# Patient Record
Sex: Male | Born: 1960 | Race: White | Hispanic: No | Marital: Married | Smoking: Former smoker
Health system: Southern US, Community
[De-identification: ages and names within clinical notes are randomized; demographics above are authoritative.]

## PROBLEM LIST (undated history)

## (undated) DIAGNOSIS — F1010 Alcohol abuse, uncomplicated: Secondary | ICD-10-CM

## (undated) DIAGNOSIS — R569 Unspecified convulsions: Secondary | ICD-10-CM

## (undated) DIAGNOSIS — I8500 Esophageal varices without bleeding: Secondary | ICD-10-CM

## (undated) DIAGNOSIS — Z8719 Personal history of other diseases of the digestive system: Secondary | ICD-10-CM

## (undated) DIAGNOSIS — K219 Gastro-esophageal reflux disease without esophagitis: Secondary | ICD-10-CM

## (undated) DIAGNOSIS — I700 Atherosclerosis of aorta: Secondary | ICD-10-CM

## (undated) DIAGNOSIS — K7030 Alcoholic cirrhosis of liver without ascites: Secondary | ICD-10-CM

## (undated) DIAGNOSIS — H9191 Unspecified hearing loss, right ear: Secondary | ICD-10-CM

## (undated) DIAGNOSIS — Z87898 Personal history of other specified conditions: Secondary | ICD-10-CM

## (undated) DIAGNOSIS — A048 Other specified bacterial intestinal infections: Secondary | ICD-10-CM

## (undated) DIAGNOSIS — U071 COVID-19: Secondary | ICD-10-CM

## (undated) DIAGNOSIS — M1990 Unspecified osteoarthritis, unspecified site: Secondary | ICD-10-CM

## (undated) DIAGNOSIS — F1020 Alcohol dependence, uncomplicated: Secondary | ICD-10-CM

## (undated) DIAGNOSIS — R188 Other ascites: Secondary | ICD-10-CM

## (undated) DIAGNOSIS — R748 Abnormal levels of other serum enzymes: Secondary | ICD-10-CM

## (undated) DIAGNOSIS — F419 Anxiety disorder, unspecified: Secondary | ICD-10-CM

## (undated) DIAGNOSIS — E669 Obesity, unspecified: Secondary | ICD-10-CM

## (undated) DIAGNOSIS — F32A Depression, unspecified: Secondary | ICD-10-CM

## (undated) DIAGNOSIS — I10 Essential (primary) hypertension: Secondary | ICD-10-CM

## (undated) DIAGNOSIS — E881 Lipodystrophy, not elsewhere classified: Secondary | ICD-10-CM

## (undated) DIAGNOSIS — M109 Gout, unspecified: Secondary | ICD-10-CM

## (undated) DIAGNOSIS — I639 Cerebral infarction, unspecified: Secondary | ICD-10-CM

## (undated) DIAGNOSIS — K859 Acute pancreatitis without necrosis or infection, unspecified: Secondary | ICD-10-CM

## (undated) DIAGNOSIS — IMO0001 Reserved for inherently not codable concepts without codable children: Secondary | ICD-10-CM

## (undated) DIAGNOSIS — E119 Type 2 diabetes mellitus without complications: Secondary | ICD-10-CM

## (undated) DIAGNOSIS — K4090 Unilateral inguinal hernia, without obstruction or gangrene, not specified as recurrent: Secondary | ICD-10-CM

## (undated) DIAGNOSIS — R0600 Dyspnea, unspecified: Secondary | ICD-10-CM

## (undated) DIAGNOSIS — I864 Gastric varices: Secondary | ICD-10-CM

## (undated) DIAGNOSIS — E871 Hypo-osmolality and hyponatremia: Secondary | ICD-10-CM

## (undated) DIAGNOSIS — Z87442 Personal history of urinary calculi: Secondary | ICD-10-CM

## (undated) DIAGNOSIS — D509 Iron deficiency anemia, unspecified: Secondary | ICD-10-CM

## (undated) DIAGNOSIS — K861 Other chronic pancreatitis: Secondary | ICD-10-CM

## (undated) DIAGNOSIS — K2970 Gastritis, unspecified, without bleeding: Secondary | ICD-10-CM

## (undated) DIAGNOSIS — C801 Malignant (primary) neoplasm, unspecified: Secondary | ICD-10-CM

## (undated) DIAGNOSIS — K635 Polyp of colon: Secondary | ICD-10-CM

## (undated) DIAGNOSIS — Z531 Procedure and treatment not carried out because of patient's decision for reasons of belief and group pressure: Secondary | ICD-10-CM

## (undated) DIAGNOSIS — M5030 Other cervical disc degeneration, unspecified cervical region: Secondary | ICD-10-CM

## (undated) DIAGNOSIS — R1310 Dysphagia, unspecified: Secondary | ICD-10-CM

## (undated) DIAGNOSIS — K209 Esophagitis, unspecified without bleeding: Secondary | ICD-10-CM

## (undated) DIAGNOSIS — E559 Vitamin D deficiency, unspecified: Secondary | ICD-10-CM

## (undated) DIAGNOSIS — R161 Splenomegaly, not elsewhere classified: Secondary | ICD-10-CM

## (undated) DIAGNOSIS — E875 Hyperkalemia: Secondary | ICD-10-CM

## (undated) DIAGNOSIS — E785 Hyperlipidemia, unspecified: Secondary | ICD-10-CM

## (undated) DIAGNOSIS — K439 Ventral hernia without obstruction or gangrene: Secondary | ICD-10-CM

## (undated) DIAGNOSIS — I82890 Acute embolism and thrombosis of other specified veins: Secondary | ICD-10-CM

## (undated) DIAGNOSIS — N529 Male erectile dysfunction, unspecified: Secondary | ICD-10-CM

## (undated) HISTORY — PX: COLONOSCOPY WITH ESOPHAGOGASTRODUODENOSCOPY (EGD): SHX5779

## (undated) HISTORY — PX: APPENDECTOMY: SHX54

## (undated) HISTORY — DX: Cerebral infarction, unspecified: I63.9

## (undated) HISTORY — PX: TONSILLECTOMY: SUR1361

## (undated) HISTORY — DX: Hyperlipidemia, unspecified: E78.5

## (undated) HISTORY — PX: HERNIA REPAIR: SHX51

## (undated) HISTORY — DX: Procedure and treatment not carried out because of patient's decision for reasons of belief and group pressure: Z53.1

---

## 1998-07-09 DIAGNOSIS — I10 Essential (primary) hypertension: Secondary | ICD-10-CM | POA: Insufficient documentation

## 2001-05-09 ENCOUNTER — Other Ambulatory Visit: Admission: RE | Admit: 2001-05-09 | Discharge: 2001-05-09 | Payer: Self-pay | Admitting: Gastroenterology

## 2004-07-09 HISTORY — PX: CHOLECYSTECTOMY: SHX55

## 2004-11-22 ENCOUNTER — Ambulatory Visit: Payer: Self-pay | Admitting: Family Medicine

## 2005-01-25 ENCOUNTER — Ambulatory Visit: Payer: Self-pay | Admitting: Internal Medicine

## 2005-07-09 DIAGNOSIS — E669 Obesity, unspecified: Secondary | ICD-10-CM | POA: Insufficient documentation

## 2006-04-10 ENCOUNTER — Ambulatory Visit: Payer: Self-pay | Admitting: Family Medicine

## 2006-07-14 ENCOUNTER — Emergency Department: Payer: Self-pay | Admitting: Emergency Medicine

## 2006-07-22 ENCOUNTER — Ambulatory Visit: Payer: Self-pay | Admitting: Family Medicine

## 2007-05-13 DIAGNOSIS — N529 Male erectile dysfunction, unspecified: Secondary | ICD-10-CM | POA: Insufficient documentation

## 2007-05-13 DIAGNOSIS — F329 Major depressive disorder, single episode, unspecified: Secondary | ICD-10-CM

## 2007-05-13 DIAGNOSIS — F32A Depression, unspecified: Secondary | ICD-10-CM | POA: Insufficient documentation

## 2007-07-14 ENCOUNTER — Other Ambulatory Visit: Payer: Self-pay

## 2007-07-14 ENCOUNTER — Ambulatory Visit: Payer: Self-pay | Admitting: Family Medicine

## 2007-07-14 ENCOUNTER — Inpatient Hospital Stay: Payer: Self-pay | Admitting: *Deleted

## 2007-07-22 ENCOUNTER — Emergency Department: Payer: Self-pay | Admitting: Internal Medicine

## 2007-07-22 ENCOUNTER — Other Ambulatory Visit: Payer: Self-pay

## 2007-09-10 DIAGNOSIS — E881 Lipodystrophy, not elsewhere classified: Secondary | ICD-10-CM | POA: Insufficient documentation

## 2007-09-10 DIAGNOSIS — M109 Gout, unspecified: Secondary | ICD-10-CM | POA: Insufficient documentation

## 2007-10-01 DIAGNOSIS — E1165 Type 2 diabetes mellitus with hyperglycemia: Secondary | ICD-10-CM | POA: Insufficient documentation

## 2007-10-01 DIAGNOSIS — E119 Type 2 diabetes mellitus without complications: Secondary | ICD-10-CM

## 2009-08-19 DIAGNOSIS — E559 Vitamin D deficiency, unspecified: Secondary | ICD-10-CM | POA: Insufficient documentation

## 2011-08-06 ENCOUNTER — Ambulatory Visit: Payer: Self-pay | Admitting: Family Medicine

## 2011-08-17 DIAGNOSIS — M5030 Other cervical disc degeneration, unspecified cervical region: Secondary | ICD-10-CM | POA: Insufficient documentation

## 2012-08-01 ENCOUNTER — Inpatient Hospital Stay: Payer: Self-pay | Admitting: Internal Medicine

## 2012-08-01 ENCOUNTER — Other Ambulatory Visit: Payer: Self-pay | Admitting: Family Medicine

## 2012-08-01 LAB — CBC WITH DIFFERENTIAL/PLATELET
Basophil #: 0.1 10*3/uL (ref 0.0–0.1)
Basophil %: 0.7 %
Eosinophil #: 0.1 10*3/uL (ref 0.0–0.7)
Eosinophil %: 0.5 %
Lymphocyte #: 0.9 10*3/uL — ABNORMAL LOW (ref 1.0–3.6)
Lymphocyte %: 5.5 %
MCH: 33.2 pg (ref 26.0–34.0)
MCHC: 34.2 g/dL (ref 32.0–36.0)
MCV: 97 fL (ref 80–100)
Monocyte %: 8.3 %
Neutrophil #: 14.4 10*3/uL — ABNORMAL HIGH (ref 1.4–6.5)
RBC: 4.66 10*6/uL (ref 4.40–5.90)
RDW: 12.9 % (ref 11.5–14.5)
WBC: 17 10*3/uL — ABNORMAL HIGH (ref 3.8–10.6)

## 2012-08-01 LAB — HEPATIC FUNCTION PANEL A (ARMC)
Albumin: 3.6 g/dL (ref 3.4–5.0)
Bilirubin,Total: 1.1 mg/dL — ABNORMAL HIGH (ref 0.2–1.0)
SGPT (ALT): 30 U/L (ref 12–78)
Total Protein: 8.1 g/dL (ref 6.4–8.2)

## 2012-08-01 LAB — URINALYSIS, COMPLETE
Bacteria: NONE SEEN
Blood: NEGATIVE
Glucose,UR: >=500 mg/dL (ref 0–75)
Leukocyte Esterase: NEGATIVE
Ph: 6 (ref 4.5–8.0)
Protein: NEGATIVE
Specific Gravity: 1.039 (ref 1.003–1.030)

## 2012-08-01 LAB — BASIC METABOLIC PANEL
Calcium, Total: 9 mg/dL (ref 8.5–10.1)
Chloride: 87 mmol/L — ABNORMAL LOW (ref 98–107)
Co2: 31 mmol/L (ref 21–32)
Glucose: 413 mg/dL — ABNORMAL HIGH (ref 65–99)
Potassium: 3.6 mmol/L (ref 3.5–5.1)
Sodium: 126 mmol/L — ABNORMAL LOW (ref 136–145)

## 2012-08-01 LAB — CK TOTAL AND CKMB (NOT AT ARMC): CK, Total: 16 U/L — ABNORMAL LOW (ref 35–232)

## 2012-08-01 LAB — LIPASE, BLOOD: Lipase: 942 U/L — ABNORMAL HIGH (ref 73–393)

## 2012-08-01 LAB — TROPONIN I: Troponin-I: <0.02 ng/mL

## 2012-08-02 LAB — CBC WITH DIFFERENTIAL/PLATELET
Basophil #: 0 10*3/uL (ref 0.0–0.1)
Eosinophil #: 0.2 10*3/uL (ref 0.0–0.7)
Eosinophil %: 1.5 %
HCT: 39.5 % — ABNORMAL LOW (ref 40.0–52.0)
HGB: 14.1 g/dL (ref 13.0–18.0)
MCHC: 35.6 g/dL (ref 32.0–36.0)
Monocyte #: 1.5 x10 3/mm — ABNORMAL HIGH (ref 0.2–1.0)
Neutrophil %: 74.7 %
WBC: 12.4 10*3/uL — ABNORMAL HIGH (ref 3.8–10.6)

## 2012-08-02 LAB — COMPREHENSIVE METABOLIC PANEL
Anion Gap: 9 (ref 7–16)
Bilirubin,Total: 0.8 mg/dL (ref 0.2–1.0)
Calcium, Total: 8.8 mg/dL (ref 8.5–10.1)
Chloride: 94 mmol/L — ABNORMAL LOW (ref 98–107)
Creatinine: 0.59 mg/dL — ABNORMAL LOW (ref 0.60–1.30)
EGFR (African American): >60
EGFR (Non-African Amer.): >60
Glucose: 157 mg/dL — ABNORMAL HIGH (ref 65–99)
Osmolality: 269 (ref 275–301)
Potassium: 4.4 mmol/L (ref 3.5–5.1)
SGPT (ALT): 26 U/L (ref 12–78)
Sodium: 134 mmol/L — ABNORMAL LOW (ref 136–145)
Total Protein: 6.9 g/dL (ref 6.4–8.2)

## 2012-08-02 LAB — LIPASE, BLOOD: Lipase: 322 U/L (ref 73–393)

## 2012-08-03 LAB — SODIUM: Sodium: 137 mmol/L (ref 136–145)

## 2012-09-06 ENCOUNTER — Inpatient Hospital Stay: Payer: Self-pay | Admitting: Internal Medicine

## 2012-09-06 LAB — URINALYSIS, COMPLETE
Bacteria: NONE SEEN
Bilirubin,UR: NEGATIVE
Glucose,UR: >=500 mg/dL (ref 0–75)
Ph: 5 (ref 4.5–8.0)
Protein: NEGATIVE
RBC,UR: NONE SEEN /HPF (ref 0–5)
WBC UR: <1 /HPF (ref 0–5)

## 2012-09-06 LAB — COMPREHENSIVE METABOLIC PANEL
Albumin: 3.6 g/dL (ref 3.4–5.0)
Alkaline Phosphatase: 164 U/L — ABNORMAL HIGH (ref 50–136)
BUN: 29 mg/dL — ABNORMAL HIGH (ref 7–18)
Bilirubin,Total: 1.6 mg/dL — ABNORMAL HIGH (ref 0.2–1.0)
EGFR (African American): >60
EGFR (Non-African Amer.): >60
Glucose: 611 mg/dL (ref 65–99)
Potassium: 4.3 mmol/L (ref 3.5–5.1)
Sodium: 121 mmol/L — ABNORMAL LOW (ref 136–145)
Total Protein: 7.6 g/dL (ref 6.4–8.2)

## 2012-09-06 LAB — CBC
HCT: 40.2 % (ref 40.0–52.0)
MCH: 32.6 pg (ref 26.0–34.0)
MCHC: 34.5 g/dL (ref 32.0–36.0)
MCV: 94 fL (ref 80–100)
Platelet: 205 10*3/uL (ref 150–440)
RDW: 13.1 % (ref 11.5–14.5)

## 2012-09-06 LAB — HEMOGLOBIN A1C: Hemoglobin A1C: 12.2 % — ABNORMAL HIGH (ref 4.2–6.3)

## 2012-09-06 LAB — AMYLASE: Amylase: 58 U/L (ref 25–115)

## 2012-09-07 LAB — CBC WITH DIFFERENTIAL/PLATELET
Basophil #: 0.1 10*3/uL (ref 0.0–0.1)
Eosinophil %: 9.4 %
HGB: 12.7 g/dL — ABNORMAL LOW (ref 13.0–18.0)
Lymphocyte #: 1.4 10*3/uL (ref 1.0–3.6)
MCV: 92 fL (ref 80–100)
Monocyte %: 11.5 %
Neutrophil #: 3.9 10*3/uL (ref 1.4–6.5)
Platelet: 183 10*3/uL (ref 150–440)
RBC: 3.92 10*6/uL — ABNORMAL LOW (ref 4.40–5.90)
RDW: 13.1 % (ref 11.5–14.5)
WBC: 6.8 10*3/uL (ref 3.8–10.6)

## 2012-09-07 LAB — LIPID PANEL: Triglycerides: 116 mg/dL (ref 0–200)

## 2012-09-07 LAB — BASIC METABOLIC PANEL
Anion Gap: 6 — ABNORMAL LOW (ref 7–16)
Co2: 32 mmol/L (ref 21–32)
Creatinine: 0.65 mg/dL (ref 0.60–1.30)
Glucose: 175 mg/dL — ABNORMAL HIGH (ref 65–99)

## 2012-09-07 LAB — HEMOGLOBIN A1C: Hemoglobin A1C: 11.3 % — ABNORMAL HIGH (ref 4.2–6.3)

## 2012-09-08 LAB — BASIC METABOLIC PANEL
Anion Gap: 4 — ABNORMAL LOW (ref 7–16)
Chloride: 100 mmol/L (ref 98–107)
Creatinine: 0.64 mg/dL (ref 0.60–1.30)
EGFR (African American): >60
EGFR (Non-African Amer.): >60
Glucose: 179 mg/dL — ABNORMAL HIGH (ref 65–99)
Osmolality: 273 (ref 275–301)
Potassium: 3.9 mmol/L (ref 3.5–5.1)
Sodium: 135 mmol/L — ABNORMAL LOW (ref 136–145)

## 2012-09-08 LAB — MAGNESIUM: Magnesium: 1.6 mg/dL — ABNORMAL LOW

## 2012-09-09 DIAGNOSIS — I8500 Esophageal varices without bleeding: Secondary | ICD-10-CM | POA: Insufficient documentation

## 2012-09-09 LAB — BASIC METABOLIC PANEL
Anion Gap: 3 — ABNORMAL LOW (ref 7–16)
BUN: 8 mg/dL (ref 7–18)
Calcium, Total: 9.1 mg/dL (ref 8.5–10.1)
Chloride: 101 mmol/L (ref 98–107)
Co2: 31 mmol/L (ref 21–32)
Creatinine: 0.7 mg/dL (ref 0.60–1.30)
Potassium: 3.6 mmol/L (ref 3.5–5.1)

## 2012-09-09 LAB — CEA: CEA: 2 ng/mL (ref 0.0–4.7)

## 2012-09-10 LAB — PATHOLOGY REPORT

## 2012-09-23 ENCOUNTER — Ambulatory Visit: Payer: Self-pay | Admitting: Unknown Physician Specialty

## 2012-09-23 DIAGNOSIS — Z86010 Personal history of colonic polyps: Secondary | ICD-10-CM | POA: Insufficient documentation

## 2012-09-24 LAB — PATHOLOGY REPORT

## 2013-02-26 ENCOUNTER — Ambulatory Visit: Payer: Self-pay | Admitting: Gastroenterology

## 2013-07-20 ENCOUNTER — Ambulatory Visit: Payer: Self-pay | Admitting: Surgery

## 2013-07-20 LAB — CBC WITH DIFFERENTIAL/PLATELET
BASOS ABS: 0.1 10*3/uL (ref 0.0–0.1)
Basophil %: 0.9 %
Eosinophil #: 0.2 10*3/uL (ref 0.0–0.7)
Eosinophil %: 3.7 %
HCT: 43.3 % (ref 40.0–52.0)
HGB: 14.9 g/dL (ref 13.0–18.0)
Lymphocyte #: 1 10*3/uL (ref 1.0–3.6)
Lymphocyte %: 14.6 %
MCH: 31.9 pg (ref 26.0–34.0)
MCHC: 34.3 g/dL (ref 32.0–36.0)
MCV: 93 fL (ref 80–100)
MONOS PCT: 9.4 %
Monocyte #: 0.6 x10 3/mm (ref 0.2–1.0)
Neutrophil #: 4.7 10*3/uL (ref 1.4–6.5)
Neutrophil %: 71.4 %
PLATELETS: 215 10*3/uL (ref 150–440)
RBC: 4.66 10*6/uL (ref 4.40–5.90)
RDW: 15.5 % — AB (ref 11.5–14.5)
WBC: 6.6 10*3/uL (ref 3.8–10.6)

## 2013-07-20 LAB — BASIC METABOLIC PANEL
Anion Gap: 6 — ABNORMAL LOW (ref 7–16)
BUN: 14 mg/dL (ref 7–18)
CALCIUM: 9.3 mg/dL (ref 8.5–10.1)
CHLORIDE: 99 mmol/L (ref 98–107)
CREATININE: 0.65 mg/dL (ref 0.60–1.30)
Co2: 32 mmol/L (ref 21–32)
EGFR (African American): >60
EGFR (Non-African Amer.): >60
Glucose: 179 mg/dL — ABNORMAL HIGH (ref 65–99)
OSMOLALITY: 279 (ref 275–301)
POTASSIUM: 4.3 mmol/L (ref 3.5–5.1)
Sodium: 137 mmol/L (ref 136–145)

## 2013-07-20 LAB — PROTIME-INR
INR: 1.1
PROTHROMBIN TIME: 14 s (ref 11.5–14.7)

## 2013-07-28 ENCOUNTER — Ambulatory Visit: Payer: Self-pay | Admitting: Surgery

## 2013-07-28 HISTORY — PX: HERNIA REPAIR: SHX51

## 2013-09-24 ENCOUNTER — Ambulatory Visit: Payer: Self-pay | Admitting: Gastroenterology

## 2013-11-06 ENCOUNTER — Ambulatory Visit: Payer: Self-pay | Admitting: Gastroenterology

## 2013-11-06 LAB — CBC WITH DIFFERENTIAL/PLATELET
BASOS PCT: 1.8 %
Basophil #: 0.1 10*3/uL (ref 0.0–0.1)
Eosinophil #: 0.2 10*3/uL (ref 0.0–0.7)
Eosinophil %: 5.1 %
HCT: 25.8 % — AB (ref 40.0–52.0)
HGB: 8.2 g/dL — ABNORMAL LOW (ref 13.0–18.0)
LYMPHS PCT: 19 %
Lymphocyte #: 0.8 10*3/uL — ABNORMAL LOW (ref 1.0–3.6)
MCH: 24.1 pg — AB (ref 26.0–34.0)
MCHC: 31.8 g/dL — ABNORMAL LOW (ref 32.0–36.0)
MCV: 76 fL — ABNORMAL LOW (ref 80–100)
Monocyte #: 0.6 x10 3/mm (ref 0.2–1.0)
Monocyte %: 13.1 %
NEUTROS PCT: 61 %
Neutrophil #: 2.6 10*3/uL (ref 1.4–6.5)
Platelet: 254 10*3/uL (ref 150–440)
RBC: 3.39 10*6/uL — AB (ref 4.40–5.90)
RDW: 22.8 % — AB (ref 11.5–14.5)
WBC: 4.3 10*3/uL (ref 3.8–10.6)

## 2013-11-06 LAB — HM COLONOSCOPY

## 2013-11-06 LAB — PROTIME-INR
INR: 1.1
PROTHROMBIN TIME: 14.4 s (ref 11.5–14.7)

## 2013-11-06 LAB — KOH PREP

## 2013-11-09 LAB — PATHOLOGY REPORT

## 2013-12-08 DIAGNOSIS — K209 Esophagitis, unspecified without bleeding: Secondary | ICD-10-CM | POA: Insufficient documentation

## 2013-12-08 DIAGNOSIS — K2970 Gastritis, unspecified, without bleeding: Secondary | ICD-10-CM | POA: Insufficient documentation

## 2013-12-08 DIAGNOSIS — R748 Abnormal levels of other serum enzymes: Secondary | ICD-10-CM | POA: Insufficient documentation

## 2013-12-08 DIAGNOSIS — K861 Other chronic pancreatitis: Secondary | ICD-10-CM | POA: Insufficient documentation

## 2013-12-09 ENCOUNTER — Ambulatory Visit: Payer: Self-pay | Admitting: Internal Medicine

## 2013-12-09 ENCOUNTER — Ambulatory Visit: Payer: Self-pay | Admitting: Gastroenterology

## 2013-12-09 LAB — CBC CANCER CENTER
Basophil #: 0.1 x10 3/mm (ref 0.0–0.1)
Basophil %: 1.6 %
Eosinophil #: 0.2 x10 3/mm (ref 0.0–0.7)
Eosinophil %: 3.9 %
HCT: 24.8 % — AB (ref 40.0–52.0)
HGB: 7.3 g/dL — AB (ref 13.0–18.0)
Lymphocyte #: 0.7 x10 3/mm — ABNORMAL LOW (ref 1.0–3.6)
Lymphocyte %: 11.5 %
MCH: 19.9 pg — ABNORMAL LOW (ref 26.0–34.0)
MCHC: 29.3 g/dL — ABNORMAL LOW (ref 32.0–36.0)
MCV: 68 fL — AB (ref 80–100)
MONO ABS: 0.9 x10 3/mm (ref 0.2–1.0)
Monocyte %: 13.6 %
Neutrophil #: 4.4 x10 3/mm (ref 1.4–6.5)
Neutrophil %: 69.4 %
Platelet: 284 x10 3/mm (ref 150–440)
RBC: 3.65 10*6/uL — ABNORMAL LOW (ref 4.40–5.90)
RDW: 20.2 % — ABNORMAL HIGH (ref 11.5–14.5)
WBC: 6.3 x10 3/mm (ref 3.8–10.6)

## 2013-12-09 LAB — FERRITIN: Ferritin (ARMC): 16 ng/mL (ref 8–388)

## 2013-12-15 ENCOUNTER — Ambulatory Visit: Payer: Self-pay | Admitting: Gastroenterology

## 2013-12-16 LAB — CBC CANCER CENTER
BASOS ABS: 0.1 x10 3/mm (ref 0.0–0.1)
Basophil %: 0.9 %
EOS PCT: 4.2 %
Eosinophil #: 0.5 x10 3/mm (ref 0.0–0.7)
HCT: 31 % — ABNORMAL LOW (ref 40.0–52.0)
HGB: 9.3 g/dL — ABNORMAL LOW (ref 13.0–18.0)
LYMPHS ABS: 1.3 x10 3/mm (ref 1.0–3.6)
Lymphocyte %: 12.3 %
MCH: 21.9 pg — ABNORMAL LOW (ref 26.0–34.0)
MCHC: 30.1 g/dL — AB (ref 32.0–36.0)
MCV: 73 fL — AB (ref 80–100)
MONO ABS: 1.2 x10 3/mm — AB (ref 0.2–1.0)
MONOS PCT: 11.1 %
NEUTROS PCT: 71.5 %
Neutrophil #: 7.8 x10 3/mm — ABNORMAL HIGH (ref 1.4–6.5)
Platelet: 275 x10 3/mm (ref 150–440)
RBC: 4.25 10*6/uL — ABNORMAL LOW (ref 4.40–5.90)
RDW: 24.2 % — ABNORMAL HIGH (ref 11.5–14.5)
WBC: 10.9 x10 3/mm — ABNORMAL HIGH (ref 3.8–10.6)

## 2013-12-23 LAB — FERRITIN: FERRITIN (ARMC): 47 ng/mL (ref 8–388)

## 2013-12-23 LAB — CBC CANCER CENTER
Basophil #: 0.2 x10 3/mm — ABNORMAL HIGH (ref 0.0–0.1)
Basophil %: 2.2 %
Eosinophil #: 0.4 x10 3/mm (ref 0.0–0.7)
Eosinophil %: 5.3 %
HCT: 30.1 % — ABNORMAL LOW (ref 40.0–52.0)
HGB: 9.2 g/dL — ABNORMAL LOW (ref 13.0–18.0)
Lymphocyte #: 1.2 x10 3/mm (ref 1.0–3.6)
Lymphocyte %: 17.4 %
MCH: 22 pg — ABNORMAL LOW (ref 26.0–34.0)
MCHC: 30.4 g/dL — ABNORMAL LOW (ref 32.0–36.0)
MCV: 72 fL — ABNORMAL LOW (ref 80–100)
MONO ABS: 0.8 x10 3/mm (ref 0.2–1.0)
MONOS PCT: 11.6 %
Neutrophil #: 4.4 x10 3/mm (ref 1.4–6.5)
Neutrophil %: 63.5 %
Platelet: 382 x10 3/mm (ref 150–440)
RBC: 4.17 10*6/uL — ABNORMAL LOW (ref 4.40–5.90)
RDW: 24.9 % — ABNORMAL HIGH (ref 11.5–14.5)
WBC: 7 x10 3/mm (ref 3.8–10.6)

## 2013-12-25 ENCOUNTER — Ambulatory Visit: Payer: Self-pay | Admitting: Gastroenterology

## 2013-12-30 LAB — CANCER CENTER HEMOGLOBIN: HGB: 9.8 g/dL — ABNORMAL LOW (ref 13.0–18.0)

## 2013-12-30 LAB — FERRITIN: Ferritin (ARMC): 48 ng/mL (ref 8–388)

## 2014-01-06 ENCOUNTER — Ambulatory Visit: Payer: Self-pay | Admitting: Internal Medicine

## 2014-01-06 LAB — CANCER CENTER HEMOGLOBIN: HGB: 10.3 g/dL — ABNORMAL LOW (ref 13.0–18.0)

## 2014-01-06 LAB — FERRITIN: Ferritin (ARMC): 40 ng/mL (ref 8–388)

## 2014-01-20 LAB — CANCER CENTER HEMOGLOBIN: HGB: 11.4 g/dL — AB (ref 13.0–18.0)

## 2014-01-20 LAB — FERRITIN: Ferritin (ARMC): 111 ng/mL (ref 8–388)

## 2014-02-06 ENCOUNTER — Ambulatory Visit: Payer: Self-pay | Admitting: Internal Medicine

## 2014-02-10 DIAGNOSIS — R1310 Dysphagia, unspecified: Secondary | ICD-10-CM | POA: Insufficient documentation

## 2014-02-11 DIAGNOSIS — D689 Coagulation defect, unspecified: Secondary | ICD-10-CM | POA: Insufficient documentation

## 2014-02-23 ENCOUNTER — Other Ambulatory Visit: Payer: Self-pay | Admitting: Family Medicine

## 2014-02-23 LAB — COMPREHENSIVE METABOLIC PANEL
ALBUMIN: 3.6 g/dL (ref 3.4–5.0)
ANION GAP: 8 (ref 7–16)
Alkaline Phosphatase: 177 U/L — ABNORMAL HIGH
BUN: 10 mg/dL (ref 7–18)
Bilirubin,Total: 1.5 mg/dL — ABNORMAL HIGH (ref 0.2–1.0)
CALCIUM: 9.3 mg/dL (ref 8.5–10.1)
CREATININE: 0.99 mg/dL (ref 0.60–1.30)
Chloride: 96 mmol/L — ABNORMAL LOW (ref 98–107)
Co2: 33 mmol/L — ABNORMAL HIGH (ref 21–32)
GLUCOSE: 191 mg/dL — AB (ref 65–99)
Osmolality: 278 (ref 275–301)
Potassium: 5.2 mmol/L — ABNORMAL HIGH (ref 3.5–5.1)
SGOT(AST): 35 U/L (ref 15–37)
SGPT (ALT): 18 U/L
SODIUM: 137 mmol/L (ref 136–145)
Total Protein: 7.8 g/dL (ref 6.4–8.2)

## 2014-02-23 LAB — CBC WITH DIFFERENTIAL/PLATELET
BASOS PCT: 0.4 %
Basophil #: 0.1 10*3/uL (ref 0.0–0.1)
EOS ABS: 0 10*3/uL (ref 0.0–0.7)
Eosinophil %: 0.1 %
HCT: 44.7 % (ref 40.0–52.0)
HGB: 14.5 g/dL (ref 13.0–18.0)
LYMPHS ABS: 0.7 10*3/uL — AB (ref 1.0–3.6)
Lymphocyte %: 4.3 %
MCH: 27.8 pg (ref 26.0–34.0)
MCHC: 32.4 g/dL (ref 32.0–36.0)
MCV: 86 fL (ref 80–100)
Monocyte #: 1.2 x10 3/mm — ABNORMAL HIGH (ref 0.2–1.0)
Monocyte %: 7.3 %
NEUTROS PCT: 87.9 %
Neutrophil #: 14.5 10*3/uL — ABNORMAL HIGH (ref 1.4–6.5)
PLATELETS: 347 10*3/uL (ref 150–440)
RBC: 5.21 10*6/uL (ref 4.40–5.90)
RDW: 22.9 % — AB (ref 11.5–14.5)
WBC: 16.5 10*3/uL — AB (ref 3.8–10.6)

## 2014-02-23 LAB — AMYLASE: Amylase: 446 U/L — ABNORMAL HIGH (ref 25–115)

## 2014-02-24 ENCOUNTER — Other Ambulatory Visit: Payer: Self-pay | Admitting: Family Medicine

## 2014-02-24 LAB — CBC WITH DIFFERENTIAL/PLATELET
BASOS ABS: 0.1 10*3/uL (ref 0.0–0.1)
Basophil %: 0.3 %
EOS PCT: 0.2 %
Eosinophil #: 0 10*3/uL (ref 0.0–0.7)
HCT: 43.4 % (ref 40.0–52.0)
HGB: 13.8 g/dL (ref 13.0–18.0)
LYMPHS ABS: 1.3 10*3/uL (ref 1.0–3.6)
Lymphocyte %: 6.8 %
MCH: 27.4 pg (ref 26.0–34.0)
MCHC: 31.8 g/dL — ABNORMAL LOW (ref 32.0–36.0)
MCV: 86 fL (ref 80–100)
MONOS PCT: 9.1 %
Monocyte #: 1.7 x10 3/mm — ABNORMAL HIGH (ref 0.2–1.0)
NEUTROS ABS: 16 10*3/uL — AB (ref 1.4–6.5)
NEUTROS PCT: 83.6 %
Platelet: 377 10*3/uL (ref 150–440)
RBC: 5.03 10*6/uL (ref 4.40–5.90)
RDW: 23.2 % — AB (ref 11.5–14.5)
WBC: 19.2 10*3/uL — ABNORMAL HIGH (ref 3.8–10.6)

## 2014-02-24 LAB — CK TOTAL AND CKMB (NOT AT ARMC)
CK, Total: 402 U/L — ABNORMAL HIGH
CK-MB: 5.6 ng/mL — AB (ref 0.5–3.6)

## 2014-02-24 LAB — URINALYSIS, COMPLETE
BACTERIA: NONE SEEN
Bilirubin,UR: NEGATIVE
Glucose,UR: NEGATIVE mg/dL (ref 0–75)
Leukocyte Esterase: NEGATIVE
NITRITE: NEGATIVE
PH: 6 (ref 4.5–8.0)
Protein: NEGATIVE
SPECIFIC GRAVITY: 1.009 (ref 1.003–1.030)
Squamous Epithelial: NONE SEEN
WBC UR: 1 /HPF (ref 0–5)

## 2014-02-24 LAB — LACTATE DEHYDROGENASE: LDH: 274 U/L — AB (ref 85–241)

## 2014-02-24 LAB — COMPREHENSIVE METABOLIC PANEL
ALBUMIN: 3.3 g/dL — AB (ref 3.4–5.0)
ANION GAP: 9 (ref 7–16)
Alkaline Phosphatase: 142 U/L — ABNORMAL HIGH
BILIRUBIN TOTAL: 1.3 mg/dL — AB (ref 0.2–1.0)
BUN: 13 mg/dL (ref 7–18)
Calcium, Total: 8.7 mg/dL (ref 8.5–10.1)
Chloride: 93 mmol/L — ABNORMAL LOW (ref 98–107)
Co2: 30 mmol/L (ref 21–32)
Creatinine: 1.06 mg/dL (ref 0.60–1.30)
EGFR (African American): >60
EGFR (Non-African Amer.): >60
Glucose: 167 mg/dL — ABNORMAL HIGH (ref 65–99)
Osmolality: 268 (ref 275–301)
Potassium: 4.2 mmol/L (ref 3.5–5.1)
SGOT(AST): 32 U/L (ref 15–37)
SGPT (ALT): 17 U/L
Sodium: 132 mmol/L — ABNORMAL LOW (ref 136–145)
Total Protein: 7.2 g/dL (ref 6.4–8.2)

## 2014-02-24 LAB — CBC CANCER CENTER
BASOS ABS: 0.1 x10 3/mm (ref 0.0–0.1)
BASOS PCT: 0.4 %
EOS PCT: 0.2 %
Eosinophil #: 0 x10 3/mm (ref 0.0–0.7)
HCT: 44.1 % (ref 40.0–52.0)
HGB: 14.2 g/dL (ref 13.0–18.0)
Lymphocyte #: 1 x10 3/mm (ref 1.0–3.6)
Lymphocyte %: 5.4 %
MCH: 27.7 pg (ref 26.0–34.0)
MCHC: 32.1 g/dL (ref 32.0–36.0)
MCV: 86 fL (ref 80–100)
MONOS PCT: 7.7 %
Monocyte #: 1.5 x10 3/mm — ABNORMAL HIGH (ref 0.2–1.0)
Neutrophil #: 16.6 x10 3/mm — ABNORMAL HIGH (ref 1.4–6.5)
Neutrophil %: 86.3 %
Platelet: 388 x10 3/mm (ref 150–440)
RBC: 5.12 10*6/uL (ref 4.40–5.90)
RDW: 23.1 % — ABNORMAL HIGH (ref 11.5–14.5)
WBC: 19.3 x10 3/mm — ABNORMAL HIGH (ref 3.8–10.6)

## 2014-02-24 LAB — FERRITIN: Ferritin (ARMC): 237 ng/mL (ref 8–388)

## 2014-02-24 LAB — AMYLASE: AMYLASE: 328 U/L — AB (ref 25–115)

## 2014-02-24 LAB — IRON AND TIBC
IRON BIND. CAP.(TOTAL): 440 ug/dL (ref 250–450)
IRON SATURATION: 5 %
Iron: 22 ug/dL — ABNORMAL LOW (ref 65–175)
Unbound Iron-Bind.Cap.: 418 ug/dL

## 2014-02-24 LAB — TROPONIN I

## 2014-02-24 LAB — LIPASE, BLOOD: LIPASE: 1798 U/L — AB (ref 73–393)

## 2014-02-24 LAB — RETICULOCYTES
Absolute Retic Count: 0.0712 10*6/uL (ref 0.019–0.186)
Reticulocyte: 1.39 % (ref 0.4–3.1)

## 2014-02-25 ENCOUNTER — Inpatient Hospital Stay: Payer: Self-pay | Admitting: Internal Medicine

## 2014-02-25 LAB — CBC WITH DIFFERENTIAL/PLATELET
BASOS PCT: 0.5 %
Basophil #: 0 10*3/uL (ref 0.0–0.1)
EOS ABS: 0.2 10*3/uL (ref 0.0–0.7)
EOS PCT: 2.6 %
HCT: 33.5 % — ABNORMAL LOW (ref 40.0–52.0)
HGB: 10.9 g/dL — ABNORMAL LOW (ref 13.0–18.0)
Lymphocyte #: 1.4 10*3/uL (ref 1.0–3.6)
Lymphocyte %: 16.9 %
MCH: 28.3 pg (ref 26.0–34.0)
MCHC: 32.4 g/dL (ref 32.0–36.0)
MCV: 87 fL (ref 80–100)
Monocyte #: 0.9 x10 3/mm (ref 0.2–1.0)
Monocyte %: 11.1 %
Neutrophil #: 5.8 10*3/uL (ref 1.4–6.5)
Neutrophil %: 68.9 %
PLATELETS: 226 10*3/uL (ref 150–440)
RBC: 3.85 10*6/uL — AB (ref 4.40–5.90)
RDW: 22.4 % — ABNORMAL HIGH (ref 11.5–14.5)
WBC: 8.5 10*3/uL (ref 3.8–10.6)

## 2014-02-25 LAB — BASIC METABOLIC PANEL
ANION GAP: 4 — AB (ref 7–16)
BUN: 10 mg/dL (ref 7–18)
CALCIUM: 8.2 mg/dL — AB (ref 8.5–10.1)
CO2: 35 mmol/L — AB (ref 21–32)
CREATININE: 0.93 mg/dL (ref 0.60–1.30)
Chloride: 96 mmol/L — ABNORMAL LOW (ref 98–107)
EGFR (African American): >60
EGFR (Non-African Amer.): >60
Glucose: 115 mg/dL — ABNORMAL HIGH (ref 65–99)
Osmolality: 270 (ref 275–301)
POTASSIUM: 3.9 mmol/L (ref 3.5–5.1)
Sodium: 135 mmol/L — ABNORMAL LOW (ref 136–145)

## 2014-02-25 LAB — LIPASE, BLOOD: LIPASE: 761 U/L — AB (ref 73–393)

## 2014-02-25 LAB — MAGNESIUM: Magnesium: 1.9 mg/dL

## 2014-03-02 LAB — CULTURE, BLOOD (SINGLE)

## 2014-03-09 ENCOUNTER — Ambulatory Visit: Payer: Self-pay | Admitting: Internal Medicine

## 2014-04-27 ENCOUNTER — Ambulatory Visit: Payer: Self-pay | Admitting: Surgery

## 2014-04-27 LAB — CBC WITH DIFFERENTIAL/PLATELET
BASOS ABS: 0 10*3/uL (ref 0.0–0.1)
Basophil %: 0.7 %
EOS ABS: 0.2 10*3/uL (ref 0.0–0.7)
Eosinophil %: 3.4 %
HCT: 36 % — AB (ref 40.0–52.0)
HGB: 11.4 g/dL — ABNORMAL LOW (ref 13.0–18.0)
LYMPHS PCT: 17.6 %
Lymphocyte #: 1.1 10*3/uL (ref 1.0–3.6)
MCH: 27.6 pg (ref 26.0–34.0)
MCHC: 31.6 g/dL — AB (ref 32.0–36.0)
MCV: 87 fL (ref 80–100)
MONO ABS: 0.7 x10 3/mm (ref 0.2–1.0)
Monocyte %: 11.3 %
NEUTROS ABS: 4.2 10*3/uL (ref 1.4–6.5)
Neutrophil %: 67 %
PLATELETS: 279 10*3/uL (ref 150–440)
RBC: 4.12 10*6/uL — ABNORMAL LOW (ref 4.40–5.90)
RDW: 16.4 % — ABNORMAL HIGH (ref 11.5–14.5)
WBC: 6.3 10*3/uL (ref 3.8–10.6)

## 2014-04-27 LAB — BASIC METABOLIC PANEL
Anion Gap: 9 (ref 7–16)
BUN: 9 mg/dL (ref 7–18)
CALCIUM: 8.7 mg/dL (ref 8.5–10.1)
CREATININE: 0.78 mg/dL (ref 0.60–1.30)
Chloride: 97 mmol/L — ABNORMAL LOW (ref 98–107)
Co2: 30 mmol/L (ref 21–32)
EGFR (African American): >60
EGFR (Non-African Amer.): >60
GLUCOSE: 264 mg/dL — AB (ref 65–99)
Osmolality: 280 (ref 275–301)
Potassium: 4.5 mmol/L (ref 3.5–5.1)
Sodium: 136 mmol/L (ref 136–145)

## 2014-04-27 LAB — PROTIME-INR
INR: 1
PROTHROMBIN TIME: 13.2 s (ref 11.5–14.7)

## 2014-05-04 ENCOUNTER — Ambulatory Visit: Payer: Self-pay | Admitting: Surgery

## 2014-05-31 LAB — PSA: PSA: 0.7

## 2014-09-27 LAB — LIPID PANEL
Cholesterol: 138 mg/dL (ref 0–200)
HDL: 67 mg/dL (ref 35–70)
LDL CALC: 54 mg/dL
TRIGLYCERIDES: 86 mg/dL (ref 40–160)

## 2014-09-27 LAB — HEMOGLOBIN A1C: HEMOGLOBIN A1C: 6.4 % — AB (ref 4.0–6.0)

## 2014-10-29 NOTE — H&P (Signed)
PATIENT NAME:  [name], [name] MR#:  [id number] DATE OF BIRTH:  1950-12-14  DATE OF ADMISSION:  09/06/2012  REFERRING PHYSICIAN: Pollie Friar, MD  PRIMARY CARE PHYSICIAN: [name]. Fisher, MD  CHIEF COMPLAINT: Hyperglycemia and overall feeling "very bad."   HISTORY OF PRESENT ILLNESS: The patient is a very nice 64 year old gentleman who was recently admitted over here on 08/01/2012 due to abdominal pain and nausea secondary to pancreatitis. The patient was treated for that. He was found to have a cyst that at the beginning looked like an abscess, for what he was given antibiotics. He also was found to have a Helicobacter pylori infection, for what he was treated with antibiotics for 14 days. At that moment, the patient was able to recover. After his pancreatitis improved, he was able to be discharged home. Apparently he has not been able to follow a good diet, as the patient has been eating a lot of fat. He told me that he had hamburgers, chicken wings and other greasy foods within the last several days. He states that he "does not know why this keeps happening if he has been looking after his diet and not eating any spice." I was able to concentrate on the diet with the patient and explain to him that it is not the spice, what counts is the fat. The patient is having a very severe amount of fat in his diet. The patient comes today feeling really bad. He had some abdominal pain, nausea and vomiting several days ago, but they have resolved at this moment. He is not having any abdominal pain, although he has clear evidence of pancreatitis and his blood sugars are in the 600s. The patient is admitted for treatment of this condition.   REVIEW OF SYSTEMS: The patient denies any fever, fatigue. The patient feels weak. Denies any significant pain. He had a 3 to 4-pound weight loss within the last week.  EYES: No blurry vision, double vision or inflammation.  ENT: No tinnitus. No hearing loss. No significant  difficulty swallowing.  RESPIRATORY: No cough. No wheezing. No hemoptysis. No COPD.  CARDIOVASCULAR: No chest pain, orthopnea, syncope, palpitations or edema. GASTROINTESTINAL: The patient had some nausea, vomiting and diarrhea on and off. He states that he has a lot of undigested food in his stool, especially capsules and pills, and  sometimes fatty stools. The patient denies any hematochezia, rectal bleeding, hematemesis or melena. The patient has mild GERD.  GENITOURINARY: No dysuria, hematuria or changes in color of the urine. He says that he has been urinating a little bit more due to his hyperglycemia.  ENDOCRINE: Positive polyuria. No polydipsia or polyphagia. No cold or heat intolerance.  HEMATOLOGIC: No significant easy bruising, bleeding or swollen glands.  SKIN: Without any rashes or petechiae.  MUSCULOSKELETAL: No significant back pain, neck pain or extremity pain. No swelling or gout.  NEUROLOGIC: No CVA, TIA or numbness.  PSYCHIATRIC: No depression or anxiety. The patient used to drink heavily. He has not drank anything since mid January.   PAST MEDICAL HISTORY:  1.  Type 2 diabetes.  2.  GERD.  3.  Hypertension.  4.  Chronic alcoholism.  5.  Alcohol-induced pancreatitis.  6.  Now chronic pancreatitis.  7.  Recent infection with Helicobacter pylori.  8.  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   ALLERGIES: No known drug allergies.   CURRENT MEDICATIONS: Phenergan every 4 to 6 hours p.r.n., Percocet 5/325 every 4 hours p.r.n., Paxil 40 mg once daily,  omeprazole 20 mg once daily, metformin 500 mg once daily in the morning and 1000 mg at bedtime, lisinopril 10 mg once a day.   SOCIAL HISTORY: The patient is married and lives with his wife. He was a heavy drinker and he was using alcohol ongoing up until mid January. He does not smoke.   PAST SURGICAL HISTORY: Tonsillectomy and cholecystectomy.   FAMILY HISTORY: Positive for diabetes in his brother and positive for lung cancer  in his father. No coronary artery disease.   PHYSICAL EXAMINATION:  VITAL SIGNS: Blood pressure 107/59, pulse 97, respiratory rate 18, temperature 97.8, pulse oximetry 94% on room air. GENERAL: Alert and oriented x 3. No acute distress. No respiratory distress. Hemodynamically stable.  HEENT: Pupils are equal and reactive. Extraocular movements are intact. Mucosae are moist. Anicteric sclerae. Pink conjunctivae. No oral lesions. No oropharyngeal exudates.  NECK: Supple. No JVD. No thyromegaly. No adenopathy. No carotid bruits. No rigidity.  CARDIOVASCULAR: Regular rate and rhythm. No murmurs, rubs or gallops. No displacement of PMI. No tenderness to palpation of anterior chest wall.  LUNGS: Clear without any wheezing or crepitus. No use of accessory muscles.  ABDOMEN: Soft, nontender, nondistended. No hepatosplenomegaly. No masses. Bowel sounds are positive. The only tenderness that I can reproduce is whenever I do a deep palpation on the epigastric area, although it is not severe. No rebound tenderness. No guarding.  GENITAL: Deferred.  EXTREMITIES: No edema, no cyanosis, no clubbing. Pulses +2. Capillary refill less than 3.  MUSCULOSKELETAL: No significant joint abnormalities or joint effusions.  NEUROLOGIC: Cranial nerves II through XII intact. Strength is 5/5 in all 4 extremities.  SKIN: Without any rashes or petechiae.  LYMPHATIC: Negative for lymphadenopathy in neck or supraclavicular areas.  PSYCHIATRIC: Negative for anxiety, depression or tremors or signs of DTs.   LABORATORY AND RADIOLOGIC DATA: Blood sugar is 611, creatinine 1.15, sodium 121 (corrected by glucose is 129 to 130). His chloride is 84. His lipase is 789. His anion gap is 11 on the computer, but corrected using a correction of the sodium already is around 20, which is elevated. His hemoglobin A1c is 12.2. His bilirubin is 1.6. His alkaline phosphatase is 164. His white count is 9.6. His hemoglobin is 13.9. His urinalysis is  normal without any signs of infection. His venous pH is 7.39; pCO2 is 50.   CT of the abdomen shows liver diffusely low attenuation secondary to hepatic steatosis. No intrahepatic or extrahepatic ductal dilation. No gallbladder; it is surgically absent. Peripancreatic inflammatory changes concerning for acute pancreatitis. Phlegmonous changes adjacent to the superior aspect of the pancreatic tail, which is a pseudocyst and it looks decreased in comparison with the scan done on January 24. Gastric wall thickening, reactive secondary to inflammatory changes of the pancreas, versus gastritis.   ASSESSMENT AND PLAN: This is a 64 year old gentleman with chronic pancreatitis, diabetes, gastroesophageal reflux disease, history of Helicobacter pylori, alcoholism and history of acute pancreatitis in January. He has come in with severe hyperglycemia and acute pancreatitis again.  1.  Acute pancreatitis: The patient needs to be educated about the diet. Apparently, the last time he states nobody talked to him about what pancreatitis is, what it means and what he needs to eat differently. He was only aware that he needed to quit alcohol. We are going to keep him "nothing by mouth," give him IV fluids, try to rehydrate him and follow up lipase in the morning. I am going to obtain a gastroenterology consultation for valuation.  He does have a pseudocyst, and the pseudocyst looks smaller than before. There are no signs of abscess or infection around the pancreas. No necrosis of the pancreas is described on the CT scan, and I did not see one myself. The patient does not need to be on antibiotics at this moment. He is not septic and not showing any signs of infection.  2.  Hyperglycemia: The patient comes with glucoses at level of hyperosmolar levels in the 600s. He has an anion gap of 20, which is elevated, but his pH on the venous blood gas is 7.39, which when corrected should be still normal but on the low side, for what we  are going to monitor closely. I am going to put him on an insulin drip, as the patient is going to be "nothing by mouth." His hemoglobin A1c is 12, which was not checked before, but we are going to likely discharge him on insulin, as the patient might not be producing any endogenous insulin due to his chronic pancreatitis. I am going to get a C-peptide on this patient. The patient is going to be transitioned to a longer-lasting insulin. We are going to stop metformin. Likely he will benefit from having Lantus. 3.  Alcohol abuse: The patient actually has not been drinking for a little bit over 1-1/2 months, for what I do not think he needs any further delirium tremens prophylaxis. We are going to continue to observe, and if there are any signs of delirium tremens start [place] Withdrawal Assessment protocol.  4.  History of Helicobacter pylori infection: The patient has been treated for Helicobacter pylori with 2 different antibiotics and a proton pump inhibitor for 14 days at the least, and he was previously treated with oral antibiotics here for the pseudocyst, likely to be an abscess, although it does not show to be an abscess. I am going to repeat the Helicobacter pylori antigen, this time on a stool, to prove eradication. Will start antibiotics again if the patient continues to have an active infection, as the patient has some inflammation of the lower curvature of the abdomen that could be a reflection of gastritis, although most likely a reflection of adjacent swelling from the pancreatitis.  5.  Hyponatremia: This is pseudohyponatremia related to hyperosmolar state. Continue IV fluids.  6.  Elevated bilirubin: Likely due to steatosis. The patient does not have any dilation of the intrahepatic or extrahepatic biliary conduits.  7.  History of depression: The patient takes Paxil. It has been very well controlled.  8.  History of gastritis and gastroesophageal reflux disease: Continue proton  pump inhibitor. I am going to increase it to twice daily, as the patient has inflammation on the lower curvature of the abdomen.  9.  Hypertension: The patient is taking lisinopril and hydrochlorothiazide. Okay to continue these medications. I am not concerned about the low sodium with the hydrochlorothiazide, as the patient is actually having a better sodium when corrected. At this moment, I am going to hold on his blood pressure medication until he is able to tolerate diet. His blood pressure seems to be on the low side, for what we are just going to continue to monitor.  10.  The patient is a full code.  11.  Deep vein thrombosis prophylaxis with mechanical measures.   TIME SPENT: I spent about 45 minutes with this patient.   ____________________________ Fannett Sink, MD rsg:jm D: 09/06/2012 17:18:43 ET T: 09/06/2012 17:44:40 ET JOB#: [id number]  cc:  Reform Sink, MD, <Dictator> Arpan Eskelson America Brown MD ELECTRONICALLY SIGNED 09/23/2012 12:53

## 2014-10-29 NOTE — Consult Note (Signed)
PATIENT NAME:  [name], [name] MR#:  [id number] DATE OF BIRTH:  February 15, 1951  DATE OF CONSULTATION:  08/02/2012  REFERRING PHYSICIAN:    CONSULTING PHYSICIAN:  Manya Silvas, MD  The patient is a 64 year old white male who was admitted for acute pancreatitis flare. On CAT scan, he was found to have a pseudocyst formation, a lipase of 942, and elevated white count of 17,000 and he was admitted to CCU, started on IV antibiotics. I was consulted.   PAST MEDICAL HISTORY:  1. Chronic alcoholism, alcohol-induced pancreatitis. The last admission here was in 2009.  2. Type 2 diabetes for a year and a half. 3. GERD.  4. Hypertension.  5. Previous cholecystectomy several years ago. He did have gallstones at that time.  6. He has a history of gout. Four years ago he started allopurinol and does have a history of kidney stones. No kidney stones in several years.   PAST SURGICAL HISTORY: A cholecystectomy and tonsillectomy.  MEDICATIONS ON ADMISSION:  Lisinopril, hydrochlorothiazide 20/25, 1 tablet a day, sertraline 50 mg a day, aspirin 81 mg a day, allopurinol 100 mg a day, metformin 500 mg in the morning 1000 mg evening, a cinnamon tablet daily, a fish oil tablet once a day, multivitamins, calcium with vitamin D, vitamin B6, a magnesium tablet one a day, and a zinc tablet one a day.   FAMILY HISTORY: Father with lung cancer. The patient is a nonsmoker.  REVIEW OF SYSTEMS.  He does admit to drinking off and on. He may skip days and then some days consume several glasses of wine. He makes his own wine.   He has had fever and abdominal pain for a week. The fever got up to 101. He is feeling much better now. There is some nausea but no vomiting. He has caused pain across the abdomen going into his back. When he came in the hospital, his pain was 9 or 10 and now it is a one overnight with great improvement. No diarrhea. No constipation. No GU complaints since he no longer has kidney stones. He does have a  heartburn. He takes Protonix for this. No dysphagia. No respiratory complaints.   PHYSICAL EXAMINATION:  GENERAL: A white male in no acute distress. Temperature 98.8, pulse 80, blood pressure 135/82.  HEENT: Sclerae anicteric. Conjunctivae negative. Tongue negative.  HEAD: Atraumatic.  NECK: Negative.  CHEST: Clear.  HEART: No murmurs, gallops, clicks, or rubs.  ABDOMEN: Bowel sounds present. No hepatosplenomegaly. No masses. No bruits. Minimal tenderness in the mid abdomen.  EXTREMITIES: No edema.  SKIN: Warm and dry.  PSYCHIATRIC: Mood and affect appropriate.   LABORATORY, DIAGNOSTIC AND RADIOLOGICAL DATA: Yesterday a lipase 942, today 322, glucose yesterday was 413, today it is 157, creatinine 0.6, sodium 134, potassium 4.4, chloride 99, CO2 31, calcium 8.8, total protein 6.9, albumin 3, total bilirubin is 0.8, alk phos 109, SGOT 25, SGPT 26. Yesterday the bilirubin was 1.1 with a direct of 0.4. White count 12.4, yesterday it was 17, hemoglobin 14, platelet count is 214. He had 1+ ketones in his urine.   CAT scan of the abdomen yesterday shows there is a fluid collection between the spleen and the stomach, it may be arising from the pancreas. It measures 7.8 x 6 cm. Also a fluid collection ill-defined in the greater curvature stomach. Prostate calcifications present. Normal appendix. Some calcification in the liver. The findings suggestive of pancreatitis with pancreatic tail cyst or pseudocyst abscess. Abscess is felt to be less likely  although not ruled out.   ASSESSMENT: It is unusual to see rapid improvement in the patient with pancreatitis who has a pseudocyst, especially as rapid as this gentleman's pain went from a 9 to a 1. He did have a slightly elevated direct bilirubin and his total bilirubin came down today as well as the lipase. Since he had gallstones at the time of his gallbladder removal, it is possible that he formed a small common duct stone and had a bout of pancreatitis and  passed the stone. Certainly he could have pain from infected pseudocyst. I would be surprised that it improves this rapidly.      RECOMMENDATIONS:  1. I would advanced him to a clear liquid diet.  2. I would go ahead and start him on Actigall 300 mg b.i.d. with the probable hypothesis that he indeed pass a small stone which is why the significant improvement overnight in his abdominal pain and his lipase. I would continue that medication indefinitely. I would definitely continue IV antibiotics for a few days and then switch to oral medication such as either Augmentin or Levaquin.   I would be glad to follow him up as an outpatient.  ____________________________ Manya Silvas, MD rte:jm D: 08/02/2012 10:10:07 ET T: 08/02/2012 11:19:24 ET JOB#: [id number]  cc: Manya Silvas, MD, <Dictator> [name]. Caryn Section, MD Manya Silvas MD ELECTRONICALLY SIGNED 08/28/2012 7:52

## 2014-10-29 NOTE — Consult Note (Signed)
Chief Complaint:  Subjective/Chief Complaint seen for pancreatitis-pain resolved, tolerating po.  blood glucose better managed at present on insulin.   VITAL SIGNS/ANCILLARY NOTES: **Vital Signs.:   03-Mar-14 13:46  Temperature Temperature (F) 98.3  Celsius 36.8  Temperature Source oral  Pulse Pulse 83  Respirations Respirations 20  Systolic BP Systolic BP 404  Diastolic BP (mmHg) Diastolic BP (mmHg) 79  Mean BP 92  Pulse Ox % Pulse Ox % 95  Pulse Ox Activity Level  At rest  Oxygen Delivery Room Air/ 21 %   Brief Assessment:  Cardiac Regular   Respiratory clear BS   Gastrointestinal details normal Soft  Nondistended  No masses palpable  Bowel sounds normal  mild epigastric discomfort to palpation   Lab Results: Routine Chem:  03-Mar-14 04:58   Magnesium, Serum  1.6 (1.8-2.4 THERAPEUTIC RANGE: 4-7 mg/dL TOXIC: > 10 mg/dL  -----------------------)  Glucose, Serum  179  BUN 8  Creatinine (comp) 0.64  Sodium, Serum  135  Potassium, Serum 3.9  Chloride, Serum 100  CO2, Serum 31  Calcium (Total), Serum 9.3  Anion Gap  4  Osmolality (calc) 273  eGFR (African American) >60  eGFR (Non-African American) >60 (eGFR values <37m/min/1.73 m2 may be an indication of chronic kidney disease (CKD). Calculated eGFR is useful in patients with stable renal function. The eGFR calculation will not be reliable in acutely ill patients when serum creatinine is changing rapidly. It is not useful in  patients on dialysis. The eGFR calculation may not be applicable to patients at the low and high extremes of body sizes, pregnant women, and vegetarians.)   Assessment/Plan:  Assessment/Plan:  Assessment 1) recurrent pancreatitis, pancreatic cyst/pseudocyst on ct, adjacent inflammatory to gastric fundus/ abnormal ct.   2) history of etoh abuse, abstinence of r 6 weeks.   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  I have discussed  the risks benefits and complications of egd to include not limited to bleeding infection perforation and sedation and he wishes to proceed. 2) pancreatic enzymes, creon 24K lipase units with each meal, continue ppi.  3) awaiting labs-fecal elastase, cea, ca19-9.   Electronic Signatures: SLoistine Simas(MD)  (Signed 03-Mar-14 22:05)  Authored: Chief Complaint, VITAL SIGNS/ANCILLARY NOTES, Brief Assessment, Lab Results, Assessment/Plan   Last Updated: 03-Mar-14 22:05 by SLoistine Simas(MD)

## 2014-10-29 NOTE — Consult Note (Signed)
PATIENT NAME:  [name], [name] MR#:  [id number] DATE OF BIRTH:  1950/11/30  DATE OF CONSULTATION:    REFERRING PHYSICIAN:  [facility]. CONSULTING PHYSICIAN:  Zelie Asbill A. [name], MD  PRIMARY CARE PHYSICIAN:  Dr. Caryn Section.  REASON FOR CONSULTATION:  I was asked to see this patient for abdominal pain and pancreatitis.    HISTORY OF PRESENT ILLNESS:  The patient is a 64 year old white male with history of diabetes, hypertension and long-standing history of chronic alcohol use who admits to drinking rather heavily in the last 2 to 3 weeks mainly of home wine and some spirits as well.  He has been having intermittent nausea and vomiting and was seen in his primary care physician's office earlier where a serology done for H. pylori turnes positive and he was started on appropriate antibiotic therapy and PPI therapy.  He continued to have pain and headache, came to the Emergency Room at which point a CT scan was obtained demonstrating acute pancreatitis with a pancreatic pseudocyst formation seen in the tail.  He had a white count of 17,000, low-grade fever 100.4, lipase of 900.  He is admitted to the medical service.  Surgical services were asked to consult.  In the past, patient was admitted in 4008 for alcoholic pancreatitis which resolved.  He has a remote history of laparoscopic cholecystectomy by his report for stones.   PAST MEDICAL HISTORY:  Type 2 diabetes, gastroesophageal reflux disease, history of alcohol use, hypertension.   PAST SURGICAL HISTORY:  Laparoscopic cholecystectomy.   SOCIAL HISTORY:  He is married, lives at home.  Drinks as described above.  Heavy drinker in the past.  He does make his own wine.    MEDICATIONS:  Lisinopril/hydrochlorothiazide, sertraline, aspirin, allopurinol, metformin, cinnamon, fish oil, multivitamin, calcium, vitamin B6, magnesium and zinc.   FAMILY HISTORY:  Significant for lung cancer and diabetes.   REVIEW OF SYSTEMS:  Is as described above.   PHYSICAL  EXAMINATION: GENERAL:  The patient is in the intensive care unit in no obvious distress.  Affect is normal.  VITAL SIGNS:  Temperature is 98.3, pulse of 74, respiratory rate of 17, blood pressure is 111/68, room air saturation is 95%.  At present he has very little pain.  HEENT:  Sclerae are clear.  LUNGS:  Clear bilaterally.  HEART:  Regular rate and rhythm.  ABDOMEN:  Soft and minimally tender in the left upper quadrant.  Mildly tender in the epigastrium and periumbilical region.  No obvious hernias.  Multiple laparoscopy scars.  EXTREMITIES:  Are warm and well-perfused.   NEUROLOGIC AND PSYCHIATRIC:  Grossly normal.  MUSCULOSKELETAL:  Normal.   LABORATORY VALUES:  Glucose is 413, sodium 126, chloride 87, lipase is 942, total bilirubin 1.1.  Liver function tests are normal.  White count 17,000, hemoglobin 15.5, platelet count 277,000.  Review of CT scan, I personally reviewed the images.  There is a 7.75 cm x 6.25 cm fluid collection seen within the tail of the pancreas between the spleen and the stomach arising from the pancreatic tail.  The pancreatic tail appears to be edematous.  There was cholecystectomy clips present.  There is some scant amount of retroperitoneal free fluid seen.   IMPRESSION:  This is a 64 year old white male with a history of alcohol abuse and recent rather heavy drinking with history of alcohol-induced pancreatitis, now with alcoholic-induced pancreatitis and pseudocyst.  His pain has resolved rather quickly.  Possibility of a common bile duct stone should be entertained, but is  rather unlikely.   PLAN:  At this point in time I see no indication for surgical intervention for drainage of the cyst.  Its age in my opinion is indeterminate.  If he is feeling better his diet can be advanced as his pain and lipase improve and I will see him back in my office for repeat imaging in the near future.  I will follow the patient with you in the hospital.   TOTAL TIME SPENT:   Forty-five minutes.       ____________________________ Jeannette How [name], MD mab:ea D: 08/02/2012 19:49:29 ET T: 08/03/2012 06:01:51 ET JOB#: [id number]  cc: Elta Guadeloupe A. [name], MD, <Dictator> [name]. Caryn Section, MD Hortencia Conradi MD ELECTRONICALLY SIGNED 08/03/2012 6:50

## 2014-10-29 NOTE — Consult Note (Signed)
CC: pancreatitis with pseudocyst.  Pt with Tmax 100.8, on Invanz. other vital signs normal.  Pain feels better, only a little pain after eating.  No chills and slept well.  No new plans.  Advised him to absolutely stop drinking due to this serious complication of his pancreatitis.  Agree with plan to repeat CT in a few days.  Electronic Signatures: Manya Silvas (MD)  (Signed on 26-Jan-14 11:50)  Authored  Last Updated: 26-Jan-14 11:50 by Manya Silvas (MD)

## 2014-10-29 NOTE — Consult Note (Signed)
PATIENT NAME:  [name], [name] MR#:  [id number] DATE OF BIRTH:  02/23/1951  DATE OF CONSULTATION:  09/07/2012  REFERRING PHYSICIAN:  Aurora Sink, MD  CONSULTING PHYSICIAN:  Lollie Sails, MD  REASON FOR CONSULTATION: Recurrent pancreatitis.   HISTORY OF PRESENT ILLNESS: The patient is a 64 year old Caucasian male who actually came to the Emergency Room after having difficulty controlling his blood sugars at home. He states that his blood sugar at home usually runs around 240 or so, went over 300, and prior to coming to the hospital had actually hit closer to 600. He does have a history of recurrent pancreatitis. He was hospitalized 01/24 through 08/03/2012 with pancreatitis at that time. He states that he had no pain on discharge; however, in the interim he has had multiple short episodes of abdominal pain; and over the period of the past week earlier in the week, he had several days of nausea with epigastric pain consistent with his previous pancreatitis. There was no emesis. He states that he has been drinking alcohol in the past; however, he stopped after his hospitalization in January 2014. In January, he had a CT scan of the abdomen and pelvis showing findings suggestive of pancreatitis with pancreatic tail cyst or pseudocyst and possibly an adjacent pseudocyst near the greater curvature of the stomach. Cystic malignancy was felt to be less likely. An abscess was also felt to be less likely. There was not any ascites at the time. The pseudocyst measured 7.76 x 6.24 cm at that time. When he came back to the hospital, he had a second CT scan done yesterday, this showing some further peripancreatic inflammatory changes concerning for acute pancreatitis, some phlegmonous changes adjacent to the superior aspect of the pancreatic tail and adjacent to the fundus of the stomach. The cyst adjacent to the fundus of the stomach had decreased in size to 3.5 x 3.1 cm. There was some gastric wall  thickening, might have been reactive in nature versus secondary to gastritis itself. He also was seen to have some hepatic steatosis. He states that he had a history of pancreatitis previously as well with an episode in January 2009. This was felt to be alcohol-related. He states that over the period of the past year his appetite has decreased. He has had a weight loss over the past year of about 50 pounds, about 25 of that being in the past several months. Currently, he denies any abdominal pain. He was given some solid foods today which do not seem to have increased discomfort. He does have a history of heartburn for which he takes Protonix on a daily basis. There is no dysphagia. His bowel movements are variable but in the past year or so have been more mushy and loose-to-diarrhea. He recently had a Helicobacter pylori serology done at Dr. Percell [place] outpatient office and was treated with a 14-day course of antibiotics for this. He is scheduled to have an EGD and colonoscopy on 09/23/2012 with that physician. He states he has had no alcohol for about 6 weeks. He states he has not had a problem with high triglycerides in the past. Prior to 6 weeks ago, he was drinking at least 3 drinks a day, these being mixed drinks.   PAST MEDICAL AND SURGICAL HISTORY:  1. History of type 2 diabetes.  2. Gastroesophageal reflux.  3. Hypertension.  4. Chronic alcohol abuse.  5. Alcohol-induced pancreatitis.  6. Helicobacter pylori infection, recently treated.  7. History of pancreatic pseudocyst.  8.  He has had a laparoscopic cholecystectomy many years ago. 9. Remote T and A.   SOCIAL HISTORY: He has never been a smoker. Alcohol as above.   GASTROINTESTINAL FAMILY HISTORY: Pertinent for father with colon polyps, however, no family history of colon cancer, gastric cancer or pancreatic cancer. The patient himself has had 2 colonoscopies in the past, these being negative for colon polyps. He has had an EGD in the past;  however, that result is not in the computer.   REVIEW OF SYSTEMS: Ten systems reviewed per admission History and Physical, agree with same.   PHYSICAL EXAMINATION: VITAL SIGNS: Temperature 98.6, pulse 80, respirations 23, blood pressure 116/74, pulse oximetry 98%.  GENERAL: He is a well-appearing, somewhat ruddy-complexioned 64 year old male in no acute distress.  HEENT: Normocephalic, atraumatic. Eyes: Anicteric. Nose: Septum midline. No lesions. Oropharynx: No lesions.  NECK: Supple. No JVD. No lymphadenopathy. No thyromegaly.  HEART: Regular rate and rhythm without rub or gallop.  LUNGS: Bilaterally clear.  ABDOMEN: Soft. He is mildly tender to palpation in the epigastric region, particularly lower epigastric region. Bowel sounds are positive, normoactive. There is no apparent organomegaly or masses felt.  RECTAL: Anorectal exam is deferred.  EXTREMITIES: No clubbing, cyanosis, or edema.  NEUROLOGICAL: Cranial nerves II through XII are grossly intact. Muscle strength bilaterally equal and symmetric 5 out of 5. DTRs bilaterally equal and symmetric.   LABORATORY AND RADIOLOGICAL DATA:  Glucose 175, BUN 14, creatinine 0.65, sodium 136, potassium 3.1, chloride 98, calcium 8.7, magnesium 1.6, triglycerides 116, lipase 242 today. His lipase on admission was 789. On admission, his hepatic panel showed an alkaline phosphatase of 164, total bilirubin 1.6. Hepatocellular enzymes were normal. Albumin was 3.6. Hemogram showed a white count of 9.6, H and H of 13.9/40.2, platelet count of 205.   He had a CT scan of the abdomen and pelvis with results as stated above.   ASSESSMENT: Chronic recurrent pancreatitis in the setting of a previous history of alcohol abuse. The patient states he has been abstinent now for about 6 weeks. Further, he does relate problems with a change of bowel habits with looser stools. 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 Of note,  the patient relates a marked weight loss over the period of the past year in addition to his change of bowel habits.   RECOMMENDATIONS:  Continue current in regards to management of his diabetes in anticipation of an inpatient EGD and/or colonoscopy. We will obtain a stool fecal elastase for further evaluation of possible pancreatic malabsorption. We will also obtain a CA-19-9. Although the size of the pancreatic pseudocyst has decreased since January CT as noted above, the weight loss and overlying irritation at the level of the gastric fundus is of concern. We will, at this point, decrease his diet a bit to a full liquid.   ____________________________ Lollie Sails, MD mus:cb D: 09/07/2012 18:32:18 ET T: 09/07/2012 18:53:59 ET JOB#: [id number]  cc: Lollie Sails, MD, <Dictator> Lollie Sails MD ELECTRONICALLY SIGNED 09/24/2012 18:21

## 2014-10-29 NOTE — Discharge Summary (Signed)
DATE OF BIRTH:  Mar 29, 1951  ADMITTING DIAGNOSIS:  Acute pancreatitis.   DISCHARGE DIAGNOSES: 1.  Acute-on-chronic pancreatitis. 2.  Pancreatic cyst. 3.  Systemic inflammatory response reaction and sinus tachycardia due to systemic inflammatory response reaction, resolved.  4.  Alcohol abuse ongoing.  5.  Dehydration. 6.  Hyponatremia, resolved on IV fluids.  7.  History of diagnosis of Helicobacter pylori infection, undergoing triple therapy. The patient is to continue 10-day therapy after finishing his therapy for pancreatitis. 8.  Diabetes mellitus. 9.  Gastroesophageal reflux disease.  10.  Hypertension.  11.  Status post tonsilectomy, status post cholecystectomy in the past.  The patient was advised to continue antibiotics with Augmentin for 5-day therapy then resume Helicobacter pylori treatment for 10 days. The patient was given an additional 2-day supply for amoxicillin, as well as clarithromycin. However, the patient was advised to start omeprazole at once upon returning home at 20 mg p.o. twice daily dose. The patient was advised to continue this medication twice a day for one month.   DISCHARGE MEDICATIONS:  1.  Paxil 40 mg p.o. daily.  2.  Lisinopril/hydrochlorothiazide 10 mg p.o. daily.  3.  Phenergan one tablet every 4-6 hours as needed.  4.  Metformin 1,000 mg p.o. at bedtime.  5.  Amoxicillin clavulanate 875/125 mg one tablet twice daily for six days.  6.  Amoxicillin 500 mg p.o. two capsules in the morning, as well as at bedtime for two days, and this is H. pylori therapy. The patient is to resume this therapy as soon as he is done with Augmentin therapy.  7.  Clarithromycin 500 mg p.o. twice daily. Again, this is H. pylori therapy. The patient is to resume it whenever he is done with Augmentin.  8.  Omeprazole 20 mg p.o. twice a day to be started as soon as he returns home.  9.  Percocet 5/325 mg one tablet every four hours as needed.  10.  The patient is not take  Protonix 40 mg p.o. daily dose at this time until recommended by primary care physician.   DIET:  Is 2 grams salt, low fat, low cholesterol, and carbohydrate controlled diet. For liquids, he is to be advanced to regular consistency over the next 2-3 days, thin liquids.   ACTIVITY LIMITATIONS:  As tolerated.   FOLLOWUP:  Appointment with Dr. Caryn Section in two days after discharge, as well as Dr. Vira Agar in one week after discharge.   CONSULTANTS:  Dr. Vira Agar, Dr. [name], and Dr. Burt Knack.   RADIOLOGIC STUDIES:  CT scan of the abdomen and pelvis with contrast 08/01/2012 revealing findings suggestive of pancreatitis with pancreatic tail cyst or pseudocyst and possibly also adjacent to the greater curvature of the stomach, correlate with clinical as well as laboratory data. Cystic malignance was felt to be less likely. An abscess was felt to be less likely according to the radiologist.   HISTORY OF PRESENT ILLNESS:  The patient is a 64 year old Caucasian male with past medical history significant for history of alcohol abuse, who presented to the hospital with the complaint of abdominal pains. Please refer to Dr. Serita Grit admission note on 08/01/2012.   PHYSICAL EXAMINATION:   VITAL SIGNS:  His pulse on arrival was 100 to 110s, tachycardic, temperature was 100.4, respiratory rate was 20, blood pressure 119/75, saturation was 95% on room air.  ABDOMEN:  Physical exam revealed tenderness in the epigastric area, but no masses were felt.   LABORATORY DATA:  On arrival to the hospital, revealed a  low sodium at 126, low glucose level of 413. Otherwise, BMP was unremarkable. The patient's lipase level was elevated at 942. Liver enzymes revealed a total bilirubin of 1.1, direct bilirubin 0.4. Otherwise, liver enzymes were normal. The patient's cardiac enzymes were normal. White blood cell count was elevated to 17, hemoglobin was 15.5, platelet count 277,000, absolute neutrophil count was high at 14.4. Urinalysis was  unremarkable. The patient's CT scan showed pancreatitis with possible cyst versus pseudocyst.  HOSPITAL COURSE:  The patient was admitted to the hospital. He was started on IV fluids, as well as pain medication and antibiotic therapy. With this therapy, he improved significantly. He was consulted by a gastroenterologist, Dr. Vira Agar, who followed the patient along with surgeon, Dr. [name], as well as Dr. Burt Knack. Dr. Vira Agar felt that the patient is to continue current therapy and have his diet advanced gradually. Even over 24 hours while he was in the hospital, the patient significantly improved. His lipase level normalized. Already by 08/02/2012, it was 322. The patient was then started on clear liquid diet, and the patient was having some mild discomfort in his abdomen. However, his lipase remained relatively stable to mildly elevated, 491. However, he felt quite well after he ate a full liquid diet, did not have any worsening of his symptomatology, and it was felt that he was stable to be discharged home for management of his pain, as well as his diet as outpatient. He was evaluated by gastroenterologist, Dr. Vira Agar, who felt that the patient needs to follow up with him and have CT scan of his abdomen repeated in approximately 1 week after discharge. He was also seen by surgeons, who felt that the patient's disease is nonsurgical, and no drainage of his cyst was recommended at this point. The patient is to continue antibiotic therapy for 5 more days with Augmentin, then follow up with his primary care physician, as well as gastroenterologist, Dr. Vira Agar, to repeat his CT scan of abdomen. Decisions will be made further down the road in regards to management of his chronic pancreatitis. The patient was also advised not to drink any alcohol.   In regards to dehydration, hyponatremia, the patient was continued on IV fluids while in the hospital, and his sodium level normalized by 08/03/2012. It was 137.   For  leukocytosis, the patient's white blood cell count improved from 17 on the day of admission to 11 on the day of discharge, 08/03/2012. The patient was advised to continue antibiotic therapy for a few more days and later on resume his H. pylori triple therapy. The patient was given a 2-day supply for antibiotics for H. pylori infection treatment including amoxicillin, as well as clarithromycin. The patient was also advised to continue omeprazole at 20 mg p.o. twice daily dose from the day of discharge. He is to remain on omeprazole for approximately 1 month. He is, however, to resume his H. pylori therapy. He is to continue 8-day supply, which he has at home, as well as two days, which he is going to be given by [place] upon discharge. For his chronic medical problems, such as diabetes, gastroesophageal reflux disease, hypertension, the patient is to continue with outpatient management. He is being discharged in stable condition with the above-mentioned medications and followup. His vital signs on the day of discharge: Temperature is 98.9, pulse 92, respirations 20, blood pressure 120/80, and saturation was 94% on room air at rest.  TIME SPENT:  40 minutes.    ____________________________ Theodoro Grist, MD  rv:ms D: 08/03/2012 19:38:57 ET T: 08/03/2012 20:18:00 ET JOB#: [id number]  cc: Theodoro Grist, MD, <Dictator> [name]. Caryn Section, MD Theodoro Grist MD ELECTRONICALLY SIGNED 08/28/2012 17:08

## 2014-10-29 NOTE — Discharge Summary (Signed)
PATIENT NAME:  [name], [name] MR#:  [id number] DATE OF BIRTH:  1951/05/14  DATE OF ADMISSION:  09/06/2012 DATE OF DISCHARGE:  09/09/2012  PRIMARY CARE PHYSICIAN:  Dr. Caryn Section    FINAL DIAGNOSES: 1. Acute on chronic pancreatitis with pancreatic pseudocyst.  2.  Hyperglycemia and uncontrolled diabetes.  3.  Erosive gastritis.  4.  Esophageal varices, grade I.   5.  Diabetes.  6.  Alcohol abuse.    PROCEDURES:  EGD showing erosive gastritis and esophageal varices grade 1.   CONDITION: Stable.   CODE STATUS: Full code.   MEDICATIONS: Paxil 40 mg p.o. daily, lisinopril/HCTZ 10 mg p.o. daily, Phenergan 1 tablet every 4 to 6 hours p.r.n.   NEW MEDICATION: Lantus 25 units subcutaneous at bedtime, insulin aspart 10 units subcutaneous t.i.d. before meals, Carafate 1 gram p.o. 4 times a day before meals and at bedtime, Protonix 40 mg p.o. daily.   DIET: Low sodium, low fat, low cholesterol, ADA diet.   ACTIVITY: As tolerated.   FOLLOW-UP CARE: PCP within 1 to 2 weeks. Follow up with Dr. Gustavo Lah within 1 to 2 weeks   CONSULTATIONS: Dr. Gustavo Lah.   REASON FOR ADMISSION: Hyperglycemia and overall feeling very better.   HOSPITAL COURSE: The patient is a 64 year old Caucasian male with a history of pancreatitis and  pancreatic cyst, presented to the ED with hyperglycemia. The patient has been eating a lot of fatty food, hamburgers, chicken wings for several days. In addition, patient from developed some abdominal pain, nausea, vomiting several days ago, but may have resolved during admission. The patient was noted to have an elevated blood sugar at 600. For detailed history and physical examination, please refer to the admission note dictated by Dr. Laurin Coder. On admission date, the patient's blood sugar 611, creatinine 1.15, sodium 121, chloride 84, lipase 789,  anion gap 11. CT of the abdomen showed diffuse low attenuation secondary to hepatitic steatosis, peripancreatic inflammatory changes  concerning for acute pancreatitis. In addition, the patient has pseudocyst.  After admission, the patient was kept n.p.o. and IV fluid support, educated about diet control and alcohol cessation. The patient has no complaints of abdominal pain, nausea, vomiting or diarrhea.  1.  Hyperglycemia.  The patient was initially admitted to Critical Care Unit and was started on insulin drip, which was transitioned to Lantus 25 units subcutaneous with NovoLog 9 units three times a day before meals. The patient's blood sugar has been stable around 200.  2.  Erosive gastritis and esophageal varices. Since the patient has abdominal pain, nausea, vomiting and history and no weight loss, Dr. Gustavo Lah evaluated the patient and did endoscopy, which showed erosive gastritis and esophageal varices. Dr. Gustavo Lah recommended Protonix 40 mg p.o. daily and Carafate 1 gram 4 times a day and follow-up with him as outpatient. The patient has alcohol abuse, which has been monitored for withdrawal and start CIWA protocol, but no signs of withdraw.   3.  Hyponatremia. Which was treated with IV normal saline and has improved.  4.  Hypertension. The patient has been treated with lisinopril/HCTZ. Blood pressure has been stable. The patient is clinically stable and will be discharged to home today. I discussed the patient's discharge plan with the patient and case manager.   TIME SPENT: About 38 minutes     ____________________________ Demetrios Loll, MD qc:cc D: 09/09/2012 17:03:56 ET T: 09/09/2012 19:23:23 ET JOB#: [id number]  cc: Demetrios Loll, MD, <Dictator> Demetrios Loll MD ELECTRONICALLY SIGNED 09/10/2012 14:50

## 2014-10-29 NOTE — Consult Note (Signed)
Brief Consult Note: Diagnosis: recurrent pancreatitis.   Patient was seen by consultant.   Consult note dictated.   Recommend to proceed with surgery or procedure.   Recommend further assessment or treatment.   Orders entered.   Comments: Please see full GI consult [phone].  Patient admitted with elevated blood glucose and Pancreatitis in the setting of known h/o both.  CT showing pancreatic pseudocyst, smaller than previous study, but with evidence of thickening of adjacent gastric wall.  Paitent with significant weight loss over the past year with change of bowel habits, loose/mush.  Possible pancreatic malabsorbtion from chronic pancreatitis related to etoh abuse.  However also concern of possible malignancy as well.  Will obtain CEA, CA19-9,  fecal elastase and plan for egd (likely tuesday) to evaluate abnorality on ct.  Electronic Signatures: Loistine Simas (MD)  (Signed 02-Mar-14 18:37)  Authored: Brief Consult Note   Last Updated: 02-Mar-14 18:37 by Loistine Simas (MD)

## 2014-10-29 NOTE — Consult Note (Signed)
Brief Consult Note: Diagnosis: pancreatitis and acute peripancreatic fluid collection.   Patient was seen by consultant.   Comments: No indication at present for large pseudocyst, age undetermined as yet, may need drainage if persists of remains symptomatic.  Electronic Signatures: Sherri Rad (MD)  (Signed 25-Jan-14 03:01)  Authored: Brief Consult Note   Last Updated: 25-Jan-14 03:01 by Sherri Rad (MD)

## 2014-10-29 NOTE — Consult Note (Signed)
Chief Complaint:  Subjective/Chief Complaint seen for pancreatitis.  doing well, no n/v or abdominal pain.  presenting to ENDO for egd.   VITAL SIGNS/ANCILLARY NOTES: **Vital Signs.:   04-Mar-14 05:25  Vital Signs Type Routine  Temperature Temperature (F) 98  Celsius 36.6  Temperature Source oral  Pulse Pulse 70  Respirations Respirations 20  Systolic BP Systolic BP 286  Diastolic BP (mmHg) Diastolic BP (mmHg) 77  Mean BP 90  Pulse Ox % Pulse Ox % 95  Pulse Ox Activity Level  At rest  Oxygen Delivery Room Air/ 21 %  Pulse Ox Heart Rate 74   Brief Assessment:  Cardiac Regular   Respiratory clear BS   Gastrointestinal details normal Soft  Nontender  Nondistended  No masses palpable  Bowel sounds normal   Lab Results: Oncology:  03-Mar-14 04:58   Carcinoembryonic Antigen (CEA) 2.0 (Roche ECLIA methodology       Nonsmokers  <3.9                                                     Smokers     <5.6)  Carbohydrate Antigen 19-9 16 (Roche ECLIA methodology)  Routine Chem:  04-Mar-14 04:52   Glucose, Serum  241  BUN 8  Creatinine (comp) 0.70  Sodium, Serum  135  Potassium, Serum 3.6  Chloride, Serum 101  CO2, Serum 31  Calcium (Total), Serum 9.1  Anion Gap  3  Osmolality (calc) 276  eGFR (African American) >60  eGFR (Non-African American) >60 (eGFR values <82m/min/1.73 m2 may be an indication of chronic kidney disease (CKD). Calculated eGFR is useful in patients with stable renal function. The eGFR calculation will not be reliable in acutely ill patients when serum creatinine is changing rapidly. It is not useful in  patients on dialysis. The eGFR calculation may not be applicable to patients at the low and high extremes of body sizes, pregnant women, and vegetarians.)  Magnesium, Serum  1.5 (1.8-2.4 THERAPEUTIC RANGE: 4-7 mg/dL TOXIC: > 10 mg/dL  -----------------------)   Radiology Results: CT:    01-Mar-14 15:57, CT Abdomen and Pelvis With Contrast  CT  Abdomen and Pelvis With Contrast   REASON FOR EXAM:    (1) epigastric pain; (2) epigastric pain  COMMENTS:       PROCEDURE: CT  - CT ABDOMEN / PELVIS  W  - Sep 06 2012  3:57PM     RESULT: History: Epigastric pain    Comparison:  08/01/2010    Technique: Multiple axial images of the abdomen and pelvis were performed   from the lung bases to the pubic symphysis, without p.o. contrast and   with 85 ml of Isovue 370 intravenous contrast.    Findings:  The lung bases are clear. There is no pneumothorax. The heart size is   normal.     The liver is diffusely low in attenuation likely secondary to hepatic   steatosis. There is no intrahepatic or extrahepatic biliary ductal   dilatation. The gallbladder is surgically absent. The spleen demonstrates   no focal abnormality. The kidneys and adrenal glands are normal. The   bladder is unremarkable.     There is peripancreatic inflammatory changes most concerning for acute   pancreatitis. There are phlegmonous changes adjacent to the superior   aspect of the pancreatic tail adjacent to the  fundus of the stomach.   There is a 3.5 x 3.1 cm cystic mass adjacent to the fundus of the stomach   which likely represents a pseudocyst which has decreased in size compared   with 08/01/2010  There is gastric wall thickening which may be reactivesecondary to the   adjacent inflammatory changes in the pancreas versus secondary to   gastritis. The duodenum, small intestine, and large intestine demonstrate   no contrast extravasation or dilatation.  There is no pneumoperitoneum,   pneumatosis, or portal venous gas. There is no abdominal or pelvic free   fluid. There is no lymphadenopathy.     The abdominal aorta is normal in caliber .    The osseous structures are unremarkable.    IMPRESSION:     1. There is peripancreatic inflammatory changes most concerning for acute   pancreatitis. There are phlegmonous changes adjacent to the superior     aspect  of the pancreatic tail adjacent to the fundus of the stomach.   There is a 3.5 x 3.1 cm cystic mass adjacent to the fundus of the stomach   which likely represents a pseudocyst which has decreased in size compared   with 08/01/2010    2. There is gastric wall thickening which may be reactive secondary to   the adjacent inflammatory changes in the pancreas versus secondary to   gastritis.     3. Hepatic steatosis.    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  CT showing pancreatic pseudocyst and abnormal gastric wall, possibly gastritis.  abnormal CT. of note CEA and CA 19-9 normal.   Plan 1) for EGD today.  I have discussed the risks benefits and complicatiosn of egd to include not limited to bleeding infection perforation and sedations and he wishes to proceed.   Electronic Signatures: Loistine Simas (MD)  (Signed 04-Mar-14 08:45)  Authored: Chief Complaint, VITAL SIGNS/ANCILLARY NOTES, Brief Assessment, Lab Results, Radiology Results, Assessment/Plan   Last Updated: 04-Mar-14 08:45 by Loistine Simas (MD)

## 2014-10-29 NOTE — H&P (Signed)
PATIENT NAME:  [name], [name] MR#:  [id number] DATE OF BIRTH:  10-21-1950  DATE OF ADMISSION:  08/01/2012  PRIMARY CARE PHYSICIAN: Dr. Caryn Section   CHIEF COMPLAINT: Abdominal pain and nausea.   HISTORY OF PRESENT ILLNESS: [name] is a pleasant 64 year old Caucasian gentleman who has history of diabetes, hypertension, and history of chronic alcoholism in the past, who now drinks very sparingly, and comes to the emergency room with ongoing abdominal pain for the last couple of days. He was seen at Dr. Maralyn Sago office, who did H. pylori serology, which turned out to be positive. Hence, he was started on p.o. amoxicillin, clarithromycin, and PPI for treatment of H. pylori. The patient continued to have pain with some nausea and headache and was sent to the emergency room where workup in the ER concluded the patient to have acute pancreatitis with pseudocyst formation. The patient had white count of 17,000 and an elevated lipase of 942 and low-grade fever of 100.4. He is being admitted to the Intensive Care Unit for close monitoring of his acute pancreatitis. The patient is also being started on IV Invanz empirically.   PAST MEDICAL HISTORY:  1.  Type 2 diabetes.  2.  Gastroesophageal reflux disease.  3.  Hypertension.  4.  History of chronic alcoholism with history of alcohol-induced pancreatitis. Last admission was here at [facility] in 2009.  5.  History of cholecystectomy.   SOCIAL HISTORY: The patient is married, lives at home with his wife, drinks alcohol sparingly. He used to be a heavy drinker in the past. Last drink was a week and a half ago. The patient states he had wine.   PAST SURGICAL HISTORY: Cholecystectomy and tonsillectomy.   MEDICATIONS:  1.  Lisinopril/hydrochlorothiazide 20/25 one tablet daily.  2.  Sertraline 50 mg daily.  3.  Aspirin 81 mg daily.  4.  Allopurinol 100 mg daily.  5.  Metformin 500 mg in the morning and 1000 mg in the evening.  6.  Cinnamon tablet daily.   7.  Fish oil 1 tablet daily.  8.  Multivitamin p.o. daily.  9.  Calcium with vitamin D 200 international units daily.  10.  Vitamin B6 daily.  11.  Magnesium 1 tablet daily.  12.  Zinc 1 tablet daily.   FAMILY HISTORY: Brother has diabetes. Father had lung cancer.   Review of systems:  CONSTITUTIONAL: Positive for fever, fatigue, and weakness and abdominal pain.  EYES: No blurred or double vision. No glaucoma.  EARS, NOSE, AND THROAT: No tinnitus, ear pain, hearing loss.  RESPIRATORY: No cough, wheeze, hemoptysis.  CARDIOVASCULAR: No chest pain, orthopnea, edema. Positive for hypertension.  GASTROINTESTINAL: Positive for nausea and abdominal pain and GERD.  GENITOURINARY: No dysuria or hematuria.  ENDOCRINE: No polyuria or nocturia.  HEMATOLOGY: No anemia or easy bruising.  SKIN: No acne or rash.  MUSCULOSKELETAL: Positive for arthritis.  NEUROLOGIC: No cerebrovascular accident, transient ischemic attack.  PSYCHIATRIC: No anxiety or depression.   All other systems reviewed and negative.   PHYSICAL EXAMINATION:  GENERAL: The patient is awake, alert, oriented x3, mild to moderate distress due to abdominal pain.  VITAL SIGNS: Temperature is 100.4.  Pulse is 101 to 110, tachycardic. Respirations 20 per minute. Blood pressure is 119/75.  Sats are 95% on room air.  HEAD, EYES, EARS, NOSE, AND THROAT: Atraumatic, normocephalic. Pupils are equal, round, and reactive to light and accommodation. Extraocular movements intact. Oral mucosa is dry.  NECK: Supple. No JVD. No carotid bruit.  LUNGS:  Clear to auscultation bilaterally. No rales, rhonchi, respiratory distress, or labored breathing.  HEART:  Both the heart sounds are normal. Rate rhythm is tachycardic. No murmur heard. PMI not lateralized. Chest is nontender.  EXTREMITIES: Good pedal pulses, good femoral pulses. No pedal edema.  ABDOMEN: Soft.  There is some tenderness present in the epigastric area. No mass felt. No guarding or  rigidity.  NEUROLOGIC: Grossly intact. Cranial nerves II through XII. No motor or sensory deficits.  PSYCHIATRIC: The patient is awake, alert, oriented x3.   CT abdomen: Shows findings suggestive of pancreatitis with pancreatic tail cyst or pseudocyst and possibly pseudocyst, just into the greater curvature of the stomach.   Urinalysis negative for urinary tract infection positive for glucosuria.   White count was 17,000.  Glucose was 413. BUN is 11. Creatinine is 8.82, sodium is 126, potassium is 3.6. Lipase is 942, bilirubin is 1.1. LFTs within normal limits. Cardiac enzymes, first set negative.   EKG shows normal sinus rhythm with heart rate of 96, possible left atrial enlargement, incomplete right bundle branch block.   ASSESSMENT: A 63 year old [name] with history of alcohol-induced pancreatitis in the past, hypertension, and diabetes, comes in with:  1.  Acute pancreatitis with pseudocyst: It appears could be alcohol-induced versus patient also is on hydrochlorothiazide. The patient reports drinking wine about a week and a half ago. We will admit the patient to Intensive Care Unit for close monitoring given his elevated white count and pseudocyst formation along with acute pancreatitis. We will keep n.p.o. except medications and ice chips. We will continue IV fluids and empiric treatment and pain control with IV p.r.n. morphine. Dr. [name] consulted regarding pseudocyst and will see patient from surgical standpoint.  2.  Systemic inflammatory response syndrome due to acute pancreatitis: The patient will be started empirically on Invanz, follow blood cultures. Lipase in the morning. Dr. Vira Agar consulted. 3.  Helicobacter pylori by serology diagnosed Wednesday as an outpatient, was started on treatment with proton pump inhibitor, p.o. amoxicillin and clarithromycin.  Discussed with Dr. Vira Agar.  Okay to hold off on p.o. treatment for Helicobacter pylori at this time.  4.  Type 2 diabetes: Since  the patient is n.p.o., we will do sliding scale insulin.  5.  Hypertension: Hold hydrochlorothiazide, blood pressure is on the lower side. We will hold off on blood pressure medications. Once blood pressure stabilizes, resume lisinopril 20 mg daily.  6.  Anxiety: P.o. sertraline, which we will hold right now, give p.r.n. Ativan if needed.  7.  Deep vein thrombosis prophylaxis with subcutaneous heparin.  8.  Further workup will depend on the patient's clinical course. Hospital admission plan was discussed with the patient and the patient's wife. The case was also discussed with Dr. Vira Agar and Dr. [name], who will see the patient in consultation.   Critical Time Spent:55 minutes   ____________________________ Gus Height A. Posey Pronto, MD sap:th D: 08/01/2012 21:18:57 ET T: 08/01/2012 22:42:42 ET JOB#: [id number]  cc: Jahzara Slattery A. Posey Pronto, MD, <Dictator> [name]. Caryn Section, MD Ilda Basset MD ELECTRONICALLY SIGNED 08/15/2012 7:03

## 2014-10-30 NOTE — Op Note (Signed)
PATIENT NAME:  James, Moss MR#:  [id number] DATE OF BIRTH:  05-18-1951  DATE OF PROCEDURE:  05/04/2014  PREOPERATIVE DIAGNOSIS: Recurrent left indirect  inguinal hernia.   POSTOPERATIVE DIAGNOSIS: Recurrent left indirect inguinal hernia.   PROCEDURE PERFORMED: Left inguinal herniorrhaphy with Lichtenstein mesh application.   SURGEON: Thandiwe Siragusa A. [name], M.D.   ASSISTANT: Scrub tech.  ANESTHESIA: General with local.   FINDINGS: Recurrent left indirect inguinal hernia. The previously placed preperitoneal mesh appeared to have migrated cephalad on the anterior abdominal wall. This resulting in a recurrent indirect inguinal hernia orifice.   SPECIMENS: Lipoma of cord and sac.   ESTIMATED BLOOD LOSS: 25 mL.   COUNTS: Lap and needle count correct x 2.   DESCRIPTION OF PROCEDURE:  With informed consent in supine position, general endotracheal anesthesia was induced. The patient's left abdomen and groin, having been previously clipped of hair, was sterilely prepped and draped with ChloraPrep solution followed by [place]. Timeout was observed.  A curvilinear incision in the left groin above the inguinal ligament was fashioned with a scalpel and carried down with sharp dissection and electrocautery through Scarpa's fascia. The external oblique aponeurosis was identified with the external ring being identified. The fibers were incised laterally and carried through the external ring with sharp dissection. The spermatic cord was readily identifiable at the pubic tubercle and elevated  anteriorly. Cremasteric fibers were divided. The sac was dissected off the vas and vessels, doubly ligated at its base demonstrating no obvious contents. Lipoma of the cord was likewise excised and submitted as specimen. An Atrium ProLite mesh was brought onto the field, cut to the appropriate size, and secured in a standard [place] approach utilizing 2-0 Vicryl suture. The 2 tails were crisscrossed, inserted under the  undersurface of the external oblique, and secured to tighten the ring with a silk suture. The external oblique aponeurosis was then reapproximated with a combination of simple and running #0 Vicryl suture. Scarpa's fascia was reapproximated utilizing running 3-0 Vicryl suture. An ilioinguinal field block was created, as well as general field block on the operative field prior to closure. Subcutaneous tissues were reapproximated utilizing a running 4-0 Vicryl subcuticular stitch. Benzoin, Steri-Strips, Telfa, and Tegaderm were then applied, and the patient was then subsequently extubated and taken to the recovery room in stable and satisfactory condition by anesthesia services.    ____________________________ Jeannette How [name], MD mab:am D: 05/04/2014 17:44:08 ET T: 05/05/2014 04:25:40 ET JOB#: [id number]  cc: Elta Guadeloupe A. [name], MD, <Dictator> Hortencia Conradi MD ELECTRONICALLY SIGNED 05/09/2014 17:23

## 2014-10-30 NOTE — Discharge Summary (Signed)
PATIENT NAME:  [name], [name] MR#:  [id number] DATE OF BIRTH:  Nov 25, 1950  DATE OF ADMISSION:  02/25/2014 DATE OF DISCHARGE:  02/25/2014  DISCHARGE DIAGNOSES: 1.  Acute on chronic pancreatitis.  2.  Cirrhosis.  3.  Chronic pancreatic pseudocyst.  4.  Diabetes mellitus.  5.  Hyperlipidemia.  6.  Hypertension.  7.  Iron deficiency anemia.  8.  Portal hypertension.  9.  Mild hyponatremia.   CONSULTATIONS: Dr. [name] with surgery and GI.   IMAGING STUDIES DONE: Include a CT scan of the abdomen and pelvis with contrast, which showed a progressive phlegmonous inflammatory process in the upper abdomen and lower chest surrounding the esophagus, GE junction, stomach, and pancreas. Sequela of acute on chronic pancreatitis with numerous small pseudocysts. Cirrhosis with portal hypertension.   ADMITTING HISTORY AND PHYSICAL: Please see detailed H and P dictated previously by Dr. Waldron Labs. In brief, a 64 year old male patient with history of past alcohol abuse, recurrent alcoholic pancreatitis with history of pseudocyst, presented to the hospital complaining of severe epigastric abdominal pain. The patient was found to have elevated lipase along with significant abdominal pain, admitted to hospitalist service.   HOSPITAL COURSE: 1.  Acute on chronic pancreatitis with pseudocyst. The patient was kept n.p.o., put on IV fluids, IV and oral pain medications. The patient improved well with his lipase trending down to 700. Symptoms completely resolved by the time of discharge, not needing any pain medications, tolerating diet. He was initially started on meropenem for concern for possible abscess with his elevated white count, but with hydration his white count came down which was elevated likely from the stress and hemoconcentration. He was afebrile. Cultures have been negative. Meropenem stopped. The patient had a pseudocysts and not abscesses which were found on the CT scan. He was seen by GI and he does have a  scheduled appointment at [facility] which he will follow up with. He will likely need surgery with pancreatectomy or removal of his pseudocysts.  2.  Diabetes.  3.  Hypertension.  4.  Anemia, stable during the hospital stay.   Prior to discharge, the patient did not have any abdominal tenderness. Lungs sound clear. Heart sounds show S1, S2 regular.   DISCHARGE MEDICATIONS: Include:  1.  Allopurinol 100 mg oral once a day.  2.  Flexeril 10 mg oral 2 times a day as needed for spasms.  3.  Lisinopril 10 mg daily.  4.  Sertraline 100 mg daily.  5.  Centrum Silver 1 tablet daily.  6.  Niacin 500 mg daily.  7.  Calcium citrate 200 mg once a day.  8.  Magnesium oxide 500 mg oral once a day.  9.  Zinc 50 mg daily.  10.   Sucralfate 1 gram oral 4 times a day before meals and at bedtime.  11.   Ferrous sulfate 1 capsule oral 2 times a day.  12.   Lantus 20 units subcutaneous once a day.  13.   Omeprazole 40 mg daily.  14.   Vitamin B6 at 50 mg daily.  15.   NovoLog 5 to 8 units subcutaneous 3 times a day as needed per sliding scale.  16.   Potassium gluconate 1 tablet oral once a day.  17.   Cinnamon 1000 mg daily.  18.   Nadolol 20 mg 2 times a day.  19.   Acetaminophen oxycodone 325/10 one tablet oral 3 times a day as needed for pain.   DISCHARGE INSTRUCTIONS: Low fat carbohydrate-controlled  diet. Activity as tolerated. Follow up with [facility] in a week and primary care physician in 1 to 2 weeks.   The patient has been advised to call his doctor or return to the Emergency Room if his symptoms worsen in any way.   TIME SPENT ON DAY OF DISCHARGE AND DISCHARGE ACTIVITY: 35 minutes.     ____________________________ Leia Alf Missouri Lapaglia, MD srs:at D: 02/27/2014 13:03:59 ET T: 02/27/2014 14:00:29 ET JOB#: [id number]  cc: Alveta Heimlich R. Kayler Buckholtz, MD, <Dictator> [name]. Caryn Section, MD Neita Carp MD ELECTRONICALLY SIGNED 03/23/2014 16:29

## 2014-10-30 NOTE — H&P (Signed)
PATIENT NAME:  [name], [name] MR#:  [id number] DATE OF BIRTH:  1951/07/03  DATE OF ADMISSION:  02/25/2014  REFERRING PHYSICIAN: Dr. Lisa Roca.   PRIMARY CARE PHYSICIAN: Dr. Lelon Huh.   CHIEF COMPLAINT: Abdominal pain.   HISTORY OF PRESENT ILLNESS: This is a 64 year old male with known history of chronic alcoholism. Quit drinking more than a year ago, and history of alcohol or alcohol-induced pancreatitis with known history of pseudocyst of the pancreas, has diabetes, GERD, the patient presents with complaints of abdominal pain. The patient had basic blood work-up done which did show elevated lipase level at 1,798 and amylase of 328. He had leukocytosis at 19,200 and patient had CT abdomen and pelvis with IV contrast which did show evidence of progressive phlegmonous inflammatory process in the upper abdomen and lower chest  , esophageal junctions, stomach and pancreas. The patient reports he was recently left last week at [facility] for throat bruise and swelling, reports where he had visible bruising and ecchymosis at his throat all the way to around the neck and the throat. He reports he had endoscopy done. He is unclear if was EGD or ERCP. He cannot recall most much of the history over there. The patient's medical records were requested from [facility] already. The patient reports pain is epigastric, nonradiating as well, complain of some mild fever, nausea. Denies any vomiting. No diarrhea. No constipation. No chest pain. The patient had low grade temperature 99.3, but was mildly tachycardic upon presentation at 115. Hospitalist service was requested to admit the patient for further evaluation and treatment.   REVIEW OF SYSTEMS:  CONSTITUTIONAL: The patient reports feeling febrile, denies any chills, fatigue, weakness, weight gain, weight loss.  EYES: Denies blurry vision, double vision or inflammation.  EARS, NOSE AND THROAT:  Denies tinnitus, ear pain, hearing loss, epistaxis.   RESPIRATORY: Denies cough, wheezing, hemoptysis, or chronic obstructive pulmonary disease.  CARDIOVASCULAR: Denies chest pain, orthopnea, syncope, palpitations.  GASTROINTESTINAL: Patient reports nausea, abdominal pain. Denies diarrhea, vomiting or coffee ground emesis, constipation or bright red blood per rectum.  GENITOURINARY: Denies dysuria, hematuria or renal colic.  ENDOCRINE: Denies polyuria, polydipsia. No heat or cold intolerance.   HEMATOLOGIC: Reports history of anemia.  SKIN: Denies any rash, or petechiae or itching.  MUSCULOSKELETAL: Denies any back pain, swelling, gout, arthritis.  NEUROLOGIC: Denies history of CVA, TIA, tingling, numbness, focal deficits.  PSYCHIATRIC: Denies any depression or anxiety. The patient used to drink heavily has not been drinking for more than a year.   PAST MEDICAL HISTORY:  1. Type 2 diabetes.  2. GERD. 3. Hypertension.  4. Chronic alcoholism, currently quit.  5. Alcohol-induced pancreatitis.  6. No chronic pancreatitis.  7. Recent history of Helicobacter pylori infection. 8. History of pseudocyst of the pancreas.   9. Esophageal varices grade 1.   ALLERGIES: No known drug allergies.   SOCIAL HISTORY: The patient lives at home with his wife. He quit drinking as of July 2014. No smoking.   PAST SURGICAL HISTORY: Tonsillectomy and cholecystectomy.   FAMILY HISTORY: Significant for lung cancer.   HOME MEDICATIONS:  1. Lisinopril 10 mg oral daily.  2. Sertraline 100 mg oral daily.  3. Lantus 20 units subcutaneous at bedtime.  4. NovoLog 3 times a day sliding scale.  5. Allopurinol 100 mg oral daily.  6. Niacin 500 mg oral daily.  7. Nadolol 20 mg oral 2 times a day.  8. Cinnamon 1000 mg oral daily.  9. Ferrous fumarate with  iron polysaccharide 1 capsule 2 times a day.  10. Calcium citrate 200 mg oral daily. 11. Magnesium oxide 500 mg oral daily.  12. Potassium gluconate 1 tablet oral daily. 13. Cyclobenzaprine 10 mg oral 2 times a  day as needed. 14. Sucralfate 1 grams oral 4 times a day before meals and at bedtime.  15. Omeprazole 40 mg oral daily.  16. Centrum Silver 1 capsule oral daily. 17. Vitamin B6 50 mcg oral daily.   PHYSICAL EXAMINATION:  VITAL SIGNS: Temperature 99.3, pulse 89, respiratory rate 18, blood pressure 115/90, saturating 96% on room air. Upon presentation pulse was 115.  GENERAL: Well-nourished male who looks comfortable, in no apparent distress.  HEENT: Atraumatic, normocephalic. Pupils equal, reactive to light. Pink conjunctivae. Anicteric sclerae. Moist oral mucosa.  NECK: Supple. No thyromegaly. No JVD. No carotid bruits.  CHEST: Good air entry bilaterally. No wheezing, rales, rhonchi.  CARDIOVASCULAR: S1, S2 heard. No rubs, murmurs, or gallops. PMI nondisplaced.  ABDOMEN: Soft, nontender, nondistended. Bowel sounds present. No rebound, no guarding.  EXTREMITIES: No edema. No clubbing. No cyanosis. Pedal pulses +2 bilaterally. PSYCHIATRIC: Appropriate affect. Awake, alert x 3. Intact judgment and insight.  NEUROLOGIC: Grossly intact. Motor 5/5. No focal deficits.  MUSCULOSKELETAL: No joint effusion or erythema.  SKIN: Normal skin turgor. Warm and dry.  LYMPHATICS: No cervical lymphadenopathy could be appreciated.   PERTINENT LABORATORIES: Glucose 167, BUN 13, creatinine 1.06, sodium 132, potassium 4.2, chloride 93, CO2 30, lipase 1798. Troponin less than 0.02. White blood cells 19.2, hemoglobin 13.8, hematocrit 43.4, platelets 377,000. Urinalysis negative for leukocyte esterase and nitrite.   IMAGING STUDIES: Progressive phlegmonous inflammatory process in the upper abdomen and lower chest surrounding the esophagus, GE junction, stomach and pancreas. This is most likely, the sequelae of acute on chronic pancreatitis with numerous small pseudocyst small abscess. Cannot be excluded, cirrhosis with portal hypertension, portal vein with collaterals and esophageal varices.   ASSESSMENT AND PLAN:   1. Acute pancreatitis, recurrent. This is initially alcohol pancreatitis, but the patient currently does not drink anymore alcohol. As well the patient has evidence of phlegmonous changes on the CT, he be started on IV meropenem. Will keep him n.p.o. Will continue on hydration and will consult surgical service and gastroenterology service regarding his phlegmonous changes. as well, the patient reports recent hospitalization at [facility]. Unclear for which he reports there has been bruising around the throat area unclear what pathology he had. Requested the medical records from [facility]. Still awaiting medical records, as well he reports he had endoscopy and it is unclear if he had ERCP or EGD. Unclear if his pancreatitis was provoked due to this as well.  2.sepsis . The patient presents with leukocytosis, tachycardia and low-grade temperature, this is most likely related to his phlegmonous changes of the pancreas. We will continue with meropenem.  3. Type 2 diabetes. We will keep him on insulin sliding scale.  4. Gastroesophageal reflux disease. Continue with PPI.  5. Hypertension. Blood pressure is acceptable. We will continue with Nadolol due to his history of esophageal varices. Blood pressure acceptable.  6. Evidence of portal hypertension and varices on the x-ray. Will continue with nadolol.  7. Deep vein thrombosis prophylaxis. Subcutaneous heparin.   CODE STATUS: Full code.   TOTAL TIME SPENT ON ADMISSION AND PATIENT CARE: 55 minutes.    ____________________________ Albertine Patricia, MD dse:JT D: 02/25/2014 01:55:36 ET T: 02/25/2014 04:55:31 ET JOB#: [id number]  cc: Albertine Patricia, MD, <Dictator> Hendricks Schwandt Graciela Husbands MD ELECTRONICALLY SIGNED  02/26/2014 5:01 

## 2014-10-30 NOTE — Consult Note (Signed)
Brief Consult Note: Diagnosis: chronic calcific pancreatitis with acute exacerbation and multiple pseudosysts, h/o alcoholism.   Patient was seen by consultant.   Consult note dictated.   Recommend further assessment or treatment.   Orders entered.   Comments: He admits to oils in stool c/w malabsorption. no surgical indications at present. Will discussed with GI benefit of MRCP as he adamantly declines recent ETOH ingestion.  Electronic Signatures: Sherri Rad (MD)  (Signed 20-Aug-15 12:08)  Authored: Brief Consult Note   Last Updated: 20-Aug-15 12:08 by Sherri Rad (MD)

## 2014-10-30 NOTE — Consult Note (Signed)
Brief Consult Note: Diagnosis: Admitted for acute pancreatitis in the setting of known history fo chronic pancreatits.  History of alcohol abuse.  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 Esophageal varices.   Consult note dictated.   Discussed with Attending MD.   Comments: Patient's presentation will be discussed with Dr. Verdie Shire.  Discussed in great length with patient how to proceed with management of chronic pancreatitis as well as being newly diagnosed with cirrhosis and esophageal varices.  He needs to remain on low fat, low choleterol diet and avoid alcohol.  Noted history of anemia and feel probable secondary to cirrhosis.  Hemoglobin of 10 is ideal goal in the setting of esophageal varices.  He needs to remain on Nadolol as prescribed.    He should remain scheduled with gastroenterologist at [facility] to proceed with EUS as already discussed with him.  Do feel he would benefit from trial of pancreatic enzyme therapy.  Creon 36,000 units of lipase with directions two capsule with meals and one with snacks.  Will continue to monitor.  Electronic Signatures: Payton Emerald (NP)  (Signed 20-Aug-15 13:32)  Authored: Brief Consult Note   Last Updated: 20-Aug-15 13:32 by Payton Emerald (NP)

## 2014-10-30 NOTE — Consult Note (Signed)
PATIENT NAME:  [name], [name] MR#:  [id number] DATE OF BIRTH:  Dec 23, 1950  DATE OF CONSULTATION:  02/25/2014  CONSULTING PHYSICIAN:  Elta Guadeloupe A. [name], MD  HISTORY OF PRESENT ILLNESS:  This is a 64 year old male well known to me from previous left inguinal herniorrhaphy as well as taking care of his wife in the past. He has a history of alcohol abuse and dependence. He has a history of recurrent alcoholic-related pancreatitis and pseudocyst. I saw him approximately a year and a half ago, and he had a large pseudocyst in the tail of the pancreas, which, after some observation, resolved.  He most recently gets admitted to the medical service early this morning with recurrent abdominal pain in his epigastric region with radiation into his back. He does admit to starting insulin in the last year or so for diabetes. He also admits to having oily stools and loose stools consistent with a pancreatic insufficiency. He adamantly states that he has not drunk anything since January of this year. I confirmed this with his wife. Recently, the patient had some workup for a sore throat at [facility], records of which are pending and details of which are unclear to me at this point. He states he was diagnosed with a vitamin K deficiency. Again, details are awaiting transmission of medical records.  While in the emergency room,  a CT scan was performed, which demonstrates chronic pancreatitis as well as dilated pancreatic duct, pancreatic calcifications and multiple small pseudocysts, all of which are too small to drain and very early in appearance.  There is a atrophic appearance to the pancreas. There are no pancreatic air-fluid levels. No evidence of pancreatic necrosis or abscess. He did have a white count of 19.2, elevated lipase of nearly 1800. Surgical services were asked to comment regarding this pancreatic pseudocyst.   ALLERGIES: None.   MEDICATIONS: Lisinopril, sertraline, Lantus, NovoLog, allopurinol, niacin, nadolol,  cinnamon, iron sulfate, calcium citrate, magnesium, potassium, cyclobenzaprine, Carafate, omeprazole, Centrum Silver, and vitamin B6.   PAST MEDICAL HISTORY: 1.  Significant for type 1 diabetes, gastroesophageal reflux disease, hypertension, history of chronic alcoholism. 2.  History of alcohol-induced chronic and acute pancreatitis, history of Helicobacter pylori infection. History of multiple pseudocysts of the pancreas, history of esophageal grade 1 varices.  3.  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  SOCIAL HISTORY: He quit drinking in July 2014, although admits to drinking as early as December of 2014.  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   PAST SURGICAL HISTORY: Cholecystectomy for stones, tonsillectomy, left inguinal herniorrhaphy by me in January of this year.   FAMILY HISTORY: Significant for lung cancer.  REVIEW OF SYSTEMS:  As described above.   PHYSICAL EXAMINATION:  GENERAL: This patient is in no obvious distress, reading the newspaper.  He denies any current pain.  He is alert and oriented.  VITAL SIGNS: Temperature is 98.8, pulse of 77, respiratory rate of 18, blood pressure is 111/70, room air saturation is 97%.  LUNGS: Clear.  HEART: Regular rate and rhythm.  ABDOMEN: Soft and nontender. I can appreciate no mass, no fluid waves, no ascites, no caput medusae, no spider telangiectasias, no abdomen tenderness, no mass, no hernia.  EXTREMITIES: Warm and well perfused. HEENT: No signs of temporal wasting. SKIN:  No palmar erythema. RECTAL AND GENITOURINARY: Examination was deferred. NEUROLOGIC AND PSYCHIATRIC EXAMINATION: Normal.   LABORATORY VALUES: Urinalysis is negative. Glucose is 102, sodium 135, magnesium 1.9. Lipase is 761. Total bilirubin 1.3, alkaline phosphatase 142, AST  32, ALT 17. White count is 8.5, hemoglobin is 10.9, platelet count 226,000. Blood cultures are negative. I do not see the patient had coagulation  parameters.  DIAGNOSTIC DATA:  Review of CT scan as described above. There is concern over cirrhosis with portal hypertension with evidence of portal venous collaterals in the esophageal and gastrosplenic ligament.   IMPRESSION: This is a 65 year old male with recurrent acute-on-chronic calcific pancreatitis, likely related to prior history of alcohol abuse and dependence. At this point, I do not see any indication for surgical intervention.   Doubt if biliry stone related or medication related.  RECOMMENDATIONS: A low-fat diet. Initiation of pancreatic enzymes. GI medicine consultation. Long-term followup with them.   ____________________________ Jeannette How. [name], MD FACS mab:DT D: 02/25/2014 14:16:16 ET T: 02/25/2014 15:44:40 ET JOB#: [id number]  cc: Elta Guadeloupe A. [name], MD, <Dictator> Hortencia Conradi MD ELECTRONICALLY SIGNED 02/28/2014 14:49

## 2014-10-30 NOTE — Consult Note (Signed)
PATIENT NAME:  [name], [name] MR#:  [id number] DATE OF BIRTH:  02/22/1951  DATE OF CONSULTATION:  02/25/2014  ATTENDING PHYSICIAN: Dr. Elmer Ramp.  REFERRING PHYSICIAN:   CONSULTING PHYSICIAN:  Payton Emerald, NP  REASON FOR CONSULTATION: Acute pancreatitis, known history of chronic pancreatitis, and abdominal pain.   PRIMARY CARE PHYSICIAN:  [name]. Caryn Section, MD.  HISTORY OF PRESENT ILLNESS: The patient is a very pleasant 64 year old Caucasian gentleman with a known history of alcoholism. He has essentially remained abstinent from alcohol use for the past year and a half. He does state though that in April he drank 3 shots of bourbon for sore throat and maybe 2-3 weeks ago had a couple beers due to it being hot outside. In lieu of that, he has been diagnosed with chronic pancreatitis secondary to alcohol abuse. He has a known history of pseudocyst of the pancreas. Medical history is also significant for diabetes, GERD. The patient is newly diagnosed with cirrhosis, suspect probably secondary to alcohol abuse, though unknown complete etiological cause as he was diagnosed at [facility]. Last week he had presented due to extensive bruising and swelling to his throat and neck. He was seen locally by Dr. Tami Ribas and then transferred to [facility] and seen by ENT physicians there. Also had an EGD done and had a gastroenterology consultation. He was diagnosed with esophageal varices. He is currently taking Nadolol 20 mg twice a day at this time. He has an upcoming appointment to establish with a gastroenterologist at [facility] on September 10. That is to discuss of proceeding forward with endoscopic ultrasound, which has not been done in the past. The patient denies ever being placed either on pancreatic enzymes therapy.    Local gastroenterologist is Dr. Loistine Simas and followed by Stephens November, NP. He states that Sunday of  past week, he was experiencing a lack of energy, malaise. On Monday, it became  worse. He went to see Dr. Caryn Section on Tuesday as he was  also having some chest discomfort radiating through to his back. Had laboratory studies done, which were abnormal. He  followed up yesterday, states that the results were "worse," had declined, and thus he presented to [facility] to be admitted. He has had some nausea, which is currently resolved with antiemetic therapy as well as the abdominal pain which he was having in his upper abdomen. He does experience regurgitation of acid at time and belching phlegm-like substance. He has had an unintentional 12-15 pounds weight loss over the past month. He has had a poor appetite and "queasiness to his stomach."   No diarrhea. No constipation, though he has experienced steatorrhea. This has been new.   REVIEW OF SYSTEMS:  CONSTITUTIONAL: Significant for weakness, unintentional weight loss. No chills.  EYES: No blurred vision, double vision.  EARS, NOSE AND THROAT: No tinnitus, hearing loss, epistaxis.  RESPIRATORY: No shortness of breath, wheezing or cough.  CARDIOVASCULAR: See HPI. No syncope, near syncopal episodes. No falls, heart palpitations.  GASTROINTESTINAL: See HPI.  GENITOURINARY: No dysuria or hematuria.  ENDOCRINE: No polyuria, polydipsia, heat or cold intolerance.  HEMATOLOGIC: History of anemia. He is under the care of Dr. Inez Pilgrim. He does state that a  few months ago that his hemoglobin had dropped to 6 and was referred to Dr. Inez Pilgrim from our office. He has received an iron infusion and has been on ferrous sulfate taking 1 capsule twice a day since that time.  SKIN: No lesions. No rashes.  MUSCULOSKELETAL: No neuralgias,  myalgia.  NEUROLOGIC: No history of CVA or TIA.  PSYCHIATRIC: No depression. No anxiety.   PAST MEDICAL HISTORY: Diabetes mellitus type 2, gastroesophageal reflux disease, hypertension, chronic alcoholism, history of alcohol-induced pancreatitis. History of Helicobacter pylori. History of a pancreatic pseudocyst. Esophageal  varices. Documentation since grade 1, unsure if that is accurate as do not report to review at this time. Newly diagnosed with cirrhosis, unknown etiological cause, but do suspect possibly related to his history of alcoholism.   ALLERGIES: No known drug allergies.   SOCIAL HISTORY: Resides with his wife. No tobacco. No chronic alcohol use at this time and  very rare as stated under history. No recreational drug use.   PAST SURGICAL HISTORY: Tonsillectomy, cholecystectomy, inguinal hernia repair.   FAMILY HISTORY: History of lung cancer. No colon cancer. No pancreatic, hepatic concerns. Father, history of colonic polyps.   HOME MEDICATIONS: Lisinopril 10 mg once a day, Zoloft 100 mg once a day, Lantus 20 units subcutaneous at bedtime, NovoLog 3 times a day sliding scale. Allopurinol 100 mg once a day, niacin 500 mg a day, nadolol 20 mg twice a day. Cinnamon 1000 mg daily, ferrous fumarate  with iron. Polysaccharide 1 capsule twice a day. Calcium citrate 200 mg a day. Magnesium oxide 500 mg a day. Potassium gluconate 1 tablet daily. Cyclobenzaprine 10 mg 2 times daily as needed. Sucralfate 1 g 4 times a day before meals and bedtime. Omeprazole 40 mg daily. Centrum Silver 1 capsule daily. Vitamin B12 50 mcg once a day.   PHYSICAL EXAMINATION:  VITAL SIGNS: Temperature is 98.8, pulse 77, respirations 18, blood pressure is 111/70, pulse oximetry is 97% on room air.  GENERAL: Well-developed, well-nourished. No acute distress noted. Resting comfortably in bed. Evidence of the patient having eaten a regular diet today for lunch and he seems to have tolerated it well.  HEENT: Normocephalic, atraumatic. Pupils equal, round, reactive to light. Conjunctivae clear. Sclerae anicteric.  NECK: Supple. Trachea midline. No lymphadenopathy, thyromegaly.  PULMONARY: Symmetric rise and fall of chest. Clear to auscultation throughout.  CARDIOVASCULAR: Regular rhythm, S1, S2. No murmurs, no gallops.  ABDOMEN: Soft,  nondistended. Bowel sounds in all 4 quadrants. No bruits. No masses. No evidence of hepatosplenomegaly.  RECTAL: Deferred.  MUSCULOSKELETAL: Moving all 4 extremities. No contractures. No clubbing.  EXTREMITIES: No edema.  PSYCHIATRIC: Alert and oriented x4. Memory grossly intact. Appropriate affect and mood.  NEUROLOGICAL: No gross neurological deficits.   LABORATORY/DIAGNOSTIC DATA: Chemistry results are actually present from August 18 to refer back to lower sugar was 191. Potassium was 5.2, chloride is 96 and PO2 was 33 today. Glucose improved to 115. Sodium remains low at 135, but improved. Chloride is 96 and CO2 is 35, anion gap is low at 4. Calcium has dropped from 9.3 to 8.2, magnesium 1.9. On August 18,  amylase 446; August 19, 328. Lipase yesterday was 1798 and today is 761. Chemistry panel on 08/18 total protein is 7.8, albumin 3.6, total bilirubin is 1.5. Alkaline phosphatase 177, AST and ALT within normal limits. Albumin is 3.3 yesterday. Total bilirubin 1.3, alkaline phosphatase 142, AST is 32 and ALT is 17. CK total yesterday 402; CK-MB is 5.6 with troponin less than 0.02. On 08/19, WBC count was 19.3; today, it is 8.5. Hemoglobin on the 19th was 13.8 with hematocrit of 43.4; today 10.9 with hematocrit of 33.5. Blood cultures x2: No growth 8-12 hours.   URINALYSIS: +1 blood.   EKG shows normal sinus rhythm.   CT abdomen and pelvis  with contrast reveals a small right effusion is noted overlying atelectasis. Heart size is normal. Cirrhotic changes involving the liver, but no focal hepatic lesion, portal and hepatic veins are patent. There is progressive diffuse inflammatory phlegmonous process involving the esophagus EGD junction right diaphragmatic crus and posterior wall of the stomach. Multiple fluid collections are likely pseudocysts. Esophageal varices are noted. Extensive portal venous collaterals due to hypertension. Also enlarged periportal celiac access and peripancreatic lymph  nodes. Pancreas demonstrates changes with chronic pancreatic calcifications. Numerous calcifications marked atrophy noted. Stomach, duodenum, small bowel and colon are normal. Moderate atherosclerotic calcifications involving the aorta and iliac vessels.   IMPRESSIO: Admitted for abdominal pain, appearance of acute pancreatitis in the setting of known history of chronic pancreatitis. Newly diagnosed with cirrhosis as well. Newly diagnosed with esophageal varices. History of anemia and possible correlation with cirrhosis. Hemoglobin level of 10 is appropriate in the setting.   PLAN: The patient's presentation will be discussed with Dr. Verdie Shire. Do recommend that consideration for patient being discharged on pancreatic enzymes therapy, especially in the setting of evidence of steatorrhea. The patient would benefit from Creon 36,000 unit capsules, taking two with meals and one with snacks. The patient also should continue to proceed forward with establishing at [facility] to have EUS performed. He should remain on nadolol as prescribed. Do recommend though further management of cirrhosis on an outpatient basis. The patient needs to remain on a low-fat, low-cholesterol diet as well as avoidance of excessive salt. Discussed continued avoidance of alcohol and the importance in this setting. We will continue to monitor.   These services provided by Payton Emerald, MS, APRN, [facility], FNP, under collaborative agreement with Dr. Verdie Shire.    ____________________________ Payton Emerald, NP dsh:ls D: 02/25/2014 13:28:27 ET T: 02/25/2014 15:42:04 ET JOB#: [id number]  cc: Payton Emerald, NP, <Dictator> Payton Emerald MD ELECTRONICALLY SIGNED 02/25/2014 17:23

## 2014-10-30 NOTE — Op Note (Signed)
PATIENT NAME:  [name], [name] MR#:  [id number] DATE OF BIRTH:  07-23-1950  DATE OF PROCEDURE:  07/28/2013  PREOPERATIVE DIAGNOSIS: Symptomatic left inguinal hernia, indirect.   POSTOPERATIVE DIAGNOSIS:  Symptomatic left inguinal hernia, indirect.   PROCEDURE PERFORMED: Robotically-assisted laparoscopic transabdominal left inguinal herniorrhaphy with mesh.   SURGEON: Sherri Rad, M.D. FACS  ASSISTANT: [employer].  ANESTHESIA: General endotracheal and local.   FINDINGS: Large indirect inguinal hernia.   SPECIMENS: None.   ESTIMATED BLOOD LOSS: 25 mL.   DESCRIPTION OF PROCEDURE: With informed consent, supine position and general oral endotracheal anesthesia, the patient's abdomen was clipped of hair. Foley catheter was placed sterilely. Both arms were padded and tucked at his side and a timeout procedure was observed.   A 12 mm blunt Hassan trocar was placed through a supraumbilical, transversely-oriented skin incision with stay sutures being passed through the fascia under direct visualization. Pneumoperitoneum was established. The patient was then positioned in Trendelenburg. An 8.5 mm da Vinci trocar was placed in the right lateral abdomen at the line of the umbilicus, 10 cm lateral to the midline. An identical port was placed on the left side at the same location. A 10 mm trocar was then placed in the right upper quadrant as a first assistant port. Robot patient cart was then docked. Remote sensors were checked. Instruments were then placed into the operative field under direct visualization. The camera was secured. I then moved to the console.   Dissection was then undertaken by creating a peritoneal flap on the left side beginning at the medial umbilical ligament and working laterally above the hernia defect which was indirect in nature. Cooper's ligament was identified easily. Lateral to the cord structures, the muscles were then identified with identification of the nerve.   Dissection  was then undertaken of an indirect inguinal hernia sac which was able to be reduced fully back into the peritoneal cavity. Vas and vessels were identified and preserved. A Bard 3-D Max mesh, medium, measuring 8.5 cm x 13.7 cm was oriented as a left-sided hernia mesh, secured to Cooper's ligament at 2 points with #0 Vicryl suture. It was then secured to the anterior abdominal wall near the midline with several sutures. Two more sutures were then placed laterally. No sutures were placed in the area of the nerve. Quill stitch was then used to reapproximate the peritoneum starting from medial to lateral. The hernia sac was then tacked to the peritoneum with an additional #0 Vicryl suture.   I then returned to the patient's bedside. The robotic arms were undocked. Laparoscopic evaluation demonstrated no evidence of bowel injury and no bleeding. Lap and needle count was correct x 2. Ports were then removed under direct visualization. The supraumbilical fascial defect was reapproximated with several interrupted #0 Vicryl sutures in vertical orientation. A total of 30 mL of 0.25% plain Marcaine was infiltrated along all skin and fascial incisions prior to closure. Then 4-0 Vicryl was used to reapproximate skin edges in subcuticular fashion. Benzoin, Steri-Strips, Telfa and Tegaderm were then applied. The patient was subsequently extubated and Foley catheter was removed. He was transferred to the recovery room in stable and satisfactory condition by anesthesia services.     ____________________________ James How [name], MD FACS mab:cs D: 07/28/2013 18:00:07 ET T: 07/28/2013 19:40:24 ET JOB#: [id number]  cc: James [place] A. [name], MD, <Dictator> [name]. James Section, MD James Conradi MD ELECTRONICALLY SIGNED 08/02/2013 11:55

## 2014-10-30 NOTE — Consult Note (Signed)
Pt seen and examined. Please see Dawn Harrison's notes. Pt with newly diagnosed cirrhosis, portal HTN, and varices. Has chronic pancreatitis. Currently no abd pain or tenderness. Tolerating solids. [place] for discharge from GI point of view. Need to stay on low fat diet. May benefit from pancreatic enzyme supplements. No more alcohol emphasized again. Recent EGD report from [facility] still not available. Needs to keep appt at [facility]. Will sign off. Thanks.  Electronic Signatures: Verdie Shire (MD)  (Signed on 20-Aug-15 15:44)  Authored  Last Updated: 20-Aug-15 15:44 by Verdie Shire (MD)

## 2014-11-01 LAB — SURGICAL PATHOLOGY

## 2014-11-02 ENCOUNTER — Ambulatory Visit
Admit: 2014-11-02 | Disposition: A | Discharge status: Home / Self Care | Payer: Self-pay | Attending: Family Medicine | Admitting: Family Medicine

## 2014-11-02 LAB — BASIC METABOLIC PANEL
BUN: 7 mg/dL (ref 4–21)
Creatinine: 0.7 mg/dL (ref 0.6–1.3)
GLUCOSE: 161 mg/dL
SODIUM: 136 mmol/L — AB (ref 137–147)

## 2014-11-02 LAB — TSH: TSH: 1.99 u[IU]/mL (ref 0.41–5.90)

## 2014-11-02 LAB — CBC AND DIFFERENTIAL: WBC: 9.6 10*3/mL

## 2014-11-19 ENCOUNTER — Encounter: Payer: Self-pay | Admitting: Urgent Care

## 2014-11-22 ENCOUNTER — Other Ambulatory Visit: Payer: Self-pay | Admitting: Urgent Care

## 2014-11-22 DIAGNOSIS — K863 Pseudocyst of pancreas: Secondary | ICD-10-CM

## 2014-11-22 DIAGNOSIS — K7460 Unspecified cirrhosis of liver: Secondary | ICD-10-CM

## 2014-11-24 ENCOUNTER — Inpatient Hospital Stay: Admit: 2014-11-24 | Payer: Self-pay

## 2014-11-24 ENCOUNTER — Ambulatory Visit
Admission: RE | Admit: 2014-11-24 | Discharge: 2014-11-24 | Disposition: A | Discharge status: Home / Self Care | Payer: 59 | Source: Ambulatory Visit | Attending: Urgent Care | Admitting: Urgent Care

## 2014-11-24 DIAGNOSIS — K7460 Unspecified cirrhosis of liver: Secondary | ICD-10-CM

## 2014-11-24 DIAGNOSIS — K863 Pseudocyst of pancreas: Secondary | ICD-10-CM

## 2014-11-24 LAB — CBC WITH DIFFERENTIAL/PLATELET
BASOS ABS: 0 10*3/uL (ref 0–0.1)
BASOS PCT: 1 %
Eosinophils Absolute: 0.2 10*3/uL (ref 0–0.7)
Eosinophils Relative: 4 %
HEMATOCRIT: 43.2 % (ref 40.0–52.0)
Hemoglobin: 14.7 g/dL (ref 13.0–18.0)
LYMPHS PCT: 18 %
Lymphs Abs: 1 10*3/uL (ref 1.0–3.6)
MCH: 33.2 pg (ref 26.0–34.0)
MCHC: 33.9 g/dL (ref 32.0–36.0)
MCV: 97.9 fL (ref 80.0–100.0)
MONO ABS: 0.4 10*3/uL (ref 0.2–1.0)
Monocytes Relative: 8 %
NEUTROS ABS: 3.8 10*3/uL (ref 1.4–6.5)
Neutrophils Relative %: 69 %
PLATELETS: 154 10*3/uL (ref 150–440)
RBC: 4.42 MIL/uL (ref 4.40–5.90)
RDW: 16.4 % — ABNORMAL HIGH (ref 11.5–14.5)
WBC: 5.6 10*3/uL (ref 3.8–10.6)

## 2014-11-24 LAB — BASIC METABOLIC PANEL
Anion gap: 11 (ref 5–15)
BUN: 8 mg/dL (ref 6–20)
CHLORIDE: 96 mmol/L — AB (ref 101–111)
CO2: 31 mmol/L (ref 22–32)
Calcium: 9 mg/dL (ref 8.9–10.3)
Creatinine, Ser: 0.52 mg/dL — ABNORMAL LOW (ref 0.61–1.24)
GFR calc Af Amer: >60 mL/min (ref >60)
GFR calc non Af Amer: >60 mL/min (ref >60)
Glucose, Bld: 189 mg/dL — ABNORMAL HIGH (ref 65–99)
POTASSIUM: 3.6 mmol/L (ref 3.5–5.1)
Sodium: 138 mmol/L (ref 135–145)

## 2014-11-24 LAB — HEPATIC FUNCTION PANEL
ALK PHOS: 212 U/L — AB (ref 38–126)
ALT: 95 U/L — ABNORMAL HIGH (ref 17–63)
AST: 96 U/L — ABNORMAL HIGH (ref 15–41)
Albumin: 4.3 g/dL (ref 3.5–5.0)
BILIRUBIN DIRECT: 0.4 mg/dL (ref 0.1–0.5)
Indirect Bilirubin: 1.8 mg/dL — ABNORMAL HIGH (ref 0.3–0.9)
Total Bilirubin: 2.2 mg/dL — ABNORMAL HIGH (ref 0.3–1.2)
Total Protein: 7.3 g/dL (ref 6.5–8.1)

## 2014-11-24 LAB — PROTIME-INR
INR: 0.97
PROTHROMBIN TIME: 13.1 s (ref 11.4–15.0)

## 2014-11-24 LAB — FERRITIN: Ferritin: 95 ng/mL (ref 24–336)

## 2014-11-24 LAB — IRON: IRON: 197 ug/dL — AB (ref 45–182)

## 2014-11-24 LAB — ETHANOL: Alcohol, Ethyl (B): <5 mg/dL (ref <5)

## 2014-11-24 LAB — ACETAMINOPHEN LEVEL

## 2014-11-24 LAB — LIPASE, BLOOD: Lipase: 33 U/L (ref 22–51)

## 2014-11-25 LAB — HEPATITIS A ANTIBODY, TOTAL: HEP A TOTAL AB: NEGATIVE

## 2014-11-25 LAB — AFP TUMOR MARKER: AFP TUMOR MARKER: 1.8 ng/mL (ref 0.0–8.3)

## 2014-11-25 LAB — HEPATITIS B SURFACE ANTIBODY, QUANTITATIVE

## 2014-11-26 LAB — MISC LABCORP TEST (SEND OUT)
LABCORP TEST CODE: 140659
Labcorp test code: 6510

## 2014-11-29 ENCOUNTER — Other Ambulatory Visit: Payer: Self-pay | Admitting: Urgent Care

## 2014-11-29 ENCOUNTER — Other Ambulatory Visit: Payer: Self-pay | Admitting: Gastroenterology

## 2014-11-29 DIAGNOSIS — I82890 Acute embolism and thrombosis of other specified veins: Secondary | ICD-10-CM

## 2014-11-30 ENCOUNTER — Other Ambulatory Visit: Payer: Self-pay | Admitting: Urgent Care

## 2014-11-30 DIAGNOSIS — I82890 Acute embolism and thrombosis of other specified veins: Secondary | ICD-10-CM

## 2014-12-03 ENCOUNTER — Emergency Department: Payer: 59

## 2014-12-03 ENCOUNTER — Emergency Department
Admission: EM | Admit: 2014-12-03 | Discharge: 2014-12-03 | Disposition: A | Discharge status: Home / Self Care | Payer: 59 | Attending: Student | Admitting: Student

## 2014-12-03 DIAGNOSIS — S8002XA Contusion of left knee, initial encounter: Secondary | ICD-10-CM | POA: Diagnosis not present

## 2014-12-03 DIAGNOSIS — S8992XA Unspecified injury of left lower leg, initial encounter: Secondary | ICD-10-CM | POA: Diagnosis present

## 2014-12-03 DIAGNOSIS — E119 Type 2 diabetes mellitus without complications: Secondary | ICD-10-CM | POA: Insufficient documentation

## 2014-12-03 DIAGNOSIS — I10 Essential (primary) hypertension: Secondary | ICD-10-CM | POA: Insufficient documentation

## 2014-12-03 DIAGNOSIS — W1839XA Other fall on same level, initial encounter: Secondary | ICD-10-CM | POA: Diagnosis not present

## 2014-12-03 DIAGNOSIS — Y9289 Other specified places as the place of occurrence of the external cause: Secondary | ICD-10-CM | POA: Diagnosis not present

## 2014-12-03 DIAGNOSIS — Y998 Other external cause status: Secondary | ICD-10-CM | POA: Diagnosis not present

## 2014-12-03 DIAGNOSIS — S7012XA Contusion of left thigh, initial encounter: Secondary | ICD-10-CM | POA: Diagnosis not present

## 2014-12-03 DIAGNOSIS — S8012XA Contusion of left lower leg, initial encounter: Secondary | ICD-10-CM

## 2014-12-03 DIAGNOSIS — Y9389 Activity, other specified: Secondary | ICD-10-CM | POA: Diagnosis not present

## 2014-12-03 HISTORY — DX: Essential (primary) hypertension: I10

## 2014-12-03 HISTORY — DX: Abnormal levels of other serum enzymes: R74.8

## 2014-12-03 HISTORY — DX: Acute pancreatitis without necrosis or infection, unspecified: K85.90

## 2014-12-03 HISTORY — DX: Type 2 diabetes mellitus without complications: E11.9

## 2014-12-03 LAB — BASIC METABOLIC PANEL
Anion gap: 10 (ref 5–15)
BUN: 13 mg/dL (ref 6–20)
CO2: 25 mmol/L (ref 22–32)
Calcium: 8.7 mg/dL — ABNORMAL LOW (ref 8.9–10.3)
Chloride: 102 mmol/L (ref 101–111)
Creatinine, Ser: 0.64 mg/dL (ref 0.61–1.24)
GFR calc Af Amer: >60 mL/min (ref >60)
GLUCOSE: 285 mg/dL — AB (ref 65–99)
POTASSIUM: 4.3 mmol/L (ref 3.5–5.1)
Sodium: 137 mmol/L (ref 135–145)

## 2014-12-03 LAB — PROTIME-INR
INR: 1.1
Prothrombin Time: 14.4 seconds (ref 11.4–15.0)

## 2014-12-03 LAB — MISC LABCORP TEST (SEND OUT)

## 2014-12-03 LAB — CBC
HEMATOCRIT: 40.4 % (ref 40.0–52.0)
Hemoglobin: 13.5 g/dL (ref 13.0–18.0)
MCH: 33.3 pg (ref 26.0–34.0)
MCHC: 33.4 g/dL (ref 32.0–36.0)
MCV: 99.6 fL (ref 80.0–100.0)
Platelets: 205 10*3/uL (ref 150–440)
RBC: 4.06 MIL/uL — ABNORMAL LOW (ref 4.40–5.90)
RDW: 16.3 % — ABNORMAL HIGH (ref 11.5–14.5)
WBC: 7.7 10*3/uL (ref 3.8–10.6)

## 2014-12-03 LAB — APTT: aPTT: 26 seconds (ref 24–36)

## 2014-12-03 NOTE — ED Provider Notes (Signed)
[facility] Emergency Department Provider Note  ____________________________________________  Time seen: Approximately 3:03 PM  I have reviewed the triage vital signs and the nursing notes.   HISTORY  Chief Complaint Knee Pain and Leg Swelling    HPI [name] is a 64 y.o. male patient complaining of left knee pain secondary to a fall a week ago. Patient stated this been continuing edema and increased bruising of the upper part of left leg. Patient rates his painas a 5/10. Pain increases with ambulation. Patient denies any use of aspirin all blood thinners. Patient's chief concern is that descending ecchymosis from his thighs to his calf. X-rays were taken at urgent care clinic no acute findings.  Past Medical History  Diagnosis Date  . Hypertension   . Pancreatitis   . Diabetes mellitus without complication   . Elevated liver enzymes     There are no active problems to display for this patient.   Past Surgical History  Procedure Laterality Date  . Hernia repair    . Cholecystectomy    . Tonsillectomy      No current outpatient prescriptions on file.  Allergies Review of patient's allergies indicates no known allergies.  No family history on file.  Social History History  Substance Use Topics  . Smoking status: Never Smoker   . Smokeless tobacco: Never Used  . Alcohol Use: No    Review of Systems Constitutional: No fever/chills Eyes: No visual changes. ENT: No sore throat. Cardiovascular: Denies chest pain. Respiratory: Denies shortness of breath. Gastrointestinal: No abdominal pain.  No nausea, no vomiting.  No diarrhea.  No constipation. Genitourinary: Negative for dysuria. Musculoskeletal: Positive for left knee pain and edema.. Skin: Descended ecchymosis.  Neurological: Negative for headaches, focal weakness or numbness. 10-point ROS otherwise negative.  ____________________________________________   PHYSICAL  EXAM:  VITAL SIGNS: ED Triage Vitals  Enc Vitals Group     BP 12/03/14 1036 105/75 mmHg     Pulse Rate 12/03/14 1036 63     Resp 12/03/14 1036 18     Temp 12/03/14 1036 97.9 F (36.6 C)     Temp Source 12/03/14 1036 Oral     SpO2 12/03/14 1036 98 %     Weight 12/03/14 1036 164 lb (74.39 kg)     Height 12/03/14 1036 5\' 5"  (1.651 m)     Head Cir --      Peak Flow --      Pain Score 12/03/14 1037 5     Pain Loc --      Pain Edu? --      Excl. in [place]? --     Constitutional: Alert and oriented. Well appearing and in no acute distress. Eyes: Conjunctivae are normal. PERRL. EOMI. Head: Atraumatic. Nose: No congestion/rhinnorhea. Mouth/Throat: Mucous membranes are moist.  Oropharynx non-erythematous. Neck: No stridor. No cervical deformity. Equal range of motion nontender to palpation. Hematological/Lymphatic/Immunilogical: No cervical lymphadenopathy. Cardiovascular: Normal rate, regular rhythm. Grossly normal heart sounds.  Good peripheral circulation. Respiratory: Normal respiratory effort.  No retractions. Lungs CTAB. Gastrointestinal: Soft and nontender. No distention. No abdominal bruits. No CVA tenderness. Musculoskeletal: No deformity of the left lower extremity positive edema to the anterior patella with ecchymosis descending from the thigh to the medial aspect of the ankle. No joint effusions. Neurologic:  Normal speech and language. No gross focal neurologic deficits are appreciated. Speech is normal. No gait instability. Skin:  Skin is warm, dry and intact. Descended ecchymosis. Psychiatric: Mood and affect are  normal. Speech and behavior are normal.  ____________________________________________   LABS (all labs ordered are listed, but only abnormal results are displayed)  Labs Reviewed  CBC - Abnormal; Notable for the following:    RBC 4.06 (*)    RDW 16.3 (*)    All other components within normal limits  BASIC METABOLIC PANEL - Abnormal; Notable for the following:     Glucose, Bld 285 (*)    Calcium 8.7 (*)    All other components within normal limits  APTT  PROTIME-INR   ____________________________________________  EKG   ____________________________________________  RADIOLOGY   ____________________________________________   PROCEDURES  Procedure(s) performed: None  Critical Care performed: No  ____________________________________________   INITIAL IMPRESSION / ASSESSMENT AND PLAN / ED COURSE  Pertinent labs & imaging results that were available during my care of the patient were reviewed by me and considered in my medical decision making (see chart for details).   ____________________________________________   FINAL CLINICAL IMPRESSION(S) / ED DIAGNOSES  Final diagnoses:  Contusion of left anterior thigh, initial encounter  Traumatic ecchymosis of lower leg, left, initial encounter  Contusion of knee and lower leg, left, initial encounter       Sable Feil, PA-C 12/03/14 1530  Joanne Gavel, MD 12/03/14 1630

## 2014-12-03 NOTE — Discharge Instructions (Signed)
Contusion °A contusion is a deep bruise. Contusions happen when an injury causes bleeding under the skin. Signs of bruising include pain, puffiness (swelling), and discolored skin. The contusion may turn blue, purple, or yellow. °HOME CARE  °· Put ice on the injured area. °¨ Put ice in a plastic bag. °¨ Place a towel between your skin and the bag. °¨ Leave the ice on for 15-20 minutes, 03-04 times a day. °· Only take medicine as told by your doctor. °· Rest the injured area. °· If possible, raise (elevate) the injured area to lessen puffiness. °GET HELP RIGHT AWAY IF:  °· You have more bruising or puffiness. °· You have pain that is getting worse. °· Your puffiness or pain is not helped by medicine. °MAKE SURE YOU:  °· Understand these instructions. °· Will watch your condition. °· Will get help right away if you are not doing well or get worse. °Document Released: 12/12/2007 Document Revised: 09/17/2011 Document Reviewed: 04/30/2011 °[employer]® Patient Information ©2015 [employer]. This information is not intended to replace advice given to you by your health care provider. Make sure you discuss any questions you have with your health care provider. ° °

## 2014-12-03 NOTE — ED Notes (Signed)
Pt informed to return if any life threatening symptoms occur.  

## 2014-12-03 NOTE — ED Notes (Signed)
Pt states he fell a week ago and injuried his left knee, states since has had continued swelling with increased bruising of the upper left leg and lower leg with pain.[name] being on any blood thinners

## 2014-12-08 ENCOUNTER — Ambulatory Visit
Admission: RE | Admit: 2014-12-08 | Discharge: 2014-12-08 | Disposition: A | Discharge status: Home / Self Care | Payer: 59 | Source: Ambulatory Visit | Attending: Urgent Care | Admitting: Urgent Care

## 2014-12-08 ENCOUNTER — Other Ambulatory Visit: Payer: Self-pay | Admitting: Radiology

## 2014-12-08 DIAGNOSIS — I82890 Acute embolism and thrombosis of other specified veins: Secondary | ICD-10-CM

## 2014-12-08 DIAGNOSIS — K766 Portal hypertension: Secondary | ICD-10-CM | POA: Insufficient documentation

## 2014-12-08 DIAGNOSIS — Z8679 Personal history of other diseases of the circulatory system: Secondary | ICD-10-CM | POA: Insufficient documentation

## 2014-12-08 HISTORY — DX: Other chronic pancreatitis: K86.1

## 2014-12-08 HISTORY — DX: Alcohol dependence, uncomplicated: F10.20

## 2014-12-08 HISTORY — DX: Esophageal varices without bleeding: I85.00

## 2014-12-08 HISTORY — DX: Alcoholic cirrhosis of liver without ascites: K70.30

## 2014-12-08 MED ORDER — IOPAMIDOL (ISOVUE-300) INJECTION 61%
100.0000 mL | Freq: Once | INTRAVENOUS | Status: AC | PRN
Start: 2014-12-08 — End: 2014-12-08
  Administered 2014-12-08: 100 mL via INTRAVENOUS

## 2014-12-08 NOTE — Consult Note (Signed)
Chief Complaint: Chief Complaint  Patient presents with  . Advice Only    Consult for Splenic Vein Thrombosis/possible BRTO    Referring Physician(s): Jones,Kandice L  History of Present Illness: [name] is a 64 y.o. 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  [name] presents today for a discussion regarding his known splenic vein thrombosis, after I discussed the results of a recent abdominal ultrasound with his gastroenterologist Dr. Vickey Huger. At the time of interpretation, the patient had a CT examination from August 2015 demonstrating phlegmon of the upper abdomen related to an episode of pancreatitis. It was obvious that he had multiple venous collaterals of the upper abdomen, likely related to a splenic vein thrombosis. There was a question on this study of the existence of esophageal varices, with potentially developing gastric varices. There is also a question of conventional portal hypertension on imaging studies. Given the natural history of conventional portal hypertension and left-sided portal hypertension, and the fact that either frequently present with life-threatening variceal bleeding,  Dr. Ronnald Ramp referred [name] for establishing interventional radiology care and potential transjugular liver biopsy with pressure measurement.  Mr. [name] was admitted in August 2015 for upper abdominal pain related to an episode of pancreatitis. At that time he had undergone a recent upper endoscopy at [facility]. He is unsure of the results of the upper endoscopy and whether there was visualization of either esophageal varices or gastric varices.  Since August, he has significantly improved, now with no acute complaints, and with resolution of his abdominal pain and painful swallowing. He denies any prior episode of vomiting blood or gastrointestinal hemorrhage. He has  never been admitted in the hospital for an episode of melanoma or hematemesis.  He denies any alcohol use, having quit April 2015. He does report heavy alcohol use in the past, though none at this time. He denies any knowledge of a prior liver biopsy.  I discussed the pathology, pathophysiology, and natural history of both left-sided portal hypertension related to splenic vein thrombosis as well as conventional portal hypertension related to cirrhosis or liver disease. Specifically, I did discuss the risks of both of these conditions, and the potential benefit of diagnosing liver cirrhosis and conventional hypertension with hepatic venous pressures. If cirrhosis were confirmed, he may benefit from surveillance program for hepatocellular carcinoma.  CT study performed on today's date with a protocol specific for evaluation of both esophageal varices and gastric varices demonstrates a few interesting findings. There has been significant improvement in the inflammatory/phlegmon changes of the upper abdomen, with near complete resolution. There are still changes of chronic pancreatitis. There is persistent splenic vein thrombus. There are upper extremity portal to portal collaterals, extending from the hilum of the spleen through the gastroepiploic veins, which enter the superior mesenteric vein. No evidence of liver mass.  Past Medical History  Diagnosis Date  . Hypertension   . Pancreatitis   . Diabetes mellitus without complication   . Elevated liver enzymes   . Cirrhosis with alcoholism   . Chronic pancreatitis   . Alcoholism   . Esophageal varices     Past Surgical History  Procedure Laterality Date  . Hernia repair    . Cholecystectomy    . Tonsillectomy      Allergies: Review of patient's allergies indicates no known allergies.  Medications: Prior to Admission medications   Medication Sig Start Date End Date  Taking? Authorizing Provider  allopurinol (ZYLOPRIM) 100 MG tablet Take  100 mg by mouth daily.   Yes Historical Provider, MD  Calcium Citrate 200 MG TABS Take 200 tablets by mouth daily.   Yes Historical Provider, MD  cyclobenzaprine (FLEXERIL) 10 MG tablet Take 10 mg by mouth 2 (two) times daily.   Yes Historical Provider, MD  ferrous fumarate-iron polysaccharide complex (TANDEM) 162-115.2 MG CAPS Take 1 capsule by mouth daily with breakfast.   Yes Historical Provider, MD  insulin aspart (NOVOLOG) 100 UNIT/ML injection Inject 100 Units into the skin 3 (three) times daily before meals. Novolog (100 units/ml), 5-8 units subcutaneous tid wc, as needed per Sliding Scale.   Yes Historical Provider, MD  insulin glargine (LANTUS) 100 UNIT/ML injection Inject 20 Units into the skin at bedtime.   Yes Historical Provider, MD  lipase/protease/amylase (CREON) 12000 UNITS CPEP capsule Take 24,000 Units by mouth 3 (three) times daily with meals.   Yes Historical Provider, MD  lisinopril (PRINIVIL,ZESTRIL) 10 MG tablet Take 10 mg by mouth daily.   Yes Historical Provider, MD  magnesium gluconate (MAGONATE) 500 MG tablet Take 500 mg by mouth 2 (two) times daily.   Yes Historical Provider, MD  MULTIPLE VITAMIN PO Take 1 tablet by mouth.   Yes Historical Provider, MD  nadolol (CORGARD) 20 MG tablet Take 20 mg by mouth daily.   Yes Historical Provider, MD  niacin (NIASPAN) 500 MG CR tablet Take 500 mg by mouth at bedtime.   Yes Historical Provider, MD  omeprazole (PRILOSEC) 40 MG capsule Take 40 mg by mouth 2 (two) times daily.   Yes Historical Provider, MD  Potassium Gluconate 550 MG TABS Take 1 tablet by mouth daily.   Yes Historical Provider, MD  Pyridoxine HCl (VITAMIN B-6) 500 MG tablet Take 500 mg by mouth daily.   Yes Historical Provider, MD  sertraline (ZOLOFT) 100 MG tablet Take 100 mg by mouth daily.   Yes Historical Provider, MD  sucralfate (CARAFATE) 1 G tablet Take 1 g by mouth 4 (four) times daily -  with meals and at bedtime.   Yes Historical Provider, MD  zinc gluconate  50 MG tablet Take 50 mg by mouth daily.   Yes Historical Provider, MD     No family history on file.  History   Social History  . Marital Status: Married    Spouse Name: N/A  . Number of Children: N/A  . Years of Education: N/A   Social History Main Topics  . Smoking status: Never Smoker   . Smokeless tobacco: Never Used  . Alcohol Use: No  . Drug Use: No  . Sexual Activity: Yes   Other Topics Concern  . Not on file   Social History Narrative     Review of Systems: A 12 point ROS discussed and pertinent positives are indicated in the HPI above.  All other systems are negative.  Review of Systems  Vital Signs: BP 158/97 mmHg  Pulse 80  Temp(Src) 98.7 F (37.1 C) (Oral)  Resp 14  Ht 5\' 5"  (1.651 m)  Wt 165 lb (74.844 kg)  BMI 27.46 kg/m2  SpO2 99%  Physical Exam   Atraumatic, normocephalic, mucous membranes moist pink. Wearing glasses. Conjugate gaze. No icterus or scleral injection. Range of motion of the neck is within normal limits. No adenopathy. Neck is soft and supple. Clear to auscultation bilaterally. No wheezes rhonchi or rails. Regular rate and rhythm. No third heart sounds appreciated. Abdomen soft nontender. No muscle  guarding or rigidity. Genitourinary deferred. 4 extremities with gross motor intact. Gross sensory intact. Alert and oriented to person place and time. Appropriate affect and insight. Appropriate questions.  Mallampati Score:  2  Imaging: US Abdomen Complete  11/24/2014   CLINICAL DATA:  64 year old male with a history of chronic pancreatitis.  EXAM: ULTRASOUND ABDOMEN COMPLETE; ARTERIAL AND VENOUS ULTRASOUND OF THE ABDOMEN PELVIS  COMPARISON:  Prior CT 02/24/2014  FINDINGS: Gallbladder: Cholecystectomy  Common bile duct: Diameter: 2.3 mm  Liver: Diffusely heterogeneous echotexture of the liver parenchyma. No significant nodularity of the liver surface.  IVC: No abnormality visualized.  Pancreas: Heterogeneous appearance of the  visualized pancreas, which is atrophic and partially calcified on comparison CT.  Spleen: Greatest measured diameter of the spleen 13.7 cm which is enlarged.  Right Kidney: Length: 10.7 cm. Echogenicity within normal limits. No mass or hydronephrosis visualized.  Left Kidney: Length: 11.4 cm. Echogenicity within normal limits. No mass or hydronephrosis visualized.  Abdominal aorta: No aneurysm visualized.  Doppler:  Portal Vein Velocities  Main:  24.7 cm/sec, hepatopetal  Hepatic Vein Velocities  Right:  20.9 cm/sec  Middle:  60.6 cm/sec  Left:  18.3 cm/sec  Hepatic Artery Velocity: 30.7 cm/sec  Splenic Vein Velocity: 12.3 cm/sec  Varices: Collateral veins of the upper abdomen present, similar to comparison CT. No varices were evident.  Ascites: None  IMPRESSION: Heterogeneous appearance of the liver compatible with steatosis.  Hepatopetal flow of the portal vein.  Signed,  Dulcy Fanny. Earleen Newport, DO  [facility]  [facility]   Electronically Signed   By: Corrie Mckusick D.O.   On: 11/24/2014 12:00   US Venous Img Lower Unilateral Left  12/03/2014   CLINICAL DATA:  Left lower extremity swelling, pain, recent knee injury 11/25/2014  EXAM: LEFT LOWER EXTREMITY VENOUS DOPPLER ULTRASOUND  TECHNIQUE: Gray-scale sonography with graded compression, as well as color Doppler and duplex ultrasound were performed to evaluate the lower extremity deep venous systems from the level of the common femoral vein and including the common femoral, femoral, profunda femoral, popliteal and calf veins including the posterior tibial, peroneal and gastrocnemius veins when visible. The superficial great saphenous vein was also interrogated. Spectral Doppler was utilized to evaluate flow at rest and with distal augmentation maneuvers in the common femoral, femoral and popliteal veins.  COMPARISON:  None.  FINDINGS: Contralateral Common Femoral Vein: Respiratory phasicity is normal and symmetric with  the symptomatic side. No evidence of thrombus. Normal compressibility.  Common Femoral Vein: No evidence of thrombus. Normal compressibility, respiratory phasicity and response to augmentation.  Saphenofemoral Junction: No evidence of thrombus. Normal compressibility and flow on color Doppler imaging.  Profunda Femoral Vein: No evidence of thrombus. Normal compressibility and flow on color Doppler imaging.  Femoral Vein: No evidence of thrombus. Normal compressibility, respiratory phasicity and response to augmentation.  Popliteal Vein: No evidence of thrombus. Normal compressibility, respiratory phasicity and response to augmentation.  Calf Veins: No evidence of thrombus. Normal compressibility and flow on color Doppler imaging.  Superficial Great Saphenous Vein: No evidence of thrombus. Normal compressibility and flow on color Doppler imaging.  Venous Reflux:  None.  Other Findings:  None.  IMPRESSION: No evidence of deep venous thrombosis.   Electronically Signed   By: Jerilynn Mages.  Shick M.D.   On: 12/03/2014 12:09   [place] Art/ven Flow Abd Pelv Doppler  11/24/2014   CLINICAL DATA:  64 year old male with a history of chronic pancreatitis.  EXAM: ULTRASOUND ABDOMEN COMPLETE; ARTERIAL AND  VENOUS ULTRASOUND OF THE ABDOMEN PELVIS  COMPARISON:  Prior CT 02/24/2014  FINDINGS: Gallbladder: Cholecystectomy  Common bile duct: Diameter: 2.3 mm  Liver: Diffusely heterogeneous echotexture of the liver parenchyma. No significant nodularity of the liver surface.  IVC: No abnormality visualized.  Pancreas: Heterogeneous appearance of the visualized pancreas, which is atrophic and partially calcified on comparison CT.  Spleen: Greatest measured diameter of the spleen 13.7 cm which is enlarged.  Right Kidney: Length: 10.7 cm. Echogenicity within normal limits. No mass or hydronephrosis visualized.  Left Kidney: Length: 11.4 cm. Echogenicity within normal limits. No mass or hydronephrosis visualized.  Abdominal aorta: No aneurysm  visualized.  Doppler:  Portal Vein Velocities  Main:  24.7 cm/sec, hepatopetal  Hepatic Vein Velocities  Right:  20.9 cm/sec  Middle:  60.6 cm/sec  Left:  18.3 cm/sec  Hepatic Artery Velocity: 30.7 cm/sec  Splenic Vein Velocity: 12.3 cm/sec  Varices: Collateral veins of the upper abdomen present, similar to comparison CT. No varices were evident.  Ascites: None  IMPRESSION: Heterogeneous appearance of the liver compatible with steatosis.  Hepatopetal flow of the portal vein.  Signed,  Dulcy Fanny. Earleen Newport, DO  [facility]  [facility]   Electronically Signed   By: Corrie Mckusick D.O.   On: 11/24/2014 12:00   Ct Abd Wo & W Cm  12/08/2014   CLINICAL DATA:  Initial encounter for splenic vein thrombosis.  EXAM: CT ABDOMEN WITHOUT AND WITH CONTRAST  TECHNIQUE: Multidetector CT imaging of the abdomen was performed following the standard protocol before and following the bolus administration of intravenous contrast.  CONTRAST:  128mL ISOVUE-300 IOPAMIDOL (ISOVUE-300) INJECTION 61%  COMPARISON:  Ultrasound exam from 11/24/2014. CT scan from 02/24/2014.  FINDINGS: Lower chest: Nonacute anterior lower right rib fractures noted. Lung bases unremarkable.  Hepatobiliary: Liver measures 21.3 cm in craniocaudal length. No focal abnormality within the liver parenchyma. Dystrophic calcification in the central liver suggest prior granulomatous disease. Gallbladder is surgically absent. No intrahepatic or extrahepatic biliary dilation. Mesenteric  Pancreas: Pancreatic parenchyma is diffusely atrophic with scattered parenchymal calcification. No dilatation of the main duct. No focal mass lesion.  Spleen: Lung spleen is 14.9 cm in craniocaudal length. Chronic occlusion of the splenic vein noted with substantial left upper quadrant venous collateralization. Substantial perigastric collateralization is evident. No evidence for paraesophageal varices by CT.  Adrenals/Urinary Tract: No adrenal  nodule or mass. 1 mm nonobstructing stone is identified in the lower pole the right kidney (image 39 series 2. No enhancing lesion in either kidney. No hydronephrosis.  Stomach/Bowel: Mild circumferential wall thickening noted in the distal esophagus. Stomach otherwise unremarkable. Duodenum is normally positioned as is the ligament of Treitz.  Vascular/Lymphatic: No abdominal aortic aneurysm. Portal vein is patent. Superior mesenteric vein is patent. Please see spleen section above for discussion of splenic vein and left upper quadrant venous collateralization.  Other: No intraperitoneal free fluid.  Musculoskeletal: Bone windows reveal no worrisome lytic or sclerotic osseous lesions.  IMPRESSION: Hepatomegaly with borderline splenomegaly. Chronic splenic vein occlusion noted with subsequent prominent left upper quadrant venous collateralization.  Changes in the pancreas consistent with chronic pancreatitis.  1 mm nonobstructing stone in the right kidney.   Electronically Signed   By: Misty Stanley M.D.   On: 12/08/2014 11:36    Labs:  CBC:  Recent Labs  02/25/14 0410 04/27/14 1452 11/24/14 1103 12/03/14 1043  WBC 8.5 6.3 5.6 7.7  HGB 10.9* 11.4* 14.7 13.5  HCT 33.5* 36.0* 43.2 40.4  PLT 226 279 154 205    COAGS:  Recent Labs  04/27/14 1452 11/24/14 1103 12/03/14 1043  INR 1.0 0.97 1.10  APTT  --   --  26    BMP:  Recent Labs  02/24/14 1443 02/25/14 0410 04/27/14 1452 11/24/14 1103 12/03/14 1043  NA 132* 135* 136 138 137  K 4.2 3.9 4.5 3.6 4.3  CL 93* 96* 97* 96* 102  CO2 30 35* 30 31 25   GLUCOSE 167* 115* 264* 189* 285*  BUN 13 10 9 8 13   CALCIUM 8.7 8.2* 8.7 9.0 8.7*  CREATININE 1.06 0.93 0.78 0.52* 0.64  GFRNONAA >60 >60  --  >60 >60  GFRAA >60 >60  --  >60 >60    LIVER FUNCTION TESTS:  Recent Labs  02/23/14 1508 02/24/14 1443 11/24/14 1103  BILITOT  --   --  2.2*  AST 35 32 96*  ALT 18 17 95*  ALKPHOS 177* 142* 212*  PROT 7.8 7.2 7.3  ALBUMIN 3.6  3.3* 4.3    TUMOR MARKERS:  Recent Labs  11/24/14 1103  AFPTM 1.8    Assessment and Plan:  Mr. Aceituno is a 64 year old gentleman with a history of chronic pancreatitis and recent admission in August 2015 for acute pancreatitis episode, attributed at that time to gallstone pancreatitis. His last alcohol use was April 2015, with a history of chronic abuse.  He has imaging diagnosis of splenic vein thrombus, with resulting left-sided portal hypertension contributing to multiple portal to portal collaterals. CT scan acquired on today's date with B RTO protocol demonstrates significant improvement of the inflammatory changes of the upper abdomen, as well as no evidence of esophageal or gastric varices on the CT. Questionable changes of cirrhosis, with prior imaging diagnosis of cirrhosis.  I believe Mr. Towery would benefit from transjugular liver biopsy for confirmation of cirrhosis or to rule it out, as the presence of cirrhosis could prompt surveillance for [facility]. In addition, knowing whether he has portal hypertension related to cirrhosis would be useful for any future treatment planning in the setting of variceal development / hemorrhage.   I had a thorough conversation with Mr. Simonich regarding both left-sided portal hypertension and conventional hypertension related to liver disease, as well as the benefit of confirming cirrhosis and portal hypertension. I gave him a full informed consent regarding a transjugular liver biopsy with pressure measurement. Specific risks that I quoted include bleeding, infection, liver injury, air embolus, contrast reaction, kidney injury, need for further surgery/procedure, cardiopulmonary collapse, death. All of his questions were answered to the best of my ability. He would like to proceed with the biopsy.  Dr. Ronnald Ramp and I discussed his case at the time of the interview.  Thank you for this interesting consult.  I greatly enjoyed meeting HAMZEH TALL and look forward  to participating in his care.  SignedCorrie Mckusick 12/08/2014, 12:31 PM   I spent a total of  45 Minutes   in face to face in clinical consultation, greater than 50% of which was counseling/coordinating care for splenic vein thrombosis with resulting left-sided portal hypertension, potential liver cirrhosis, potential cirrhosis related hypertension, and diagnostic transjugular liver biopsy.

## 2014-12-12 ENCOUNTER — Telehealth: Payer: Self-pay | Admitting: Urgent Care

## 2014-12-12 NOTE — Telephone Encounter (Signed)
Pt needs appt 6/14 or after to discuss liver biopsy & labs & setup EGD & possibly colonoscopy.   Please arrange & be sure Bx is in EPIC prior to appt Thanks

## 2014-12-14 NOTE — Telephone Encounter (Signed)
Please call IR to see when transjugular biopsy has been scheduled.  Do we need to send order for this or do they have what they need from his consult? Thanks

## 2014-12-17 NOTE — Telephone Encounter (Signed)
Called IR at [facility] at [phone] and had to leave a vm.

## 2014-12-20 ENCOUNTER — Telehealth: Payer: Self-pay

## 2014-12-20 DIAGNOSIS — K7460 Unspecified cirrhosis of liver: Secondary | ICD-10-CM

## 2014-12-20 NOTE — Telephone Encounter (Signed)
Vicky called me back to let me know that [facility] would contact patient to schedule his biopsy.

## 2014-12-20 NOTE — Telephone Encounter (Signed)
Vicky from [place] called me letting me know that [name] has not called patient to schedule an appt for his biopsy. She stated that he would be contacted soon, she hoped.

## 2014-12-22 NOTE — Telephone Encounter (Signed)
Spoke with patient.  He has not made appt for liver biopsy yet.

## 2014-12-22 NOTE — Addendum Note (Signed)
Addended by: Andria Meuse on: 12/22/2014 01:32 PM   Modules accepted: Orders

## 2014-12-22 NOTE — Telephone Encounter (Signed)
Ultrasound returned call & asked me to place [id number] liver bx order & they will call back with a date & time. Thanks

## 2014-12-22 NOTE — Telephone Encounter (Signed)
Spoke with radiology/Ultrasound & pt info given.  They will call back if they need [place] to put in orders for liver bx [id number]

## 2014-12-24 ENCOUNTER — Other Ambulatory Visit: Payer: Self-pay | Admitting: Radiology

## 2014-12-27 ENCOUNTER — Other Ambulatory Visit: Payer: Self-pay | Admitting: Urgent Care

## 2014-12-27 ENCOUNTER — Telehealth: Payer: Self-pay | Admitting: Urgent Care

## 2014-12-27 ENCOUNTER — Telehealth: Payer: Self-pay

## 2014-12-27 ENCOUNTER — Ambulatory Visit
Admission: RE | Admit: 2014-12-27 | Discharge: 2014-12-27 | Disposition: A | Discharge status: Home / Self Care | Payer: 59 | Source: Ambulatory Visit | Attending: Urgent Care | Admitting: Urgent Care

## 2014-12-27 DIAGNOSIS — K766 Portal hypertension: Secondary | ICD-10-CM

## 2014-12-27 MED ORDER — SODIUM CHLORIDE 0.9 % IV SOLN: Freq: Once | INTRAVENOUS | Status: DC

## 2014-12-27 NOTE — Telephone Encounter (Signed)
[name] from [facility] called stating that [name] did not show up to his appt for liver biopsy last week. [name] has tried several times calling patient to reschedule and he does not return calls. [name] also stated that if you want [name] to reschedule his liver biopsy, he should call [phone].

## 2014-12-27 NOTE — Telephone Encounter (Signed)
Called [name] from [facility] back letting her know that Mr. Mesta could have his transjugular liver biopsy done at either [facility] or [place]. [name] stated that she would call Mr. Gruner to decide where he wants his biopsy done.

## 2014-12-27 NOTE — Telephone Encounter (Signed)
Pt can go either place whichever he wants. Thanks

## 2014-12-27 NOTE — Telephone Encounter (Signed)
Brennan Bailey wrote Rx for patient to have his transjugular liver biopsy scheduled to [phone] and received confirmation from the fax that it was done.

## 2014-12-27 NOTE — Telephone Encounter (Signed)
Spoke with [name], no order in epic yet for TJ liver biopsy.  Please fax RX with order to [phone] THanks

## 2014-12-27 NOTE — Telephone Encounter (Signed)
[name] from [facility] called and stated that Pt has been advised of the transjugular liver bx that is scheduled for 01/07/15 @ 9:30. Pt informed to arrive at 8:00 for appt. Vickey Huger will need to add orders.

## 2014-12-27 NOTE — Telephone Encounter (Signed)
Please read previous note too.  [name] from [facility] called again stating that she was finally able to speak with the patient. She stated that according to the patient, he was under the impression that he was getting a transjugular liver biopsy at [facility]. Patient stated that since he saw Dr. Jacqualyn Posey at [place] than he needed to have it done at [facility]. However, [name] stated that he could have this biopsy at [facility] but it would take about two weeks to have it done. Please let me know what you would want to do. Thanks.

## 2014-12-27 NOTE — Telephone Encounter (Signed)
Please see other note from 12/27/2014.

## 2015-01-06 ENCOUNTER — Other Ambulatory Visit: Payer: Self-pay | Admitting: Radiology

## 2015-01-07 ENCOUNTER — Ambulatory Visit (HOSPITAL_COMMUNITY)
Admission: RE | Admit: 2015-01-07 | Discharge: 2015-01-07 | Disposition: A | Discharge status: Home / Self Care | Payer: 59 | Source: Ambulatory Visit | Attending: Urgent Care | Admitting: Urgent Care

## 2015-01-07 DIAGNOSIS — Z794 Long term (current) use of insulin: Secondary | ICD-10-CM | POA: Insufficient documentation

## 2015-01-07 DIAGNOSIS — I10 Essential (primary) hypertension: Secondary | ICD-10-CM | POA: Insufficient documentation

## 2015-01-07 DIAGNOSIS — Z79899 Other long term (current) drug therapy: Secondary | ICD-10-CM | POA: Diagnosis not present

## 2015-01-07 DIAGNOSIS — K766 Portal hypertension: Secondary | ICD-10-CM | POA: Diagnosis present

## 2015-01-07 DIAGNOSIS — E119 Type 2 diabetes mellitus without complications: Secondary | ICD-10-CM | POA: Diagnosis not present

## 2015-01-07 DIAGNOSIS — D735 Infarction of spleen: Secondary | ICD-10-CM | POA: Diagnosis not present

## 2015-01-07 DIAGNOSIS — F1021 Alcohol dependence, in remission: Secondary | ICD-10-CM | POA: Diagnosis not present

## 2015-01-07 DIAGNOSIS — K760 Fatty (change of) liver, not elsewhere classified: Secondary | ICD-10-CM | POA: Diagnosis not present

## 2015-01-07 LAB — CBC
HEMATOCRIT: 41.3 % (ref 40.0–52.0)
Hemoglobin: 13.9 g/dL (ref 13.0–18.0)
MCH: 33.8 pg (ref 26.0–34.0)
MCHC: 33.6 g/dL (ref 32.0–36.0)
MCV: 100.5 fL — ABNORMAL HIGH (ref 80.0–100.0)
Platelets: 219 10*3/uL (ref 150–440)
RBC: 4.11 MIL/uL — ABNORMAL LOW (ref 4.40–5.90)
RDW: 13.2 % (ref 11.5–14.5)
WBC: 5.5 10*3/uL (ref 3.8–10.6)

## 2015-01-07 LAB — GLUCOSE, CAPILLARY: Glucose-Capillary: 128 mg/dL — ABNORMAL HIGH (ref 65–99)

## 2015-01-07 LAB — APTT: APTT: 30 s (ref 24–36)

## 2015-01-07 LAB — PROTIME-INR
INR: 1.16
PROTHROMBIN TIME: 15 s (ref 11.4–15.0)

## 2015-01-07 MED ORDER — FENTANYL CITRATE (PF) 100 MCG/2ML IJ SOLN
INTRAMUSCULAR | Status: AC | PRN
  Administered 2015-01-07: 50 ug via INTRAVENOUS

## 2015-01-07 MED ORDER — MIDAZOLAM HCL 2 MG/2ML IJ SOLN
INTRAMUSCULAR | Status: AC | PRN
  Administered 2015-01-07 (×2): 1 mg via INTRAVENOUS

## 2015-01-07 MED ORDER — SODIUM CHLORIDE 0.9 % IV SOLN
Freq: Once | INTRAVENOUS | Status: AC
  Administered 2015-01-07: 09:00:00 via INTRAVENOUS

## 2015-01-07 MED ORDER — IOHEXOL 300 MG/ML  SOLN: 25.0000 mL | Freq: Once | INTRAMUSCULAR | Status: AC | PRN

## 2015-01-07 NOTE — Discharge Instructions (Signed)
The drugs you were given will stay in your system until tomorrow, so for the next 24 hours you should not.  Drive an automobile. Make any legal decisions.  Drink any alcoholic beverages.  Today you should start with liquids and gradually work up to solid foods as your are able to tolerate them  Resume your regular medications as prescribed by your doctor.  Avoid aspirin for 24 hours.    Change the Band-Aid or dressing as needed.  After a 2 days no dressing as needed.  Avoid strenuous activity for the remainder of the day.  Please notify your primary physician immediately if you have any unusual bleeding, trouble breathing, fever >100 degrees or pain not relieved by the medication your doctor prescribed for your doctor prescribed for you physician  Dr. Leigh Aurora office to call patient with results of test samples

## 2015-01-07 NOTE — Procedures (Signed)
Interventional Radiology Procedure Note  Procedure: Transjugular liver biopsy, hepatic venogram and pressure measurements.   Complications: None immediate  Estimated Blood Loss: 0  Recommendations: - Bedrest x 3 hrs - Pathology sample is pending - Will observe   Signed,  Criselda Peaches, MD

## 2015-01-12 LAB — SURGICAL PATHOLOGY

## 2015-01-14 ENCOUNTER — Other Ambulatory Visit: Payer: Self-pay

## 2015-01-14 NOTE — Progress Notes (Signed)
Appt 7-11 with [facility]

## 2015-01-17 ENCOUNTER — Encounter: Payer: Self-pay | Admitting: Gastroenterology

## 2015-01-17 ENCOUNTER — Other Ambulatory Visit: Payer: Self-pay | Admitting: Gastroenterology

## 2015-01-17 ENCOUNTER — Ambulatory Visit (INDEPENDENT_AMBULATORY_CARE_PROVIDER_SITE_OTHER): Payer: 59 | Admitting: Gastroenterology

## 2015-01-17 VITALS — BP 132/84 | HR 95 | Temp 98.9°F | Ht 66.0 in | Wt 171.0 lb

## 2015-01-17 DIAGNOSIS — I81 Portal vein thrombosis: Secondary | ICD-10-CM

## 2015-01-17 NOTE — Progress Notes (Signed)
Primary Care Physician: Lelon Huh, MD  Primary Gastroenterologist:  Dr. Lucilla Lame  Chief Complaint  Patient presents with  . F/U biopsy results    HPI: [name] is a 64 y.o. male here for follow-up after liver biopsy. The patient had a liver biopsy with pressure measurements. The pressure gradient was at upper limit of normal at 6. The patient has no complaints today. The patient's biopsy should show prostate 30% of the liver being fatty without any signs of fibrosis or cirrhosis. The patient does have a history of portal vein thrombosis.  Current Outpatient Prescriptions  Medication Sig Dispense Refill  . allopurinol (ZYLOPRIM) 100 MG tablet Take 100 mg by mouth daily.    . Calcium Citrate 200 MG TABS Take 200 tablets by mouth daily.    . cyclobenzaprine (FLEXERIL) 10 MG tablet Take 10 mg by mouth 2 (two) times daily.    . insulin aspart (NOVOLOG) 100 UNIT/ML injection Inject 8 Units into the skin 3 (three) times daily before meals. Novolog (100 units/ml), 5-8 units subcutaneous tid wc, as needed per Sliding Scale.    . insulin glargine (LANTUS) 100 UNIT/ML injection Inject 20 Units into the skin at bedtime.    . lipase/protease/amylase (CREON) 12000 UNITS CPEP capsule Take 24,000 Units by mouth 3 (three) times daily with meals.    Marland Kitchen lisinopril (PRINIVIL,ZESTRIL) 10 MG tablet Take 10 mg by mouth daily.    . magnesium gluconate (MAGONATE) 500 MG tablet Take 500 mg by mouth 2 (two) times daily.    . Multiple Vitamin (VITAMIN E/FOLIC ZSWF/U-9/N-23 PO) Take 50 mg by mouth daily.    . niacin (NIASPAN) 500 MG CR tablet Take 500 mg by mouth at bedtime.    Marland Kitchen omeprazole (PRILOSEC) 40 MG capsule Take 40 mg by mouth 2 (two) times daily.    . Potassium Gluconate 550 MG TABS Take 1 tablet by mouth daily.    . Pyridoxine HCl (VITAMIN B-6) 500 MG tablet Take 500 mg by mouth daily.    . sertraline (ZOLOFT) 100 MG tablet Take 100 mg by mouth daily.    . sucralfate (CARAFATE) 1 G tablet Take  1 g by mouth as needed.     . zinc gluconate 50 MG tablet Take 50 mg by mouth daily.    . ferrous fumarate-iron polysaccharide complex (TANDEM) 162-115.2 MG CAPS Take 1 capsule by mouth daily with breakfast.    . MULTIPLE VITAMIN PO Take 1 tablet by mouth.    . nadolol (CORGARD) 20 MG tablet Take 20 mg by mouth daily.     No current facility-administered medications for this visit.    Allergies as of 01/17/2015  . (No Known Allergies)    ROS:  General: Negative for anorexia, weight loss, fever, chills, fatigue, weakness. ENT: Negative for hoarseness, difficulty swallowing , nasal congestion. CV: Negative for chest pain, angina, palpitations, dyspnea on exertion, peripheral edema.  Respiratory: Negative for dyspnea at rest, dyspnea on exertion, cough, sputum, wheezing.  GI: See history of present illness. GU:  Negative for dysuria, hematuria, urinary incontinence, urinary frequency, nocturnal urination.  Endo: Negative for unusual weight change.    Physical Examination:   BP 132/84 mmHg  Pulse 95  Temp(Src) 98.9 F (37.2 C) (Oral)  Ht 5\' 6"  (1.676 m)  Wt 171 lb (77.565 kg)  BMI 27.61 kg/m2  General: Well-nourished, well-developed in no acute distress.  Eyes: No icterus. Conjunctivae pink. Mouth: Oropharyngeal mucosa moist and pink , no lesions erythema or exudate.  Lungs: Clear to auscultation bilaterally. Non-labored. Heart: Regular rate and rhythm, no murmurs rubs or gallops.  Abdomen: Bowel sounds are normal, nontender, nondistended, no hepatosplenomegaly or masses, no abdominal bruits or hernia , no rebound or guarding.   Extremities: No lower extremity edema. No clubbing or deformities. Neuro: Alert and oriented x 3.  Grossly intact. Skin: Warm and dry, no jaundice.   Psych: Alert and cooperative, normal mood and affect.  Labs:    Imaging Studies: Ir Transcatheter Bx  01/07/2015   CLINICAL DATA:  64 year old male with a history of portal hypertension with esophageal  and possibly developing gastric varices. Patient has a history of chronic splenic vein thrombosis likely secondary to multiple prior episodes of pancreatitis. He presents today for transjugular liver biopsy and portosystemic pressure measurements to evaluate for primary underlying liver cirrhosis to help differentiate from pure left-sided portal hypertension related to splenic vein thrombosis.  EXAM: TRANSCATHETER BIOPSY  Date: 01/07/2015  PROCEDURE: 1. Ultrasound-guided puncture right internal jugular vein 2. Catheterization of the middle hepatic vein with middle hepatic venogram 3. Free and balloon occluded hepatic venous pressure measurements 4. Transjugular liver biopsy Interventional Radiologist:  Criselda Peaches, MD  ANESTHESIA/SEDATION: Moderate (conscious) sedation was used. 2mg  Versed, 50 mcg Fentanyl were administered intravenously. The patient's vital signs were monitored continuously by radiology nursing throughout the procedure.  Sedation Time: 30 minutes  MEDICATIONS: None additional  FLUOROSCOPY TIME:  12 minutes 6 seconds  CONTRAST:  25 mL Omnipaque 300 administered intravenously  TECHNIQUE: Informed consent was obtained from the patient following explanation of the procedure, risks, benefits and alternatives. The patient understands, agrees and consents for the procedure. All questions were addressed. A time out was performed.  Maximal barrier sterile technique utilized including caps, mask, sterile gowns, sterile gloves, large sterile drape, hand hygiene, and chlorhexidine skin prep.  The right neck was interrogated with ultrasound. The internal jugular vein was found to be widely patent. An image was obtained and stored for the medical record. Local anesthesia was attained by infiltration with 1% lidocaine. The vein was then punctured with an 18 gauge single wall needle. A Bentson wire was advanced through the heart and into the inferior vena cava.  A 9 French TIPS sheath was then advanced over  the wire and positioned in the right atrium. Using a multipurpose angiography catheter and Bentson wire, a hepatic vein was selected. A hepatic venogram was performed. The anatomy of the vein appears to represent the middle hepatic vein. This was confirmed with a steep oblique image which demonstrates the vein to be projecting predominantly anteriorly.  The Bentson wire was then exchanged for a superstiff Amplatz wire and the TIPS sheath advanced over the catheter and into the middle hepatic vein. The MPA catheter was then exchanged over the wire for a 5 French Fogarty balloon occlusion catheter. A balloon occluded hepatic venogram was performed confirming successful balloon occlusion of the branch hepatic veins.  Serial free and balloon occluded hepatic venous pressure measurements were then obtained as follows:  1. Free middle hepatic venous pressure 9/6 a mean of 7 2. Balloon occluded hepatic venous pressure 5/11 mean of 13 3. Second free middle hepatic venous pressure 9/6 mean if 7 4. Balloon occluded hepatic venous pressure 5/11 mean of 13 The Fogarty balloon was then removed. Over the Amplatz wire. The biopsy introducer was advanced coaxially through the 9 French sheath and positioned in the proximal third of the middle hepatic vein. Multiple 18 gauge core biopsies were then coaxially  obtained. A total of 3 passes were obtained yielding 3 good quality biopsy specimens. These were placed in formalin and delivered to pathology for further analysis.  The biopsy device and sheath were removed and hemostasis attained by manual pressure.  COMPLICATIONS: None  IMPRESSION: 1. Transjugular free and balloon occluded hepatic venous pressure measurements demonstrates a hepatic venous pressure gradient of 6 which is at the upper limits of normal. 2. Successful transjugular liver biopsy.  Signed,  Criselda Peaches, MD  [facility]  [facility]   Electronically Signed   By:  Jacqulynn Cadet M.D.   On: 01/07/2015 11:42    Assessment and Plan:   KYLAN LIBERATI is a 64 y.o. y/o male who comes here after liver biopsy with a history of portal vein thrombosis who now has had a liver biopsy that does not show any cirrhosis with venous gradient pressures of 6. The patient has been told of his findings. The patient has been told to try to lose weight because of his fatty liver found on the biopsy. The patient will also be sent for a referral to hematology for possible coagulopathy causing him a hypercoagulable state. The patient has been explained the plan and agrees with it.

## 2015-01-18 ENCOUNTER — Telehealth: Payer: Self-pay

## 2015-01-18 NOTE — Telephone Encounter (Signed)
Dr. Jacqualyn Posey called from Interventional Radiology today to let Vickey Huger, NP know about the results of pathology sent on Transjugular Liver Biopsy on 01/07/15.  Can you please review and let me know of anything further needed for this patient.

## 2015-01-19 NOTE — Telephone Encounter (Signed)
Saw patient 2 days ago and reviewed the path with him already.

## 2015-01-31 ENCOUNTER — Inpatient Hospital Stay: Payer: 59 | Attending: Oncology | Admitting: Oncology

## 2015-01-31 ENCOUNTER — Ambulatory Visit: Payer: 59

## 2015-01-31 ENCOUNTER — Other Ambulatory Visit: Payer: Self-pay | Admitting: *Deleted

## 2015-01-31 ENCOUNTER — Encounter: Payer: Self-pay | Admitting: Oncology

## 2015-01-31 VITALS — BP 174/111 | HR 93 | Temp 97.7°F | Resp 18 | Ht 66.0 in | Wt 170.6 lb

## 2015-01-31 DIAGNOSIS — I10 Essential (primary) hypertension: Secondary | ICD-10-CM | POA: Diagnosis not present

## 2015-01-31 DIAGNOSIS — F1020 Alcohol dependence, uncomplicated: Secondary | ICD-10-CM | POA: Diagnosis not present

## 2015-01-31 DIAGNOSIS — I81 Portal vein thrombosis: Secondary | ICD-10-CM | POA: Diagnosis not present

## 2015-01-31 DIAGNOSIS — I8500 Esophageal varices without bleeding: Secondary | ICD-10-CM

## 2015-01-31 DIAGNOSIS — Z79899 Other long term (current) drug therapy: Secondary | ICD-10-CM | POA: Diagnosis not present

## 2015-01-31 DIAGNOSIS — E119 Type 2 diabetes mellitus without complications: Secondary | ICD-10-CM | POA: Diagnosis not present

## 2015-01-31 DIAGNOSIS — K859 Acute pancreatitis, unspecified: Secondary | ICD-10-CM | POA: Diagnosis not present

## 2015-01-31 DIAGNOSIS — R748 Abnormal levels of other serum enzymes: Secondary | ICD-10-CM

## 2015-01-31 DIAGNOSIS — K7030 Alcoholic cirrhosis of liver without ascites: Secondary | ICD-10-CM

## 2015-01-31 DIAGNOSIS — Z794 Long term (current) use of insulin: Secondary | ICD-10-CM | POA: Diagnosis not present

## 2015-01-31 LAB — ANTITHROMBIN III: AntiThromb III Func: 82 % (ref 75–120)

## 2015-02-01 ENCOUNTER — Telehealth: Payer: Self-pay | Admitting: *Deleted

## 2015-02-01 ENCOUNTER — Other Ambulatory Visit: Payer: Self-pay | Admitting: Oncology

## 2015-02-01 ENCOUNTER — Other Ambulatory Visit: Payer: 59

## 2015-02-01 DIAGNOSIS — I82890 Acute embolism and thrombosis of other specified veins: Secondary | ICD-10-CM

## 2015-02-01 MED ORDER — APIXABAN 5 MG PO TABS: 5.0000 mg | ORAL_TABLET | Freq: Two times a day (BID) | ORAL | Status: DC

## 2015-02-01 NOTE — Telephone Encounter (Signed)
rx faxed to [facility] in [place], patients family notified

## 2015-02-01 NOTE — Telephone Encounter (Signed)
Pt is very upset. States that his blood thinner was never called in to his pharmacy.Marland KitchenMarland Kitchen

## 2015-02-02 LAB — LUPUS ANTICOAGULANT PANEL
DRVVT: 27.4 s (ref 0.0–55.1)
PTT Lupus Anticoagulant: 36.2 s (ref 0.0–50.0)

## 2015-02-02 LAB — PROTEIN C ACTIVITY: Protein C Activity: 81 % (ref 74–151)

## 2015-02-02 LAB — PROTEIN S, TOTAL: Protein S Ag, Total: 112 % (ref 58–150)

## 2015-02-02 LAB — PROTEIN C, TOTAL: Protein C, Total: 59 % — ABNORMAL LOW (ref 70–140)

## 2015-02-02 LAB — PROTEIN S ACTIVITY: Protein S Activity: 114 % (ref 60–145)

## 2015-02-03 ENCOUNTER — Inpatient Hospital Stay: Payer: 59

## 2015-02-03 DIAGNOSIS — I81 Portal vein thrombosis: Secondary | ICD-10-CM

## 2015-02-03 LAB — BETA-2-GLYCOPROTEIN I ABS, IGG/M/A
BETA-2-GLYCOPROTEIN I IGM: 16 GPI IgM units (ref 0–32)
Beta-2-Glycoprotein I IgA: <9 GPI IgA units (ref 0–25)

## 2015-02-04 ENCOUNTER — Other Ambulatory Visit: Payer: Self-pay | Admitting: *Deleted

## 2015-02-07 ENCOUNTER — Telehealth: Payer: Self-pay | Admitting: *Deleted

## 2015-02-07 ENCOUNTER — Inpatient Hospital Stay: Payer: 59 | Attending: Oncology

## 2015-02-07 ENCOUNTER — Other Ambulatory Visit: Payer: Self-pay | Admitting: *Deleted

## 2015-02-07 DIAGNOSIS — I81 Portal vein thrombosis: Secondary | ICD-10-CM | POA: Diagnosis present

## 2015-02-07 MED ORDER — RIVAROXABAN 20 MG PO TABS: 20.0000 mg | ORAL_TABLET | Freq: Every day | ORAL | Status: DC

## 2015-02-07 MED ORDER — RIVAROXABAN 15 MG PO TABS: 15.0000 mg | ORAL_TABLET | Freq: Two times a day (BID) | ORAL | Status: DC

## 2015-02-07 NOTE — Telephone Encounter (Signed)
I checked with Tillie Rung who stated a new rx for Xarelto was e scribed this morning,  I explained this to the patient who then said he was not sure he wanted to even be on blood thinners, he forgot to tell Dr Grayland Ormond that he had a blood vessel in his neck spontaneously last year and was bruised from neck down into chest and was told at that time that his body was acting like he was on blood thinners.He is asking to speak with Dr Grayland Ormond again before deciding to take any.   I also received a call from Bobbye Charleston in lab that his blood was hemolyzed and the Homocysteine was cancelled. Patienrt agrees to come in for redraw this morning and will speak with nurse at that time

## 2015-02-07 NOTE — Progress Notes (Signed)
[place]  [phone] [phone]  ID: [name] OB: 01/31/1951  MR#: [id number]  WCB#:[id number]  Patient Care Team: Birdie Sons, MD as PCP - General (Family Medicine)  CHIEF COMPLAINT:  Chief Complaint  Patient presents with  . New Evaluation    portal vein thrombosis    INTERVAL HISTORY: Patient is a 64 year old male who was recently found to have a portal vein thrombosis. No distinct etiology was determined. He is referred to clinic for further evaluation and consideration of anticoagulation. He currently feels well and is asymptomatic. He is no neurologic complaints. He denies any recent fevers or illnesses. He denies any pain. He has no chest pain or shortness of breath. He denies any nausea, vomiting, constipation, or diarrhea. He has no melanoma or hematochezia. He has no urinary complaints. Patient feels at his baseline and offers no specific complaints today.  REVIEW OF SYSTEMS:   Review of Systems  Constitutional: Negative.   Respiratory: Negative.   Cardiovascular: Negative.   Gastrointestinal: Negative.   Musculoskeletal: Negative.   Endo/Heme/Allergies: Does not bruise/bleed easily.    As per HPI. Otherwise, a complete review of systems is negatve.  PAST MEDICAL HISTORY: Past Medical History  Diagnosis Date  . Hypertension   . Pancreatitis   . Diabetes mellitus without complication   . Elevated liver enzymes   . Cirrhosis with alcoholism   . Chronic pancreatitis   . Alcoholism   . Esophageal varices     PAST SURGICAL HISTORY: Past Surgical History  Procedure Laterality Date  . Hernia repair      x2  . Cholecystectomy    . Tonsillectomy      FAMILY HISTORY Family History  Problem Relation Age of Onset  . Colon polyps Father        ADVANCED DIRECTIVES:    HEALTH MAINTENANCE: History  Substance Use Topics  . Smoking status: Never Smoker   . Smokeless tobacco: Never Used  . Alcohol Use: No      Colonoscopy:  PAP:  Bone density:  Lipid panel:  No Known Allergies  Current Outpatient Prescriptions  Medication Sig Dispense Refill  . allopurinol (ZYLOPRIM) 100 MG tablet Take 100 mg by mouth daily.    . Calcium Citrate 200 MG TABS Take 200 tablets by mouth daily.    . cyclobenzaprine (FLEXERIL) 10 MG tablet Take 10 mg by mouth 2 (two) times daily.    . ferrous fumarate-iron polysaccharide complex (TANDEM) 162-115.2 MG CAPS Take 1 capsule by mouth daily with breakfast.    . insulin aspart (NOVOLOG) 100 UNIT/ML injection Inject 8 Units into the skin 3 (three) times daily before meals. Novolog (100 units/ml), 5-8 units subcutaneous tid wc, as needed per Sliding Scale.    . insulin glargine (LANTUS) 100 UNIT/ML injection Inject 20 Units into the skin at bedtime.    . lipase/protease/amylase (CREON) 12000 UNITS CPEP capsule Take 24,000 Units by mouth 3 (three) times daily with meals.    Marland Kitchen lisinopril (PRINIVIL,ZESTRIL) 10 MG tablet Take 10 mg by mouth daily.    . magnesium gluconate (MAGONATE) 500 MG tablet Take 500 mg by mouth 2 (two) times daily.    . Multiple Vitamin (VITAMIN E/FOLIC OHYW/V-3/X-10 PO) Take 50 mg by mouth daily.    . MULTIPLE VITAMIN PO Take 1 tablet by mouth.    . nadolol (CORGARD) 20 MG tablet Take 20 mg by mouth daily.    . niacin (NIASPAN) 500 MG CR tablet Take 500 mg by  mouth at bedtime.    Marland Kitchen omeprazole (PRILOSEC) 40 MG capsule Take 40 mg by mouth 2 (two) times daily.    . Potassium Gluconate 550 MG TABS Take 1 tablet by mouth daily.    . Pyridoxine HCl (VITAMIN B-6) 500 MG tablet Take 500 mg by mouth daily.    . sertraline (ZOLOFT) 100 MG tablet Take 100 mg by mouth daily.    . sucralfate (CARAFATE) 1 G tablet Take 1 g by mouth as needed.     . zinc gluconate 50 MG tablet Take 50 mg by mouth daily.    Marland Kitchen apixaban (ELIQUIS) 5 MG TABS tablet Take 1 tablet (5 mg total) by mouth 2 (two) times daily. Take 2 tabs 2x/day for 7 days then decease to 1 tab 2x/day for a  total of 3 months. 70 tablet 3  . Rivaroxaban (XARELTO) 15 MG TABS tablet Take 1 tablet (15 mg total) by mouth 2 (two) times daily with a meal. Take 15 mg two times a day for 21 days, then switch to 20 mg once a day. 21 tablet 0  . rivaroxaban (XARELTO) 20 MG TABS tablet Take 1 tablet (20 mg total) by mouth daily with supper. 30 tablet 2   No current facility-administered medications for this visit.    OBJECTIVE: Filed Vitals:   01/31/15 1336  BP: 174/111  Pulse: 93  Temp: 97.7 F (36.5 C)  Resp: 18     Body mass index is 27.55 kg/(m^2).    ECOG FS:0 - Asymptomatic  General: Well-developed, well-nourished, no acute distress. Eyes: Pink conjunctiva, anicteric sclera. HEENT: Normocephalic, moist mucous membranes, clear oropharnyx. Lungs: Clear to auscultation bilaterally. Heart: Regular rate and rhythm. No rubs, murmurs, or gallops. Abdomen: Soft, nontender, nondistended. No organomegaly noted, normoactive bowel sounds. Musculoskeletal: No edema, cyanosis, or clubbing. Neuro: Alert, answering all questions appropriately. Cranial nerves grossly intact. Skin: No rashes or petechiae noted. Psych: Normal affect. Lymphatics: No cervical, calvicular, axillary or inguinal LAD.   LAB RESULTS:  Lab Results  Component Value Date   NA 137 12/03/2014   K 4.3 12/03/2014   CL 102 12/03/2014   CO2 25 12/03/2014   GLUCOSE 285* 12/03/2014   BUN 13 12/03/2014   CREATININE 0.64 12/03/2014   CALCIUM 8.7* 12/03/2014   PROT 7.3 11/24/2014   ALBUMIN 4.3 11/24/2014   AST 96* 11/24/2014   ALT 95* 11/24/2014   ALKPHOS 212* 11/24/2014   BILITOT 2.2* 11/24/2014   GFRNONAA >60 12/03/2014   GFRAA >60 12/03/2014    Lab Results  Component Value Date   WBC 5.5 01/07/2015   NEUTROABS 3.8 11/24/2014   HGB 13.9 01/07/2015   HCT 41.3 01/07/2015   MCV 100.5* 01/07/2015   PLT 219 01/07/2015     STUDIES: No results found.  ASSESSMENT: Portal vein thrombosis.  PLAN:    1. Portal vein  thrombosis: Patient has no obvious etiology other than a history of cirrhosis secondary to alcoholism for his portal vein thrombosis. Have initiated a full hypercoagulable workup for completeness, which is pending at time of dictation. Patient will require a minimum of 3 months of anticoagulation. Return to clinic in 3 months to discuss his laboratory results and whether or not he needs to continue anticoagulation. 2. History of ecchymosis/cirrhosis: Patient reports a history of easy bleeding and bruising that was evaluated at [facility] in approximately August 2015. They reportedly determined this was secondary to cofactor consumption from his cirrhosis. Will check a PT, PTT, and von Willebrand's panel prior  to initiating anticoagulation.  Patient expressed understanding and was in agreement with this plan. He also understands that He can call clinic at any time with any questions, concerns, or complaints.     Lloyd Huger, MD   02/07/2015 4:04 PM

## 2015-02-08 ENCOUNTER — Inpatient Hospital Stay: Payer: 59

## 2015-02-08 DIAGNOSIS — I81 Portal vein thrombosis: Secondary | ICD-10-CM | POA: Diagnosis not present

## 2015-02-08 LAB — APTT: aPTT: 26 seconds (ref 24–36)

## 2015-02-08 LAB — PROTIME-INR
INR: 1.15
PROTHROMBIN TIME: 14.9 s (ref 11.4–15.0)

## 2015-02-08 LAB — CARDIOLIPIN ANTIBODIES, IGG, IGM, IGA
ANTICARDIOLIPIN IGM: 12 [MPL'U]/mL (ref 0–12)
Anticardiolipin IgA: <9 APL U/mL (ref 0–11)
Anticardiolipin IgG: <9 GPL U/mL (ref 0–14)

## 2015-02-08 LAB — HOMOCYSTEINE: Homocysteine: 23.4 umol/L — ABNORMAL HIGH (ref 0.0–15.0)

## 2015-02-10 LAB — COAG STUDIES INTERP REPORT: PDF IMAGE: 0

## 2015-02-10 LAB — VON WILLEBRAND PANEL
Coagulation Factor VIII: 43 % — ABNORMAL LOW (ref 50–150)
Ristocetin Co-factor, Plasma: 23 % — ABNORMAL LOW (ref 50–150)
Von Willebrand Antigen, Plasma: 57 % (ref 50–150)

## 2015-02-10 LAB — PROTHROMBIN GENE MUTATION

## 2015-02-10 LAB — FACTOR 5 LEIDEN

## 2015-02-19 ENCOUNTER — Encounter: Payer: Self-pay | Admitting: Emergency Medicine

## 2015-02-19 ENCOUNTER — Emergency Department: Payer: 59

## 2015-02-19 ENCOUNTER — Other Ambulatory Visit: Payer: Self-pay

## 2015-02-19 ENCOUNTER — Emergency Department
Admission: EM | Admit: 2015-02-19 | Discharge: 2015-02-20 | Disposition: A | Discharge status: Home / Self Care | Payer: 59 | Attending: Emergency Medicine | Admitting: Emergency Medicine

## 2015-02-19 DIAGNOSIS — S0990XA Unspecified injury of head, initial encounter: Secondary | ICD-10-CM | POA: Diagnosis present

## 2015-02-19 DIAGNOSIS — S0101XA Laceration without foreign body of scalp, initial encounter: Secondary | ICD-10-CM | POA: Diagnosis not present

## 2015-02-19 DIAGNOSIS — Z79899 Other long term (current) drug therapy: Secondary | ICD-10-CM | POA: Diagnosis not present

## 2015-02-19 DIAGNOSIS — Z7901 Long term (current) use of anticoagulants: Secondary | ICD-10-CM | POA: Insufficient documentation

## 2015-02-19 DIAGNOSIS — E119 Type 2 diabetes mellitus without complications: Secondary | ICD-10-CM | POA: Insufficient documentation

## 2015-02-19 DIAGNOSIS — I10 Essential (primary) hypertension: Secondary | ICD-10-CM | POA: Insufficient documentation

## 2015-02-19 DIAGNOSIS — Y9289 Other specified places as the place of occurrence of the external cause: Secondary | ICD-10-CM | POA: Diagnosis not present

## 2015-02-19 DIAGNOSIS — W01198A Fall on same level from slipping, tripping and stumbling with subsequent striking against other object, initial encounter: Secondary | ICD-10-CM | POA: Diagnosis not present

## 2015-02-19 DIAGNOSIS — S060X0A Concussion without loss of consciousness, initial encounter: Secondary | ICD-10-CM | POA: Diagnosis not present

## 2015-02-19 DIAGNOSIS — IMO0002 Reserved for concepts with insufficient information to code with codable children: Secondary | ICD-10-CM

## 2015-02-19 DIAGNOSIS — W19XXXA Unspecified fall, initial encounter: Secondary | ICD-10-CM

## 2015-02-19 DIAGNOSIS — Y998 Other external cause status: Secondary | ICD-10-CM | POA: Diagnosis not present

## 2015-02-19 DIAGNOSIS — Y9389 Activity, other specified: Secondary | ICD-10-CM | POA: Diagnosis not present

## 2015-02-19 DIAGNOSIS — Z794 Long term (current) use of insulin: Secondary | ICD-10-CM | POA: Diagnosis not present

## 2015-02-19 LAB — GLUCOSE, CAPILLARY: Glucose-Capillary: 190 mg/dL — ABNORMAL HIGH (ref 65–99)

## 2015-02-19 MED ORDER — OXYCODONE-ACETAMINOPHEN 5-325 MG PO TABS
1.0000 | ORAL_TABLET | Freq: Once | ORAL | Status: AC
  Administered 2015-02-20: 1 via ORAL
  Filled 2015-02-19: qty 1

## 2015-02-19 MED ORDER — LIDOCAINE-EPINEPHRINE (PF) 1 %-1:200000 IJ SOLN
INTRAMUSCULAR | Status: AC
  Filled 2015-02-19: qty 30

## 2015-02-19 NOTE — ED Provider Notes (Signed)
[facility] Emergency Department Provider Note  ____________________________________________  Time seen: Approximately 10 PM  I have reviewed the triage vital signs and the nursing notes.   HISTORY  Chief Complaint Head Injury; Fall; and Head Laceration    HPI [name] is a 64 y.o. male with diabetes on Xarelto for venous thrombosis who presents today with a mechanical fall on his yard. He said that he was in the yard when he tripped over a pint's falling backward and hitting his head. He sustained a laceration to the occipitoparietal region. He is not sure if he lost consciousness. He has had a worsening headache since the event with some mild dizziness.  Denies any alcohol ingestion. Denies any focal weakness or numbness.   Past Medical History  Diagnosis Date  . Hypertension   . Pancreatitis   . Diabetes mellitus without complication   . Elevated liver enzymes   . Cirrhosis with alcoholism   . Chronic pancreatitis   . Alcoholism   . Esophageal varices     Patient Active Problem List   Diagnosis Date Noted  . Splenic vein thrombosis   . History of portal hypertension   . Blood clotting disorder 02/11/2014  . Odynophagia 02/10/2014  . Chronic pancreatitis 12/08/2013  . Abnormal liver enzymes 12/08/2013  . Esophagitis 12/08/2013  . Gastric catarrh 12/08/2013    Past Surgical History  Procedure Laterality Date  . Hernia repair      x2  . Cholecystectomy    . Tonsillectomy      Current Outpatient Rx  Name  Route  Sig  Dispense  Refill  . Rivaroxaban (XARELTO) 15 MG TABS tablet   Oral   Take 1 tablet (15 mg total) by mouth 2 (two) times daily with a meal. Take 15 mg two times a day for 21 days, then switch to 20 mg once a day.   21 tablet   0   . allopurinol (ZYLOPRIM) 100 MG tablet   Oral   Take 100 mg by mouth daily.         Marland Kitchen apixaban (ELIQUIS) 5 MG TABS tablet   Oral   Take 1 tablet (5 mg total) by mouth 2 (two) times  daily. Take 2 tabs 2x/day for 7 days then decease to 1 tab 2x/day for a total of 3 months.   70 tablet   3   . Calcium Citrate 200 MG TABS   Oral   Take 200 tablets by mouth daily.         . cyclobenzaprine (FLEXERIL) 10 MG tablet   Oral   Take 10 mg by mouth 2 (two) times daily.         . ferrous fumarate-iron polysaccharide complex (TANDEM) 162-115.2 MG CAPS   Oral   Take 1 capsule by mouth daily with breakfast.         . insulin aspart (NOVOLOG) 100 UNIT/ML injection   Subcutaneous   Inject 8 Units into the skin 3 (three) times daily before meals. Novolog (100 units/ml), 5-8 units subcutaneous tid wc, as needed per Sliding Scale.         . insulin glargine (LANTUS) 100 UNIT/ML injection   Subcutaneous   Inject 20 Units into the skin at bedtime.         . lipase/protease/amylase (CREON) 12000 UNITS CPEP capsule   Oral   Take 24,000 Units by mouth 3 (three) times daily with meals.         Marland Kitchen  lisinopril (PRINIVIL,ZESTRIL) 10 MG tablet   Oral   Take 10 mg by mouth daily.         . magnesium gluconate (MAGONATE) 500 MG tablet   Oral   Take 500 mg by mouth 2 (two) times daily.         . Multiple Vitamin (VITAMIN E/FOLIC ZOXW/R-6/E-45 PO)   Oral   Take 50 mg by mouth daily.         . MULTIPLE VITAMIN PO   Oral   Take 1 tablet by mouth.         . nadolol (CORGARD) 20 MG tablet   Oral   Take 20 mg by mouth daily.         . niacin (NIASPAN) 500 MG CR tablet   Oral   Take 500 mg by mouth at bedtime.         Marland Kitchen omeprazole (PRILOSEC) 40 MG capsule   Oral   Take 40 mg by mouth 2 (two) times daily.         . Potassium Gluconate 550 MG TABS   Oral   Take 1 tablet by mouth daily.         . Pyridoxine HCl (VITAMIN B-6) 500 MG tablet   Oral   Take 500 mg by mouth daily.         . rivaroxaban (XARELTO) 20 MG TABS tablet   Oral   Take 1 tablet (20 mg total) by mouth daily with supper.   30 tablet   2   . sertraline (ZOLOFT) 100 MG  tablet   Oral   Take 100 mg by mouth daily.         . sucralfate (CARAFATE) 1 G tablet   Oral   Take 1 g by mouth as needed.          . zinc gluconate 50 MG tablet   Oral   Take 50 mg by mouth daily.           Allergies Review of patient's allergies indicates no known allergies.  Family History  Problem Relation Age of Onset  . Colon polyps Father     Social History Social History  Substance Use Topics  . Smoking status: Never Smoker   . Smokeless tobacco: Never Used  . Alcohol Use: No    Review of Systems Constitutional: No fever/chills Eyes: No visual changes. ENT: No sore throat. Cardiovascular: Denies chest pain. Respiratory: Denies shortness of breath. Gastrointestinal: No abdominal pain.  No nausea, no vomiting.  No diarrhea.  No constipation. Genitourinary: Negative for dysuria. Musculoskeletal: Negative for back pain. Skin: Negative for rash. Neurological: Negative for numbness.  10-point ROS otherwise negative.  ____________________________________________   PHYSICAL EXAM:  VITAL SIGNS: ED Triage Vitals  Enc Vitals Group     BP 02/19/15 2146 110/80 mmHg     Pulse Rate 02/19/15 2146 56     Resp 02/19/15 2146 16     Temp 02/19/15 2146 98 F (36.7 C)     Temp Source 02/19/15 2146 Oral     SpO2 02/19/15 2146 97 %     Weight 02/19/15 2146 162 lb (73.483 kg)     Height 02/19/15 2146 5\' 6"  (1.676 m)     Head Cir --      Peak Flow --      Pain Score 02/19/15 2147 8     Pain Loc --      Pain Edu? --      Excl.  in [place]? --     Constitutional: Alert and oriented. Well appearing and in no acute distress. Eyes: Conjunctivae are normal. PERRL. EOMI. Head: Jagged laceration to the posterior parietal region in a vertical orientation. 1.5 inches. No active bleeding at this time. Nose: No congestion/rhinnorhea. Mouth/Throat: Mucous membranes are moist.  Oropharynx non-erythematous. Neck: No stridor.   Cardiovascular: Normal rate, regular rhythm.  Grossly normal heart sounds.  Good peripheral circulation. Respiratory: Normal respiratory effort.  No retractions. Lungs CTAB. Gastrointestinal: Soft and nontender. No distention. No abdominal bruits. No CVA tenderness. Musculoskeletal: No lower extremity tenderness nor edema.  No joint effusions. Neurologic:  Normal speech and language. No gross focal neurologic deficits are appreciated. No gait instability. Skin:  Skin is warm, dry and intact. No rash noted. Psychiatric: Mood and affect are normal. Speech and behavior are normal.  ____________________________________________   LABS (all labs ordered are listed, but only abnormal results are displayed)  Labs Reviewed  GLUCOSE, CAPILLARY - Abnormal; Notable for the following:    Glucose-Capillary 190 (*)    All other components within normal limits  CBG MONITORING, ED   ____________________________________________  EKG  ED ECG REPORT I, Laikyn Gewirtz,  Youlanda Roys, the attending physician, personally viewed and interpreted this ECG.   Date: 02/19/2015  EKG Time: 2140  Rate: 65  Rhythm: normal sinus rhythm  Axis: Normal axis  Intervals:none  ST&T Change: No ST elevations or depressions. No abnormal T-wave inversions.  ____________________________________________  RADIOLOGY  No acute injury on the CAT scan of the brain. ____________________________________________   PROCEDURES  LACERATION REPAIR Performed by: Doran Stabler Authorized by: Doran Stabler Consent: Verbal consent obtained. Risks and benefits: risks, benefits and alternatives were discussed Consent given by: patient Patient identity confirmed: provided demographic data Irrigated thoroughly with copious amount of saline Wound explored to base and bloodless field and does not violate the g[place].  Laceration Location: Posterior parietal  Laceration Length: 5 cm  No Foreign Bodies seen or palpated  Anesthesia: local infiltration  Local anesthetic:  lidocaine 1 % with epinephrine  Anesthetic total: 4 ml  Irrigation method: syringe Amount of cleaning: standard  Skin closure: Staples 2   Number of sutures: 0   Technique: Staples 2. Covered with Surgicel.  Then wrapped with pressure dressing. No oozing through the dressings.   Patient tolerance: Patient tolerated the procedure well with no immediate complications.   ____________________________________________   INITIAL IMPRESSION / ASSESSMENT AND PLAN / ED COURSE  Pertinent labs & imaging results that were available during my care of the patient were reviewed by me and considered in my medical decision making (see chart for details).  ----------------------------------------- 11:51 PM on 02/19/2015 -----------------------------------------  Patient still with headache. We'll give Percocet 1. An-related without any assistance with normal gait in the emergency department. Possibly with mild concussion. No dizziness at this time. We'll discharge to home. Patient's last tetanus shot within the last 4 years. Counseled patient as well as wife to follow up for staple removal in 7 days. Also counseled about applying pressure if it starts oozing again at home. Counseled to return immediately to the emergency department if excessive bleeding or no resolution of oozing after 5-10 minutes of pressure. ____________________________________________   FINAL CLINICAL IMPRESSION(S) / ED DIAGNOSES  Acute mechanical fall with laceration of the scalp. Initial visit.    Orbie Pyo, MD 02/19/15 [phone]

## 2015-02-19 NOTE — Discharge Instructions (Signed)
Concussion  A concussion, or closed-head injury, is a brain injury caused by a direct blow to the head or by a quick and sudden movement (jolt) of the head or neck. Concussions are usually not life-threatening. Even so, the effects of a concussion can be serious. If you have had a concussion before, you are more likely to experience concussion-like symptoms after a direct blow to the head.   CAUSES  · Direct blow to the head, such as from running into another player during a soccer game, being hit in a fight, or hitting your head on a hard surface.  · A jolt of the head or neck that causes the brain to move back and forth inside the skull, such as in a car crash.  SIGNS AND SYMPTOMS  The signs of a concussion can be hard to notice. Early on, they may be missed by you, family members, and health care providers. You may look fine but act or feel differently.  Symptoms are usually temporary, but they may last for days, weeks, or even longer. Some symptoms may appear right away while others may not show up for hours or days. Every head injury is different. Symptoms include:  · Mild to moderate headaches that will not go away.  · A feeling of pressure inside your head.  · Having more trouble than usual:  ¨ Learning or remembering things you have heard.  ¨ Answering questions.  ¨ Paying attention or concentrating.  ¨ Organizing daily tasks.  ¨ Making decisions and solving problems.  · Slowness in thinking, acting or reacting, speaking, or reading.  · Getting lost or being easily confused.  · Feeling tired all the time or lacking energy (fatigued).  · Feeling drowsy.  · Sleep disturbances.  ¨ Sleeping more than usual.  ¨ Sleeping less than usual.  ¨ Trouble falling asleep.  ¨ Trouble sleeping (insomnia).  · Loss of balance or feeling lightheaded or dizzy.  · Nausea or vomiting.  · Numbness or tingling.  · Increased sensitivity to:  ¨ Sounds.  ¨ Lights.  ¨ Distractions.  · Vision problems or eyes that tire  easily.  · Diminished sense of taste or smell.  · Ringing in the ears.  · Mood changes such as feeling sad or anxious.  · Becoming easily irritated or angry for little or no reason.  · Lack of motivation.  · Seeing or hearing things other people do not see or hear (hallucinations).  DIAGNOSIS  Your health care provider can usually diagnose a concussion based on a description of your injury and symptoms. He or she will ask whether you passed out (lost consciousness) and whether you are having trouble remembering events that happened right before and during your injury.  Your evaluation might include:  · A brain scan to look for signs of injury to the brain. Even if the test shows no injury, you may still have a concussion.  · Blood tests to be sure other problems are not present.  TREATMENT  · Concussions are usually treated in an emergency department, in urgent care, or at a clinic. You may need to stay in the hospital overnight for further treatment.  · Tell your health care provider if you are taking any medicines, including prescription medicines, over-the-counter medicines, and natural remedies. Some medicines, such as blood thinners (anticoagulants) and aspirin, may increase the chance of complications. Also tell your health care provider whether you have had alcohol or are taking illegal drugs. This information   may affect treatment.  · Your health care provider will send you home with important instructions to follow.  · How fast you will recover from a concussion depends on many factors. These factors include how severe your concussion is, what part of your brain was injured, your age, and how healthy you were before the concussion.  · Most people with mild injuries recover fully. Recovery can take time. In general, recovery is slower in older persons. Also, persons who have had a concussion in the past or have other medical problems may find that it takes longer to recover from their current injury.  HOME  CARE INSTRUCTIONS  General Instructions  · Carefully follow the directions your health care provider gave you.  · Only take over-the-counter or prescription medicines for pain, discomfort, or fever as directed by your health care provider.  · Take only those medicines that your health care provider has approved.  · Do not drink alcohol until your health care provider says you are well enough to do so. Alcohol and certain other drugs may slow your recovery and can put you at risk of further injury.  · If it is harder than usual to remember things, write them down.  · If you are easily distracted, try to do one thing at a time. For example, do not try to watch TV while fixing dinner.  · Talk with family members or close friends when making important decisions.  · Keep all follow-up appointments. Repeated evaluation of your symptoms is recommended for your recovery.  · Watch your symptoms and tell others to do the same. Complications sometimes occur after a concussion. Older adults with a brain injury may have a higher risk of serious complications, such as a blood clot on the brain.  · Tell your teachers, school nurse, school counselor, coach, athletic trainer, or work manager about your injury, symptoms, and restrictions. Tell them about what you can or cannot do. They should watch for:  ¨ Increased problems with attention or concentration.  ¨ Increased difficulty remembering or learning new information.  ¨ Increased time needed to complete tasks or assignments.  ¨ Increased irritability or decreased ability to cope with stress.  ¨ Increased symptoms.  · Rest. Rest helps the brain to heal. Make sure you:  ¨ Get plenty of sleep at night. Avoid staying up late at night.  ¨ Keep the same bedtime hours on weekends and weekdays.  ¨ Rest during the day. Take daytime naps or rest breaks when you feel tired.  · Limit activities that require a lot of thought or concentration. These include:  ¨ Doing homework or job-related  work.  ¨ Watching TV.  ¨ Working on the computer.  · Avoid any situation where there is potential for another head injury (football, hockey, soccer, basketball, martial arts, downhill snow sports and horseback riding). Your condition will get worse every time you experience a concussion. You should avoid these activities until you are evaluated by the appropriate follow-up health care providers.  Returning To Your Regular Activities  You will need to return to your normal activities slowly, not all at once. You must give your body and brain enough time for recovery.  · Do not return to sports or other athletic activities until your health care provider tells you it is safe to do so.  · Ask your health care provider when you can drive, ride a bicycle, or operate heavy machinery. Your ability to react may be slower after a   brain injury. Never do these activities if you are dizzy.  · Ask your health care provider about when you can return to work or school.  Preventing Another Concussion  It is very important to avoid another brain injury, especially before you have recovered. In rare cases, another injury can lead to permanent brain damage, brain swelling, or death. The risk of this is greatest during the first 7-10 days after a head injury. Avoid injuries by:  · Wearing a seat belt when riding in a car.  · Drinking alcohol only in moderation.  · Wearing a helmet when biking, skiing, skateboarding, skating, or doing similar activities.  · Avoiding activities that could lead to a second concussion, such as contact or recreational sports, until your health care provider says it is okay.  · Taking safety measures in your home.  ¨ Remove clutter and tripping hazards from floors and stairways.  ¨ Use grab bars in bathrooms and handrails by stairs.  ¨ Place non-slip mats on floors and in bathtubs.  ¨ Improve lighting in dim areas.  SEEK MEDICAL CARE IF:  · You have increased problems paying attention or  concentrating.  · You have increased difficulty remembering or learning new information.  · You need more time to complete tasks or assignments than before.  · You have increased irritability or decreased ability to cope with stress.  · You have more symptoms than before.  Seek medical care if you have any of the following symptoms for more than 2 weeks after your injury:  · Lasting (chronic) headaches.  · Dizziness or balance problems.  · Nausea.  · Vision problems.  · Increased sensitivity to noise or light.  · Depression or mood swings.  · Anxiety or irritability.  · Memory problems.  · Difficulty concentrating or paying attention.  · Sleep problems.  · Feeling tired all the time.  SEEK IMMEDIATE MEDICAL CARE IF:  · You have severe or worsening headaches. These may be a sign of a blood clot in the brain.  · You have weakness (even if only in one hand, leg, or part of the face).  · You have numbness.  · You have decreased coordination.  · You vomit repeatedly.  · You have increased sleepiness.  · One pupil is larger than the other.  · You have convulsions.  · You have slurred speech.  · You have increased confusion. This may be a sign of a blood clot in the brain.  · You have increased restlessness, agitation, or irritability.  · You are unable to recognize people or places.  · You have neck pain.  · It is difficult to wake you up.  · You have unusual behavior changes.  · You lose consciousness.  MAKE SURE YOU:  · Understand these instructions.  · Will watch your condition.  · Will get help right away if you are not doing well or get worse.  Document Released: 09/15/2003 Document Revised: 06/30/2013 Document Reviewed: 01/15/2013  [employer]® Patient Information ©2015 [employer]. This information is not intended to replace advice given to you by your health care provider. Make sure you discuss any questions you have with your health care provider.

## 2015-02-19 NOTE — ED Notes (Signed)
Patient reports tripped over some vines and fell backwards approx 32ft on to concrete.  Patient unsure if he had loss of consciousness. Patient takes xarelto.  Laceration noted to scalp.

## 2015-02-19 NOTE — ED Notes (Addendum)
Pt reports mechanical fall, hit back of head on concrete.  Pt on xarelto for blood clot in vein from spleen to liver.  Laceration to back of head.  Pt reports feeling dizzy.  Unsure LOC.  1.5 inch laceration noted, with surrounded area abraision.  Pt nad at this time.   Wound cleaned with sterile saline and gauze and wrapped with pressure.

## 2015-02-19 NOTE — ED Notes (Signed)
MD at bedside. 

## 2015-02-20 MED ORDER — TRAMADOL HCL 50 MG PO TABS: 50.0000 mg | ORAL_TABLET | Freq: Four times a day (QID) | ORAL | Status: AC | PRN

## 2015-02-21 ENCOUNTER — Telehealth: Payer: Self-pay | Admitting: Family Medicine

## 2015-02-21 NOTE — Telephone Encounter (Signed)
Pt a call back on his cell [phone]

## 2015-02-21 NOTE — Telephone Encounter (Signed)
Pt was discharged from [facility] ER on 02/20/2015 from a fall in his yard.  Pt is scheduled for a hospital follow up on 02/24/2015.  Pt states he is having neck pain and pain on the left side.  Pt is asking if Dr Caryn Section will order x-rays for his neck and side before his appt on Thursday.  RF#[phone]

## 2015-02-21 NOTE — Telephone Encounter (Signed)
Please advise 

## 2015-02-24 ENCOUNTER — Inpatient Hospital Stay: Payer: 59 | Admitting: Family Medicine

## 2015-02-28 ENCOUNTER — Ambulatory Visit (INDEPENDENT_AMBULATORY_CARE_PROVIDER_SITE_OTHER): Payer: 59 | Admitting: Family Medicine

## 2015-02-28 ENCOUNTER — Encounter: Payer: Self-pay | Admitting: Family Medicine

## 2015-02-28 VITALS — BP 120/84 | HR 66 | Temp 98.7°F | Resp 16 | Wt 171.0 lb

## 2015-02-28 DIAGNOSIS — S0101XS Laceration without foreign body of scalp, sequela: Secondary | ICD-10-CM | POA: Diagnosis not present

## 2015-02-28 DIAGNOSIS — S20219A Contusion of unspecified front wall of thorax, initial encounter: Secondary | ICD-10-CM | POA: Insufficient documentation

## 2015-02-28 DIAGNOSIS — S0101XA Laceration without foreign body of scalp, initial encounter: Secondary | ICD-10-CM | POA: Insufficient documentation

## 2015-02-28 DIAGNOSIS — S20212S Contusion of left front wall of thorax, sequela: Secondary | ICD-10-CM

## 2015-02-28 NOTE — Patient Instructions (Signed)
May resume normal daily activities.

## 2015-02-28 NOTE — Progress Notes (Signed)
Patient: [name] Male    DOB: October 01, 1950   64 y.o.   MRN: [id number] Visit Date: 02/28/2015  Today's Provider: Lelon Huh, MD   Chief Complaint  Patient presents with  . Hospitalization Follow-up  . Fall   Subjective:    Fall The accident occurred more than 1 week ago. The fall occurred while walking. He fell from a height of 3 to 5 ft. He landed on concrete. The point of impact was the head (Back). The pain is present in the back and head. The pain is at a severity of 10/10 (Back). The pain is severe (Back). The symptoms are aggravated by rotation and movement. Associated symptoms include headaches and a visual change. Pertinent negatives include no abdominal pain, hearing loss, nausea, numbness or tingling. He has tried nothing for the symptoms. The treatment provided no relief.     Follow up ER  Patient was seen in ER on 02/19/2015 and discharged on same day. He was treated for fall in yard. Patient sustained concussion hitting his head. Treatment for this included 2 staples of his scalp.   He reports fair compliance with treatment. He reports this condition is Improved.  He went to [facility] the 15[facility] due to worsening left rib pain, where Xrays were done which were normal. Is still very painful, but improving a bit.   He has no dizziness, headaches, or any other neurologic symptoms.  -------------------------------------------------------------------------------  Patient was seen in Urgent care 02/21/2015 for back pain. X-ray was done on patient's back. Diagnosis was left rib bruising.    Allergies  Allergen Reactions  . Hydrochlorothiazide     Hyponatremia   Previous Medications   ALLOPURINOL (ZYLOPRIM) 100 MG TABLET    Take 100 mg by mouth daily.   APIXABAN (ELIQUIS) 5 MG TABS TABLET    Take 1 tablet (5 mg total) by mouth 2 (two) times daily. Take 2 tabs 2x/day for 7 days then decease to 1 tab 2x/day for a total of 3 months.   CALCIUM CITRATE 200 MG TABS     Take 200 tablets by mouth daily.   CYCLOBENZAPRINE (FLEXERIL) 10 MG TABLET    Take 10 mg by mouth 2 (two) times daily.   FERROUS FUMARATE-IRON POLYSACCHARIDE COMPLEX (TANDEM) 162-115.2 MG CAPS    Take 1 capsule by mouth daily with breakfast.   INSULIN ASPART (NOVOLOG) 100 UNIT/ML INJECTION    Inject 8 Units into the skin 3 (three) times daily before meals. Novolog (100 units/ml), 5-8 units subcutaneous tid wc, as needed per Sliding Scale.   INSULIN GLARGINE (LANTUS) 100 UNIT/ML INJECTION    Inject 20 Units into the skin at bedtime.   LIPASE/PROTEASE/AMYLASE (CREON) 12000 UNITS CPEP CAPSULE    Take 24,000 Units by mouth 3 (three) times daily with meals.   LISINOPRIL (PRINIVIL,ZESTRIL) 10 MG TABLET    Take 10 mg by mouth daily.   MAGNESIUM GLUCONATE (MAGONATE) 500 MG TABLET    Take 500 mg by mouth 2 (two) times daily.   MULTIPLE VITAMIN (VITAMIN E/FOLIC CWUG/Q-9/V-69 PO)    Take 50 mg by mouth daily.   MULTIPLE VITAMIN PO    Take 1 tablet by mouth.   NADOLOL (CORGARD) 20 MG TABLET    Take 20 mg by mouth daily.   NIACIN (NIASPAN) 500 MG CR TABLET    Take 500 mg by mouth at bedtime.   OMEPRAZOLE (PRILOSEC) 40 MG CAPSULE    Take 40 mg by mouth 2 (  two) times daily.   POTASSIUM GLUCONATE 550 MG TABS    Take 1 tablet by mouth daily.   PYRIDOXINE HCL (VITAMIN B-6) 500 MG TABLET    Take 500 mg by mouth daily.   RIVAROXABAN (XARELTO) 15 MG TABS TABLET    Take 1 tablet (15 mg total) by mouth 2 (two) times daily with a meal. Take 15 mg two times a day for 21 days, then switch to 20 mg once a day.   RIVAROXABAN (XARELTO) 20 MG TABS TABLET    Take 1 tablet (20 mg total) by mouth daily with supper.   SERTRALINE (ZOLOFT) 100 MG TABLET    Take 100 mg by mouth daily.   SUCRALFATE (CARAFATE) 1 G TABLET    Take 1 g by mouth as needed.    TRAMADOL (ULTRAM) 50 MG TABLET    Take 1 tablet (50 mg total) by mouth every 6 (six) hours as needed.   ZINC GLUCONATE 50 MG TABLET    Take 50 mg by mouth daily.    Review of  Systems  Respiratory: Negative for shortness of breath and wheezing.   Cardiovascular: Negative for chest pain and palpitations.  Gastrointestinal: Negative for nausea and abdominal pain.  Neurological: Positive for headaches. Negative for dizziness, tingling, light-headedness and numbness.    Social History  Substance Use Topics  . Smoking status: Never Smoker   . Smokeless tobacco: Never Used  . Alcohol Use: No   Objective:   BP 120/84 mmHg  Pulse 66  Temp(Src) 98.7 F (37.1 C) (Oral)  Resp 16  Wt 171 lb (77.565 kg)  SpO2 94%  Physical Exam  Well healed scabbed laceration top of scalp with 2 staples.  Mild contusion mid back and left side.    Assessment & Plan:     1. Scalp laceration, sequela Healing well, removed 2 sutures.   2. Rib contusion, left, sequela Slowly improving. Had negative Xrays 2 days after fall. Expect continued improvement and resolution of pain in 2-3 weeks.   Call otherwise.       Lelon Huh, MD  [facility]

## 2015-02-28 NOTE — Telephone Encounter (Signed)
Patient has appt 02/28/2015.

## 2015-03-04 ENCOUNTER — Emergency Department: Payer: 59

## 2015-03-04 ENCOUNTER — Emergency Department
Admission: EM | Admit: 2015-03-04 | Discharge: 2015-03-04 | Disposition: A | Discharge status: Home / Self Care | Payer: 59 | Attending: Emergency Medicine | Admitting: Emergency Medicine

## 2015-03-04 ENCOUNTER — Other Ambulatory Visit: Payer: Self-pay

## 2015-03-04 DIAGNOSIS — S80212A Abrasion, left knee, initial encounter: Secondary | ICD-10-CM | POA: Diagnosis not present

## 2015-03-04 DIAGNOSIS — Z794 Long term (current) use of insulin: Secondary | ICD-10-CM | POA: Insufficient documentation

## 2015-03-04 DIAGNOSIS — Y9289 Other specified places as the place of occurrence of the external cause: Secondary | ICD-10-CM | POA: Insufficient documentation

## 2015-03-04 DIAGNOSIS — F10129 Alcohol abuse with intoxication, unspecified: Secondary | ICD-10-CM | POA: Diagnosis not present

## 2015-03-04 DIAGNOSIS — W1839XA Other fall on same level, initial encounter: Secondary | ICD-10-CM | POA: Diagnosis not present

## 2015-03-04 DIAGNOSIS — S01511A Laceration without foreign body of lip, initial encounter: Secondary | ICD-10-CM | POA: Insufficient documentation

## 2015-03-04 DIAGNOSIS — S60511A Abrasion of right hand, initial encounter: Secondary | ICD-10-CM | POA: Diagnosis not present

## 2015-03-04 DIAGNOSIS — Y9389 Activity, other specified: Secondary | ICD-10-CM | POA: Insufficient documentation

## 2015-03-04 DIAGNOSIS — Z79899 Other long term (current) drug therapy: Secondary | ICD-10-CM | POA: Diagnosis not present

## 2015-03-04 DIAGNOSIS — S299XXA Unspecified injury of thorax, initial encounter: Secondary | ICD-10-CM | POA: Insufficient documentation

## 2015-03-04 DIAGNOSIS — E119 Type 2 diabetes mellitus without complications: Secondary | ICD-10-CM | POA: Diagnosis not present

## 2015-03-04 DIAGNOSIS — I10 Essential (primary) hypertension: Secondary | ICD-10-CM | POA: Insufficient documentation

## 2015-03-04 DIAGNOSIS — S025XXA Fracture of tooth (traumatic), initial encounter for closed fracture: Secondary | ICD-10-CM | POA: Diagnosis not present

## 2015-03-04 DIAGNOSIS — Z7901 Long term (current) use of anticoagulants: Secondary | ICD-10-CM | POA: Insufficient documentation

## 2015-03-04 DIAGNOSIS — T07XXXA Unspecified multiple injuries, initial encounter: Secondary | ICD-10-CM

## 2015-03-04 DIAGNOSIS — S60512A Abrasion of left hand, initial encounter: Secondary | ICD-10-CM | POA: Insufficient documentation

## 2015-03-04 DIAGNOSIS — S0993XA Unspecified injury of face, initial encounter: Secondary | ICD-10-CM | POA: Diagnosis present

## 2015-03-04 DIAGNOSIS — Y998 Other external cause status: Secondary | ICD-10-CM | POA: Diagnosis not present

## 2015-03-04 DIAGNOSIS — W19XXXA Unspecified fall, initial encounter: Secondary | ICD-10-CM

## 2015-03-04 DIAGNOSIS — M25569 Pain in unspecified knee: Secondary | ICD-10-CM

## 2015-03-04 LAB — BASIC METABOLIC PANEL
Anion gap: 12 (ref 5–15)
BUN: 12 mg/dL (ref 6–20)
CHLORIDE: 101 mmol/L (ref 101–111)
CO2: 26 mmol/L (ref 22–32)
CREATININE: 0.79 mg/dL (ref 0.61–1.24)
Calcium: 9.2 mg/dL (ref 8.9–10.3)
GFR calc Af Amer: >60 mL/min (ref >60)
GFR calc non Af Amer: >60 mL/min (ref >60)
Glucose, Bld: 223 mg/dL — ABNORMAL HIGH (ref 65–99)
POTASSIUM: 4 mmol/L (ref 3.5–5.1)
SODIUM: 139 mmol/L (ref 135–145)

## 2015-03-04 LAB — CBC
HEMATOCRIT: 43.4 % (ref 40.0–52.0)
Hemoglobin: 14.8 g/dL (ref 13.0–18.0)
MCH: 33.3 pg (ref 26.0–34.0)
MCHC: 34 g/dL (ref 32.0–36.0)
MCV: 97.7 fL (ref 80.0–100.0)
PLATELETS: 217 10*3/uL (ref 150–440)
RBC: 4.44 MIL/uL (ref 4.40–5.90)
RDW: 14.3 % (ref 11.5–14.5)
WBC: 5.8 10*3/uL (ref 3.8–10.6)

## 2015-03-04 LAB — URINALYSIS COMPLETE WITH MICROSCOPIC (ARMC ONLY)
BACTERIA UA: NONE SEEN
Bilirubin Urine: NEGATIVE
Glucose, UA: 50 mg/dL — AB
Hgb urine dipstick: NEGATIVE
Ketones, ur: NEGATIVE mg/dL
LEUKOCYTES UA: NEGATIVE
Nitrite: NEGATIVE
PH: 5 (ref 5.0–8.0)
PROTEIN: NEGATIVE mg/dL
RBC / HPF: NONE SEEN RBC/hpf (ref 0–5)
SQUAMOUS EPITHELIAL / LPF: NONE SEEN
Specific Gravity, Urine: 1.006 (ref 1.005–1.030)

## 2015-03-04 LAB — ETHANOL: ALCOHOL ETHYL (B): 258 mg/dL — AB (ref <5)

## 2015-03-04 LAB — GLUCOSE, CAPILLARY: GLUCOSE-CAPILLARY: 227 mg/dL — AB (ref 65–99)

## 2015-03-04 MED ORDER — OXYCODONE-ACETAMINOPHEN 5-325 MG PO TABS
1.0000 | ORAL_TABLET | Freq: Once | ORAL | Status: AC
  Administered 2015-03-04: 1 via ORAL
  Filled 2015-03-04: qty 1

## 2015-03-04 NOTE — ED Notes (Addendum)
Pt comes into the ED via POV from home with wife with c/o having a syncople episode today at 3pm..states just before the fall he was disoriented..states in route the pt thought they were coming to pick up a car.the patient c/o HA, recent concussion.the patient is currently on xeralto..pt has noted abrasion to BL hands and FA, left side of face and mulitple teeth knocked out

## 2015-03-04 NOTE — ED Notes (Signed)
AAOx3.  Skin warm and dry.  Wound care to abrasions.  D/C home

## 2015-03-04 NOTE — Discharge Instructions (Signed)

## 2015-03-04 NOTE — ED Provider Notes (Signed)
[facility] Emergency Department Provider Note  Time seen: 4:34 PM  I have reviewed the triage vital signs and the nursing notes.   HISTORY  Chief Complaint Loss of Consciousness and Head Injury    HPI [name] is a 64 y.o. male who presents the emergency department after a fall. According to the patient's wife he was acting a little confused, such as asking the same question multiple times, seemed a little off balance. The patient walked outside, and returned shortly after with abrasions, teeth knocked out stating that he fell. The patient does not know if he passed out, the wife did not witness him falling. The wife states over the past 2 months the patient has had many falls, for unknown reasons. She states he does drink alcohol and several of these falls up and alcohol-related, but he denies drinking any alcohol today. Patient does take Xarelto. Patient's only complaints are pain to his face, and bilateral hands where he has abrasions. Denies any headache, confusion, slurred speech, focal weakness or numbness.     Past Medical History  Diagnosis Date  . Hypertension   . Pancreatitis   . Diabetes mellitus without complication   . Elevated liver enzymes   . Cirrhosis with alcoholism   . Chronic pancreatitis   . Alcoholism   . Esophageal varices     Patient Active Problem List   Diagnosis Date Noted  . Scalp laceration 02/28/2015  . Rib contusion 02/28/2015  . Splenic vein thrombosis   . History of portal hypertension   . Blood clotting disorder 02/11/2014  . Odynophagia 02/10/2014  . Chronic pancreatitis 12/08/2013  . Abnormal liver enzymes 12/08/2013  . Esophagitis 12/08/2013  . Gastric catarrh 12/08/2013    Past Surgical History  Procedure Laterality Date  . Tonsillectomy    . Cholecystectomy  2006  . Hernia repair      Inguinal Hernia Repai. Left: 07/28/2013 Dr. [name]; 2nd repair done 04/30/2014    Current Outpatient Rx  Name  Route   Sig  Dispense  Refill  . allopurinol (ZYLOPRIM) 100 MG tablet   Oral   Take 100 mg by mouth daily.         Marland Kitchen apixaban (ELIQUIS) 5 MG TABS tablet   Oral   Take 1 tablet (5 mg total) by mouth 2 (two) times daily. Take 2 tabs 2x/day for 7 days then decease to 1 tab 2x/day for a total of 3 months.   70 tablet   3   . Calcium Citrate 200 MG TABS   Oral   Take 200 tablets by mouth daily.         . cyclobenzaprine (FLEXERIL) 10 MG tablet   Oral   Take 10 mg by mouth 2 (two) times daily.         . ferrous fumarate-iron polysaccharide complex (TANDEM) 162-115.2 MG CAPS   Oral   Take 1 capsule by mouth daily with breakfast.         . insulin aspart (NOVOLOG) 100 UNIT/ML injection   Subcutaneous   Inject 8 Units into the skin 3 (three) times daily before meals. Novolog (100 units/ml), 5-8 units subcutaneous tid wc, as needed per Sliding Scale.         . insulin glargine (LANTUS) 100 UNIT/ML injection   Subcutaneous   Inject 20 Units into the skin at bedtime.         . lipase/protease/amylase (CREON) 12000 UNITS CPEP capsule   Oral  Take 24,000 Units by mouth 3 (three) times daily with meals.         Marland Kitchen lisinopril (PRINIVIL,ZESTRIL) 10 MG tablet   Oral   Take 10 mg by mouth daily.         . magnesium gluconate (MAGONATE) 500 MG tablet   Oral   Take 500 mg by mouth 2 (two) times daily.         . Multiple Vitamin (VITAMIN E/FOLIC FKCL/E-7/N-17 PO)   Oral   Take 50 mg by mouth daily.         . MULTIPLE VITAMIN PO   Oral   Take 1 tablet by mouth.         . nadolol (CORGARD) 20 MG tablet   Oral   Take 20 mg by mouth daily.         . niacin (NIASPAN) 500 MG CR tablet   Oral   Take 500 mg by mouth at bedtime.         Marland Kitchen omeprazole (PRILOSEC) 40 MG capsule   Oral   Take 40 mg by mouth 2 (two) times daily.         . Potassium Gluconate 550 MG TABS   Oral   Take 1 tablet by mouth daily.         . Pyridoxine HCl (VITAMIN B-6) 500 MG tablet    Oral   Take 500 mg by mouth daily.         . Rivaroxaban (XARELTO) 15 MG TABS tablet   Oral   Take 1 tablet (15 mg total) by mouth 2 (two) times daily with a meal. Take 15 mg two times a day for 21 days, then switch to 20 mg once a day.   21 tablet   0   . rivaroxaban (XARELTO) 20 MG TABS tablet   Oral   Take 1 tablet (20 mg total) by mouth daily with supper.   30 tablet   2   . sertraline (ZOLOFT) 100 MG tablet   Oral   Take 100 mg by mouth daily.         . sucralfate (CARAFATE) 1 G tablet   Oral   Take 1 g by mouth as needed.          . traMADol (ULTRAM) 50 MG tablet   Oral   Take 1 tablet (50 mg total) by mouth every 6 (six) hours as needed. Patient not taking: Reported on 02/28/2015   12 tablet   0   . zinc gluconate 50 MG tablet   Oral   Take 50 mg by mouth daily.           Allergies Hydrochlorothiazide  Family History  Problem Relation Age of Onset  . Colon polyps Father   . Lung cancer Father   . Hypertension Father   . Hypertension Brother   . Hypertension Brother   . Diabetes Brother     type 2  . Congestive Heart Failure Brother     Social History Social History  Substance Use Topics  . Smoking status: Never Smoker   . Smokeless tobacco: Never Used  . Alcohol Use: No    Review of Systems Constitutional: Negative for fever. Patient does not believe he passed out, wife is not sure. ENT: Knocked out 2 teeth. Cardiovascular: States chest "soreness" Respiratory: Negative for shortness of breath. Gastrointestinal: Negative for abdominal pain Genitourinary: Negative for dysuria. Musculoskeletal: Negative for back pain. Negative for neck pain. Skin: Abrasions to  bilateral upper extremities and face. Neurological: Negative for headaches, focal weakness or numbness. 10-point ROS otherwise negative.  ____________________________________________   PHYSICAL EXAM:  VITAL SIGNS: ED Triage Vitals  Enc Vitals Group     BP 03/04/15 1534  119/77 mmHg     Pulse Rate 03/04/15 1534 76     Resp 03/04/15 1534 17     Temp 03/04/15 1534 99.5 F (37.5 C)     Temp Source 03/04/15 1534 Oral     SpO2 03/04/15 1534 95 %     Weight 03/04/15 1533 166 lb (75.297 kg)     Height 03/04/15 1533 5\' 6"  (1.676 m)     Head Cir --      Peak Flow --      Pain Score 03/04/15 1535 8     Pain Loc --      Pain Edu? --      Excl. in [place]? --     Constitutional: Alert and oriented. Abrasions to face and upper extremities, no acute distress, making jokes, lying in bed comfortably. Eyes: Normal exam, 2 mm PERRL bilaterally, EOMI. ENT   Head: Abrasions to face including left cheek, and upper lip. Small laceration to the inside of the upper lip.   Nose: No congestion/rhinnorhea. No septal hematoma.   Mouth/Throat: Mucous membranes are moist. 2 of his upper teeth appear to have fractured and are missing. Cardiovascular: Normal rate, regular rhythm. Respiratory: Normal respiratory effort without tachypnea nor retractions. Breath sounds are clear and equal bilaterally. No wheezes/rales/rhonchi. Gastrointestinal: Soft and nontender. No distention.  Musculoskeletal: Normal range of motion in all extremities. Abrasions to left knee, bilateral hands. Neurologic:  Normal speech and language. No gross focal neurologic deficits are appreciated. Speech is normal. Skin:  Skin is warm, dry abrasions as above. Psychiatric: Mood and affect are normal. Speech and behavior are normal.   ____________________________________________    EKG  EKG reviewed and interpreted by myself shows normal sinus rhythm at 76 bpm, narrow QRS, left axis deviation, normal intervals, nonspecific but no concerning ST changes noted.  ____________________________________________    RADIOLOGY  CT head/face/cspine:  No acute findings.   INITIAL IMPRESSION / ASSESSMENT AND PLAN / ED COURSE  Pertinent labs & imaging results that were available during my care of the patient  were reviewed by me and considered in my medical decision making (see chart for details).  Patient with possible syncopal episode versus fall. We will check labs. His CTs are within normal limits. No abrasions/laceration requires repair. The only laceration present is approximately 1 cm to the inside of the upper lip.  Patient's labs have resulted with an ethanol level of 258. Patient likely intoxicated leading to his fall. Labs are largely within normal limits otherwise. I discussed this with the patient and his wife, the wife will take him home and monitor the patient closely at home. Patient agreeable to plan.  ____________________________________________   FINAL CLINICAL IMPRESSION(S) / ED DIAGNOSES  Fall Abrasions Alcohol intoxication   Harvest Dark, MD 03/04/15 1800

## 2015-03-20 ENCOUNTER — Encounter: Payer: Self-pay | Admitting: Family Medicine

## 2015-03-20 DIAGNOSIS — S46819A Strain of other muscles, fascia and tendons at shoulder and upper arm level, unspecified arm, initial encounter: Secondary | ICD-10-CM | POA: Insufficient documentation

## 2015-03-20 DIAGNOSIS — Z8619 Personal history of other infectious and parasitic diseases: Secondary | ICD-10-CM | POA: Insufficient documentation

## 2015-03-20 DIAGNOSIS — H9191 Unspecified hearing loss, right ear: Secondary | ICD-10-CM | POA: Insufficient documentation

## 2015-03-20 DIAGNOSIS — K4090 Unilateral inguinal hernia, without obstruction or gangrene, not specified as recurrent: Secondary | ICD-10-CM

## 2015-03-20 DIAGNOSIS — L309 Dermatitis, unspecified: Secondary | ICD-10-CM | POA: Insufficient documentation

## 2015-03-20 DIAGNOSIS — K219 Gastro-esophageal reflux disease without esophagitis: Secondary | ICD-10-CM | POA: Insufficient documentation

## 2015-03-20 DIAGNOSIS — K7460 Unspecified cirrhosis of liver: Secondary | ICD-10-CM | POA: Insufficient documentation

## 2015-03-20 HISTORY — DX: Unilateral inguinal hernia, without obstruction or gangrene, not specified as recurrent: K40.90

## 2015-03-30 ENCOUNTER — Encounter: Payer: Self-pay | Admitting: Family Medicine

## 2015-03-30 ENCOUNTER — Other Ambulatory Visit: Payer: Self-pay | Admitting: Family Medicine

## 2015-03-30 DIAGNOSIS — F1010 Alcohol abuse, uncomplicated: Secondary | ICD-10-CM | POA: Insufficient documentation

## 2015-04-01 ENCOUNTER — Other Ambulatory Visit: Payer: Self-pay | Admitting: *Deleted

## 2015-04-01 MED ORDER — GLUCOSE BLOOD VI STRP: ORAL_STRIP | Status: DC

## 2015-04-01 NOTE — Telephone Encounter (Signed)
Patient called office and requested that we resend test strip rx for 90 day supply because it is cheaper. Resent rx.

## 2015-04-18 ENCOUNTER — Ambulatory Visit (INDEPENDENT_AMBULATORY_CARE_PROVIDER_SITE_OTHER): Payer: 59 | Admitting: Family Medicine

## 2015-04-18 ENCOUNTER — Encounter: Payer: Self-pay | Admitting: Family Medicine

## 2015-04-18 VITALS — BP 104/64 | HR 66 | Temp 99.5°F | Resp 16 | Wt 177.0 lb

## 2015-04-18 DIAGNOSIS — L089 Local infection of the skin and subcutaneous tissue, unspecified: Secondary | ICD-10-CM | POA: Diagnosis not present

## 2015-04-18 DIAGNOSIS — E11628 Type 2 diabetes mellitus with other skin complications: Secondary | ICD-10-CM

## 2015-04-18 DIAGNOSIS — E1169 Type 2 diabetes mellitus with other specified complication: Secondary | ICD-10-CM | POA: Diagnosis not present

## 2015-04-18 DIAGNOSIS — K7460 Unspecified cirrhosis of liver: Secondary | ICD-10-CM

## 2015-04-18 MED ORDER — AMOXICILLIN-POT CLAVULANATE 875-125 MG PO TABS: 1.0000 | ORAL_TABLET | Freq: Two times a day (BID) | ORAL | Status: DC

## 2015-04-18 NOTE — Progress Notes (Signed)
Patient: [name] Male    DOB: 1951/01/24   64 y.o.   MRN: [id number] Visit Date: 04/18/2015  Today's Provider: Lelon Huh, MD   Chief Complaint  Patient presents with  . Nail Problem    Right Big Toe   Subjective:    HPI  Patient's right big toe nail is black with redness around the nail. Patient tripped and fell 4 weeks and hurt his toe. It has remained sore and painful ever since.   Follow up diabetes.  He reports blood sugars remaining in the med to upper 100s with no hypoglycemic spells.   Cirrhosis He admits to occasionally drinking beer. He continues to follow up with hematology but is not sure if he has follow up with GI. States he generally feels well.   Lab Results  Component Value Date   HGBA1C 6.4* 09/27/2014   \   Allergies  Allergen Reactions  . Hydrochlorothiazide Other (See Comments)    Reaction:  Hyponatremia   Previous Medications   ALLOPURINOL (ZYLOPRIM) 100 MG TABLET    Take 100 mg by mouth daily.   APIXABAN (ELIQUIS) 5 MG TABS TABLET    Take 1 tablet (5 mg total) by mouth 2 (two) times daily. Take 2 tabs 2x/day for 7 days then decease to 1 tab 2x/day for a total of 3 months.   CALCIUM CITRATE 200 MG TABS    Take 200 tablets by mouth daily.   GLUCOSE BLOOD (ONE TOUCH ULTRA TEST) TEST STRIP    USE 1 STRIP AS DIRECTED 4 TIMES A DAY   INSULIN ASPART (NOVOLOG) 100 UNIT/ML INJECTION    Inject 5-10 Units into the skin 3 (three) times daily as needed for high blood sugar. Pt uses as needed per sliding scale.   INSULIN GLARGINE (LANTUS) 100 UNIT/ML INJECTION    Inject 20 Units into the skin at bedtime.   LIPASE/PROTEASE/AMYLASE (CREON) 12000 UNITS CPEP CAPSULE    Take 12,000 Units by mouth 3 (three) times daily with meals.    LISINOPRIL (PRINIVIL,ZESTRIL) 10 MG TABLET    Take 10 mg by mouth daily.   MAGNESIUM GLUCONATE (MAGONATE) 500 MG TABLET    Take 500 mg by mouth daily.    MULTIPLE VITAMIN (MULTIVITAMIN WITH MINERALS) TABS TABLET    Take 1  tablet by mouth daily.   NADOLOL (CORGARD) 20 MG TABLET    Take 20 mg by mouth daily.   NIACIN (NIASPAN) 500 MG CR TABLET    Take 500 mg by mouth daily.    OMEPRAZOLE (PRILOSEC) 40 MG CAPSULE    Take 40 mg by mouth daily.    POTASSIUM GLUCONATE 550 MG TABS    Take 550 mg by mouth daily.    PYRIDOXINE (VITAMIN B-6) 50 MG TABLET    Take 50 mg by mouth daily.   RIVAROXABAN (XARELTO) 15 MG TABS TABLET    Take 1 tablet (15 mg total) by mouth 2 (two) times daily with a meal. Take 15 mg two times a day for 21 days, then switch to 20 mg once a day.   RIVAROXABAN (XARELTO) 20 MG TABS TABLET    Take 1 tablet (20 mg total) by mouth daily with supper.   SERTRALINE (ZOLOFT) 100 MG TABLET    Take 100 mg by mouth daily.   TRAMADOL (ULTRAM) 50 MG TABLET    Take 1 tablet (50 mg total) by mouth every 6 (six) hours as needed.   ZINC GLUCONATE 50  MG TABLET    Take 50 mg by mouth daily.    Review of Systems  Constitutional: Negative for fever, chills and appetite change.  Respiratory: Negative for chest tightness, shortness of breath and wheezing.   Cardiovascular: Negative for chest pain and palpitations.  Gastrointestinal: Negative for nausea, vomiting and abdominal pain.  Neurological: Negative for dizziness and light-headedness.    Social History  Substance Use Topics  . Smoking status: Never Smoker   . Smokeless tobacco: Never Used  . Alcohol Use: No   Objective:   BP 104/64 mmHg  Pulse 66  Temp(Src) 99.5 F (37.5 C) (Oral)  Resp 16  Wt 177 lb (80.287 kg)  SpO2 96%  Physical Exam   General Appearance:    Alert, cooperative, no distress  Eyes:    PERRL, conjunctiva/corneas clear, EOM's intact       Lungs:     Clear to auscultation bilaterally, respirations unlabored  Heart:    Regular rate and rhythm  Neurologic:   Awake, alert, oriented x 3. No apparent focal neurological           defect.   Extr:     Blackened right great toe nail. Distal toe tender and inflamed but no discharge.     HgbA1c=7.5     Assessment & Plan:     1. Diabetic foot infection (HCC)  - amoxicillin-clavulanate (AUGMENTIN) 875-125 MG tablet; Take 1 tablet by mouth 2 (two) times daily.  Dispense: 20 tablet; Refill: 0 - POCT glycosylated hemoglobin (Hb A1C) Call if symptoms change or if not rapidly improving.  Recheck 7-10 days  2. Cirrhosis of liver without ascites, unspecified hepatic cirrhosis type ([place]) Admonished for drinking beer. Strongly counseled to completely abstain from alcohol.         Lelon Huh, MD  [facility]

## 2015-04-19 LAB — POCT GLYCOSYLATED HEMOGLOBIN (HGB A1C)
ESTIMATED AVERAGE GLUCOSE: 169
HEMOGLOBIN A1C: 7.5

## 2015-04-27 ENCOUNTER — Encounter: Payer: Self-pay | Admitting: Family Medicine

## 2015-04-27 ENCOUNTER — Ambulatory Visit (INDEPENDENT_AMBULATORY_CARE_PROVIDER_SITE_OTHER): Payer: 59 | Admitting: Family Medicine

## 2015-04-27 VITALS — BP 118/72 | HR 60 | Temp 98.9°F | Resp 16 | Wt 170.0 lb

## 2015-04-27 DIAGNOSIS — E1169 Type 2 diabetes mellitus with other specified complication: Secondary | ICD-10-CM

## 2015-04-27 DIAGNOSIS — L089 Local infection of the skin and subcutaneous tissue, unspecified: Secondary | ICD-10-CM

## 2015-04-27 DIAGNOSIS — E11628 Type 2 diabetes mellitus with other skin complications: Secondary | ICD-10-CM

## 2015-04-27 MED ORDER — AMOXICILLIN-POT CLAVULANATE 875-125 MG PO TABS: 1.0000 | ORAL_TABLET | Freq: Two times a day (BID) | ORAL | Status: AC

## 2015-04-27 NOTE — Progress Notes (Signed)
Patient: [name] Male    DOB: [birth year]   64 y.o.   MRN: [id number] Visit Date: 04/27/2015  Today's Provider: Lelon Huh, MD   Chief Complaint  Patient presents with  . Wound Infection    Follow up of Diabetic foot infection   Subjective:    HPI  Diabetic foot infection Recheck (Right foot):  Last office visit was 04/18/2015. Patient was started on Augmentin 1 pill two times daily for 10 days. Patient states he forgot to take the 2nd pill of Augmentin a couple of days.He reports his foot looks better but is not completely healed. He states he occasionally has some pain in his big toe. He also reports his toenail has come off. Denies any fevers, chills, or sweats.      Allergies  Allergen Reactions  . Hydrochlorothiazide Other (See Comments)    Reaction:  Hyponatremia   Previous Medications   ALLOPURINOL (ZYLOPRIM) 100 MG TABLET    Take 100 mg by mouth daily.   AMOXICILLIN-CLAVULANATE (AUGMENTIN) 875-125 MG TABLET    Take 1 tablet by mouth 2 (two) times daily.   CALCIUM CITRATE 200 MG TABS    Take 200 mg by mouth daily.    GLUCOSE BLOOD (ONE TOUCH ULTRA TEST) TEST STRIP    USE 1 STRIP AS DIRECTED 4 TIMES A DAY   INSULIN ASPART (NOVOLOG) 100 UNIT/ML INJECTION    Inject 5-10 Units into the skin 3 (three) times daily as needed for high blood sugar. Pt uses as needed per sliding scale.   INSULIN GLARGINE (LANTUS) 100 UNIT/ML INJECTION    Inject 20 Units into the skin at bedtime.   LIPASE/PROTEASE/AMYLASE (CREON) 12000 UNITS CPEP CAPSULE    Take 12,000 Units by mouth 3 (three) times daily with meals.    LISINOPRIL (PRINIVIL,ZESTRIL) 10 MG TABLET    Take 10 mg by mouth daily.   MAGNESIUM GLUCONATE (MAGONATE) 500 MG TABLET    Take 500 mg by mouth daily.    MULTIPLE VITAMIN (MULTIVITAMIN WITH MINERALS) TABS TABLET    Take 1 tablet by mouth daily.   NADOLOL (CORGARD) 20 MG TABLET    Take 20 mg by mouth daily.   NIACIN (NIASPAN) 500 MG CR TABLET    Take 500 mg by mouth  daily.    OMEPRAZOLE (PRILOSEC) 40 MG CAPSULE    Take 40 mg by mouth daily.    POTASSIUM GLUCONATE 550 MG TABS    Take 550 mg by mouth daily.    PYRIDOXINE (VITAMIN B-6) 50 MG TABLET    Take 50 mg by mouth daily.   RIVAROXABAN (XARELTO) 15 MG TABS TABLET    Take 1 tablet (15 mg total) by mouth 2 (two) times daily with a meal. Take 15 mg two times a day for 21 days, then switch to 20 mg once a day.   RIVAROXABAN (XARELTO) 20 MG TABS TABLET    Take 1 tablet (20 mg total) by mouth daily with supper.   SERTRALINE (ZOLOFT) 100 MG TABLET    Take 100 mg by mouth daily.   TRAMADOL (ULTRAM) 50 MG TABLET    Take 1 tablet (50 mg total) by mouth every 6 (six) hours as needed.   ZINC GLUCONATE 50 MG TABLET    Take 50 mg by mouth daily.    Review of Systems  Constitutional: Negative for fever, chills, diaphoresis, appetite change and fatigue.  Respiratory: Negative for chest tightness, shortness of breath and wheezing.  Cardiovascular: Negative for chest pain and palpitations.  Gastrointestinal: Negative for nausea, vomiting and abdominal pain.  Skin: Positive for color change (redness around big toe of right foot).    Social History  Substance Use Topics  . Smoking status: Never Smoker   . Smokeless tobacco: Never Used  . Alcohol Use: No   Objective:   BP 118/72 mmHg  Pulse 60  Temp(Src) 98.9 F (37.2 C) (Oral)  Resp 16  Wt 170 lb (77.111 kg)  Physical Exam  Minimal erythema around right great toe nail bed. No swelling or tenderness.     Assessment & Plan:     1. Diabetic foot infection ([place]) Nearly resolved. Stay on Augmentin for 7 additional days.  - amoxicillin-clavulanate (AUGMENTIN) 875-125 MG tablet; Take 1 tablet by mouth 2 (two) times daily.  Dispense: 14 tablet; Refill: 0       Lelon Huh, MD  [facility]

## 2015-04-28 ENCOUNTER — Ambulatory Visit: Payer: 59 | Admitting: Family Medicine

## 2015-05-03 ENCOUNTER — Inpatient Hospital Stay: Payer: 59 | Attending: Oncology | Admitting: Oncology

## 2015-05-03 VITALS — BP 163/96 | HR 60 | Temp 97.5°F | Resp 18 | Wt 173.9 lb

## 2015-05-03 DIAGNOSIS — K7030 Alcoholic cirrhosis of liver without ascites: Secondary | ICD-10-CM | POA: Diagnosis not present

## 2015-05-03 DIAGNOSIS — Z794 Long term (current) use of insulin: Secondary | ICD-10-CM | POA: Diagnosis not present

## 2015-05-03 DIAGNOSIS — I81 Portal vein thrombosis: Secondary | ICD-10-CM | POA: Diagnosis not present

## 2015-05-03 DIAGNOSIS — Z8719 Personal history of other diseases of the digestive system: Secondary | ICD-10-CM | POA: Diagnosis not present

## 2015-05-03 DIAGNOSIS — F1020 Alcohol dependence, uncomplicated: Secondary | ICD-10-CM

## 2015-05-03 DIAGNOSIS — I10 Essential (primary) hypertension: Secondary | ICD-10-CM | POA: Diagnosis not present

## 2015-05-03 DIAGNOSIS — I8500 Esophageal varices without bleeding: Secondary | ICD-10-CM | POA: Diagnosis not present

## 2015-05-03 DIAGNOSIS — E119 Type 2 diabetes mellitus without complications: Secondary | ICD-10-CM | POA: Diagnosis not present

## 2015-05-03 DIAGNOSIS — R748 Abnormal levels of other serum enzymes: Secondary | ICD-10-CM | POA: Diagnosis not present

## 2015-05-03 DIAGNOSIS — K861 Other chronic pancreatitis: Secondary | ICD-10-CM | POA: Insufficient documentation

## 2015-05-03 DIAGNOSIS — Z79899 Other long term (current) drug therapy: Secondary | ICD-10-CM | POA: Diagnosis not present

## 2015-05-04 ENCOUNTER — Other Ambulatory Visit: Payer: Self-pay | Admitting: *Deleted

## 2015-05-07 ENCOUNTER — Other Ambulatory Visit: Payer: Self-pay | Admitting: Family Medicine

## 2015-05-13 ENCOUNTER — Other Ambulatory Visit: Payer: 59

## 2015-05-13 NOTE — Progress Notes (Signed)
[place]  [phone] [phone]) [phone]  ID: [name] OB: [birth year]  MR#: [id number]  BVQ#:[id number]  Patient Care Team: Birdie Sons, MD as PCP - General (Family Medicine) Lollie Sails, MD as Consulting Physician (Gastroenterology)  CHIEF COMPLAINT:  Chief Complaint  Patient presents with  . portal vein thrombosis    INTERVAL HISTORY: Patient returns to clinic today for further evaluation, discussion of his laboratory work, and to determine whether he should continue anticoagulation. He continues to feel well and is asymptomatic. He has no neurologic complaints. He denies any recent fevers or illnesses. He denies any pain. He has no chest pain or shortness of breath. He denies any nausea, vomiting, constipation, or diarrhea. He has no melena or hematochezia. He has no urinary complaints. Patient feels at his baseline and offers no specific complaints today.  REVIEW OF SYSTEMS:   Review of Systems  Constitutional: Negative.   Respiratory: Negative.   Cardiovascular: Negative.   Gastrointestinal: Negative.   Musculoskeletal: Negative.   Neurological: Negative.   Endo/Heme/Allergies: Does not bruise/bleed easily.    As per HPI. Otherwise, a complete review of systems is negatve.  PAST MEDICAL HISTORY: Past Medical History  Diagnosis Date  . Hypertension   . Pancreatitis   . Diabetes mellitus without complication ([place])   . Elevated liver enzymes   . 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 Chronic pancreatitis ([place])   . Alcoholism ([place])   . Esophageal varices ([place])     PAST SURGICAL HISTORY: Past Surgical History  Procedure Laterality Date  . Tonsillectomy    . Cholecystectomy  2006  . Hernia repair      Inguinal Hernia Repai. Left: 07/28/2013 Dr. [name]; 2nd repair done 04/30/2014    FAMILY HISTORY Family History  Problem Relation Age of Onset  . Colon polyps Father   . Lung cancer Father   . Hypertension Father   .  Hypertension Brother   . Hypertension Brother   . Diabetes Brother     type 2  . Congestive Heart Failure Brother        ADVANCED DIRECTIVES:    HEALTH MAINTENANCE: Social History  Substance Use Topics  . Smoking status: Never Smoker   . Smokeless tobacco: Never Used  . Alcohol Use: No     Colonoscopy:  PAP:  Bone density:  Lipid panel:  Allergies  Allergen Reactions  . Hydrochlorothiazide Other (See Comments)    Reaction:  Hyponatremia    Current Outpatient Prescriptions  Medication Sig Dispense Refill  . allopurinol (ZYLOPRIM) 100 MG tablet Take 100 mg by mouth daily.    . Calcium Citrate 200 MG TABS Take 200 mg by mouth daily.     Marland Kitchen glucose blood (ONE TOUCH ULTRA TEST) test strip USE 1 STRIP AS DIRECTED 4 TIMES A DAY 360 each 1  . insulin aspart (NOVOLOG) 100 UNIT/ML injection Inject 5-10 Units into the skin 3 (three) times daily as needed for high blood sugar. Pt uses as needed per sliding scale.    . insulin glargine (LANTUS) 100 UNIT/ML injection Inject 20 Units into the skin at bedtime.    . lipase/protease/amylase (CREON) 12000 UNITS CPEP capsule Take 12,000 Units by mouth 3 (three) times daily with meals.     . magnesium gluconate (MAGONATE) 500 MG tablet Take 500 mg by mouth daily.     . Multiple Vitamin (MULTIVITAMIN WITH MINERALS) TABS tablet Take 1 tablet by mouth daily.    . nadolol (CORGARD) 20  MG tablet Take 20 mg by mouth daily.    . niacin (NIASPAN) 500 MG CR tablet Take 500 mg by mouth daily.     Marland Kitchen omeprazole (PRILOSEC) 40 MG capsule Take 40 mg by mouth daily.     . Potassium Gluconate 550 MG TABS Take 550 mg by mouth daily.     Marland Kitchen pyridOXINE (VITAMIN B-6) 50 MG tablet Take 50 mg by mouth daily.    . rivaroxaban (XARELTO) 20 MG TABS tablet Take 1 tablet (20 mg total) by mouth daily with supper. 30 tablet 2  . sertraline (ZOLOFT) 100 MG tablet Take 100 mg by mouth daily.    . traMADol (ULTRAM) 50 MG tablet Take 1 tablet (50 mg total) by mouth every 6  (six) hours as needed. 12 tablet 0  . zinc gluconate 50 MG tablet Take 50 mg by mouth daily.    Marland Kitchen lisinopril (PRINIVIL,ZESTRIL) 10 MG tablet TAKE 1 TABLET BY MOUTH EVERY DAY 90 tablet 4   No current facility-administered medications for this visit.    OBJECTIVE: Filed Vitals:   05/03/15 1153  BP: 163/96  Pulse: 60  Temp: 97.5 F (36.4 C)  Resp: 18     Body mass index is 28.09 kg/(m^2).    ECOG FS:0 - Asymptomatic  General: Well-developed, well-nourished, no acute distress. Eyes: Pink conjunctiva, anicteric sclera. Lungs: Clear to auscultation bilaterally. Heart: Regular rate and rhythm. No rubs, murmurs, or gallops. Abdomen: Soft, nontender, nondistended. No organomegaly noted, normoactive bowel sounds. Musculoskeletal: No edema, cyanosis, or clubbing. Neuro: Alert, answering all questions appropriately. Cranial nerves grossly intact. Skin: No rashes or petechiae noted. Psych: Normal affect.  LAB RESULTS:  Lab Results  Component Value Date   NA 139 03/04/2015   K 4.0 03/04/2015   CL 101 03/04/2015   CO2 26 03/04/2015   GLUCOSE 223* 03/04/2015   BUN 12 03/04/2015   CREATININE 0.79 03/04/2015   CALCIUM 9.2 03/04/2015   PROT 7.3 11/24/2014   ALBUMIN 4.3 11/24/2014   AST 96* 11/24/2014   ALT 95* 11/24/2014   ALKPHOS 212* 11/24/2014   BILITOT 2.2* 11/24/2014   GFRNONAA >60 03/04/2015   GFRAA >60 03/04/2015    Lab Results  Component Value Date   WBC 5.8 03/04/2015   NEUTROABS 3.8 11/24/2014   HGB 14.8 03/04/2015   HCT 43.4 03/04/2015   MCV 97.7 03/04/2015   PLT 217 03/04/2015     STUDIES: No results found.  ASSESSMENT: Portal vein thrombosis.  PLAN:    1. Portal vein thrombosis: Patient has no obvious etiology other than a history of cirrhosis secondary to alcoholism for his portal vein thrombosis. Full hypercoagulable workup was reported as negative. Patient does not require any further anticoagulation and instructed to complete his current prescription  and then discontinue.  No further follow-up is necessary. 2. History of ecchymosis/cirrhosis: Patient has a normal PT and PTT, although his von Willebrand's panel is slightly decreased. This may be related to his underlying cirrhosis and not a true diagnosis of von Willebrand's. Patient does not report any further easy bleeding or bruising. Recommending repeat testing in the next 3-6 months for further evaluation. Have discontinued anticoagulation as above given patient's increased risk of bleeding and a negative hypercoagulable workup.  Patient expressed understanding and was in agreement with this plan. He also understands that He can call clinic at any time with any questions, concerns, or complaints.     Lloyd Huger, MD   05/13/2015 1:05 PM

## 2015-05-17 ENCOUNTER — Inpatient Hospital Stay: Payer: 59 | Attending: Oncology

## 2015-06-16 LAB — HM DIABETES EYE EXAM

## 2015-06-22 ENCOUNTER — Other Ambulatory Visit: Payer: Self-pay | Admitting: *Deleted

## 2015-06-22 MED ORDER — OMEPRAZOLE 40 MG PO CPDR: 40.0000 mg | DELAYED_RELEASE_CAPSULE | Freq: Every day | ORAL | Status: DC

## 2015-06-24 ENCOUNTER — Encounter: Payer: Self-pay | Admitting: *Deleted

## 2015-07-12 ENCOUNTER — Telehealth: Payer: Self-pay | Admitting: *Deleted

## 2015-07-12 NOTE — Telephone Encounter (Signed)
-----   Message from Birdie Sons, MD sent at 07/12/2015 12:56 PM EST ----- Please advise patient that we received notice that insurance no long covers Lantus vials, but does cover Levemir and [place]. Does he need a new prescription now, or does he have enough to last for awhile? Thanks!

## 2015-07-12 NOTE — Telephone Encounter (Signed)
Patient stated that he was aware of coverage changes. He stated that he had enough Lantus to last for a while and he will discuss changes with Dr. Caryn Section at his up-coming ov 07/31/2014.

## 2015-08-01 ENCOUNTER — Encounter: Payer: 59 | Admitting: Family Medicine

## 2015-08-12 ENCOUNTER — Other Ambulatory Visit: Payer: Self-pay | Admitting: Family Medicine

## 2015-08-12 ENCOUNTER — Encounter: Payer: 59 | Admitting: Family Medicine

## 2015-08-12 MED ORDER — INSULIN ASPART 100 UNIT/ML ~~LOC~~ SOLN: SUBCUTANEOUS | Status: DC

## 2015-08-20 ENCOUNTER — Other Ambulatory Visit: Payer: Self-pay | Admitting: Family Medicine

## 2015-08-22 ENCOUNTER — Other Ambulatory Visit: Payer: Self-pay | Admitting: Family Medicine

## 2015-08-22 NOTE — Telephone Encounter (Signed)
Refills 90-day supply sent to [facility] order on the following:   insulin aspart (NOVOLOG) 100 UNIT/ML injection insulin glargine (LANTUS) 100 UNIT/ML injection (generic b/c his insurance will not cover name brand Lantus any longer)

## 2015-08-23 ENCOUNTER — Encounter: Payer: 59 | Admitting: Family Medicine

## 2015-08-23 NOTE — Telephone Encounter (Signed)
Does he want the pen or the vials. It looks like his insurance covers the pens now.

## 2015-08-24 NOTE — Telephone Encounter (Signed)
LMOVM for pt to return call 

## 2015-08-24 NOTE — Telephone Encounter (Signed)
Pt returned call and would like to get both RX in vials and stated he needs more syringes. Pt also wanted to make sure we know he needs the generic for Lantus and would like it sent to [place]. Thanks TNP

## 2015-08-25 NOTE — Telephone Encounter (Signed)
Spoke with patient, and he agrees to try the pens for both Novolog and Lantus. Patient would like to pick up the printed prescriptions tomorrow around lunch time. He says his wife has an appointment at [facility] and he can swing by our office to pick up prescriptions while she there.

## 2015-08-25 NOTE — Telephone Encounter (Signed)
I didn't realize that the generic for Lantus is only comes in a pen.  I would recommend changing the Novolog to the pen as well.

## 2015-08-25 NOTE — Telephone Encounter (Signed)
Left message for patient to call back  

## 2015-08-26 MED ORDER — INSULIN ASPART 100 UNIT/ML FLEXPEN: PEN_INJECTOR | SUBCUTANEOUS | Status: DC

## 2015-08-26 MED ORDER — INSULIN GLARGINE 100 UNIT/ML SOLOSTAR PEN: PEN_INJECTOR | SUBCUTANEOUS | Status: DC

## 2015-08-31 ENCOUNTER — Telehealth: Payer: Self-pay | Admitting: Family Medicine

## 2015-08-31 MED ORDER — PEN NEEDLES 31G X 5 MM MISC: 1.0000 | Freq: Three times a day (TID) | Status: DC

## 2015-08-31 NOTE — Telephone Encounter (Signed)
Pease advise.

## 2015-08-31 NOTE — Telephone Encounter (Signed)
[name] with [facility] in [place] called to request an RX for pen needles for Novalog Flex Pen. [name] stated they received the RX for the Flex Pen but the needles do not come with it. Thanks TNP

## 2015-09-01 ENCOUNTER — Encounter: Payer: 59 | Admitting: Family Medicine

## 2015-09-26 ENCOUNTER — Other Ambulatory Visit: Payer: Self-pay | Admitting: Family Medicine

## 2015-09-29 ENCOUNTER — Other Ambulatory Visit: Payer: Self-pay | Admitting: Family Medicine

## 2015-11-18 ENCOUNTER — Encounter: Payer: Self-pay | Admitting: *Deleted

## 2015-11-21 ENCOUNTER — Ambulatory Visit: Payer: 59 | Admitting: Certified Registered Nurse Anesthetist

## 2015-11-21 ENCOUNTER — Encounter
Admission: RE | Disposition: A | Discharge status: Home / Self Care | Payer: Self-pay | Source: Ambulatory Visit | Attending: Unknown Physician Specialty

## 2015-11-21 ENCOUNTER — Ambulatory Visit
Admission: RE | Admit: 2015-11-21 | Discharge: 2015-11-21 | Disposition: A | Discharge status: Home / Self Care | Payer: 59 | Source: Ambulatory Visit | Attending: Unknown Physician Specialty | Admitting: Unknown Physician Specialty

## 2015-11-21 DIAGNOSIS — K860 Alcohol-induced chronic pancreatitis: Secondary | ICD-10-CM | POA: Diagnosis not present

## 2015-11-21 DIAGNOSIS — I10 Essential (primary) hypertension: Secondary | ICD-10-CM | POA: Insufficient documentation

## 2015-11-21 DIAGNOSIS — Z8249 Family history of ischemic heart disease and other diseases of the circulatory system: Secondary | ICD-10-CM | POA: Diagnosis not present

## 2015-11-21 DIAGNOSIS — K640 First degree hemorrhoids: Secondary | ICD-10-CM | POA: Diagnosis not present

## 2015-11-21 DIAGNOSIS — Z801 Family history of malignant neoplasm of trachea, bronchus and lung: Secondary | ICD-10-CM | POA: Diagnosis not present

## 2015-11-21 DIAGNOSIS — E119 Type 2 diabetes mellitus without complications: Secondary | ICD-10-CM | POA: Insufficient documentation

## 2015-11-21 DIAGNOSIS — Z1211 Encounter for screening for malignant neoplasm of colon: Secondary | ICD-10-CM | POA: Diagnosis not present

## 2015-11-21 DIAGNOSIS — D124 Benign neoplasm of descending colon: Secondary | ICD-10-CM | POA: Diagnosis not present

## 2015-11-21 DIAGNOSIS — Z9049 Acquired absence of other specified parts of digestive tract: Secondary | ICD-10-CM | POA: Insufficient documentation

## 2015-11-21 DIAGNOSIS — Z833 Family history of diabetes mellitus: Secondary | ICD-10-CM | POA: Diagnosis not present

## 2015-11-21 DIAGNOSIS — Z8371 Family history of colonic polyps: Secondary | ICD-10-CM | POA: Insufficient documentation

## 2015-11-21 DIAGNOSIS — Z86010 Personal history of colonic polyps: Secondary | ICD-10-CM | POA: Insufficient documentation

## 2015-11-21 DIAGNOSIS — Z79899 Other long term (current) drug therapy: Secondary | ICD-10-CM | POA: Insufficient documentation

## 2015-11-21 DIAGNOSIS — K7030 Alcoholic cirrhosis of liver without ascites: Secondary | ICD-10-CM | POA: Insufficient documentation

## 2015-11-21 DIAGNOSIS — Z794 Long term (current) use of insulin: Secondary | ICD-10-CM | POA: Diagnosis not present

## 2015-11-21 DIAGNOSIS — I8510 Secondary esophageal varices without bleeding: Secondary | ICD-10-CM | POA: Insufficient documentation

## 2015-11-21 DIAGNOSIS — Z7901 Long term (current) use of anticoagulants: Secondary | ICD-10-CM | POA: Insufficient documentation

## 2015-11-21 HISTORY — PX: COLONOSCOPY WITH PROPOFOL: SHX5780

## 2015-11-21 HISTORY — DX: Gastritis, unspecified, without bleeding: K29.70

## 2015-11-21 LAB — GLUCOSE, CAPILLARY: GLUCOSE-CAPILLARY: 165 mg/dL — AB (ref 65–99)

## 2015-11-21 SURGERY — COLONOSCOPY WITH PROPOFOL
Anesthesia: General

## 2015-11-21 MED ORDER — MIDAZOLAM HCL 2 MG/2ML IJ SOLN
INTRAMUSCULAR | Status: DC | PRN
  Administered 2015-11-21: 1 mg via INTRAVENOUS

## 2015-11-21 MED ORDER — PROPOFOL 500 MG/50ML IV EMUL
INTRAVENOUS | Status: DC | PRN
  Administered 2015-11-21: 130 ug/kg/min via INTRAVENOUS

## 2015-11-21 MED ORDER — SODIUM CHLORIDE 0.9 % IV SOLN: INTRAVENOUS | Status: DC

## 2015-11-21 MED ORDER — PROPOFOL 10 MG/ML IV BOLUS
INTRAVENOUS | Status: DC | PRN
  Administered 2015-11-21: 10 mg via INTRAVENOUS
  Administered 2015-11-21 (×2): 30 mg via INTRAVENOUS

## 2015-11-21 MED ORDER — LIDOCAINE HCL (CARDIAC) 20 MG/ML IV SOLN
INTRAVENOUS | Status: DC | PRN
  Administered 2015-11-21: 30 mg via INTRAVENOUS

## 2015-11-21 MED ORDER — SODIUM CHLORIDE 0.9 % IV SOLN
INTRAVENOUS | Status: DC
  Administered 2015-11-21: 1000 mL via INTRAVENOUS

## 2015-11-21 NOTE — Anesthesia Preprocedure Evaluation (Signed)
Anesthesia Evaluation  Patient identified by MRN, date of birth, ID band Patient awake    Reviewed: Allergy & Precautions, H&P , NPO status , Patient's Chart, lab work & pertinent test results, reviewed documented beta blocker date and time   Airway Mallampati: II   Neck ROM: full    Dental  (+) Poor Dentition, Missing   Pulmonary neg pulmonary ROS,    Pulmonary exam normal        Cardiovascular hypertension, negative cardio ROS Normal cardiovascular exam Rate:Normal     Neuro/Psych PSYCHIATRIC DISORDERS negative neurological ROS  negative psych ROS   GI/Hepatic negative GI ROS, Neg liver ROS, GERD  ,  Endo/Other  negative endocrine ROSdiabetes  Renal/GU negative Renal ROS  negative genitourinary   Musculoskeletal   Abdominal   Peds  Hematology negative hematology ROS (+)   Anesthesia Other Findings Past Medical History:   Hypertension                                                 Pancreatitis                                                 Diabetes mellitus without complication (HCC)                 Elevated liver enzymes                                       Cirrhosis with alcoholism (HCC)                              Chronic pancreatitis (HCC)                                   Alcoholism (HCC)                                             Esophageal varices (HCC)                                     Gastritis                                                    Pancreatitis                                               Past Surgical History:   TONSILLECTOMY  CHOLECYSTECTOMY                                  2006         HERNIA REPAIR                                                   Comment:Inguinal Hernia Repai. Left: 07/28/2013 Dr.               [name]; 2nd repair done 04/30/2014   COLONOSCOPY WITH ESOPHAGOGASTRODUODENOSCOPY (E*             BMI    Body Mass Index    26.64 kg/m 2     Reproductive/Obstetrics                             Anesthesia Physical Anesthesia Plan  ASA: III  Anesthesia Plan: General   Post-op Pain Management:    Induction:   Airway Management Planned:   Additional Equipment:   Intra-op Plan:   Post-operative Plan:   Informed Consent: I have reviewed the patients History and Physical, chart, labs and discussed the procedure including the risks, benefits and alternatives for the proposed anesthesia with the patient or authorized representative who has indicated his/her understanding and acceptance.   Dental Advisory Given  Plan Discussed with: CRNA  Anesthesia Plan Comments:         Anesthesia Quick Evaluation

## 2015-11-21 NOTE — H&P (Signed)
Primary Care Physician:  Lelon Huh, MD Primary Gastroenterologist:  Dr. Vira Agar  Pre-Procedure History & Physical: HPI:  [name] is a 65 y.o. male is here for an colonoscopy.   Past Medical History  Diagnosis Date  . Hypertension   . Pancreatitis   . Diabetes mellitus without complication ([place])   . Elevated liver enzymes   . 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 Chronic pancreatitis ([place])   . Alcoholism ([place])   . Esophageal varices ([place])   . Gastritis   . Pancreatitis     Past Surgical History  Procedure Laterality Date  . Tonsillectomy    . Cholecystectomy  2006  . Hernia repair      Inguinal Hernia Repai. Left: 07/28/2013 Dr. [name]; 2nd repair done 04/30/2014  . Colonoscopy with esophagogastroduodenoscopy ([place])      Prior to Admission medications   Medication Sig Start Date End Date Taking? Authorizing Provider  allopurinol (ZYLOPRIM) 100 MG tablet TAKE 1 TABLET DAILY 09/26/15  Yes Birdie Sons, MD  insulin aspart (NOVOLOG FLEXPEN) 100 UNIT/ML FlexPen Up to 10 units three times a day before meals 08/26/15  Yes Birdie Sons, MD  Insulin Glargine [facility]) 100 UNIT/ML Solostar Pen 20 units at bedtime 08/26/15  Yes Birdie Sons, MD  Insulin Pen Needle (PEN NEEDLES) 31G X 5 MM MISC 1 Device by Does not apply route 3 (three) times daily. 08/31/15  Yes Birdie Sons, MD  lipase/protease/amylase (CREON) 12000 UNITS CPEP capsule Take 12,000 Units by mouth 3 (three) times daily with meals.    Yes Historical Provider, MD  lisinopril (PRINIVIL,ZESTRIL) 10 MG tablet TAKE 1 TABLET BY MOUTH EVERY DAY 05/07/15  Yes Birdie Sons, MD  magnesium gluconate (MAGONATE) 500 MG tablet Take 500 mg by mouth daily.    Yes Historical Provider, MD  Multiple Vitamin (MULTIVITAMIN WITH MINERALS) TABS tablet Take 1 tablet by mouth daily.   Yes Historical Provider, MD  nadolol (CORGARD) 20 MG tablet Take 20 mg by mouth daily.   Yes Historical Provider, MD  niacin (NIASPAN) 500  MG CR tablet Take 500 mg by mouth daily.    Yes Historical Provider, MD  omeprazole (PRILOSEC) 40 MG capsule Take 1 capsule (40 mg total) by mouth daily. 06/22/15  Yes Birdie Sons, MD  ONE TOUCH ULTRA TEST test strip USE 1 STRIP AS DIRECTED 4 TIMES A DAY 10/02/15  Yes Birdie Sons, MD  Potassium Gluconate 550 MG TABS Take 550 mg by mouth daily.    Yes Historical Provider, MD  pyridOXINE (VITAMIN B-6) 50 MG tablet Take 50 mg by mouth daily.   Yes Historical Provider, MD  sertraline (ZOLOFT) 100 MG tablet TAKE 1 TABLET DAILY 08/20/15  Yes Birdie Sons, MD  zinc gluconate 50 MG tablet Take 50 mg by mouth daily.   Yes Historical Provider, MD  Calcium Citrate 200 MG TABS Take 200 mg by mouth daily. Reported on 11/21/2015    Historical Provider, MD  rivaroxaban (XARELTO) 20 MG TABS tablet Take 1 tablet (20 mg total) by mouth daily with supper. Patient not taking: Reported on 11/21/2015 02/07/15   Lloyd Huger, MD  traMADol (ULTRAM) 50 MG tablet Take 1 tablet (50 mg total) by mouth every 6 (six) hours as needed. Patient not taking: Reported on 11/21/2015 02/20/15 02/20/16  Orbie Pyo, MD    Allergies as of 11/11/2015 - Review Complete 05/03/2015  Allergen Reaction Noted  . Hydrochlorothiazide Other (See Comments)  02/28/2015    Family History  Problem Relation Age of Onset  . Colon polyps Father   . Lung cancer Father   . Hypertension Father   . Hypertension Brother   . Hypertension Brother   . Diabetes Brother     type 2  . Congestive Heart Failure Brother     Social History   Social History  . Marital Status: Married    Spouse Name: N/A  . Number of Children: N/A  . Years of Education: N/A   Occupational History  . Not on file.   Social History Main Topics  . Smoking status: Never Smoker   . Smokeless tobacco: Never Used  . Alcohol Use: No  . Drug Use: No  . Sexual Activity: Yes   Other Topics Concern  . Not on file   Social History Narrative     Review of Systems: See HPI, otherwise negative ROS  Physical Exam: BP 155/93 mmHg  Pulse 64  Temp(Src) 96.5 F (35.8 C) (Tympanic)  Resp 16  Ht 5\' 6"  (1.676 m)  Wt 74.844 kg (165 lb)  BMI 26.64 kg/m2  SpO2 98% General:   Alert,  pleasant and cooperative in NAD Head:  Normocephalic and atraumatic. Neck:  Supple; no masses or thyromegaly. Lungs:  Clear throughout to auscultation.    Heart:  Regular rate and rhythm. Abdomen:  Soft, nontender and nondistended. Normal bowel sounds, without guarding, and without rebound.   Neurologic:  Alert and  oriented x4;  grossly normal neurologically.  Impression/Plan: [name] is here for an colonoscopy to be performed for [facility] colon polyps  Risks, benefits, limitations, and alternatives regarding  colonoscopy have been reviewed with the patient.  Questions have been answered.  All parties agreeable.   Gaylyn Cheers, MD  11/21/2015, 7:32 AM

## 2015-11-21 NOTE — Op Note (Signed)
[facility] Patient Name: [name] Procedure Date: 11/21/2015 7:28 AM MRN: [id number] Account #: [id number] Date of Birth: 26-Jan-1951 Admit Type: Outpatient Age: 64 Room: [facility] ENDO ROOM 1 Gender: Male Note Status: Finalized Procedure:            Colonoscopy Indications:          High risk colon cancer surveillance: Personal history                        of colonic polyps Providers:            Manya Silvas, MD Referring MD:         [name]. Caryn Section, MD (Referring MD) Medicines:            Propofol per Anesthesia Complications:        No immediate complications. Procedure:            Pre-Anesthesia Assessment:                       - After reviewing the risks and benefits, the patient                        was deemed in satisfactory condition to undergo the                        procedure.                       After obtaining informed consent, the colonoscope was                        passed under direct vision. Throughout the procedure,                        the patient's blood pressure, pulse, and oxygen                        saturations were monitored continuously. The                        Colonoscope was introduced through the anus and                        advanced to the the cecum, identified by appendiceal                        orifice and ileocecal valve. The colonoscopy was                        performed without difficulty. The patient tolerated the                        procedure well. The quality of the bowel preparation                        was good. Findings:      A diminutive polyp was found in the descending colon. The polyp was       sessile. The polyp was removed with a jumbo cold forceps. Resection and       retrieval were complete.      Internal hemorrhoids were found  during endoscopy. The hemorrhoids were       small and Grade I (internal hemorrhoids that do not prolapse).      The exam was otherwise  without abnormality. Impression:           - One diminutive polyp in the descending colon, removed                        with a jumbo cold forceps. Resected and retrieved.                       - Internal hemorrhoids.                       - The examination was otherwise normal. Recommendation:       - Await pathology results. Manya Silvas, MD 11/21/2015 8:05:06 AM This report has been signed electronically. Number of Addenda: 0 Note Initiated On: 11/21/2015 7:28 AM Scope Withdrawal Time: 0 hours 14 minutes 39 seconds  Total Procedure Duration: 0 hours 21 minutes 20 seconds       [facility]

## 2015-11-21 NOTE — Transfer of Care (Signed)
Immediate Anesthesia Transfer of Care Note  Patient: [name]  Procedure(s) Performed: Procedure(s): COLONOSCOPY WITH PROPOFOL (N/A)  Patient Location: PACU  Anesthesia Type:General  Level of Consciousness: awake, alert  and oriented  Airway & Oxygen Therapy: Patient Spontanous Breathing and Patient connected to nasal cannula oxygen  Post-op Assessment: Report given to RN and Post -op Vital signs reviewed and stable  Post vital signs: Reviewed and stable  Last Vitals:  Filed Vitals:   11/21/15 0658 11/21/15 0807  BP: 155/93 116/83  Pulse: 64 66  Temp: 35.8 C 36.1 C  Resp: 16 16    Last Pain: There were no vitals filed for this visit.       Complications: No apparent anesthesia complications

## 2015-11-21 NOTE — Anesthesia Procedure Notes (Signed)
Date/Time: 11/21/2015 7:38 AM Performed by: Johnna Acosta Pre-anesthesia Checklist: Patient identified, Emergency Drugs available, Suction available, Patient being monitored and Timeout performed Patient Re-evaluated:Patient Re-evaluated prior to inductionOxygen Delivery Method: Nasal cannula

## 2015-11-23 ENCOUNTER — Encounter: Payer: Self-pay | Admitting: Unknown Physician Specialty

## 2015-11-23 LAB — SURGICAL PATHOLOGY

## 2015-11-24 NOTE — Anesthesia Postprocedure Evaluation (Signed)
Anesthesia Post Note  Patient: [name]  Procedure(s) Performed: Procedure(s) (LRB): COLONOSCOPY WITH PROPOFOL (N/A)  Patient location during evaluation: PACU Anesthesia Type: General Level of consciousness: awake and alert Pain management: pain level controlled Vital Signs Assessment: post-procedure vital signs reviewed and stable Respiratory status: spontaneous breathing, nonlabored ventilation, respiratory function stable and patient connected to nasal cannula oxygen Cardiovascular status: blood pressure returned to baseline and stable Postop Assessment: no signs of nausea or vomiting Anesthetic complications: no    Last Vitals:  Filed Vitals:   11/21/15 0827 11/21/15 0837  BP: 154/91 158/92  Pulse: 52 51  Temp:    Resp: 21 19    Last Pain:  Filed Vitals:   11/22/15 0747  PainSc: 0-No pain                 Molli Barrows

## 2016-06-28 ENCOUNTER — Other Ambulatory Visit: Payer: Self-pay | Admitting: Family Medicine

## 2016-06-28 NOTE — Telephone Encounter (Signed)
Patient has not been seen in over a year. He needs to schedule appointment before refill can be approved.

## 2016-06-28 NOTE — Telephone Encounter (Signed)
LMTCB

## 2016-07-04 NOTE — Telephone Encounter (Signed)
NA

## 2016-07-10 NOTE — Telephone Encounter (Signed)
LMTCB. sd  

## 2016-07-13 ENCOUNTER — Other Ambulatory Visit: Payer: Self-pay | Admitting: Family Medicine

## 2016-08-12 ENCOUNTER — Other Ambulatory Visit: Payer: Self-pay | Admitting: Family Medicine

## 2016-08-15 ENCOUNTER — Other Ambulatory Visit: Payer: Self-pay | Admitting: Family Medicine

## 2016-08-31 ENCOUNTER — Other Ambulatory Visit: Payer: Self-pay | Admitting: Family Medicine

## 2016-09-05 ENCOUNTER — Other Ambulatory Visit: Payer: Self-pay | Admitting: Family Medicine

## 2016-09-06 ENCOUNTER — Other Ambulatory Visit: Payer: Self-pay | Admitting: Family Medicine

## 2016-10-02 ENCOUNTER — Ambulatory Visit (INDEPENDENT_AMBULATORY_CARE_PROVIDER_SITE_OTHER): Payer: 59 | Admitting: Family Medicine

## 2016-10-02 ENCOUNTER — Encounter: Payer: Self-pay | Admitting: Family Medicine

## 2016-10-02 VITALS — BP 158/84 | HR 84 | Temp 98.6°F | Resp 16 | Ht 65.0 in | Wt 170.0 lb

## 2016-10-02 DIAGNOSIS — D689 Coagulation defect, unspecified: Secondary | ICD-10-CM

## 2016-10-02 DIAGNOSIS — E790 Hyperuricemia without signs of inflammatory arthritis and tophaceous disease: Secondary | ICD-10-CM | POA: Insufficient documentation

## 2016-10-02 DIAGNOSIS — Z794 Long term (current) use of insulin: Secondary | ICD-10-CM

## 2016-10-02 DIAGNOSIS — E881 Lipodystrophy, not elsewhere classified: Secondary | ICD-10-CM

## 2016-10-02 DIAGNOSIS — Z23 Encounter for immunization: Secondary | ICD-10-CM

## 2016-10-02 DIAGNOSIS — Z125 Encounter for screening for malignant neoplasm of prostate: Secondary | ICD-10-CM

## 2016-10-02 DIAGNOSIS — I10 Essential (primary) hypertension: Secondary | ICD-10-CM

## 2016-10-02 DIAGNOSIS — E119 Type 2 diabetes mellitus without complications: Secondary | ICD-10-CM | POA: Diagnosis not present

## 2016-10-02 DIAGNOSIS — E559 Vitamin D deficiency, unspecified: Secondary | ICD-10-CM | POA: Diagnosis not present

## 2016-10-02 DIAGNOSIS — Z0000 Encounter for general adult medical examination without abnormal findings: Secondary | ICD-10-CM | POA: Diagnosis not present

## 2016-10-02 DIAGNOSIS — K7460 Unspecified cirrhosis of liver: Secondary | ICD-10-CM

## 2016-10-02 NOTE — Progress Notes (Signed)
Patient: [name], Male    DOB: 1951/03/26, 66 y.o.   MRN: [id number] Visit Date: 10/02/2016  Today's Provider: Lelon Huh, MD   Chief Complaint  Patient presents with  . Annual Exam   Subjective:    Annual Physical Exam [name] is a 66 y.o. male. He feels well. He reports exercising daily. Pt participates in gardening when the weather allows, as well as walks for 1/2 mile daily. He reports he is sleeping fairly well.  Last colonoscopy- 11/21/2015- Dr. Vira Agar. Tubular adenoma and internal hemorrhoids.  -----------------------------------------------------------    Diabetes Mellitus Type II, Follow-up:   Lab Results  Component Value Date   HGBA1C 7.5 04/19/2015   HGBA1C 6.4 (A) 09/27/2014   HGBA1C 11.3 (H) 09/07/2012    Last seen for diabetes over a year ago.  Management since then includes none. He reports excellent compliance with treatment. He is having side effects. Occasional diarrhea. Current symptoms include hypoglycemia  and polydipsia and have been stable. Home blood sugar records: fasting range: 105-150; has been as low as 75 and has been above 200  Episodes of hypoglycemia? yes - once every 2-3 weeks. Tremors and "feeling like I have lead weights all over my body".   Current Insulin Regimen: Novolog between 6-10 units daily, and Basaglar 16-20 units qhs..  Most Recent Eye Exam: November 2017. [facility] in [place]. Weight trend: decreasing steadily Prior visit with dietician: no Current diet: in general, a "healthy" diet   Current exercise: walking and yard work  Pertinent Labs:    Component Value Date/Time   CHOL 138 09/27/2014   CHOL 92 09/07/2012 0406   TRIG 86 09/27/2014   TRIG 116 09/07/2012 0406   HDL 67 09/27/2014   HDL 21 (L) 09/07/2012 0406   LDLCALC 54 09/27/2014   LDLCALC 48 09/07/2012 0406   CREATININE 0.79 03/04/2015 1645   CREATININE 0.78 04/27/2014 1452    Wt Readings from Last 3 Encounters:  10/02/16  170 lb (77.1 kg)  11/21/15 165 lb (74.8 kg)  05/03/15 173 lb 15.1 oz (78.9 kg)    ------------------------------------------------------------------------   Hypertension, follow-up:  BP Readings from Last 3 Encounters:  10/02/16 (!) 158/84  11/21/15 (!) 158/92  05/03/15 (!) 163/96    He reports excellent compliance with treatment. Pt is not taking Nadolol, which is on his med list. He is not having side effects.  He is exercising. He is not adherent to low salt diet.   He is experiencing fatigue.  Patient denies chest pain, chest pressure/discomfort, claudication, dyspnea, exertional chest pressure/discomfort, irregular heart beat, lower extremity edema, near-syncope, orthopnea, palpitations and syncope.   Cardiovascular risk factors include advanced age (older than 32 for men, 52 for women), diabetes mellitus, hypertension and male gender.    Weight trend: decreasing steadily Wt Readings from Last 3 Encounters:  10/02/16 170 lb (77.1 kg)  11/21/15 165 lb (74.8 kg)  05/03/15 173 lb 15.1 oz (78.9 kg)    Current diet: in general, a "healthy" diet    ------------------------------------------------------------------------ Follow up depression Continues on sertraline which he takes every day. He feels like his mood has been very good. Is tolerating medication well without adverse effects.   Follow up cirrhosis States he has mostly stopped drinking, he admits to drinking a beer every couple of weeks. Is not following up with GI anymore.   Review of Systems  Constitutional: Negative.   HENT: Positive for rhinorrhea. Negative for congestion, dental problem,  drooling, ear discharge, ear pain, facial swelling, hearing loss, mouth sores, nosebleeds, postnasal drip, sinus pain, sinus pressure, sneezing, sore throat, tinnitus, trouble swallowing and voice change.   Eyes: Negative.   Respiratory: Negative.   Cardiovascular: Negative.   Gastrointestinal: Positive for diarrhea.  Negative for abdominal distention, abdominal pain, anal bleeding, blood in stool, constipation, nausea, rectal pain and vomiting.  Endocrine: Negative.   Genitourinary: Negative.   Musculoskeletal: Positive for back pain, neck pain and neck stiffness. Negative for arthralgias, gait problem, joint swelling and myalgias.  Skin: Negative.   Allergic/Immunologic: Negative.   Neurological: Negative.   Hematological: Negative.   Psychiatric/Behavioral: Negative.     Social History   Social History  . Marital status: Married    Spouse name: N/A  . Number of children: N/A  . Years of education: N/A   Occupational History  . Not on file.   Social History Main Topics  . Smoking status: Never Smoker  . Smokeless tobacco: Never Used  . Alcohol use No     Comment: Former drinker  . Drug use: No  . Sexual activity: Yes   Other Topics Concern  . Not on file   Social History Narrative  . No narrative on file    Past Medical History:  Diagnosis Date  . Alcoholism ([place])   . Chronic pancreatitis ([place])   . 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 Diabetes mellitus without complication ([place])   . Elevated liver enzymes   . Esophageal varices ([place])   . Gastritis   . Hypertension   . Pancreatitis   . Pancreatitis      Patient Active Problem List   Diagnosis Date Noted  . Alcohol abuse 03/30/2015  . Dermatitis, eczematoid 03/20/2015  . GERD (gastroesophageal reflux disease) 03/20/2015  . History of Helicobacter pylori infection 03/20/2015  . Hearing loss of right ear 03/20/2015  . Hernia, inguinal, left 03/20/2015  . Supraspinatus sprain and strain 03/20/2015  . Cirrhosis ([place]) 03/20/2015  . Scalp laceration 02/28/2015  . Rib contusion 02/28/2015  . Splenic vein thrombosis   . History of portal hypertension   . Blood clotting disorder ([place]) 02/11/2014  . Odynophagia 02/10/2014  . Chronic pancreatitis ([place]) 12/08/2013  . Abnormal liver enzymes 12/08/2013  . Esophagitis  12/08/2013  . Gastric catarrh 12/08/2013  . H/O adenomatous polyp of colon 09/23/2012  . Esophageal varices ([place]) 09/09/2012  . Other and unspecified disc disorder of cervical region 08/17/2011  . Vitamin D deficiency 08/19/2009  . Diabetes ([place]) 10/01/2007  . 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 Depression 05/13/2007  . 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 Obesity 07/09/2005  . Hypertension 07/09/1998    Past Surgical History:  Procedure Laterality Date  . CHOLECYSTECTOMY  2006  . COLONOSCOPY WITH ESOPHAGOGASTRODUODENOSCOPY (EGD)    . COLONOSCOPY WITH PROPOFOL N/A 11/21/2015   Procedure: COLONOSCOPY WITH PROPOFOL;  Surgeon: Manya Silvas, MD;  Location: [facility] ENDOSCOPY;  Service: Endoscopy;  Laterality: N/A;  . HERNIA REPAIR     Inguinal Hernia Repai. Left: 07/28/2013 Dr. [name]; 2nd repair done 04/30/2014  . TONSILLECTOMY      His family history includes Colon polyps in his father; Congestive Heart Failure in his brother; Dementia in his mother; Diabetes in his brother; Heart Problems in his brother; Hypertension in his brother, brother, and father; Lung cancer in his father.      Current Outpatient Prescriptions:  .  allopurinol (ZYLOPRIM) 100 MG tablet, TAKE  1 TABLET DAILY, Disp: 10 tablet, Rfl: 3 .  Calcium Citrate 200 MG TABS, Take 200 mg by mouth daily. Reported on 11/21/2015, Disp: , Rfl:  .  insulin aspart (NOVOLOG FLEXPEN) 100 UNIT/ML FlexPen, Up to 10 units three times a day before meals, Disp: 15 mL, Rfl: 3 .  Insulin Glargine (BASAGLAR KWIKPEN) 100 UNIT/ML SOPN, INJECT 16 UNITS UNDER THE SKIN AT BEDTIME, Disp: 1 pen, Rfl: 1 .  Insulin Pen Needle (PEN NEEDLES) 31G X 5 MM MISC, 1 Device by Does not apply route 3 (three) times daily., Disp: 100 each, Rfl: 12 .  lisinopril (PRINIVIL,ZESTRIL) 10 MG tablet, TAKE 1 TABLET BY MOUTH EVERY DAY, Disp: 90 tablet, Rfl: 4 .  magnesium gluconate (MAGONATE) 500 MG tablet, Take 500 mg by  mouth daily. , Disp: , Rfl:  .  Multiple Vitamin (MULTIVITAMIN WITH MINERALS) TABS tablet, Take 1 tablet by mouth daily., Disp: , Rfl:  .  niacin (NIASPAN) 500 MG CR tablet, Take 500 mg by mouth daily. , Disp: , Rfl:  .  omeprazole (PRILOSEC) 40 MG capsule, TAKE 1 CAPSULE (40 MG TOTAL) BY MOUTH DAILY., Disp: 30 capsule, Rfl: 0 .  ONE TOUCH ULTRA TEST test strip, USE 1 STRIP AS DIRECTED 4 TIMES A DAY, Disp: 100 each, Rfl: 1 .  Potassium Gluconate 550 MG TABS, Take 550 mg by mouth daily. , Disp: , Rfl:  .  pyridOXINE (VITAMIN B-6) 50 MG tablet, Take 50 mg by mouth daily., Disp: , Rfl:  .  sertraline (ZOLOFT) 100 MG tablet, Take 1 tablet (100 mg total) by mouth daily., Disp: 15 tablet, Rfl: 0 .  zinc gluconate 50 MG tablet, Take 50 mg by mouth daily., Disp: , Rfl:   Patient Care Team: Birdie Sons, MD as PCP - General (Family Medicine) Lollie Sails, MD as Consulting Physician (Gastroenterology)     Objective:   Vitals: BP (!) 158/84 (BP Location: Left Arm, Patient Position: Sitting, Cuff Size: Normal)   Pulse 84   Temp 98.6 F (37 C) (Oral)   Resp 16   Ht 5\' 5"  (1.651 m)   Wt 170 lb (77.1 kg)   BMI 28.29 kg/m   Physical Exam   General Appearance:    Alert, cooperative, no distress, appears stated age  Head:    Normocephalic, without obvious abnormality, atraumatic  Eyes:    PERRL, conjunctiva/corneas clear, EOM's intact, fundi    benign, both eyes       Ears:    Normal TM's and external ear canals, both ears  Nose:   Nares normal, septum midline, mucosa normal, no drainage   or sinus tenderness  Throat:   Lips, mucosa, and tongue normal; teeth and gums normal  Neck:   Supple, symmetrical, trachea midline, no adenopathy;       thyroid:  No enlargement/tenderness/nodules; no carotid   bruit or JVD  Back:     Symmetric, no curvature, ROM normal, no CVA tenderness  Lungs:     Clear to auscultation bilaterally, respirations unlabored  Chest wall:    No tenderness or  deformity  Heart:    Regular rate and rhythm, S1 and S2 normal, no murmur, rub   or gallop  Abdomen:     Soft, non-tender, bowel sounds active all four quadrants,    no masses, no organomegaly  Genitalia:    deferred  Rectal:    deferred  Extremities:   Extremities normal, atraumatic, no cyanosis or edema  Pulses:   2+  and symmetric all extremities  Skin:   Skin color, texture, turgor normal, no rashes or lesions  Lymph nodes:   Cervical, supraclavicular, and axillary nodes normal  Neurologic:   CNII-XII intact. Normal strength, sensation and reflexes      throughout    Activities of Daily Living In your present state of health, do you have any difficulty performing the following activities: 10/02/2016  Hearing? N  Vision? N  Difficulty concentrating or making decisions? N  Walking or climbing stairs? N  Dressing or bathing? N  Doing errands, shopping? N  Some recent data might be hidden    Fall Risk Assessment Fall Risk  10/02/2016  Falls in the past year? No     Depression Screen PHQ 2/9 Scores 10/02/2016  PHQ - 2 Score 0  PHQ- 9 Score 3     Audit-C Alcohol Use Screening  Question Answer Points  How often do you have alcoholic drink? never 0  On days you do drink alcohol, how many drinks do you typically consume? N/A 0  How oftey will you drink 6 or more in a total? never 0  Total Score:  0   A score of 3 or more in women, and 4 or more in men indicates increased risk for alcohol abuse, EXCEPT if all of the points are from question 1.    Assessment & Plan:     Annual Wellness Visit  Reviewed patient's Family Medical History Reviewed and updated list of patient's medical providers Assessment of cognitive impairment was done Assessed patient's functional ability Established a written schedule for health screening [place] Completed and Reviewed  Exercise Activities and Dietary recommendations Goals    None      Immunization  History  Administered Date(s) Administered  . Influenza,inj,Quad PF,36+ Mos 04/11/2015  . Pneumococcal Polysaccharide-23 08/06/2011  . Tdap 06/05/2011  . Zoster 01/04/2011    Health Maintenance  Topic Date Due  . FOOT EXAM  09/25/1960  . PNA vac Low Risk Adult (1 of 2 - PCV13) 09/26/2015  . HEMOGLOBIN A1C  10/18/2015  . OPHTHALMOLOGY EXAM  06/15/2016  . COLONOSCOPY  11/20/2020  . TETANUS/TDAP  06/04/2021  . INFLUENZA VACCINE  Addressed  . Hepatitis C Screening  Completed     Discussed health benefits of physical activity, and encouraged him to engage in regular exercise appropriate for his age and condition.    ------------------------------------------------------------------------------------------------------------ 1. Annual physical exam   2. Essential hypertension BP elevated today. See how labs look, may need to increase ACEI.  - EKG 12-Lead  3. Cirrhosis of liver without ascites, unspecified hepatic cirrhosis type (HCC) Currently asymptoamatic.  - CBC  4. Lipodystrophy  - Comprehensive metabolic panel - Lipid panel  5. Blood clotting disorder ([place])   6. Vitamin D deficiency  - VITAMIN D 25 Hydroxy (Vit-D Deficiency, Fractures)  7. Type 2 diabetes mellitus without complication, with long-term current use of insulin ([place] Tolerating current medications well.  - Hemoglobin A1c  8. Hyperuricemia  - Uric acid  9. Need for pneumococcal vaccination  - Pneumococcal conjugate vaccine 13-valent IM  10. Prostate cancer screening  - PSA   Patient seen and examined by Lelon Huh, MD, and note scribed by Renaldo Fiddler, CMA.  Lelon Huh, MD  [facility]

## 2016-10-03 ENCOUNTER — Telehealth: Payer: Self-pay | Admitting: *Deleted

## 2016-10-03 DIAGNOSIS — R945 Abnormal results of liver function studies: Principal | ICD-10-CM

## 2016-10-03 DIAGNOSIS — R7989 Other specified abnormal findings of blood chemistry: Secondary | ICD-10-CM

## 2016-10-03 LAB — COMPREHENSIVE METABOLIC PANEL
ALBUMIN: 4.5 g/dL (ref 3.6–4.8)
ALT: 84 IU/L — ABNORMAL HIGH (ref 0–44)
AST: 102 IU/L — ABNORMAL HIGH (ref 0–40)
Albumin/Globulin Ratio: 1.7 (ref 1.2–2.2)
Alkaline Phosphatase: 147 IU/L — ABNORMAL HIGH (ref 39–117)
BUN / CREAT RATIO: 16 (ref 10–24)
BUN: 10 mg/dL (ref 8–27)
Bilirubin Total: 1.2 mg/dL (ref 0.0–1.2)
CALCIUM: 9.5 mg/dL (ref 8.6–10.2)
CO2: 26 mmol/L (ref 18–29)
CREATININE: 0.64 mg/dL — AB (ref 0.76–1.27)
Chloride: 98 mmol/L (ref 96–106)
GFR calc Af Amer: 118 mL/min/{1.73_m2} (ref >59)
GFR, EST NON AFRICAN AMERICAN: 102 mL/min/{1.73_m2} (ref >59)
GLOBULIN, TOTAL: 2.6 g/dL (ref 1.5–4.5)
Glucose: 182 mg/dL — ABNORMAL HIGH (ref 65–99)
Potassium: 5.3 mmol/L — ABNORMAL HIGH (ref 3.5–5.2)
SODIUM: 140 mmol/L (ref 134–144)
Total Protein: 7.1 g/dL (ref 6.0–8.5)

## 2016-10-03 LAB — PSA: Prostate Specific Ag, Serum: 0.6 ng/mL (ref 0.0–4.0)

## 2016-10-03 LAB — LIPID PANEL
CHOL/HDL RATIO: 1.9 ratio (ref 0.0–5.0)
CHOLESTEROL TOTAL: 126 mg/dL (ref 100–199)
HDL: 65 mg/dL (ref >39)
LDL CALC: 52 mg/dL (ref 0–99)
TRIGLYCERIDES: 43 mg/dL (ref 0–149)
VLDL CHOLESTEROL CAL: 9 mg/dL (ref 5–40)

## 2016-10-03 LAB — CBC
HEMATOCRIT: 42.4 % (ref 37.5–51.0)
HEMOGLOBIN: 14.4 g/dL (ref 13.0–17.7)
MCH: 33.4 pg — ABNORMAL HIGH (ref 26.6–33.0)
MCHC: 34 g/dL (ref 31.5–35.7)
MCV: 98 fL — AB (ref 79–97)
Platelets: 137 10*3/uL — ABNORMAL LOW (ref 150–379)
RBC: 4.31 x10E6/uL (ref 4.14–5.80)
RDW: 14.3 % (ref 12.3–15.4)
WBC: 5.1 10*3/uL (ref 3.4–10.8)

## 2016-10-03 LAB — URIC ACID: Uric Acid: 2.6 mg/dL — ABNORMAL LOW (ref 3.7–8.6)

## 2016-10-03 LAB — HEMOGLOBIN A1C
ESTIMATED AVERAGE GLUCOSE: 157 mg/dL
Hgb A1c MFr Bld: 7.1 % — ABNORMAL HIGH (ref 4.8–5.6)

## 2016-10-03 LAB — VITAMIN D 25 HYDROXY (VIT D DEFICIENCY, FRACTURES): Vit D, 25-Hydroxy: 14.6 ng/mL — ABNORMAL LOW (ref 30.0–100.0)

## 2016-10-03 NOTE — Telephone Encounter (Signed)
-----   Message from Birdie Sons, MD sent at 10/03/2016  7:55 AM EDT ----- Liver functions are elevated, need right upper quadrant ultrasound. Also please see if [facility] can add alpha-fetoprotein levels.  hga1c is stable at 7.1 rest of labs are normal. Vitamin d level is low. Need to take 1000 units vitamin D3 every day.

## 2016-10-03 NOTE — Telephone Encounter (Signed)
Patient was notified of results. Expressed understanding. Lab added to order. Confirmation is being faxed.

## 2016-10-03 NOTE — Telephone Encounter (Signed)
Please schedule [place] RUQ. Thanks!

## 2016-10-05 ENCOUNTER — Other Ambulatory Visit: Payer: Self-pay | Admitting: Family Medicine

## 2016-10-06 ENCOUNTER — Other Ambulatory Visit: Payer: Self-pay | Admitting: Family Medicine

## 2016-10-08 ENCOUNTER — Other Ambulatory Visit: Payer: Self-pay | Admitting: Family Medicine

## 2016-10-08 ENCOUNTER — Ambulatory Visit
Admission: RE | Admit: 2016-10-08 | Discharge: 2016-10-08 | Disposition: A | Discharge status: Home / Self Care | Payer: Managed Care, Other (non HMO) | Source: Ambulatory Visit | Attending: Family Medicine | Admitting: Family Medicine

## 2016-10-08 DIAGNOSIS — E119 Type 2 diabetes mellitus without complications: Secondary | ICD-10-CM

## 2016-10-08 DIAGNOSIS — R7989 Other specified abnormal findings of blood chemistry: Secondary | ICD-10-CM | POA: Diagnosis present

## 2016-10-08 DIAGNOSIS — Z9049 Acquired absence of other specified parts of digestive tract: Secondary | ICD-10-CM | POA: Insufficient documentation

## 2016-10-08 DIAGNOSIS — R945 Abnormal results of liver function studies: Secondary | ICD-10-CM

## 2016-10-08 DIAGNOSIS — R932 Abnormal findings on diagnostic imaging of liver and biliary tract: Secondary | ICD-10-CM | POA: Diagnosis not present

## 2016-10-08 DIAGNOSIS — K219 Gastro-esophageal reflux disease without esophagitis: Secondary | ICD-10-CM

## 2016-10-08 DIAGNOSIS — I10 Essential (primary) hypertension: Secondary | ICD-10-CM

## 2016-10-08 LAB — AFP TUMOR MARKER: AFP-Tumor Marker: 3.2 ng/mL (ref 0.0–8.3)

## 2016-10-08 LAB — SPECIMEN STATUS REPORT

## 2016-10-08 MED ORDER — OMEPRAZOLE 40 MG PO CPDR: 40.0000 mg | DELAYED_RELEASE_CAPSULE | Freq: Every day | ORAL | 0 refills | Status: DC

## 2016-10-08 MED ORDER — PEN NEEDLES 31G X 5 MM MISC: 1.0000 | Freq: Three times a day (TID) | 0 refills | Status: DC

## 2016-10-08 MED ORDER — LISINOPRIL 10 MG PO TABS: 10.0000 mg | ORAL_TABLET | Freq: Every day | ORAL | 0 refills | Status: DC

## 2016-10-08 MED ORDER — BASAGLAR KWIKPEN 100 UNIT/ML ~~LOC~~ SOPN: PEN_INJECTOR | SUBCUTANEOUS | 1 refills | Status: DC

## 2016-10-08 NOTE — Telephone Encounter (Signed)
Pt needs refill on   lisinopril (PRINIVIL,ZESTRIL) 10 MG tablet   Insulin Glargine (BASAGLAR KWIKPEN) 100 UNIT/ML SOPN  Taking 08/31/16 -- Birdie Sons, MD   INJECT 16 UNITS UNDER THE SKIN AT BEDTIME     Insulin Pen Needle (PEN NEEDLES) 31G X 5 MM MISC  Taking 08/31/15 -- Birdie Sons, MD   1 Device by Does not apply route 3 (three) times daily.      Insulin Pen Needle (PEN NEEDLES) 31G X 5 MM MISC  Taking 08/31/15 -- Birdie Sons, MD   1 Device by Does not apply route 3 (three) times daily.   omeprazole (PRILOSEC) 40 MG capsule  [facility] uses [facility] [place]  Hwe said these were first requested last week.  He is going out of town tomorrow.  Thank sTeri

## 2016-10-08 NOTE — Telephone Encounter (Signed)
Refill request for omeprazole 40 mg qd Last filled by MD on- 07/13/16 #30 x0  Refill request for [employer] Last filled by MD on- 08/31/16 #1 x1 refill  Refill request for pen needles Last filled by MD on- 08/31/2015 #100 x12 refills  Last Appt: 10/02/2016 Next Appt: none Please advise refill?

## 2016-10-18 ENCOUNTER — Other Ambulatory Visit: Payer: Self-pay

## 2016-10-18 DIAGNOSIS — E119 Type 2 diabetes mellitus without complications: Secondary | ICD-10-CM

## 2016-10-18 MED ORDER — INSULIN ASPART 100 UNIT/ML FLEXPEN: PEN_INJECTOR | SUBCUTANEOUS | 3 refills | Status: DC

## 2016-10-18 MED ORDER — BASAGLAR KWIKPEN 100 UNIT/ML ~~LOC~~ SOPN: PEN_INJECTOR | SUBCUTANEOUS | 2 refills | Status: DC

## 2016-10-18 MED ORDER — GLUCOSE BLOOD VI STRP: ORAL_STRIP | 2 refills | Status: DC

## 2016-10-18 NOTE — Telephone Encounter (Signed)
Patient needs these prescriptions sent to his mail order [facility]. He states the test strips were sent [facility] the local [facility] [place] for only a qty of 100 and he needs a 3 month supply. Patient states that would have cost him $120.

## 2016-11-04 ENCOUNTER — Other Ambulatory Visit: Payer: Self-pay | Admitting: Physician Assistant

## 2016-11-04 DIAGNOSIS — K219 Gastro-esophageal reflux disease without esophagitis: Secondary | ICD-10-CM

## 2016-11-05 NOTE — Telephone Encounter (Signed)
This was sent to Jenni's refill request box. Renaldo Fiddler, CMA

## 2017-04-02 ENCOUNTER — Other Ambulatory Visit: Payer: Self-pay | Admitting: Family Medicine

## 2017-04-25 ENCOUNTER — Other Ambulatory Visit: Payer: Self-pay | Admitting: Family Medicine

## 2017-04-25 NOTE — Telephone Encounter (Signed)
Overdue for follow up. Need to schedule appointment before refill can be approved.

## 2017-04-26 NOTE — Telephone Encounter (Signed)
appt made

## 2017-04-29 ENCOUNTER — Other Ambulatory Visit: Payer: Self-pay | Admitting: Family Medicine

## 2017-04-29 NOTE — Progress Notes (Deleted)
Patient: [name] Male    DOB: January 28, 1951   66 y.o.   MRN: [id number] Visit Date: 04/29/2017  Today's Provider: Lelon Huh, MD   No chief complaint on file.  Subjective:    HPI   Diabetes Mellitus Type II, Follow-up:   Lab Results  Component Value Date   HGBA1C 7.1 (H) 10/02/2016   HGBA1C 7.5 04/19/2015   HGBA1C 6.4 (A) 09/27/2014   Last seen for diabetes 7 months ago.  Management since then includes; labs checked, no changes. He reports {excellent/good/fair/poor:19665} compliance with treatment. He {ACTION; IS/IS STM:[id number]} having side effects. *** Current symptoms include {Symptoms; diabetes:14075} and have been {Desc; course:15616}. Home blood sugar records: {diabetes glucometry results:16657}  Episodes of hypoglycemia? {yes***/no:17258}   Current Insulin Regimen: *** Most Recent Eye Exam: *** Weight trend: {trend:16658} Prior visit with dietician: {yes/no:17258} Current diet: {diet habits:16563} Current exercise: {exercise types:16438}  ------------------------------------------------------------------------   Hypertension, follow-up:  BP Readings from Last 3 Encounters:  10/02/16 (!) 158/84  11/21/15 (!) 158/92  05/03/15 (!) 163/96    He was last seen for hypertension 7 months ago.  BP at that visit was 158/84. Management since that visit includes; labs checked, no changes.He reports {excellent/good/fair/poor:19665} compliance with treatment. He {ACTION; IS/IS LGX:[id number]} having side effects. *** He {is/is not:9024} exercising. He {is/is not:9024} adherent to low salt diet.   Outside blood pressures are ***. He is experiencing {Symptoms; cardiac:12860}.  Patient denies {Symptoms; cardiac:12860}.   Cardiovascular risk factors include {cv risk factors:510}.  Use of agents associated with hypertension: {bp agents assoc with hypertension:511::"none"}.   ------------------------------------------------------------------------  Vitamin  D deficiency From 10/02/2016-labs checked, level was low. Recommended he take Vitamin D3 1,000 units qd.  Cirrhosis of liver without ascites, unspecified hepatic cirrhosis type ([place]) From 10/02/2016-labs checked, showing elevated liver functions. [place] ordered, showing fatty liver, possible cirrhosis. No sign of tumors or liver caner. Need to repeat yearly.   Allergies  Allergen Reactions  . Hydrochlorothiazide Other (See Comments)    Reaction:  Hyponatremia     Current Outpatient Prescriptions:  .  allopurinol (ZYLOPRIM) 100 MG tablet, TAKE 1 TABLET DAILY, Disp: 10 tablet, Rfl: 3 .  Calcium Citrate 200 MG TABS, Take 200 mg by mouth daily. Reported on 11/21/2015, Disp: , Rfl:  .  glucose blood (ONE TOUCH ULTRA TEST) test strip, Use as instructed to check blood sugar three times daily, Disp: 360 each, Rfl: 2 .  insulin aspart (NOVOLOG FLEXPEN) 100 UNIT/ML FlexPen, Up to up to 10 units three times a day before meals, Disp: 15 mL, Rfl: 3 .  Insulin Glargine (BASAGLAR KWIKPEN) 100 UNIT/ML SOPN, INJECT UP TO 20 UNITS UNDER THE SKIN AT BEDTIME, Disp: 6 pen, Rfl: 2 .  Insulin Pen Needle (PEN NEEDLES) 31G X 5 MM MISC, 1 Device by Does not apply route 3 (three) times daily., Disp: 100 each, Rfl: 0 .  lisinopril (PRINIVIL,ZESTRIL) 10 MG tablet, TAKE 1 TABLET BY MOUTH EVERY DAY, Disp: 30 tablet, Rfl: 0 .  magnesium gluconate (MAGONATE) 500 MG tablet, Take 500 mg by mouth daily. , Disp: , Rfl:  .  Multiple Vitamin (MULTIVITAMIN WITH MINERALS) TABS tablet, Take 1 tablet by mouth daily., Disp: , Rfl:  .  niacin (NIASPAN) 500 MG CR tablet, Take 500 mg by mouth daily. , Disp: , Rfl:  .  omeprazole (PRILOSEC) 40 MG capsule, TAKE 1 CAPSULE (40 MG TOTAL) BY MOUTH DAILY., Disp: 30 capsule, Rfl: 5 .  Potassium Gluconate 550 MG  TABS, Take 550 mg by mouth daily. , Disp: , Rfl:  .  pyridOXINE (VITAMIN B-6) 50 MG tablet, Take 50 mg by mouth daily., Disp: , Rfl:  .  sertraline (ZOLOFT) 100 MG tablet, Take 1 tablet (100  mg total) by mouth daily., Disp: 15 tablet, Rfl: 0 .  zinc gluconate 50 MG tablet, Take 50 mg by mouth daily., Disp: , Rfl:   Review of Systems  Constitutional: Negative for appetite change, chills and fever.  Respiratory: Negative for chest tightness, shortness of breath and wheezing.   Cardiovascular: Negative for chest pain and palpitations.  Gastrointestinal: Negative for abdominal pain, nausea and vomiting.    Social History  Substance Use Topics  . Smoking status: Never Smoker  . Smokeless tobacco: Never Used  . Alcohol use No     Comment: Former drinker   Objective:   There were no vitals taken for this visit. There were no vitals filed for this visit.   Physical Exam      Assessment & Plan:           Lelon Huh, MD  [facility]

## 2017-04-30 ENCOUNTER — Ambulatory Visit: Payer: Self-pay | Admitting: Family Medicine

## 2017-06-07 ENCOUNTER — Encounter: Payer: Self-pay | Admitting: Family Medicine

## 2017-06-07 ENCOUNTER — Ambulatory Visit (INDEPENDENT_AMBULATORY_CARE_PROVIDER_SITE_OTHER): Payer: 59 | Admitting: Family Medicine

## 2017-06-07 VITALS — BP 122/70 | HR 92 | Temp 98.3°F | Resp 18 | Wt 168.0 lb

## 2017-06-07 DIAGNOSIS — J069 Acute upper respiratory infection, unspecified: Secondary | ICD-10-CM

## 2017-06-07 DIAGNOSIS — Z794 Long term (current) use of insulin: Secondary | ICD-10-CM

## 2017-06-07 DIAGNOSIS — E119 Type 2 diabetes mellitus without complications: Secondary | ICD-10-CM | POA: Diagnosis not present

## 2017-06-07 DIAGNOSIS — I10 Essential (primary) hypertension: Secondary | ICD-10-CM

## 2017-06-07 DIAGNOSIS — E881 Lipodystrophy, not elsewhere classified: Secondary | ICD-10-CM | POA: Diagnosis not present

## 2017-06-07 DIAGNOSIS — F1010 Alcohol abuse, uncomplicated: Secondary | ICD-10-CM

## 2017-06-07 MED ORDER — AZITHROMYCIN 250 MG PO TABS: ORAL_TABLET | ORAL | 0 refills | Status: AC

## 2017-06-07 MED ORDER — NALTREXONE HCL 50 MG PO TABS: 50.0000 mg | ORAL_TABLET | Freq: Every day | ORAL | 1 refills | Status: DC

## 2017-06-07 NOTE — Progress Notes (Signed)
Patient: [name] Male    DOB: Nov 09, 1950   66 y.o.   MRN: [id number] Visit Date: 06/07/2017  Today's Provider: Lelon Huh, MD   Chief Complaint  Patient presents with  . Cough   Subjective:    HPI Pt presents today for cough and congestion. He reports that it has been going on for about 4 week now. He reports that it started out with what he thinks was a low grade fever. He reports that he has had a productive cough with yellowish/green sputum. He reports that he gets to coughing so bad that he sometimes can not catch his breath. He reports that he has a headache today.   He is also her to follow up alcohol abuse. He states he was discharged from [facility] about a a week ago and has completely abstained from alcohol consumption.   He is also here to follow up on diabetes, hypertension, and hyperlipidemia. Is tolerating medications well, but blood sugar has been running in the upper 100s. He has been titrating basal insulin and is now taking 28 units daily. No hypoglycmia.      Allergies  Allergen Reactions  . Hydrochlorothiazide Other (See Comments)    Reaction:  Hyponatremia     Current Outpatient Medications:  .  allopurinol (ZYLOPRIM) 100 MG tablet, TAKE 1 TABLET DAILY, Disp: 10 tablet, Rfl: 3 .  Calcium Citrate 200 MG TABS, Take 200 mg by mouth daily. Reported on 11/21/2015, Disp: , Rfl:  .  glucose blood (ONE TOUCH ULTRA TEST) test strip, Use as instructed to check blood sugar three times daily, Disp: 360 each, Rfl: 2 .  insulin aspart (NOVOLOG FLEXPEN) 100 UNIT/ML FlexPen, Up to up to 10 units three times a day before meals, Disp: 15 mL, Rfl: 3 .  Insulin Glargine (BASAGLAR KWIKPEN) 100 UNIT/ML SOPN, INJECT UP TO 20 UNITS UNDER THE SKIN AT BEDTIME, Disp: 6 pen, Rfl: 2 .  Insulin Pen Needle (PEN NEEDLES) 31G X 5 MM MISC, 1 Device by Does not apply route 3 (three) times daily., Disp: 100 each, Rfl: 0 .  lisinopril (PRINIVIL,ZESTRIL) 10 MG tablet, TAKE 1  TABLET BY MOUTH EVERY DAY, Disp: 30 tablet, Rfl: 0 .  magnesium gluconate (MAGONATE) 500 MG tablet, Take 500 mg by mouth daily. , Disp: , Rfl:  .  Multiple Vitamin (MULTIVITAMIN WITH MINERALS) TABS tablet, Take 1 tablet by mouth daily., Disp: , Rfl:  .  niacin (NIASPAN) 500 MG CR tablet, Take 500 mg by mouth daily. , Disp: , Rfl:  .  omeprazole (PRILOSEC) 40 MG capsule, TAKE 1 CAPSULE (40 MG TOTAL) BY MOUTH DAILY., Disp: 30 capsule, Rfl: 5 .  Potassium Gluconate 550 MG TABS, Take 550 mg by mouth daily. , Disp: , Rfl:  .  pyridOXINE (VITAMIN B-6) 50 MG tablet, Take 50 mg by mouth daily., Disp: , Rfl:  .  sertraline (ZOLOFT) 100 MG tablet, Take 1 tablet (100 mg total) by mouth daily., Disp: 15 tablet, Rfl: 0 .  traZODone (DESYREL) 50 MG tablet, Take 50 mg by mouth at bedtime., Disp: , Rfl:  .  zinc gluconate 50 MG tablet, Take 50 mg by mouth daily., Disp: , Rfl:   Review of Systems  Constitutional: Positive for fatigue.  HENT: Positive for congestion, ear pain, rhinorrhea and sore throat.   Eyes: Negative.   Respiratory: Positive for cough, chest tightness and shortness of breath.   Cardiovascular: Negative.   Gastrointestinal: Negative.  Endocrine: Negative.   Genitourinary: Negative.   Musculoskeletal: Negative.   Skin: Negative.   Allergic/Immunologic: Negative.   Neurological: Positive for headaches.  Hematological: Negative.   Psychiatric/Behavioral: Negative.     Social History   Tobacco Use  . Smoking status: Never Smoker  . Smokeless tobacco: Never Used  Substance Use Topics  . Alcohol use: No    Alcohol/week: 0.0 oz    Comment: Former drinker   Objective:   BP 122/70 (BP Location: Left Arm, Patient Position: Sitting, Cuff Size: Normal)   Pulse 92   Temp 98.3 F (36.8 C) (Oral)   Resp 18   Wt 168 lb (76.2 kg)   SpO2 99%   BMI 27.96 kg/m  Vitals:   06/07/17 1544  BP: 122/70  Pulse: 92  Resp: 18  Temp: 98.3 F (36.8 C)  TempSrc: Oral  SpO2: 99%  Weight:  168 lb (76.2 kg)     Physical Exam   General Appearance:    Alert, cooperative, no distress  Eyes:    PERRL, conjunctiva/corneas clear, EOM's intact       Lungs:     Clear to auscultation bilaterally, respirations unlabored  Heart:    Regular rate and rhythm  Neurologic:   Awake, alert, oriented x 3. No apparent focal neurological           defect.           Assessment & Plan:     1. Alcohol abuse Extensively discussed options to help aid alcohol abstinence. He is interested in trying medication  - naltrexone (DEPADE) 50 MG tablet; Take 1 tablet (50 mg total) by mouth daily.  Dispense: 90 tablet; Refill: 1  2. Upper respiratory tract infection, unspecified type  - azithromycin (ZITHROMAX) 250 MG tablet; 2 by mouth today, then 1 daily for 4 days  Dispense: 6 tablet; Refill: 0  3. Essential hypertension Well controlled.  Continue current medications.    4. Type 2 diabetes mellitus without complication, with long-term current use of insulin ([place]) Due for labs.  - Hemoglobin A1c - Ambulatory referral to diabetic education  5. Lipodystrophy  - Lipid panel - COMPLETE METABOLIC PANEL WITH GFR - CBC  Addressed extensive list of chronic and acute medical problems today requiring extensive time in counseling and coordination of care.  Over half of this 45 minute visit were spent in counseling and coordinating care of multiple medical problems.       Lelon Huh, MD  [facility]

## 2017-06-11 LAB — COMPLETE METABOLIC PANEL WITH GFR
AG RATIO: 1.2 (calc) (ref 1.0–2.5)
ALT: 18 U/L (ref 9–46)
AST: 23 U/L (ref 10–35)
Albumin: 3.8 g/dL (ref 3.6–5.1)
Alkaline phosphatase (APISO): 186 U/L — ABNORMAL HIGH (ref 40–115)
BUN: 10 mg/dL (ref 7–25)
CALCIUM: 9.4 mg/dL (ref 8.6–10.3)
CO2: 31 mmol/L (ref 20–32)
Chloride: 97 mmol/L — ABNORMAL LOW (ref 98–110)
Creat: 0.81 mg/dL (ref 0.70–1.25)
GFR, EST AFRICAN AMERICAN: 107 mL/min/{1.73_m2} (ref >=60)
GFR, EST NON AFRICAN AMERICAN: 93 mL/min/{1.73_m2} (ref >=60)
GLOBULIN: 3.2 g/dL (ref 1.9–3.7)
Glucose, Bld: 191 mg/dL — ABNORMAL HIGH (ref 65–99)
POTASSIUM: 4.6 mmol/L (ref 3.5–5.3)
SODIUM: 135 mmol/L (ref 135–146)
TOTAL PROTEIN: 7 g/dL (ref 6.1–8.1)
Total Bilirubin: 0.7 mg/dL (ref 0.2–1.2)

## 2017-06-11 LAB — CBC
HCT: 43.7 % (ref 38.5–50.0)
Hemoglobin: 15.1 g/dL (ref 13.2–17.1)
MCH: 31.6 pg (ref 27.0–33.0)
MCHC: 34.6 g/dL (ref 32.0–36.0)
MCV: 91.4 fL (ref 80.0–100.0)
MPV: 10.2 fL (ref 7.5–12.5)
PLATELETS: 296 10*3/uL (ref 140–400)
RBC: 4.78 10*6/uL (ref 4.20–5.80)
RDW: 12.5 % (ref 11.0–15.0)
WBC: 8.6 10*3/uL (ref 3.8–10.8)

## 2017-06-11 LAB — LIPID PANEL
CHOLESTEROL: 120 mg/dL (ref <200)
HDL: 38 mg/dL — AB (ref >40)
LDL Cholesterol (Calc): 67 mg/dL (calc)
Non-HDL Cholesterol (Calc): 82 mg/dL (calc) (ref <130)
Total CHOL/HDL Ratio: 3.2 (calc) (ref <5.0)
Triglycerides: 71 mg/dL (ref <150)

## 2017-06-11 LAB — HEMOGLOBIN A1C
Hgb A1c MFr Bld: 8.6 % of total Hgb — ABNORMAL HIGH (ref <5.7)
Mean Plasma Glucose: 200 (calc)
eAG (mmol/L): 11.1 (calc)

## 2017-06-20 ENCOUNTER — Telehealth: Payer: Self-pay

## 2017-06-20 DIAGNOSIS — E119 Type 2 diabetes mellitus without complications: Secondary | ICD-10-CM

## 2017-06-20 MED ORDER — METFORMIN HCL ER 500 MG PO TB24: 500.0000 mg | ORAL_TABLET | Freq: Every day | ORAL | 0 refills | Status: DC

## 2017-06-20 NOTE — Telephone Encounter (Signed)
-----   Message from Birdie Sons, MD sent at 06/11/2017  7:48 AM EST ----- A1c a little high at 8.6. Need to start metformin ER 500mg  once a day, #30, rf x 3. Rest of labs are good. Follow up in march as scheduled.

## 2017-06-20 NOTE — Telephone Encounter (Signed)
Advised patient as below. Medication was sent into mail order pharmacy per patient's request.

## 2017-06-24 ENCOUNTER — Other Ambulatory Visit: Payer: Self-pay | Admitting: Family Medicine

## 2017-06-24 MED ORDER — LISINOPRIL 10 MG PO TABS: 10.0000 mg | ORAL_TABLET | Freq: Every day | ORAL | 2 refills | Status: DC

## 2017-06-24 NOTE — Telephone Encounter (Signed)
Please review. Pharmacy is requesting a 90 day supply. Thanks!

## 2017-06-24 NOTE — Telephone Encounter (Signed)
[name]  faxed refill request for the following medications:  lisinopril (PRINIVIL,ZESTRIL) 10 MG tablet   90 day supply  Last Rx: 04/29/17 LOV: 06/07/17 Please advise. Thanks TNP

## 2017-09-27 ENCOUNTER — Other Ambulatory Visit: Payer: Self-pay | Admitting: Family Medicine

## 2017-09-27 DIAGNOSIS — E119 Type 2 diabetes mellitus without complications: Secondary | ICD-10-CM

## 2017-10-02 ENCOUNTER — Other Ambulatory Visit: Payer: Self-pay | Admitting: Family Medicine

## 2017-10-02 NOTE — Telephone Encounter (Signed)
[facility] faxed a refill request for the following prescription.   glucose blood (ONE TOUCH ULTRA TEST) test strip

## 2017-10-02 NOTE — Telephone Encounter (Signed)
Please review. Thanks!  

## 2017-10-03 ENCOUNTER — Encounter: Payer: Self-pay | Admitting: Family Medicine

## 2017-10-03 ENCOUNTER — Ambulatory Visit (INDEPENDENT_AMBULATORY_CARE_PROVIDER_SITE_OTHER): Payer: 59 | Admitting: Family Medicine

## 2017-10-03 VITALS — BP 128/72 | HR 68 | Temp 98.6°F | Resp 16 | Ht 65.0 in | Wt 173.0 lb

## 2017-10-03 DIAGNOSIS — Z794 Long term (current) use of insulin: Secondary | ICD-10-CM

## 2017-10-03 DIAGNOSIS — Z0000 Encounter for general adult medical examination without abnormal findings: Secondary | ICD-10-CM

## 2017-10-03 DIAGNOSIS — M5030 Other cervical disc degeneration, unspecified cervical region: Secondary | ICD-10-CM

## 2017-10-03 DIAGNOSIS — E881 Lipodystrophy, not elsewhere classified: Secondary | ICD-10-CM | POA: Diagnosis not present

## 2017-10-03 DIAGNOSIS — I10 Essential (primary) hypertension: Secondary | ICD-10-CM | POA: Diagnosis not present

## 2017-10-03 DIAGNOSIS — F1011 Alcohol abuse, in remission: Secondary | ICD-10-CM

## 2017-10-03 DIAGNOSIS — E119 Type 2 diabetes mellitus without complications: Secondary | ICD-10-CM | POA: Diagnosis not present

## 2017-10-03 DIAGNOSIS — Z23 Encounter for immunization: Secondary | ICD-10-CM | POA: Diagnosis not present

## 2017-10-03 DIAGNOSIS — Z125 Encounter for screening for malignant neoplasm of prostate: Secondary | ICD-10-CM | POA: Diagnosis not present

## 2017-10-03 MED ORDER — GABAPENTIN 100 MG PO CAPS: ORAL_CAPSULE | ORAL | 3 refills | Status: DC

## 2017-10-03 MED ORDER — GLUCOSE BLOOD VI STRP: ORAL_STRIP | 4 refills | Status: DC

## 2017-10-03 NOTE — Progress Notes (Signed)
Patient: [name], Male    DOB: 1950/12/26, 67 y.o.   MRN: [id number] Visit Date: 10/03/2017  Today's Provider: Lelon Huh, MD   Chief Complaint  Patient presents with  . Annual Exam  . Hypertension  . Diabetes  . Cirrhosis   Subjective:    Annual physical exam [name] is a 67 y.o. male who presents today for health maintenance and complete physical. He feels well. He reports exercising not regularly. He reports he is sleeping fairly well.  Colonoscopy- 11/21/2015. Internal hemorrhoids.    Hypertension, follow-up:  BP Readings from Last 3 Encounters:  10/03/17 128/72  06/07/17 122/70  10/02/16 (!) 158/84    He was last seen for hypertension 5 months ago.  BP at that visit was 122/70. Management since that visit includes no changes. He reports good compliance with treatment. He is not having side effects.  He is not exercising. He is adherent to low salt diet.   Outside blood pressures are checked occasionally. He is experiencing none.  Patient denies exertional chest pressure/discomfort, lower extremity edema and palpitations.   Cardiovascular risk factors include diabetes mellitus and dyslipidemia.  Use of agents associated with hypertension: none.     Weight trend: stable Wt Readings from Last 3 Encounters:  10/03/17 173 lb (78.5 kg)  06/07/17 168 lb (76.2 kg)  10/02/16 170 lb (77.1 kg)    Current diet: well balanced   Diabetes Mellitus Type II, Follow-up:   Lab Results  Component Value Date   HGBA1C 8.6 (H) 06/10/2017   HGBA1C 7.1 (H) 10/02/2016   HGBA1C 7.5 04/19/2015    Last seen for diabetes 5 months ago.  Management since then includes starting Metformin ER 500mg  daily.  He reports good compliance with treatment. He is not having side effects.  Current symptoms include none and have been stable. Home blood sugar records: stable.   Episodes of hypoglycemia? no   Most Recent Eye Exam: due this month.  Weight trend:  stable Prior visit with dietician: no Current diet: well balanced Current exercise: no regular exercise  Pertinent Labs:    Component Value Date/Time   CHOL 120 06/10/2017 1110   CHOL 126 10/02/2016 1042   CHOL 92 09/07/2012 0406   TRIG 71 06/10/2017 1110   TRIG 116 09/07/2012 0406   HDL 38 (L) 06/10/2017 1110   HDL 65 10/02/2016 1042   HDL 21 (L) 09/07/2012 0406   LDLCALC 67 06/10/2017 1110   LDLCALC 48 09/07/2012 0406   CREATININE 0.81 06/10/2017 1110    Alcohol abuse- from 06/07/2017. Started Naltrexone to help aid in alcohol abstinence. He states he has not had an alcohol since October. He feels well, Has no complaints today.    Review of Systems  Constitutional: Negative.   HENT: Negative.   Eyes: Negative.   Respiratory: Negative.   Cardiovascular: Negative.   Gastrointestinal: Negative.   Endocrine: Negative.   Genitourinary: Negative.   Musculoskeletal: Positive for neck pain and neck stiffness.  Skin: Negative.   Allergic/Immunologic: Negative.   Neurological: Negative.   Psychiatric/Behavioral: Negative.     Social History      He  reports that he has never smoked. He has never used smokeless tobacco. He reports that he does not drink alcohol or use drugs.       Social History   Socioeconomic History  . Marital status: Married    Spouse name: Not on file  . Number of children: Not on  file  . Years of education: Not on file  . Highest education level: Not on file  Occupational History  . Occupation: retired  Scientific laboratory technician  . Financial resource strain: Not on file  . Food insecurity:    Worry: Not on file    Inability: Not on file  . Transportation needs:    Medical: Not on file    Non-medical: Not on file  Tobacco Use  . Smoking status: Never Smoker  . Smokeless tobacco: Never Used  Substance and Sexual Activity  . Alcohol use: No    Alcohol/week: 0.0 oz    Comment: Former drinker  . Drug use: No  . Sexual activity: Yes  Lifestyle  .  Physical activity:    Days per week: Not on file    Minutes per session: Not on file  . Stress: Not on file  Relationships  . Social connections:    Talks on phone: Not on file    Gets together: Not on file    Attends religious service: Not on file    Active member of club or organization: Not on file    Attends meetings of clubs or organizations: Not on file    Relationship status: Not on file  Other Topics Concern  . Not on file  Social History Narrative  . Not on file    Past Medical History:  Diagnosis Date  . Alcoholism ([place])   . 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 Elevated liver enzymes   . Esophageal varices ([place])   . Gastritis   . Pancreatitis      Patient Active Problem List   Diagnosis Date Noted  . Hyperuricemia 10/02/2016  . Alcohol abuse 03/30/2015  . Dermatitis, eczematoid 03/20/2015  . GERD (gastroesophageal reflux disease) 03/20/2015  . History of Helicobacter pylori infection 03/20/2015  . Hearing loss of right ear 03/20/2015  . Hernia, inguinal, left 03/20/2015  . Supraspinatus sprain and strain 03/20/2015  . Cirrhosis ([place]) 03/20/2015  . Scalp laceration 02/28/2015  . Rib contusion 02/28/2015  . Splenic vein thrombosis   . History of portal hypertension   . Blood clotting disorder ([place]) 02/11/2014  . Odynophagia 02/10/2014  . Chronic pancreatitis ([place]) 12/08/2013  . Abnormal liver enzymes 12/08/2013  . Esophagitis 12/08/2013  . Gastric catarrh 12/08/2013  . H/O adenomatous polyp of colon 09/23/2012  . Esophageal varices ([place]) 09/09/2012  . Other and unspecified disc disorder of cervical region 08/17/2011  . Vitamin D deficiency 08/19/2009  . Diabetes ([place]) 10/01/2007  . 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 Depression 05/13/2007  . 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 Obesity 07/09/2005  . Hypertension 07/09/1998    Past Surgical History:  Procedure Laterality Date  . CHOLECYSTECTOMY   2006  . COLONOSCOPY WITH ESOPHAGOGASTRODUODENOSCOPY (EGD)    . COLONOSCOPY WITH PROPOFOL N/A 11/21/2015   Procedure: COLONOSCOPY WITH PROPOFOL;  Surgeon: Manya Silvas, MD;  Location: [facility] ENDOSCOPY;  Service: Endoscopy;  Laterality: N/A;  . HERNIA REPAIR     Inguinal Hernia Repai. Left: 07/28/2013 Dr. [name]; 2nd repair done 04/30/2014  . TONSILLECTOMY      Family History        Family Status  Relation Name Status  . Father  Deceased  . Mother  Alive  . Brother [employer]  . Brother Orpah Greek Deceased at age 44       Cause of death: Head trauma caused by fall  His family history includes Colon polyps in his father; Congestive Heart Failure in his brother; Dementia in his mother; Diabetes in his brother; Heart Problems in his brother; Hypertension in his brother, brother, and father; Lung cancer in his father.      Allergies  Allergen Reactions  . Hydrochlorothiazide Other (See Comments)    Reaction:  Hyponatremia     Current Outpatient Medications:  .  allopurinol (ZYLOPRIM) 100 MG tablet, TAKE 1 TABLET DAILY, Disp: 10 tablet, Rfl: 3 .  Calcium Citrate 200 MG TABS, Take 200 mg by mouth daily. Reported on 11/21/2015, Disp: , Rfl:  .  glucose blood (ONE TOUCH ULTRA TEST) test strip, Use as instructed to check blood sugar three times daily, Disp: 360 each, Rfl: 4 .  insulin aspart (NOVOLOG FLEXPEN) 100 UNIT/ML FlexPen, Up to up to 10 units three times a day before meals, Disp: 15 mL, Rfl: 3 .  Insulin Glargine (BASAGLAR KWIKPEN) 100 UNIT/ML SOPN, INJECT UP TO 20 UNITS UNDER THE SKIN AT BEDTIME, Disp: 6 pen, Rfl: 2 .  Insulin Pen Needle (PEN NEEDLES) 31G X 5 MM MISC, 1 Device by Does not apply route 3 (three) times daily., Disp: 100 each, Rfl: 0 .  lisinopril (PRINIVIL,ZESTRIL) 10 MG tablet, Take 1 tablet (10 mg total) by mouth daily., Disp: 90 tablet, Rfl: 2 .  magnesium gluconate (MAGONATE) 500 MG tablet, Take 500 mg by mouth daily. , Disp: , Rfl:  .  metFORMIN (GLUCOPHAGE-XR)  500 MG 24 hr tablet, TAKE 1 TABLET DAILY WITH   BREAKFAST, Disp: 90 tablet, Rfl: 1 .  Multiple Vitamin (MULTIVITAMIN WITH MINERALS) TABS tablet, Take 1 tablet by mouth daily., Disp: , Rfl:  .  niacin (NIASPAN) 500 MG CR tablet, Take 500 mg by mouth daily. , Disp: , Rfl:  .  omeprazole (PRILOSEC) 40 MG capsule, TAKE 1 CAPSULE (40 MG TOTAL) BY MOUTH DAILY., Disp: 30 capsule, Rfl: 5 .  Potassium Gluconate 550 MG TABS, Take 550 mg by mouth daily. , Disp: , Rfl:  .  pyridOXINE (VITAMIN B-6) 50 MG tablet, Take 50 mg by mouth daily., Disp: , Rfl:  .  sertraline (ZOLOFT) 100 MG tablet, Take 1 tablet (100 mg total) by mouth daily., Disp: 15 tablet, Rfl: 0 .  traZODone (DESYREL) 50 MG tablet, Take 50 mg by mouth at bedtime., Disp: , Rfl:  .  zinc gluconate 50 MG tablet, Take 50 mg by mouth daily., Disp: , Rfl:  .  naltrexone (DEPADE) 50 MG tablet, Take 1 tablet (50 mg total) by mouth daily., Disp: 90 tablet, Rfl: 1   Patient Care Team: Birdie Sons, MD as PCP - General (Family Medicine) Lollie Sails, MD as Consulting Physician (Gastroenterology)      Objective:   Vitals: BP 128/72 (BP Location: Right Arm, Patient Position: Sitting, Cuff Size: Normal)   Pulse 68   Temp 98.6 F (37 C)   Resp 16   Ht 5\' 5"  (1.651 m)   Wt 173 lb (78.5 kg)   BMI 28.79 kg/m    Vitals:   10/03/17 0920  BP: 128/72  Pulse: 68  Resp: 16  Temp: 98.6 F (37 C)  Weight: 173 lb (78.5 kg)  Height: 5\' 5"  (1.651 m)     Physical Exam   General Appearance:    Alert, cooperative, no distress, appears stated age  Head:    Normocephalic, without obvious abnormality, atraumatic  Eyes:    PERRL, conjunctiva/corneas clear, EOM's intact, fundi  benign, both eyes       Ears:    Normal TM's and external ear canals, both ears  Nose:   Nares normal, septum midline, mucosa normal, no drainage   or sinus tenderness  Throat:   Lips, mucosa, and tongue normal; teeth and gums normal  Neck:   Supple, symmetrical,  trachea midline, no adenopathy;       thyroid:  No enlargement/tenderness/nodules; no carotid   bruit or JVD  Back:     Symmetric, no curvature, ROM normal, no CVA tenderness  Lungs:     Clear to auscultation bilaterally, respirations unlabored  Chest wall:    No tenderness or deformity  Heart:    Regular rate and rhythm, S1 and S2 normal, no murmur, rub   or gallop  Abdomen:     Soft, non-tender, bowel sounds active all four quadrants,    no masses, no organomegaly  Genitalia:    deferred  Rectal:    deferred  Extremities:   Extremities normal, atraumatic, no cyanosis or edema  Pulses:   2+ and symmetric all extremities  Skin:   Skin color, texture, turgor normal, no rashes or lesions  Lymph nodes:   Cervical, supraclavicular, and axillary nodes normal  Neurologic:   CNII-XII intact. Normal strength, sensation and reflexes      throughout    Depression Screen PHQ 2/9 Scores 10/03/2017 10/02/2016  PHQ - 2 Score 0 0  PHQ- 9 Score - 3      Assessment & Plan:     Routine Health Maintenance and Physical Exam  Exercise Activities and Dietary recommendations Goals    None      Immunization History  Administered Date(s) Administered  . Influenza,inj,Quad PF,6+ Mos 04/11/2015  . Influenza-Unspecified 03/09/2017  . Pneumococcal Conjugate-13 10/02/2016  . Pneumococcal Polysaccharide-23 08/06/2011  . Tdap 06/05/2011  . Zoster 01/04/2011    Health Maintenance  Topic Date Due  . FOOT EXAM  09/25/1960  . OPHTHALMOLOGY EXAM  06/15/2016  . PNA vac Low Risk Adult (2 of 2 - PPSV23) 10/02/2017  . HEMOGLOBIN A1C  12/09/2017  . COLONOSCOPY  11/20/2020  . TETANUS/TDAP  06/04/2021  . INFLUENZA VACCINE  Completed  . Hepatitis C Screening  Completed     Discussed health benefits of physical activity, and encouraged him to engage in regular exercise appropriate for his age and condition.   1. Annual physical exam Doing well with unremarkable exam today. Pneumovax-23 given  today.  2. Essential hypertension Well controlled.  Continue current medications.   - Comprehensive metabolic panel - EKG [id number]  3. Type 2 diabetes mellitus without complication, with long-term current use of insulin ([place]) Due for labs. Doing well current medications.  - Lipid panel - Hemoglobin A1c  4. DDD (degenerative disc disease), cervical Has been followed by chiropractor and is considering seeing surgeon. He requests something to help pain but is aware he should not take opioids due to history alcohol dependence and should not take NSAIDs due to underlying liver disease.   5. Lipodystrophy Check lipids.   6. . Prostate cancer screening  - PSA  9. Alcohol abuse, in remission Doing well send detox at [facility] and remaining abstinent on naltrexone.    Lelon Huh, MD  [facility]

## 2017-10-04 ENCOUNTER — Telehealth: Payer: Self-pay | Admitting: *Deleted

## 2017-10-04 DIAGNOSIS — E119 Type 2 diabetes mellitus without complications: Secondary | ICD-10-CM

## 2017-10-04 LAB — COMPREHENSIVE METABOLIC PANEL
ALBUMIN: 4.2 g/dL (ref 3.6–4.8)
ALT: 21 IU/L (ref 0–44)
AST: 24 IU/L (ref 0–40)
Albumin/Globulin Ratio: 1.6 (ref 1.2–2.2)
Alkaline Phosphatase: 194 IU/L — ABNORMAL HIGH (ref 39–117)
BILIRUBIN TOTAL: 1.1 mg/dL (ref 0.0–1.2)
BUN / CREAT RATIO: 11 (ref 10–24)
BUN: 8 mg/dL (ref 8–27)
CO2: 27 mmol/L (ref 20–29)
CREATININE: 0.7 mg/dL — AB (ref 0.76–1.27)
Calcium: 9.1 mg/dL (ref 8.6–10.2)
Chloride: 99 mmol/L (ref 96–106)
GFR, EST AFRICAN AMERICAN: 113 mL/min/{1.73_m2} (ref >59)
GFR, EST NON AFRICAN AMERICAN: 98 mL/min/{1.73_m2} (ref >59)
GLUCOSE: 110 mg/dL — AB (ref 65–99)
Globulin, Total: 2.7 g/dL (ref 1.5–4.5)
Potassium: 4.6 mmol/L (ref 3.5–5.2)
Sodium: 140 mmol/L (ref 134–144)
TOTAL PROTEIN: 6.9 g/dL (ref 6.0–8.5)

## 2017-10-04 LAB — LIPID PANEL
CHOL/HDL RATIO: 3.2 ratio (ref 0.0–5.0)
Cholesterol, Total: 120 mg/dL (ref 100–199)
HDL: 38 mg/dL — AB (ref >39)
LDL Calculated: 72 mg/dL (ref 0–99)
Triglycerides: 48 mg/dL (ref 0–149)
VLDL CHOLESTEROL CAL: 10 mg/dL (ref 5–40)

## 2017-10-04 LAB — PSA: PROSTATE SPECIFIC AG, SERUM: 0.4 ng/mL (ref 0.0–4.0)

## 2017-10-04 LAB — HEMOGLOBIN A1C
Est. average glucose Bld gHb Est-mCnc: 209 mg/dL
HEMOGLOBIN A1C: 8.9 % — AB (ref 4.8–5.6)

## 2017-10-04 NOTE — Telephone Encounter (Signed)
Patient's wife Freida Busman was notified of results. Expressed understanding.

## 2017-10-04 NOTE — Telephone Encounter (Signed)
-----   Message from Birdie Sons, MD sent at 10/04/2017  7:47 AM EDT ----- a1c is a little high at 8.9%, need to increase metformin ER to 500mg  twice a day.  Rest of labs are normal. Follow up for diabetes in August, as scheduled.

## 2017-10-05 ENCOUNTER — Other Ambulatory Visit: Payer: Self-pay | Admitting: Family Medicine

## 2017-10-05 NOTE — Telephone Encounter (Signed)
Please advise patient that insurance doesn't cover One touch test strips anymore. They now cover Accucheck. Have sent in new prescription for accucheck test strips and meter

## 2017-10-06 MED ORDER — GLUCOSE BLOOD VI STRP: ORAL_STRIP | 4 refills | Status: DC

## 2017-10-06 MED ORDER — ACCU-CHEK AVIVA PLUS W/DEVICE KIT: PACK | 0 refills | Status: DC

## 2017-11-08 ENCOUNTER — Telehealth: Payer: Self-pay | Admitting: Family Medicine

## 2017-11-08 NOTE — Telephone Encounter (Signed)
Noted  

## 2017-11-08 NOTE — Telephone Encounter (Signed)
Pt called very upset stating that he didn't understand why he [facility] did his labs on 10/03/17 and he was receiving a bill for those labs from [facility]. I verified where pt went to have labs done and he advised he went to the other side of [facility]. I advised pt that we haven't had [facility] as our lab since the end of 2018 and [facility] did his labs on 10/03/17. Pt continued to yell and state that we were running a scam. Pt stated that he doesn't have to pay for labs at [facility] and why should he have to pay for [facility] to do his labs. Pt started cursing, I tried to explain that I was trying to help pt and that I would like him not to curse at me. Pt than hung up. Thanks TNP
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
## 2018-02-10 ENCOUNTER — Ambulatory Visit (INDEPENDENT_AMBULATORY_CARE_PROVIDER_SITE_OTHER): Payer: Medicare Other | Admitting: Family Medicine

## 2018-02-10 ENCOUNTER — Encounter: Payer: Self-pay | Admitting: Family Medicine

## 2018-02-10 VITALS — BP 152/96 | HR 82 | Temp 98.6°F | Resp 16 | Wt 162.0 lb

## 2018-02-10 DIAGNOSIS — T2112XA Burn of first degree of abdominal wall, initial encounter: Secondary | ICD-10-CM | POA: Diagnosis not present

## 2018-02-10 DIAGNOSIS — T2122XA Burn of second degree of abdominal wall, initial encounter: Secondary | ICD-10-CM

## 2018-02-10 DIAGNOSIS — F1010 Alcohol abuse, uncomplicated: Secondary | ICD-10-CM | POA: Diagnosis not present

## 2018-02-10 DIAGNOSIS — E119 Type 2 diabetes mellitus without complications: Secondary | ICD-10-CM

## 2018-02-10 LAB — POCT GLYCOSYLATED HEMOGLOBIN (HGB A1C)
Est. average glucose Bld gHb Est-mCnc: 140
Hemoglobin A1C: 6.5 % — AB (ref 4.0–5.6)

## 2018-02-10 MED ORDER — NALTREXONE HCL 50 MG PO TABS: 50.0000 mg | ORAL_TABLET | Freq: Every day | ORAL | 3 refills | Status: DC

## 2018-02-10 NOTE — Patient Instructions (Signed)
Stop drinking all alcoholic beverages

## 2018-02-21 ENCOUNTER — Other Ambulatory Visit: Payer: Self-pay | Admitting: Family Medicine

## 2018-02-21 DIAGNOSIS — E119 Type 2 diabetes mellitus without complications: Secondary | ICD-10-CM

## 2018-02-21 MED ORDER — ALLOPURINOL 100 MG PO TABS: 100.0000 mg | ORAL_TABLET | Freq: Every day | ORAL | 1 refills | Status: DC

## 2018-02-21 NOTE — Telephone Encounter (Signed)
Please review. Thanks!  

## 2018-02-21 NOTE — Telephone Encounter (Signed)
Pt needs a refill Allopurinol 100 mg three month supply  [facility]   Pt call back [phone]  Thanks teri

## 2018-03-04 ENCOUNTER — Other Ambulatory Visit: Payer: Self-pay | Admitting: Family Medicine

## 2018-03-04 DIAGNOSIS — E119 Type 2 diabetes mellitus without complications: Secondary | ICD-10-CM

## 2018-03-04 MED ORDER — PEN NEEDLES 31G X 5 MM MISC: 1.0000 | Freq: Three times a day (TID) | 4 refills | Status: DC

## 2018-04-14 DIAGNOSIS — Z23 Encounter for immunization: Secondary | ICD-10-CM | POA: Diagnosis not present

## 2018-05-05 DIAGNOSIS — K048 Radicular cyst: Secondary | ICD-10-CM | POA: Diagnosis not present

## 2018-05-07 DIAGNOSIS — K048 Radicular cyst: Secondary | ICD-10-CM | POA: Diagnosis not present

## 2018-05-07 DIAGNOSIS — Z0189 Encounter for other specified special examinations: Secondary | ICD-10-CM | POA: Diagnosis not present

## 2018-05-13 DIAGNOSIS — K083 Retained dental root: Secondary | ICD-10-CM | POA: Diagnosis not present

## 2018-05-13 DIAGNOSIS — K048 Radicular cyst: Secondary | ICD-10-CM | POA: Diagnosis not present

## 2018-05-29 ENCOUNTER — Encounter: Payer: Self-pay | Admitting: Family Medicine

## 2018-05-29 ENCOUNTER — Emergency Department
Admission: EM | Admit: 2018-05-29 | Discharge: 2018-05-29 | Disposition: A | Discharge status: Home / Self Care | Payer: Medicare Other | Attending: Emergency Medicine | Admitting: Emergency Medicine

## 2018-05-29 ENCOUNTER — Other Ambulatory Visit: Payer: Self-pay

## 2018-05-29 ENCOUNTER — Ambulatory Visit (INDEPENDENT_AMBULATORY_CARE_PROVIDER_SITE_OTHER): Payer: Medicare Other | Admitting: Family Medicine

## 2018-05-29 ENCOUNTER — Emergency Department: Payer: Medicare Other

## 2018-05-29 ENCOUNTER — Encounter: Payer: Self-pay | Admitting: Emergency Medicine

## 2018-05-29 VITALS — BP 106/70 | HR 99 | Temp 98.4°F | Resp 18 | Wt 154.0 lb

## 2018-05-29 DIAGNOSIS — R0789 Other chest pain: Secondary | ICD-10-CM

## 2018-05-29 DIAGNOSIS — F1010 Alcohol abuse, uncomplicated: Secondary | ICD-10-CM

## 2018-05-29 DIAGNOSIS — E119 Type 2 diabetes mellitus without complications: Secondary | ICD-10-CM

## 2018-05-29 DIAGNOSIS — J40 Bronchitis, not specified as acute or chronic: Secondary | ICD-10-CM

## 2018-05-29 DIAGNOSIS — Z5321 Procedure and treatment not carried out due to patient leaving prior to being seen by health care provider: Secondary | ICD-10-CM | POA: Insufficient documentation

## 2018-05-29 DIAGNOSIS — Z794 Long term (current) use of insulin: Secondary | ICD-10-CM

## 2018-05-29 DIAGNOSIS — R0602 Shortness of breath: Secondary | ICD-10-CM | POA: Diagnosis not present

## 2018-05-29 LAB — CBC
HCT: 44.1 % (ref 39.0–52.0)
Hemoglobin: 15.8 g/dL (ref 13.0–17.0)
MCH: 32.7 pg (ref 26.0–34.0)
MCHC: 35.8 g/dL (ref 30.0–36.0)
MCV: 91.3 fL (ref 80.0–100.0)
PLATELETS: 209 10*3/uL (ref 150–400)
RBC: 4.83 MIL/uL (ref 4.22–5.81)
RDW: 13.1 % (ref 11.5–15.5)
WBC: 5.3 10*3/uL (ref 4.0–10.5)
nRBC: 0 % (ref 0.0–0.2)

## 2018-05-29 LAB — BASIC METABOLIC PANEL
Anion gap: 15 (ref 5–15)
BUN: 9 mg/dL (ref 8–23)
CALCIUM: 8.8 mg/dL — AB (ref 8.9–10.3)
CO2: 24 mmol/L (ref 22–32)
Chloride: 100 mmol/L (ref 98–111)
Creatinine, Ser: 0.72 mg/dL (ref 0.61–1.24)
GFR calc Af Amer: >60 mL/min (ref >60)
Glucose, Bld: 296 mg/dL — ABNORMAL HIGH (ref 70–99)
POTASSIUM: 4 mmol/L (ref 3.5–5.1)
SODIUM: 139 mmol/L (ref 135–145)

## 2018-05-29 LAB — TROPONIN I

## 2018-05-29 LAB — GLUCOSE, POCT (MANUAL RESULT ENTRY): POC GLUCOSE: 311 mg/dL — AB (ref 70–99)

## 2018-05-29 MED ORDER — DOXYCYCLINE HYCLATE 100 MG PO TABS: 100.0000 mg | ORAL_TABLET | Freq: Two times a day (BID) | ORAL | 0 refills | Status: DC

## 2018-05-29 MED ORDER — PREDNISONE 20 MG PO TABS: ORAL_TABLET | ORAL | 0 refills | Status: DC

## 2018-05-29 NOTE — ED Triage Notes (Signed)
Patient reports shortness of breath and tightness in chest since this morning. Patient reports cough x1 week. Patient taking fast, deep breaths in triage. Appears anxious. Patient with clear lung sounds.

## 2018-05-29 NOTE — Progress Notes (Signed)
  Subjective:     Patient ID: KENGO STURGES, male   DOB: 1950-07-23, 67 y.o.   MRN: [id number] Chief Complaint  Patient presents with  . Cough   HPI Here ostensibly for persistent occasionally productive cough of two weeks duration. However he has not been using insulin and has resumed drinking. He presents with chest tightness. Accompanied by his wife today.  Review of Systems     Objective:   Physical Exam  Constitutional: He appears well-developed and well-nourished. He appears distressed.  Cardiovascular: Normal rate and regular rhythm.  Pulmonary/Chest: Tachypnea (kussmaul respirations with acetone smell) noted.  Musculoskeletal: He exhibits no edema (of lower extremities).       Assessment:    1. Bronchitis - predniSONE (DELTASONE) 20 MG tablet; One pill twice daily for 5 days  Dispense: 10 tablet; Refill: 0 - doxycycline (VIBRA-TABS) 100 MG tablet; Take 1 tablet (100 mg total) by mouth 2 (two) times daily.  Dispense: 20 tablet; Refill: 0  2. Alcohol abuse  3. Chest tightness - EKG 12-Lead  4. Type 2 diabetes mellitus without complication, with long-term current use of insulin ([place]): Concerned about DKA-will send to ER. - POCT glucose (manual entry)    Plan:    Sent to ER for further evaluation and treatment.

## 2018-05-29 NOTE — ED Notes (Signed)
Pts wife states that they no longer want to wait for a room. Pt in No acute distress. Ambulatory - baseline. Pt advised to follow up with PCP or return to ED for worsening sx's.

## 2018-05-29 NOTE — Addendum Note (Signed)
Addended by: Quay Burow on: 05/29/2018 04:20 PM   Modules accepted: Level of Service

## 2018-05-30 ENCOUNTER — Telehealth: Payer: Self-pay

## 2018-05-30 NOTE — Telephone Encounter (Signed)
His blood sugar was very high, but otherwise labs and ekg were normal. He needs to take his insulin consistently and follow up in a couple of weeks.

## 2018-05-30 NOTE — Telephone Encounter (Signed)
Patient's wife called saying that patient was sent into the ER yesterday, but he did not stay the entire time. She reports that they did an EKG, labs, and a xray, but they do not know the results. Could you tell them if everything was ok or if Dr. Caryn Section had any other recommendations? She reports that the patient was waiting for over 3 hours, and he wanted to go home. Please advise. Thanks!

## 2018-05-30 NOTE — Telephone Encounter (Signed)
Mrs. Verstraete advised as directed below.   Thanks,   -Mickel Baas

## 2018-06-12 ENCOUNTER — Encounter: Payer: Self-pay | Admitting: Family Medicine

## 2018-06-12 ENCOUNTER — Ambulatory Visit (INDEPENDENT_AMBULATORY_CARE_PROVIDER_SITE_OTHER): Payer: Medicare Other | Admitting: Family Medicine

## 2018-06-12 VITALS — BP 136/82 | HR 64 | Temp 98.4°F | Resp 16 | Wt 158.0 lb

## 2018-06-12 DIAGNOSIS — E119 Type 2 diabetes mellitus without complications: Secondary | ICD-10-CM

## 2018-06-12 DIAGNOSIS — I10 Essential (primary) hypertension: Secondary | ICD-10-CM | POA: Diagnosis not present

## 2018-06-12 DIAGNOSIS — Z794 Long term (current) use of insulin: Secondary | ICD-10-CM

## 2018-06-12 DIAGNOSIS — F1010 Alcohol abuse, uncomplicated: Secondary | ICD-10-CM

## 2018-06-12 LAB — POCT GLYCOSYLATED HEMOGLOBIN (HGB A1C): Hemoglobin A1C: 7.1 % — AB (ref 4.0–5.6)

## 2018-06-12 NOTE — Progress Notes (Signed)
Patient: [name] Male    DOB: 17-Jun-1951   68 y.o.   MRN: [id number] Visit Date: 06/12/2018  Today's Provider: Lelon Huh, MD   Chief Complaint  Patient presents with  . Hypertension  . Diabetes   Subjective:    HPI    Diabetes Mellitus Type II, Follow-up:   Lab Results  Component Value Date   HGBA1C 6.5 (A) 02/10/2018   HGBA1C 8.9 (H) 10/03/2017   HGBA1C 8.6 (H) 06/10/2017   Last seen for diabetes 4 months ago.  Management since then includes No Changes. He reports excellent compliance with treatment. He is not having side effects.  Home blood sugar records: fasting range: Mid 100's-200's Reports he has had exam within the last year Episodes of hypoglycemia? no   Current Insulin Regimen: Lantus 16-20 units at bedtime Novolog 6-12 units before meals.   Most Recent Eye Exam: 07/2017 Weight trend: stable Current diet: in general, a "healthy" diet   Current exercise: walking  ------------------------------------------------------------------------   Hypertension, follow-up:  BP Readings from Last 3 Encounters:  06/12/18 136/82  05/29/18 (!) 148/101  05/29/18 106/70    He was last seen for hypertension 4 months ago.  BP at that visit was 128/72. Management since that visit includes No Changes He reports excellent compliance with treatment. He is not having side effects.  He is exercising. He is adherent to low salt diet.   Outside blood pressures are: Pt states his blood pressures are "Good". He is experiencing none.  Patient denies chest pain, fatigue, lower extremity edema and palpitations.   Cardiovascular risk factors include advanced age (older than 9 for men, 65 for women), diabetes mellitus, hypertension and male gender.  Use of agents associated with hypertension: none.   ------------------------------------------------------------------------    Wt Readings from Last 3 Encounters:  06/12/18 158 lb (71.7 kg)  05/29/18 154 lb  (69.9 kg)  05/29/18 154 lb (69.9 kg)    ------------------------------------------------------------------------  Was seen for bronchitis on 05-29-2018 with bronchitis and prescribed doxycycline and prednisone which he has finished and feels much better.   He had started drinking alcohol last month, but he states he stopped again for the last couple of weeks. He is still taking Naltrexone which he is tolerating well and feels has been beneficial.     Allergies  Allergen Reactions  . Hydrochlorothiazide Other (See Comments)    Reaction:  Hyponatremia     Current Outpatient Medications:  .  allopurinol (ZYLOPRIM) 100 MG tablet, Take 1 tablet (100 mg total) by mouth daily., Disp: 90 tablet, Rfl: 1 .  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  Calcium Citrate 200 MG TABS, Take 200 mg by mouth daily. Reported on 11/21/2015, Disp: , Rfl:  .  gabapentin (NEURONTIN) 100 MG capsule, One at night for 3 nights, then one twice a day for 3 days, then one three times daily, Disp: 270 capsule, Rfl: 3 .  glucose blood (ACCU-CHEK AVIVA PLUS) test strip, Use as instructed, Disp: 360 each, Rfl: 4 .  Insulin Glargine (BASAGLAR KWIKPEN) 100 UNIT/ML SOPN, INJECT UP TO 20 UNITS      SUBCUTANEOUSLY AT BEDTIME, Disp: 30 mL, Rfl: 2 .  Insulin Pen Needle (PEN NEEDLES) 31G X 5 MM MISC, 1 Device by Does not apply route 3 (three) times daily., Disp: 100 each, Rfl: 4 .  lisinopril (PRINIVIL,ZESTRIL) 10 MG tablet, Take 1 tablet (10 mg total) by mouth daily., Disp: 90 tablet, Rfl: 2 .  magnesium gluconate (MAGONATE) 500 MG tablet, Take 500 mg by mouth daily. , Disp: , Rfl:  .  metFORMIN (GLUCOPHAGE-XR) 500 MG 24 hr tablet, TAKE 1 TABLET DAILY WITH   BREAKFAST, Disp: 90 tablet, Rfl: 1 .  Multiple Vitamin (MULTIVITAMIN WITH MINERALS) TABS tablet, Take 1 tablet by mouth daily., Disp: ,  Rfl:  .  naltrexone (DEPADE) 50 MG tablet, Take 1 tablet (50 mg total) by mouth daily., Disp: 90 tablet, Rfl: 3 .  niacin (NIASPAN) 500 MG CR tablet, Take 500 mg by mouth daily. , Disp: , Rfl:  .  NOVOLOG FLEXPEN 100 UNIT/ML FlexPen, INJECT UP TO 10 UNITS 3    TIMES A DAY BEFORE MEALS, Disp: 15 mL, Rfl: 3 .  omeprazole (PRILOSEC) 40 MG capsule, TAKE 1 CAPSULE (40 MG TOTAL) BY MOUTH DAILY., Disp: 30 capsule, Rfl: 5 .  Potassium Gluconate 550 MG TABS, Take 550 mg by mouth daily. , Disp: , Rfl:  .  pyridOXINE (VITAMIN B-6) 50 MG tablet, Take 50 mg by mouth daily., Disp: , Rfl:  .  sertraline (ZOLOFT) 100 MG tablet, Take 1 tablet (100 mg total) by mouth daily., Disp: 15 tablet, Rfl: 0 .  traZODone (DESYREL) 50 MG tablet, Take 50 mg by mouth at bedtime., Disp: , Rfl:  .  zinc gluconate 50 MG tablet, Take 50 mg by mouth daily., Disp: , Rfl:  .  doxycycline (VIBRA-TABS) 100 MG tablet, Take 1 tablet (100 mg total) by mouth 2 (two) times daily., Disp: 20 tablet, Rfl: 0 .  predniSONE (DELTASONE) 20 MG tablet, One pill twice daily for 5 days, Disp: 10 tablet, Rfl: 0  Review of Systems  Constitutional: Negative.   Respiratory: Negative.   Cardiovascular: Negative.   Gastrointestinal: Negative.   Endocrine: Positive for polydipsia. Negative for cold intolerance, heat intolerance, polyphagia and polyuria.  Musculoskeletal: Negative.   Neurological: Positive for dizziness. Negative for weakness, light-headedness, numbness and headaches.    Social History   Tobacco Use  . Smoking status: Never Smoker  . Smokeless tobacco: Never Used  Substance Use Topics  . Alcohol use: No    Alcohol/week: 0.0 standard drinks    Comment: Former drinker   Objective:   BP 136/82 (BP Location: Right Arm, Patient Position: Sitting, Cuff Size: Normal)   Pulse 64   Temp 98.4 F (36.9 C) (Oral)   Resp 16   Wt 158 lb (71.7 kg)   BMI 25.50 kg/m     Physical Exam   General Appearance:    Alert, cooperative, no  distress  Eyes:    PERRL, conjunctiva/corneas clear, EOM's intact       Lungs:     Clear to auscultation bilaterally, respirations unlabored  Heart:    Regular rate and rhythm  Neurologic:   Awake, alert, oriented x 3. No apparent focal neurological           defect.        Results for orders placed or performed in visit on 06/12/18  POCT glycosylated hemoglobin (Hb A1C)  Result Value Ref Range   Hemoglobin A1C 7.1 (A) 4.0 - 5.6 %   .    Assessment & Plan:     1. Type 2 diabetes mellitus without complication, with long-term current use of insulin ([facility]) Well controlled.  No hypoglycemia on current insulin regiment. Continue current medications.   - POCT glycosylated  hemoglobin (Hb A1C)  2. Essential hypertension Well controlled.  Continue current medications.    3. Alcohol abuse He reports he is currently abstaining from alcohol. Reinforced importance of complete alcohol abstinence. Continue naltrexone.   Follow up 3-4 months.       Lelon Huh, MD  [facility]

## 2018-06-13 ENCOUNTER — Other Ambulatory Visit: Payer: Self-pay | Admitting: Family Medicine

## 2018-06-13 DIAGNOSIS — E119 Type 2 diabetes mellitus without complications: Secondary | ICD-10-CM

## 2018-06-19 ENCOUNTER — Other Ambulatory Visit: Payer: Self-pay | Admitting: Family Medicine

## 2018-06-19 MED ORDER — LISINOPRIL 10 MG PO TABS: 10.0000 mg | ORAL_TABLET | Freq: Every day | ORAL | 2 refills | Status: DC

## 2018-06-19 NOTE — Telephone Encounter (Signed)
[place], faxed refill request for the following medications:  lisinopril (PRINIVIL,ZESTRIL) 10 MG tablet  90 day supply  Last Rx: 06/24/17 Please advise. Thanks TNP

## 2018-07-13 ENCOUNTER — Other Ambulatory Visit: Payer: Self-pay | Admitting: Family Medicine

## 2018-07-13 ENCOUNTER — Encounter: Payer: Self-pay | Admitting: Family Medicine

## 2018-07-13 DIAGNOSIS — K219 Gastro-esophageal reflux disease without esophagitis: Secondary | ICD-10-CM

## 2018-07-14 MED ORDER — OMEPRAZOLE 40 MG PO CPDR: 40.0000 mg | DELAYED_RELEASE_CAPSULE | Freq: Every day | ORAL | 5 refills | Status: DC

## 2018-08-12 DIAGNOSIS — H524 Presbyopia: Secondary | ICD-10-CM | POA: Diagnosis not present

## 2018-08-12 DIAGNOSIS — E089 Diabetes mellitus due to underlying condition without complications: Secondary | ICD-10-CM | POA: Diagnosis not present

## 2018-08-12 DIAGNOSIS — E119 Type 2 diabetes mellitus without complications: Secondary | ICD-10-CM | POA: Diagnosis not present

## 2018-08-12 DIAGNOSIS — D3132 Benign neoplasm of left choroid: Secondary | ICD-10-CM | POA: Diagnosis not present

## 2018-08-12 LAB — HM DIABETES EYE EXAM

## 2018-08-13 ENCOUNTER — Encounter: Payer: Self-pay | Admitting: Family Medicine

## 2018-09-15 ENCOUNTER — Encounter: Payer: Self-pay | Admitting: Family Medicine

## 2018-09-15 ENCOUNTER — Ambulatory Visit (INDEPENDENT_AMBULATORY_CARE_PROVIDER_SITE_OTHER): Payer: Medicare HMO | Admitting: Family Medicine

## 2018-09-15 ENCOUNTER — Other Ambulatory Visit: Payer: Self-pay

## 2018-09-15 VITALS — BP 140/100 | HR 77 | Temp 97.9°F | Ht 65.0 in | Wt 152.8 lb

## 2018-09-15 DIAGNOSIS — R110 Nausea: Secondary | ICD-10-CM | POA: Diagnosis not present

## 2018-09-15 DIAGNOSIS — E881 Lipodystrophy, not elsewhere classified: Secondary | ICD-10-CM | POA: Diagnosis not present

## 2018-09-15 DIAGNOSIS — E559 Vitamin D deficiency, unspecified: Secondary | ICD-10-CM

## 2018-09-15 DIAGNOSIS — Z125 Encounter for screening for malignant neoplasm of prostate: Secondary | ICD-10-CM

## 2018-09-15 DIAGNOSIS — M62830 Muscle spasm of back: Secondary | ICD-10-CM | POA: Diagnosis not present

## 2018-09-15 DIAGNOSIS — K7460 Unspecified cirrhosis of liver: Secondary | ICD-10-CM | POA: Diagnosis not present

## 2018-09-15 DIAGNOSIS — I10 Essential (primary) hypertension: Secondary | ICD-10-CM | POA: Diagnosis not present

## 2018-09-15 DIAGNOSIS — Z794 Long term (current) use of insulin: Secondary | ICD-10-CM | POA: Diagnosis not present

## 2018-09-15 DIAGNOSIS — E119 Type 2 diabetes mellitus without complications: Secondary | ICD-10-CM | POA: Diagnosis not present

## 2018-09-15 LAB — POCT GLYCOSYLATED HEMOGLOBIN (HGB A1C): Hemoglobin A1C: 6.6 % — AB (ref 4.0–5.6)

## 2018-09-15 MED ORDER — CYCLOBENZAPRINE HCL 5 MG PO TABS: 5.0000 mg | ORAL_TABLET | Freq: Three times a day (TID) | ORAL | 1 refills | Status: DC | PRN

## 2018-09-15 MED ORDER — PROMETHAZINE HCL 25 MG PO TABS: 25.0000 mg | ORAL_TABLET | Freq: Three times a day (TID) | ORAL | 0 refills | Status: DC | PRN

## 2018-09-15 NOTE — Patient Instructions (Signed)
.   Please review the attached list of medications and notify my office if there are any errors.   . Please bring all of your medications to every appointment so we can make sure that our medication list is the same as yours.   

## 2018-09-15 NOTE — Progress Notes (Signed)
Patient: [name] Male    DOB: 09/20/1950   68 y.o.   MRN: [id number] Visit Date: 09/15/2018  Today's Provider: Lelon Huh, MD   Chief Complaint  Patient presents with  . Diabetes    3 month fup   Subjective:     HPI   Diabetes Mellitus Type II, Follow-up:   Lab Results  Component Value Date   HGBA1C 7.1 (A) 06/12/2018   HGBA1C 6.5 (A) 02/10/2018   HGBA1C 8.9 (H) 10/03/2017    Last seen for diabetes 3 months ago.  Management since then includes no changes. He reports good compliance with treatment. He is having side effects. Diarrhea a lot  Current symptoms include diarrhea and have been unchanged. Home blood sugar records: fasting range: 77 this morning others running 70-130  Episodes of hypoglycemia? yes -    Current Insulin Regimen: novolog 6 units to 10-12 and lantus 16 units night time Most Recent Eye Exam: 07/2018 Weight trend: fluctuating a bit Prior visit with dietician: No Current exercise: walking Current diet habits: well balanced   Pertinent Labs:    Component Value Date/Time   CHOL 120 10/03/2017 1114   CHOL 92 09/07/2012 0406   TRIG 48 10/03/2017 1114   TRIG 116 09/07/2012 0406   HDL 38 (L) 10/03/2017 1114   HDL 21 (L) 09/07/2012 0406   LDLCALC 72 10/03/2017 1114   LDLCALC 67 06/10/2017 1110   LDLCALC 48 09/07/2012 0406   CREATININE 0.72 05/29/2018 1611   CREATININE 0.81 06/10/2017 1110    Wt Readings from Last 3 Encounters:  06/12/18 158 lb (71.7 kg)  05/29/18 154 lb (69.9 kg)  05/29/18 154 lb (69.9 kg)    ------------------------------------------------------------------------ He also reports long history of spasms in his upper back previously prescribed flexeiril which he states was very effective for him. He request new prescription for this today.Also has episodes of nausea a few times a week. Has on old prescription for nausea pill that he would like refilled, but not sure which medication it was.  He states he  continues to abstain from alcohol. He is still taking natrexone which he is tolerating well, but still has urge to drink. States he doesn't remember how long ago his last drink was, but its been 'awhile.'  Allergies  Allergen Reactions  . Hydrochlorothiazide Other (See Comments)    Reaction:  Hyponatremia     Current Outpatient Medications:  .  ACCU-CHEK FASTCLIX LANCETS MISC, USE TO CHECK BLOOD SUGAR 3 TIMES A DAY, Disp: 306 each, Rfl: 4 .  allopurinol (ZYLOPRIM) 100 MG tablet, TAKE 1 TABLET DAILY, Disp: 90 tablet, Rfl: 1 .  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  Calcium Citrate 200 MG TABS, Take 200 mg by mouth daily. Reported on 11/21/2015, Disp: , Rfl:  .  gabapentin (NEURONTIN) 100 MG capsule, One at night for 3 nights, then one twice a day for 3 days, then one three times daily, Disp: 270 capsule, Rfl: 3 .  glucose blood (ACCU-CHEK AVIVA PLUS) test strip, Use as instructed, Disp: 360 each, Rfl: 4 .  Insulin Glargine (BASAGLAR KWIKPEN) 100 UNIT/ML SOPN, INJECT UP TO 20 UNITS      SUBCUTANEOUSLY AT BEDTIME, Disp: 30 mL, Rfl: 2 .  Insulin Pen Needle (PEN NEEDLES) 31G X 5 MM MISC, 1 Device by Does not apply  route 3 (three) times daily., Disp: 100 each, Rfl: 4 .  lisinopril (PRINIVIL,ZESTRIL) 10 MG tablet, Take 1 tablet (10 mg total) by mouth daily., Disp: 90 tablet, Rfl: 2 .  magnesium gluconate (MAGONATE) 500 MG tablet, Take 500 mg by mouth daily. , Disp: , Rfl:  .  metFORMIN (GLUCOPHAGE-XR) 500 MG 24 hr tablet, TAKE 1 TABLET DAILY WITH   BREAKFAST, Disp: 90 tablet, Rfl: 1 .  Multiple Vitamin (MULTIVITAMIN WITH MINERALS) TABS tablet, Take 1 tablet by mouth daily., Disp: , Rfl:  .  naltrexone (DEPADE) 50 MG tablet, Take 1 tablet (50 mg total) by mouth daily., Disp: 90 tablet, Rfl: 3 .  niacin (NIASPAN) 500 MG CR tablet, Take 500 mg by mouth daily. ,  Disp: , Rfl:  .  NOVOLOG FLEXPEN 100 UNIT/ML FlexPen, INJECT UP TO 10 UNITS 3    TIMES A DAY BEFORE MEALS, Disp: 15 mL, Rfl: 3 .  omeprazole (PRILOSEC) 40 MG capsule, Take 1 capsule (40 mg total) by mouth daily., Disp: 30 capsule, Rfl: 5 .  Potassium Gluconate 550 MG TABS, Take 550 mg by mouth daily. , Disp: , Rfl:  .  pyridOXINE (VITAMIN B-6) 50 MG tablet, Take 50 mg by mouth daily., Disp: , Rfl:  .  sertraline (ZOLOFT) 100 MG tablet, Take 1 tablet (100 mg total) by mouth daily., Disp: 15 tablet, Rfl: 0 .  traZODone (DESYREL) 50 MG tablet, Take 50 mg by mouth at bedtime., Disp: , Rfl:  .  zinc gluconate 50 MG tablet, Take 50 mg by mouth daily., Disp: , Rfl:   Review of Systems  Constitutional: Negative.   HENT: Negative.   Eyes: Negative.   Respiratory: Negative.   Cardiovascular: Negative.   Gastrointestinal: Positive for diarrhea.  Endocrine: Negative.   Genitourinary: Negative.   Musculoskeletal: Negative.   Skin: Negative.   Allergic/Immunologic: Negative.   Neurological: Negative.   Hematological: Negative.   Psychiatric/Behavioral: Negative.     Social History   Tobacco Use  . Smoking status: Never Smoker  . Smokeless tobacco: Never Used  Substance Use Topics  . Alcohol use: No    Alcohol/week: 0.0 standard drinks    Comment: Former drinker      Objective:   BP (!) 140/100 (BP Location: Right Arm, Patient Position: Sitting, Cuff Size: Normal)   Pulse 77   Temp 97.9 F (36.6 C) (Oral)   Ht 5' 5"  (1.651 m)   Wt 152 lb 12.8 oz (69.3 kg)   SpO2 98%   BMI 25.43 kg/m    Physical Exam   General appearance: alert, well developed, well nourished, cooperative and in no distress Head: Normocephalic, without obvious abnormality, atraumatic Respiratory: Respirations even and unlabored, normal respiratory rate Extremities: No gross deformities Skin: Skin color, texture, turgor normal. No rashes seen  Psych: Appropriate mood and affect. Neurologic: Mental status:  Alert, oriented to person, place, and time, thought content appropriate.  Results for orders placed or performed in visit on 09/15/18  POCT glycosylated hemoglobin (Hb A1C)  Result Value Ref Range   Hemoglobin A1C 6.6 (A) 4.0 - 5.6 %   HbA1c POC (<> result, manual entry)     HbA1c, POC (prediabetic range)     HbA1c, POC (controlled diabetic range)         Assessment & Plan    1. Type 2 diabetes mellitus without complication, with long-term current use of insulin (HCC) Well controlled.  Continue current medications.   - POCT glycosylated hemoglobin (Hb A1C)  2.  Cirrhosis of liver without ascites, unspecified hepatic cirrhosis type (HCC) Stable. Continues to abstain from alcohol.  - Comprehensive metabolic panel - CBC  3. Nausea - promethazine (PHENERGAN) 25 MG tablet; Take 1 tablet (25 mg total) by mouth every 8 (eight) hours as needed for nausea or vomiting.  Dispense: 20 tablet; Refill: 0  4. Back spasm RX- cyclobenzaprine (FLEXERIL) 5 MG tablet; Take 1 tablet (5 mg total) by mouth 3 (three) times daily as needed for muscle spasms.  Dispense: 30 tablet; Refill: 1  5. Essential hypertension Well controlled.  Continue current medications.    6. Vitamin D deficiency  - VITAMIN D 25 Hydroxy (Vit-D Deficiency, Fractures)  7. Lipodystrophy  - Lipid panel  8. Prostate cancer screening  - PSA     Lelon Huh, MD  [facility]

## 2018-09-16 ENCOUNTER — Telehealth: Payer: Self-pay

## 2018-09-16 LAB — CBC
HEMATOCRIT: 44.9 % (ref 37.5–51.0)
HEMOGLOBIN: 15.4 g/dL (ref 13.0–17.7)
MCH: 33.6 pg — AB (ref 26.6–33.0)
MCHC: 34.3 g/dL (ref 31.5–35.7)
MCV: 98 fL — ABNORMAL HIGH (ref 79–97)
Platelets: 150 10*3/uL (ref 150–450)
RBC: 4.59 x10E6/uL (ref 4.14–5.80)
RDW: 13 % (ref 11.6–15.4)
WBC: 4 10*3/uL (ref 3.4–10.8)

## 2018-09-16 LAB — LIPID PANEL
CHOL/HDL RATIO: 2.2 ratio (ref 0.0–5.0)
CHOLESTEROL TOTAL: 115 mg/dL (ref 100–199)
HDL: 52 mg/dL (ref >39)
LDL CALC: 49 mg/dL (ref 0–99)
Triglycerides: 68 mg/dL (ref 0–149)
VLDL Cholesterol Cal: 14 mg/dL (ref 5–40)

## 2018-09-16 LAB — COMPREHENSIVE METABOLIC PANEL
A/G RATIO: 1.8 (ref 1.2–2.2)
ALBUMIN: 3.9 g/dL (ref 3.8–4.8)
ALT: 96 IU/L — AB (ref 0–44)
AST: 157 IU/L — ABNORMAL HIGH (ref 0–40)
Alkaline Phosphatase: 142 IU/L — ABNORMAL HIGH (ref 39–117)
BUN / CREAT RATIO: 8 — AB (ref 10–24)
BUN: 6 mg/dL — ABNORMAL LOW (ref 8–27)
Bilirubin Total: 0.8 mg/dL (ref 0.0–1.2)
CALCIUM: 8.9 mg/dL (ref 8.6–10.2)
CO2: 29 mmol/L (ref 20–29)
Chloride: 98 mmol/L (ref 96–106)
Creatinine, Ser: 0.71 mg/dL — ABNORMAL LOW (ref 0.76–1.27)
GFR, EST AFRICAN AMERICAN: 112 mL/min/{1.73_m2} (ref >59)
GFR, EST NON AFRICAN AMERICAN: 97 mL/min/{1.73_m2} (ref >59)
Globulin, Total: 2.2 g/dL (ref 1.5–4.5)
Glucose: 87 mg/dL (ref 65–99)
POTASSIUM: 4.1 mmol/L (ref 3.5–5.2)
Sodium: 142 mmol/L (ref 134–144)
TOTAL PROTEIN: 6.1 g/dL (ref 6.0–8.5)

## 2018-09-16 LAB — VITAMIN D 25 HYDROXY (VIT D DEFICIENCY, FRACTURES): VIT D 25 HYDROXY: 13 ng/mL — AB (ref 30.0–100.0)

## 2018-09-16 LAB — PSA: Prostate Specific Ag, Serum: 0.5 ng/mL (ref 0.0–4.0)

## 2018-09-16 NOTE — Telephone Encounter (Signed)
LMTCB 09/16/2018  Thanks,   -Mickel Baas

## 2018-09-16 NOTE — Telephone Encounter (Signed)
-----   Message from Birdie Sons, MD sent at 09/16/2018  1:35 PM EDT ----- Vitamin d levels are low. Liver functions are high.  Please double check and see If he is taking Niacin anymore. If so then he should stop since it can increase liver functions. Need to completely abstain from alcohol and avoid OTC ibuprofen and naproxen (Aleve) as much as possible.  Need to start OTC vitamin D3 2000 units once a day due to low vitamin d levels.  Follow up in July as scheduled.

## 2018-09-18 NOTE — Telephone Encounter (Signed)
Tried calling patient on his home number. Left message to call back. I also tried calling his cell phone, but received no answer. I was unable to leave a voice message on his cell phone since  voice message system has not been set up yet.

## 2018-09-19 NOTE — Telephone Encounter (Signed)
Pt returned call ° °teri °

## 2018-09-19 NOTE — Telephone Encounter (Signed)
LMTCB on home VM

## 2018-09-22 NOTE — Telephone Encounter (Signed)
Pt advised.   Thanks,   -Vencent Hauschild  

## 2019-01-07 ENCOUNTER — Other Ambulatory Visit: Payer: Self-pay | Admitting: Family Medicine

## 2019-01-07 MED ORDER — ALLOPURINOL 100 MG PO TABS: 100.0000 mg | ORAL_TABLET | Freq: Every day | ORAL | 0 refills | Status: DC

## 2019-01-07 NOTE — Telephone Encounter (Signed)
[facility] faxed refill request for the following medications:  allopurinol (ZYLOPRIM) 100 MG tablet   Please advise.  

## 2019-01-18 ENCOUNTER — Other Ambulatory Visit: Payer: Self-pay | Admitting: Family Medicine

## 2019-01-18 DIAGNOSIS — K219 Gastro-esophageal reflux disease without esophagitis: Secondary | ICD-10-CM

## 2019-01-19 ENCOUNTER — Ambulatory Visit (INDEPENDENT_AMBULATORY_CARE_PROVIDER_SITE_OTHER): Payer: Medicare HMO | Admitting: Family Medicine

## 2019-01-19 ENCOUNTER — Other Ambulatory Visit: Payer: Self-pay

## 2019-01-19 ENCOUNTER — Encounter: Payer: Self-pay | Admitting: Family Medicine

## 2019-01-19 VITALS — BP 142/95 | HR 77 | Temp 98.7°F | Wt 153.0 lb

## 2019-01-19 DIAGNOSIS — E119 Type 2 diabetes mellitus without complications: Secondary | ICD-10-CM | POA: Diagnosis not present

## 2019-01-19 DIAGNOSIS — I10 Essential (primary) hypertension: Secondary | ICD-10-CM | POA: Diagnosis not present

## 2019-01-19 DIAGNOSIS — R748 Abnormal levels of other serum enzymes: Secondary | ICD-10-CM

## 2019-01-19 DIAGNOSIS — R350 Frequency of micturition: Secondary | ICD-10-CM | POA: Diagnosis not present

## 2019-01-19 DIAGNOSIS — E559 Vitamin D deficiency, unspecified: Secondary | ICD-10-CM | POA: Diagnosis not present

## 2019-01-19 DIAGNOSIS — Z794 Long term (current) use of insulin: Secondary | ICD-10-CM | POA: Diagnosis not present

## 2019-01-19 DIAGNOSIS — K7460 Unspecified cirrhosis of liver: Secondary | ICD-10-CM

## 2019-01-19 LAB — POCT URINALYSIS DIPSTICK
Bilirubin, UA: NEGATIVE
Blood, UA: NEGATIVE
Glucose, UA: NEGATIVE
Ketones, UA: NEGATIVE
Leukocytes, UA: NEGATIVE
Nitrite, UA: NEGATIVE
Protein, UA: NEGATIVE
Spec Grav, UA: 1.01 (ref 1.010–1.025)
Urobilinogen, UA: 0.2 E.U./dL
pH, UA: 8 (ref 5.0–8.0)

## 2019-01-19 LAB — POCT GLYCOSYLATED HEMOGLOBIN (HGB A1C): Hemoglobin A1C: 8.8 % — AB (ref 4.0–5.6)

## 2019-01-19 NOTE — Patient Instructions (Signed)
.   Please review the attached list of medications and notify my office if there are any errors.   . Please bring all of your medications to every appointment so we can make sure that our medication list is the same as yours.   . We will have flu vaccines available after Labor Day. Please go to your pharmacy or call the office in early September to schedule you flu shot.   

## 2019-01-19 NOTE — Progress Notes (Signed)
Patient: [name] Male    DOB: 03-Dec-1950   68 y.o.   MRN: [id number] Visit Date: 01/19/2019  Today's Provider: Lelon Huh, MD   Chief Complaint  Patient presents with  . Diabetes  . Hypertension  . Follow-up    Elevated liver enzymes   Subjective:     HPI     Follow up for elevated liver enzymes  The patient was last seen for this 4 months ago. Changes made at last visit include stop niacin, completely abstain from alcohol and avoid Ibuprofen and Aleve.  He reports excellent compliance with treatment.  ------------------------------------------------------------------------------------     Diabetes Mellitus Type II, Follow-up:   Lab Results  Component Value Date   HGBA1C 6.6 (A) 09/15/2018   HGBA1C 7.1 (A) 06/12/2018   HGBA1C 6.5 (A) 02/10/2018   Last seen for diabetes 4 months ago.  Management since then includes no changes. He reports fair compliance with treatment. He is not having side effects.  Current symptoms include none and have been stable. Home blood sugar records: fasting range: 120's  Episodes of hypoglycemia? no   Current Insulin Regimen: states has cut back basaglar to 16 units and and novolog up to 6-10 units.  Most Recent Eye Exam: 08/12/2018 Weight trend: stable Current diet: in general, a "healthy" diet   Current exercise: walking  ------------------------------------------------------------------------   Hypertension, follow-up:  BP Readings from Last 3 Encounters:  01/19/19 (!) 142/95  09/15/18 (!) 140/100  06/12/18 136/82    He was last seen for hypertension 4 months ago.  BP at that visit was 140/100. Management since that visit includes no changes He reports excellent compliance with treatment. He is not having side effects.  He is exercising. He is not adherent to low salt diet.   Outside blood pressures are 130-140's/80-90's He is experiencing   Patient denies chest pain, fatigue, lower extremity  edema and palpitations.   Cardiovascular risk factors include advanced age (older than 80 for men, 50 for women), diabetes mellitus, dyslipidemia, hypertension and male gender.  Use of agents associated with hypertension: none.   ------------------------------------------------------------------------   States he has been having urinary urgency the last few months, no hesitancy, pain on urination, or nocturia.   He states he continues to abstain for alcohol use, has not had a drink for several months.     Allergies  Allergen Reactions  . Hydrochlorothiazide Other (See Comments)    Reaction:  Hyponatremia     Current Outpatient Medications:  .  ACCU-CHEK FASTCLIX LANCETS MISC, USE TO CHECK BLOOD SUGAR 3 TIMES A DAY, Disp: 306 each, Rfl: 4 .  allopurinol (ZYLOPRIM) 100 MG tablet, Take 1 tablet (100 mg total) by mouth daily., Disp: 90 tablet, Rfl: 0 .  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  Calcium Citrate 200 MG TABS, Take 200 mg by mouth daily. Reported on 11/21/2015, Disp: , Rfl:  .  cyclobenzaprine (FLEXERIL) 5 MG tablet, Take 1 tablet (5 mg total) by mouth 3 (three) times daily as needed for muscle spasms., Disp: 30 tablet, Rfl: 1 .  gabapentin (NEURONTIN) 100 MG capsule, One at night for 3 nights, then one twice a day for 3 days, then one three times daily, Disp: 270 capsule, Rfl: 3 .  glucose blood (ACCU-CHEK AVIVA PLUS) test strip, Use as instructed, Disp: 360 each,  Rfl: 4 .  Insulin Glargine (BASAGLAR KWIKPEN) 100 UNIT/ML SOPN, INJECT UP TO 20 UNITS      SUBCUTANEOUSLY AT BEDTIME, Disp: 30 mL, Rfl: 2 .  Insulin Pen Needle (PEN NEEDLES) 31G X 5 MM MISC, 1 Device by Does not apply route 3 (three) times daily., Disp: 100 each, Rfl: 4 .  lisinopril (PRINIVIL,ZESTRIL) 10 MG tablet, Take 1 tablet (10 mg total) by mouth daily., Disp: 90 tablet, Rfl:  2 .  magnesium gluconate (MAGONATE) 500 MG tablet, Take 500 mg by mouth daily. , Disp: , Rfl:  .  Multiple Vitamin (MULTIVITAMIN WITH MINERALS) TABS tablet, Take 1 tablet by mouth daily., Disp: , Rfl:  .  naltrexone (DEPADE) 50 MG tablet, Take 1 tablet (50 mg total) by mouth daily., Disp: 90 tablet, Rfl: 3 .  NOVOLOG FLEXPEN 100 UNIT/ML FlexPen, INJECT UP TO 10 UNITS 3    TIMES A DAY BEFORE MEALS, Disp: 15 mL, Rfl: 3 .  omeprazole (PRILOSEC) 40 MG capsule, TAKE 1 CAPSULE BY MOUTH EVERY DAY, Disp: 90 capsule, Rfl: 4 .  Potassium Gluconate 550 MG TABS, Take 550 mg by mouth daily. , Disp: , Rfl:  .  promethazine (PHENERGAN) 25 MG tablet, Take 1 tablet (25 mg total) by mouth every 8 (eight) hours as needed for nausea or vomiting., Disp: 20 tablet, Rfl: 0 .  pyridOXINE (VITAMIN B-6) 50 MG tablet, Take 50 mg by mouth daily., Disp: , Rfl:  .  sertraline (ZOLOFT) 100 MG tablet, Take 1 tablet (100 mg total) by mouth daily., Disp: 15 tablet, Rfl: 0 .  traZODone (DESYREL) 50 MG tablet, Take 50 mg by mouth at bedtime., Disp: , Rfl:  .  zinc gluconate 50 MG tablet, Take 50 mg by mouth daily., Disp: , Rfl:  .  niacin (NIASPAN) 500 MG CR tablet, Take 500 mg by mouth daily. , Disp: , Rfl:   Review of Systems  Constitutional: Negative.   Respiratory: Negative.   Cardiovascular: Negative.   Gastrointestinal: Negative.   Endocrine: Negative.   Neurological: Positive for light-headedness. Negative for dizziness and headaches.    Social History   Tobacco Use  . Smoking status: Never Smoker  . Smokeless tobacco: Never Used  Substance Use Topics  . Alcohol use: No    Alcohol/week: 0.0 standard drinks    Comment: Former drinker      Objective:   BP (!) 142/95 (BP Location: Right Arm, Patient Position: Sitting, Cuff Size: Normal)   Pulse 77   Temp 98.7 F (37.1 C) (Oral)   Wt 153 lb (69.4 kg)   BMI 25.46 kg/m  Vitals:   01/19/19 0908  BP: (!) 142/95  Pulse: 77  Temp: 98.7 F (37.1 C)   TempSrc: Oral  Weight: 153 lb (69.4 kg)     Physical Exam   General Appearance:    Alert, cooperative, no distress  Eyes:    PERRL, conjunctiva/corneas clear, EOM's intact       Lungs:     Clear to auscultation bilaterally, respirations unlabored  Heart:    Regular rate and rhythm  Neurologic:   Awake, alert, oriented x 3. No apparent focal neurological           defect.        Results for orders placed or performed in visit on 01/19/19  POCT glycosylated hemoglobin (Hb A1C)  Result Value Ref Range   Hemoglobin A1C 8.8 (A) 4.0 - 5.6 %       Assessment & Plan  1. Essential hypertension Relatively stable. Continue current medications.    2. Type 2 diabetes mellitus without complication, with long-term current use of insulin (HCC) a1c is up somewhat, but has not been taking metformin consistently. See had labs look. If renal function stable will work on getting back on metformin.   3. Abnormal liver enzymes  - Comprehensive metabolic panel  4. Cirrhosis of liver without ascites, unspecified hepatic cirrhosis type ([place]) Due for [employer] screening.  - Comprehensive metabolic panel - AFP tumor marker - US Abdomen Limited RUQ; Future  5. Vitamin D deficiency  - VITAMIN D 25 Hydroxy (Vit-D Deficiency, Fractures)  6. Urinary frequency Normal u/a. Recent PSA was normal. Sounds more like irritable bladder.     Lelon Huh, MD  [facility]

## 2019-01-20 LAB — COMPREHENSIVE METABOLIC PANEL
ALT: 42 IU/L (ref 0–44)
AST: 37 IU/L (ref 0–40)
Albumin/Globulin Ratio: 1.8 (ref 1.2–2.2)
Albumin: 4.1 g/dL (ref 3.8–4.8)
Alkaline Phosphatase: 134 IU/L — ABNORMAL HIGH (ref 39–117)
BUN/Creatinine Ratio: 9 — ABNORMAL LOW (ref 10–24)
BUN: 6 mg/dL — ABNORMAL LOW (ref 8–27)
Bilirubin Total: 1.1 mg/dL (ref 0.0–1.2)
CO2: 23 mmol/L (ref 20–29)
Calcium: 9.3 mg/dL (ref 8.6–10.2)
Chloride: 97 mmol/L (ref 96–106)
Creatinine, Ser: 0.7 mg/dL — ABNORMAL LOW (ref 0.76–1.27)
GFR calc Af Amer: 112 mL/min/{1.73_m2} (ref >59)
GFR calc non Af Amer: 97 mL/min/{1.73_m2} (ref >59)
Globulin, Total: 2.3 g/dL (ref 1.5–4.5)
Glucose: 159 mg/dL — ABNORMAL HIGH (ref 65–99)
Potassium: 4 mmol/L (ref 3.5–5.2)
Sodium: 137 mmol/L (ref 134–144)
Total Protein: 6.4 g/dL (ref 6.0–8.5)

## 2019-01-20 LAB — AFP TUMOR MARKER: AFP, Serum, Tumor Marker: 4.6 ng/mL (ref 0.0–8.3)

## 2019-01-20 LAB — VITAMIN D 25 HYDROXY (VIT D DEFICIENCY, FRACTURES): Vit D, 25-Hydroxy: 29.6 ng/mL — ABNORMAL LOW (ref 30.0–100.0)

## 2019-01-29 ENCOUNTER — Other Ambulatory Visit: Payer: Self-pay

## 2019-01-29 ENCOUNTER — Telehealth: Payer: Self-pay

## 2019-01-29 ENCOUNTER — Ambulatory Visit
Admission: RE | Admit: 2019-01-29 | Discharge: 2019-01-29 | Disposition: A | Discharge status: Home / Self Care | Payer: Medicare HMO | Source: Ambulatory Visit | Attending: Family Medicine | Admitting: Family Medicine

## 2019-01-29 DIAGNOSIS — K7460 Unspecified cirrhosis of liver: Secondary | ICD-10-CM | POA: Diagnosis not present

## 2019-01-29 DIAGNOSIS — K760 Fatty (change of) liver, not elsewhere classified: Secondary | ICD-10-CM | POA: Diagnosis not present

## 2019-01-29 NOTE — Telephone Encounter (Signed)
LMTCB 01/29/2019  Thanks,   -Mickel Baas

## 2019-01-29 NOTE — Telephone Encounter (Signed)
-----   Message from Birdie Sons, MD sent at 01/29/2019  3:50 PM EDT ----- Liver looks better. No sign of liver cancer. Repeat in 1 year.

## 2019-02-02 NOTE — Telephone Encounter (Signed)
Tried calling; pt's mailbox is full.   Thanks,   -Laura  

## 2019-02-02 NOTE — Telephone Encounter (Signed)
Yes, that was what the first message in this thread was referencing. He has fatty liver and looks to be improving. Recheck in a year.

## 2019-02-02 NOTE — Telephone Encounter (Signed)
Tried calling patient. Voice mailbox full.

## 2019-02-02 NOTE — Telephone Encounter (Signed)
Pt advised.  He was wondering if you got the results of the U/S he had last week.     Contact number: [phone] (Pt's Wife)   Thanks,   Mickel Baas

## 2019-02-03 NOTE — Telephone Encounter (Signed)
Mrs.Pizzuto advised.  (On DPR)   Thanks,   -Mickel Baas

## 2019-03-18 ENCOUNTER — Other Ambulatory Visit: Payer: Self-pay | Admitting: Family Medicine

## 2019-05-01 DIAGNOSIS — R69 Illness, unspecified: Secondary | ICD-10-CM | POA: Diagnosis not present

## 2019-05-22 ENCOUNTER — Ambulatory Visit (INDEPENDENT_AMBULATORY_CARE_PROVIDER_SITE_OTHER): Payer: Medicare HMO | Admitting: Family Medicine

## 2019-05-22 ENCOUNTER — Encounter: Payer: Self-pay | Admitting: Family Medicine

## 2019-05-22 DIAGNOSIS — Z5329 Procedure and treatment not carried out because of patient's decision for other reasons: Secondary | ICD-10-CM

## 2019-05-22 NOTE — Progress Notes (Deleted)
{Method of visit:23308}  Patient: [name] Male    DOB: [birth year]   68 y.o.   MRN: [id number] Visit Date: 05/22/2019  Today's Provider: Lelon Huh, MD   Chief Complaint  Patient presents with  . URI   Subjective:    Virtual Visit via Telephone Note  I connected with [name] on 05/22/19 at  8:40 AM EST by telephone and verified that I am speaking with the correct person using two identifiers.  Location: Patient: *** Provider: ***   I discussed the limitations, risks, security and privacy concerns of performing an evaluation and management service by telephone and the availability of in person appointments. I also discussed with the patient that there may be a patient responsible charge related to this service. The patient expressed understanding and agreed to proceed.  URI  This is a new problem. The problem has been gradually improving. Associated symptoms include coughing, diarrhea and vomiting. Associated symptoms comments: Fever, dizziness, fatigue, and SOB.    Allergies  Allergen Reactions  . Hydrochlorothiazide Other (See Comments)    Reaction:  Hyponatremia     Current Outpatient Medications:  .  ACCU-CHEK FASTCLIX LANCETS MISC, USE TO CHECK BLOOD SUGAR 3 TIMES A DAY, Disp: 306 each, Rfl: 4 .  allopurinol (ZYLOPRIM) 100 MG tablet, Take 1 tablet (100 mg total) by mouth daily., Disp: 90 tablet, Rfl: 0 .  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  Calcium Citrate 200 MG TABS, Take 200 mg by mouth daily. Reported on 11/21/2015, Disp: , Rfl:  .  cyclobenzaprine (FLEXERIL) 5 MG tablet, Take 1 tablet (5 mg total) by mouth 3 (three) times daily as needed for muscle spasms., Disp: 30 tablet, Rfl: 1 .  gabapentin (NEURONTIN) 100 MG capsule, One at night for 3 nights, then one twice a day for 3 days, then one three times daily,  Disp: 270 capsule, Rfl: 3 .  glucose blood (ACCU-CHEK AVIVA PLUS) test strip, Use as instructed, Disp: 360 each, Rfl: 4 .  Insulin Glargine (BASAGLAR KWIKPEN) 100 UNIT/ML SOPN, INJECT UP TO 20 UNITS      SUBCUTANEOUSLY AT BEDTIME, Disp: 30 mL, Rfl: 2 .  Insulin Pen Needle (PEN NEEDLES) 31G X 5 MM MISC, 1 Device by Does not apply route 3 (three) times daily., Disp: 100 each, Rfl: 4 .  lisinopril (ZESTRIL) 10 MG tablet, TAKE 1 TABLET BY MOUTH EVERY DAY, Disp: 90 tablet, Rfl: 4 .  magnesium gluconate (MAGONATE) 500 MG tablet, Take 500 mg by mouth daily. , Disp: , Rfl:  .  Multiple Vitamin (MULTIVITAMIN WITH MINERALS) TABS tablet, Take 1 tablet by mouth daily., Disp: , Rfl:  .  naltrexone (DEPADE) 50 MG tablet, Take 1 tablet (50 mg total) by mouth daily., Disp: 90 tablet, Rfl: 3 .  NOVOLOG FLEXPEN 100 UNIT/ML FlexPen, INJECT UP TO 10 UNITS 3    TIMES A DAY BEFORE MEALS, Disp: 15 mL, Rfl: 3 .  omeprazole (PRILOSEC) 40 MG capsule, TAKE 1 CAPSULE BY MOUTH EVERY DAY, Disp: 90 capsule, Rfl: 4 .  Potassium Gluconate 550 MG TABS, Take 550 mg by mouth daily. , Disp: , Rfl:  .  promethazine (PHENERGAN) 25 MG tablet, Take 1 tablet (25 mg total) by mouth every 8 (eight) hours as needed for nausea or vomiting., Disp: 20 tablet, Rfl: 0 .  pyridOXINE (VITAMIN  B-6) 50 MG tablet, Take 50 mg by mouth daily., Disp: , Rfl:  .  sertraline (ZOLOFT) 100 MG tablet, Take 1 tablet (100 mg total) by mouth daily., Disp: 15 tablet, Rfl: 0 .  traZODone (DESYREL) 50 MG tablet, Take 50 mg by mouth at bedtime., Disp: , Rfl:  .  zinc gluconate 50 MG tablet, Take 50 mg by mouth daily., Disp: , Rfl:   Review of Systems  Constitutional: Positive for fatigue and fever.  Respiratory: Positive for cough and shortness of breath.   Cardiovascular: Negative.   Gastrointestinal: Positive for diarrhea and vomiting.  Musculoskeletal: Negative.   Neurological: Positive for dizziness.    Social History   Tobacco Use  . Smoking status:  Never Smoker  . Smokeless tobacco: Never Used  Substance Use Topics  . Alcohol use: No    Alcohol/week: 0.0 standard drinks    Comment: Former drinker      Objective:   There were no vitals taken for this visit. There were no vitals filed for this visit.There is no height or weight on file to calculate BMI.   Physical Exam   No results found for any visits on 05/22/19.     Assessment & Plan    I discussed the assessment and treatment plan with the patient. The patient was provided an opportunity to ask questions and all were answered. The patient agreed with the plan and demonstrated an understanding of the instructions.   The patient was advised to call back or seek an in-person evaluation if the symptoms worsen or if the condition fails to improve as anticipated.  I provided *** minutes of non-face-to-face time during this encounter.      Lelon Huh, MD  [facility]

## 2019-06-10 NOTE — Progress Notes (Signed)
No show

## 2019-06-12 ENCOUNTER — Other Ambulatory Visit: Payer: Self-pay | Admitting: Family Medicine

## 2019-06-12 DIAGNOSIS — E119 Type 2 diabetes mellitus without complications: Secondary | ICD-10-CM

## 2019-06-12 NOTE — Telephone Encounter (Signed)
Forwarding medication refill request to provider for review. 

## 2019-06-12 NOTE — Telephone Encounter (Signed)
Medication Refill - Medication: allopurinol (ZYLOPRIM) 100 MG tablet/Insulin Glargine (BASAGLAR KWIKPEN) 100 UNIT/ML SOPN  Pt is out of allopurinol and down to half a pen for insulin  Has the patient contacted their pharmacy? Yes.   (Agent: If no, request that the patient contact the pharmacy for the refill.) (Agent: If yes, when and what did the pharmacy advise?)Contact PCP  Preferred Pharmacy (with phone number or street name[facility] [place] [place] [facility] [phone] (Phone) [phone] (Fax)     Agent: Please be advised that RX refills may take up to 3 business days. We ask that you follow-up with your pharmacy.

## 2019-06-15 DIAGNOSIS — G8929 Other chronic pain: Secondary | ICD-10-CM | POA: Diagnosis not present

## 2019-06-15 DIAGNOSIS — Z794 Long term (current) use of insulin: Secondary | ICD-10-CM | POA: Diagnosis not present

## 2019-06-15 DIAGNOSIS — R69 Illness, unspecified: Secondary | ICD-10-CM | POA: Diagnosis not present

## 2019-06-15 DIAGNOSIS — M109 Gout, unspecified: Secondary | ICD-10-CM | POA: Diagnosis not present

## 2019-06-15 DIAGNOSIS — Z9181 History of falling: Secondary | ICD-10-CM | POA: Diagnosis not present

## 2019-06-15 DIAGNOSIS — E1142 Type 2 diabetes mellitus with diabetic polyneuropathy: Secondary | ICD-10-CM | POA: Diagnosis not present

## 2019-06-15 DIAGNOSIS — N529 Male erectile dysfunction, unspecified: Secondary | ICD-10-CM | POA: Diagnosis not present

## 2019-06-15 DIAGNOSIS — K219 Gastro-esophageal reflux disease without esophagitis: Secondary | ICD-10-CM | POA: Diagnosis not present

## 2019-06-15 DIAGNOSIS — I10 Essential (primary) hypertension: Secondary | ICD-10-CM | POA: Diagnosis not present

## 2019-06-15 MED ORDER — BASAGLAR KWIKPEN 100 UNIT/ML ~~LOC~~ SOPN: PEN_INJECTOR | SUBCUTANEOUS | 2 refills | Status: DC

## 2019-06-15 MED ORDER — ALLOPURINOL 100 MG PO TABS: 100.0000 mg | ORAL_TABLET | Freq: Every day | ORAL | 3 refills | Status: DC

## 2019-06-15 NOTE — Telephone Encounter (Signed)
Patient's wife Freida Busman is requesting refills be sent to [facility] ASAP. Patient is almost out of Engineer, agricultural.

## 2019-06-23 ENCOUNTER — Ambulatory Visit: Payer: Medicare HMO | Admitting: Family Medicine

## 2019-06-23 NOTE — Progress Notes (Deleted)
Patient: [name] Male    DOB: July 17, 1950   68 y.o.   MRN: [id number] Visit Date: 06/23/2019  Today's Provider: Lelon Huh, MD   Chief Complaint  Patient presents with  . Diabetes  . Hypertension   Subjective:     HPI  Hypertension, follow-up:  BP Readings from Last 3 Encounters:  01/19/19 (!) 142/95  09/15/18 (!) 140/100  06/12/18 136/82    He was last seen for hypertension 5 months ago.  BP at that visit was 142/95. Management changes since that visit include none. He reports excellent compliance with treatment. He is not having side effects.  He is exercising. He {is/is not:9024} adherent to low salt diet.   Outside blood pressures are ***. He is experiencing {Symptoms; cardiac:12860}.  Patient denies {Symptoms; cardiac:12860}.   Cardiovascular risk factors include {cv risk factors:510}.  Use of agents associated with hypertension: {bp agents assoc with hypertension:511::"none"}.     Weight trend: {trend:16658} Wt Readings from Last 3 Encounters:  01/19/19 153 lb (69.4 kg)  09/15/18 152 lb 12.8 oz (69.3 kg)  06/12/18 158 lb (71.7 kg)    Current diet: {diet habits:16563}  ------------------------------------------------------------------------  Diabetes Mellitus Type II, Follow-up:   Lab Results  Component Value Date   HGBA1C 8.8 (A) 01/19/2019   HGBA1C 6.6 (A) 09/15/2018   HGBA1C 7.1 (A) 06/12/2018    Last seen for diabetes 5 months ago.  Management since then includes ***. He reports excellent compliance with treatment. He is not having side effects.  Current symptoms include {Symptoms; diabetes:14075} and have been {Desc; course:15616}. Home blood sugar records: {diabetes glucometry results:16657}  Episodes of hypoglycemia? {yes***/no:17258}   Current insulin regiment: Basaglar 16 units Novolog 6-10 units Most Recent Eye Exam: 08/12/2018 Weight trend: stable Prior visit with dietician: No Current exercise: walking Current  diet habits: in general, a "healthy" diet    Pertinent Labs:    Component Value Date/Time   CHOL 115 09/15/2018 0933   CHOL 92 09/07/2012 0406   TRIG 68 09/15/2018 0933   TRIG 116 09/07/2012 0406   HDL 52 09/15/2018 0933   HDL 21 (L) 09/07/2012 0406   LDLCALC 49 09/15/2018 0933   LDLCALC 67 06/10/2017 1110   LDLCALC 48 09/07/2012 0406   CREATININE 0.70 (L) 01/19/2019 0943   CREATININE 0.81 06/10/2017 1110    Wt Readings from Last 3 Encounters:  01/19/19 153 lb (69.4 kg)  09/15/18 152 lb 12.8 oz (69.3 kg)  06/12/18 158 lb (71.7 kg)    ------------------------------------------------------------------------   Allergies  Allergen Reactions  . Hydrochlorothiazide Other (See Comments)    Reaction:  Hyponatremia     Current Outpatient Medications:  .  ACCU-CHEK FASTCLIX LANCETS MISC, USE TO CHECK BLOOD SUGAR 3 TIMES A DAY, Disp: 306 each, Rfl: 4 .  allopurinol (ZYLOPRIM) 100 MG tablet, Take 1 tablet (100 mg total) by mouth daily., Disp: 90 tablet, Rfl: 3 .  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  Calcium Citrate 200 MG TABS, Take 200 mg by mouth daily. Reported on 11/21/2015, Disp: , Rfl:  .  cyclobenzaprine (FLEXERIL) 5 MG tablet, Take 1 tablet (5 mg total) by mouth 3 (three) times daily as needed for muscle spasms., Disp: 30 tablet, Rfl: 1 .  gabapentin (NEURONTIN) 100 MG capsule, One at night for 3 nights, then one twice a  day for 3 days, then one three times daily, Disp: 270 capsule, Rfl: 3 .  glucose blood (ACCU-CHEK AVIVA PLUS) test strip, Use as instructed, Disp: 360 each, Rfl: 4 .  Insulin Glargine (BASAGLAR KWIKPEN) 100 UNIT/ML SOPN, INJECT UP TO 20 UNITS      SUBCUTANEOUSLY AT BEDTIME, Disp: 30 mL, Rfl: 2 .  Insulin Pen Needle (PEN NEEDLES) 31G X 5 MM MISC, 1 Device by Does not apply route 3 (three) times daily., Disp: 100 each,  Rfl: 4 .  lisinopril (ZESTRIL) 10 MG tablet, TAKE 1 TABLET BY MOUTH EVERY DAY, Disp: 90 tablet, Rfl: 4 .  magnesium gluconate (MAGONATE) 500 MG tablet, Take 500 mg by mouth daily. , Disp: , Rfl:  .  Multiple Vitamin (MULTIVITAMIN WITH MINERALS) TABS tablet, Take 1 tablet by mouth daily., Disp: , Rfl:  .  naltrexone (DEPADE) 50 MG tablet, Take 1 tablet (50 mg total) by mouth daily., Disp: 90 tablet, Rfl: 3 .  NOVOLOG FLEXPEN 100 UNIT/ML FlexPen, INJECT UP TO 10 UNITS 3    TIMES A DAY BEFORE MEALS, Disp: 15 mL, Rfl: 3 .  omeprazole (PRILOSEC) 40 MG capsule, TAKE 1 CAPSULE BY MOUTH EVERY DAY, Disp: 90 capsule, Rfl: 4 .  Potassium Gluconate 550 MG TABS, Take 550 mg by mouth daily. , Disp: , Rfl:  .  promethazine (PHENERGAN) 25 MG tablet, Take 1 tablet (25 mg total) by mouth every 8 (eight) hours as needed for nausea or vomiting., Disp: 20 tablet, Rfl: 0 .  pyridOXINE (VITAMIN B-6) 50 MG tablet, Take 50 mg by mouth daily., Disp: , Rfl:  .  sertraline (ZOLOFT) 100 MG tablet, Take 1 tablet (100 mg total) by mouth daily., Disp: 15 tablet, Rfl: 0 .  traZODone (DESYREL) 50 MG tablet, Take 50 mg by mouth at bedtime., Disp: , Rfl:  .  zinc gluconate 50 MG tablet, Take 50 mg by mouth daily., Disp: , Rfl:   Review of Systems  Social History   Tobacco Use  . Smoking status: Never Smoker  . Smokeless tobacco: Never Used  Substance Use Topics  . Alcohol use: No    Alcohol/week: 0.0 standard drinks    Comment: Former drinker      Objective:   There were no vitals taken for this visit. There were no vitals filed for this visit.There is no height or weight on file to calculate BMI.   Physical Exam   No results found for any visits on 06/23/19.     Assessment & Plan        Lelon Huh, MD  [facility]

## 2019-07-01 ENCOUNTER — Other Ambulatory Visit: Payer: Self-pay

## 2019-07-01 DIAGNOSIS — E119 Type 2 diabetes mellitus without complications: Secondary | ICD-10-CM

## 2019-07-01 MED ORDER — BASAGLAR KWIKPEN 100 UNIT/ML ~~LOC~~ SOPN: PEN_INJECTOR | SUBCUTANEOUS | 0 refills | Status: DC

## 2019-07-01 MED ORDER — NOVOLOG FLEXPEN 100 UNIT/ML ~~LOC~~ SOPN: PEN_INJECTOR | SUBCUTANEOUS | 3 refills | Status: DC

## 2019-07-01 MED ORDER — BASAGLAR KWIKPEN 100 UNIT/ML ~~LOC~~ SOPN: PEN_INJECTOR | SUBCUTANEOUS | 3 refills | Status: DC

## 2019-07-01 NOTE — Telephone Encounter (Signed)
[phone] this is the number for the fast med. Novalog and Basaglar  Pt is requesting an emergency supply at  [id number] - [address], [address]  [place] [address]  Phone: [phone] Fax: [phone]

## 2019-07-01 NOTE — Telephone Encounter (Signed)
[place] supply of Basaglar sent to [facility] as requested. I also sent resent a 3 month supply of Basaglar and Novolog to [facility] mail [facility] because pharmacy said they never received the prescription.

## 2019-07-01 NOTE — Telephone Encounter (Signed)
From [facility] 

## 2019-07-01 NOTE — Addendum Note (Signed)
Addended by: Randal Buba on: 07/01/2019 04:10 PM   Modules accepted: Orders

## 2019-07-27 ENCOUNTER — Encounter: Payer: Self-pay | Admitting: Family Medicine

## 2019-07-27 ENCOUNTER — Other Ambulatory Visit: Payer: Self-pay

## 2019-07-27 ENCOUNTER — Ambulatory Visit (INDEPENDENT_AMBULATORY_CARE_PROVIDER_SITE_OTHER): Payer: Medicare HMO | Admitting: Family Medicine

## 2019-07-27 VITALS — BP 118/81 | HR 84 | Temp 97.1°F | Resp 18 | Ht 65.0 in | Wt 166.6 lb

## 2019-07-27 DIAGNOSIS — K219 Gastro-esophageal reflux disease without esophagitis: Secondary | ICD-10-CM

## 2019-07-27 DIAGNOSIS — Z794 Long term (current) use of insulin: Secondary | ICD-10-CM

## 2019-07-27 DIAGNOSIS — R1900 Intra-abdominal and pelvic swelling, mass and lump, unspecified site: Secondary | ICD-10-CM | POA: Diagnosis not present

## 2019-07-27 DIAGNOSIS — E119 Type 2 diabetes mellitus without complications: Secondary | ICD-10-CM | POA: Diagnosis not present

## 2019-07-27 LAB — POCT GLYCOSYLATED HEMOGLOBIN (HGB A1C)
Est. average glucose Bld gHb Est-mCnc: 157
Hemoglobin A1C: 7.1 % — AB (ref 4.0–5.6)

## 2019-07-27 MED ORDER — OMEPRAZOLE 40 MG PO CPDR: DELAYED_RELEASE_CAPSULE | ORAL | 4 refills | Status: DC

## 2019-07-27 NOTE — Patient Instructions (Signed)
.   Please review the attached list of medications and notify my office if there are any errors.   . Please bring all of your medications to every appointment so we can make sure that our medication list is the same as yours.    Check with your insurance formulary to see if there is a preferred brand of insulin that is preferred. You can use the sample of Tresiba in place of [place] for now.

## 2019-07-27 NOTE — Progress Notes (Signed)
Patient: [name] Male    DOB: October 05, 1950   69 y.o.   MRN: [id number] Visit Date: 07/27/2019  Today's Provider: Lelon Huh, MD   Chief Complaint  Patient presents with  . Follow-up  . Diabetes  . Hypertension   Subjective:     HPI    Diabetes Mellitus Type II, Follow-up:   Lab Results  Component Value Date   HGBA1C 7.1 (A) 07/27/2019   HGBA1C 8.8 (A) 01/19/2019   HGBA1C 6.6 (A) 09/15/2018   Last seen for diabetes 6 months ago.  Management since then includes Basiglar, Humalog. He reports good compliance with treatment. He is not having side effects.  Current symptoms include none and have been stable. Home blood sugar records: fasting range: 120-250  Episodes of hypoglycemia? no   Current Insulin Regimen: Basiglar, Novalog   ------------------------------------------------------------------------   Hypertension, follow-up:  BP Readings from Last 3 Encounters:  07/27/19 118/81  01/19/19 (!) 142/95  09/15/18 (!) 140/100    He was last seen for hypertension 6 months ago.  BP at that visit was 142/95. Management since that visit includes medication .He reports good compliance with treatment. He is not having side effects.  He is exercising. He is not adherent to low salt diet.   Outside blood pressures are 130/90 He is experiencing none.  Patient denies chest pain, chest pressure/discomfort, claudication, dyspnea, exertional chest pressure/discomfort, fatigue, irregular heart beat, lower extremity edema, near-syncope, orthopnea, palpitations, paroxysmal nocturnal dyspnea, syncope and tachypnea.   Cardiovascular risk factors include advanced age (older than 42 for men, 10 for women).  Use of agents associated with hypertension: none.   ------------------------------------------------------------------------  His primary complaint today is abdominal swelling for the last month which is slowing getting worse. Is not painful at all. No masses, no  change in bowel or bladder habits. He is alcoholic and had been drinking since last office visit, but now states he has had no alcohol for the last 2-3 weeks.   Allergies  Allergen Reactions  . Hydrochlorothiazide Other (See Comments)    Reaction:  Hyponatremia     Current Outpatient Medications:  .  ACCU-CHEK FASTCLIX LANCETS MISC, USE TO CHECK BLOOD SUGAR 3 TIMES A DAY, Disp: 306 each, Rfl: 4 .  allopurinol (ZYLOPRIM) 100 MG tablet, Take 1 tablet (100 mg total) by mouth daily., Disp: 90 tablet, Rfl: 3 .  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  Calcium Citrate 200 MG TABS, Take 200 mg by mouth daily. Reported on 11/21/2015, Disp: , Rfl:  .  gabapentin (NEURONTIN) 100 MG capsule, One at night for 3 nights, then one twice a day for 3 days, then one three times daily, Disp: 270 capsule, Rfl: 3 .  glucose blood (ACCU-CHEK AVIVA PLUS) test strip, Use as instructed, Disp: 360 each, Rfl: 4 .  insulin aspart (NOVOLOG FLEXPEN) 100 UNIT/ML FlexPen, INJECT UP TO 10 UNITS 3    TIMES A DAY BEFORE MEALS, Disp: 15 mL, Rfl: 3 .  Insulin Glargine (BASAGLAR KWIKPEN) 100 UNIT/ML SOPN, INJECT UP TO 20 UNITS      SUBCUTANEOUSLY AT BEDTIME, Disp: 30 mL, Rfl: 3 .  Insulin Pen Needle (PEN NEEDLES) 31G X 5 MM MISC, 1 Device by Does not apply route 3 (three) times daily., Disp: 100 each, Rfl: 4 .  lisinopril (ZESTRIL) 10 MG tablet, TAKE  1 TABLET BY MOUTH EVERY DAY, Disp: 90 tablet, Rfl: 4 .  magnesium gluconate (MAGONATE) 500 MG tablet, Take 500 mg by mouth daily. , Disp: , Rfl:  .  Multiple Vitamin (MULTIVITAMIN WITH MINERALS) TABS tablet, Take 1 tablet by mouth daily., Disp: , Rfl:  .  naltrexone (DEPADE) 50 MG tablet, Take 1 tablet (50 mg total) by mouth daily., Disp: 90 tablet, Rfl: 3 .  omeprazole (PRILOSEC) 40 MG capsule, TAKE 1 CAPSULE BY MOUTH EVERY DAY, Disp: 90 capsule,  Rfl: 4 .  Potassium Gluconate 550 MG TABS, Take 550 mg by mouth daily. , Disp: , Rfl:  .  promethazine (PHENERGAN) 25 MG tablet, Take 1 tablet (25 mg total) by mouth every 8 (eight) hours as needed for nausea or vomiting., Disp: 20 tablet, Rfl: 0 .  pyridOXINE (VITAMIN B-6) 50 MG tablet, Take 50 mg by mouth daily., Disp: , Rfl:  .  sertraline (ZOLOFT) 100 MG tablet, Take 1 tablet (100 mg total) by mouth daily., Disp: 15 tablet, Rfl: 0 .  zinc gluconate 50 MG tablet, Take 50 mg by mouth daily., Disp: , Rfl:  .  cyclobenzaprine (FLEXERIL) 5 MG tablet, Take 1 tablet (5 mg total) by mouth 3 (three) times daily as needed for muscle spasms. (Patient not taking: Reported on 07/27/2019), Disp: 30 tablet, Rfl: 1 .  traZODone (DESYREL) 50 MG tablet, Take 50 mg by mouth at bedtime., Disp: , Rfl:   Review of Systems  Constitutional: Negative for appetite change, chills and fever.  Respiratory: Negative for chest tightness, shortness of breath and wheezing.   Cardiovascular: Negative for chest pain and palpitations.  Gastrointestinal: Negative for abdominal pain, nausea and vomiting.    Social History   Tobacco Use  . Smoking status: Never Smoker  . Smokeless tobacco: Never Used  Substance Use Topics  . Alcohol use: No    Alcohol/week: 0.0 standard drinks    Comment: Former drinker      Objective:   BP 118/81   Pulse 84   Temp (!) 97.1 F (36.2 C) (Temporal)   Resp 18   Ht 5' 5"  (1.651 m)   Wt 166 lb 9.6 oz (75.6 kg)   SpO2 97%   BMI 27.72 kg/m  Vitals:   07/27/19 0840  BP: 118/81  Pulse: 84  Resp: 18  Temp: (!) 97.1 F (36.2 C)  TempSrc: Temporal  SpO2: 97%  Weight: 166 lb 9.6 oz (75.6 kg)  Height: 5' 5"  (1.651 m)  Body mass index is 27.72 kg/m.   Physical Exam   General Appearance:    Well developed, well nourished male in no acute distress  Eyes:    PERRL, conjunctiva/corneas clear, EOM's intact       Lungs:     Clear to auscultation bilaterally, respirations unlabored   Heart:    Normal heart rate. Normal rhythm. No murmurs, rubs, or gallops.   Abd:   Non-tender, moderately distended. No discrete masses  Neurologic:   Awake, alert, oriented x 3. No apparent focal neurological           defect.        Results for orders placed or performed in visit on 07/27/19  POCT HgB A1C  Result Value Ref Range   Hemoglobin A1C 7.1 (A) 4.0 - 5.6 %   Est. average glucose Bld gHb Est-mCnc 157        Assessment & Plan    1. Abdominal swelling Likely ascites secondary to chronic liver disease. Strongly  advised to completely stop all alcohol consumption.  - US Abdomen Complete; Future - Comprehensive metabolic panel - CBC  2. Type 2 diabetes mellitus without complication, with long-term current use of insulin (HCC) Well controlled.  Continue current medications.   Patient is out of Basaglar and is too expensive. He was instructed to check his insurance formulary for preferred alternatives. Given one sample Pen of Tresiba to take in it's place.   3. Gastroesophageal reflux disease without esophagitis refill- omeprazole (PRILOSEC) 40 MG capsule; TAKE 1 CAPSULE BY MOUTH EVERY DAY  Dispense: 90 capsule; Refill: 4  The entirety of the information documented in the History of Present Illness, Review of Systems and Physical Exam were personally obtained by me. Portions of this information were initially documented by [facility], CMA and reviewed by me for thoroughness and accuracy.      Lelon Huh, MD  [facility]

## 2019-07-28 ENCOUNTER — Telehealth: Payer: Self-pay

## 2019-07-28 LAB — COMPREHENSIVE METABOLIC PANEL
ALT: 25 IU/L (ref 0–44)
AST: 35 IU/L (ref 0–40)
Albumin/Globulin Ratio: 1.2 (ref 1.2–2.2)
Albumin: 3.3 g/dL — ABNORMAL LOW (ref 3.8–4.8)
Alkaline Phosphatase: 130 IU/L — ABNORMAL HIGH (ref 39–117)
BUN/Creatinine Ratio: 5 — ABNORMAL LOW (ref 10–24)
BUN: 3 mg/dL — ABNORMAL LOW (ref 8–27)
Bilirubin Total: 0.8 mg/dL (ref 0.0–1.2)
CO2: 27 mmol/L (ref 20–29)
Calcium: 8.9 mg/dL (ref 8.6–10.2)
Chloride: 96 mmol/L (ref 96–106)
Creatinine, Ser: 0.57 mg/dL — ABNORMAL LOW (ref 0.76–1.27)
GFR calc Af Amer: 122 mL/min/{1.73_m2} (ref >59)
GFR calc non Af Amer: 106 mL/min/{1.73_m2} (ref >59)
Globulin, Total: 2.7 g/dL (ref 1.5–4.5)
Glucose: 155 mg/dL — ABNORMAL HIGH (ref 65–99)
Potassium: 4.2 mmol/L (ref 3.5–5.2)
Sodium: 134 mmol/L (ref 134–144)
Total Protein: 6 g/dL (ref 6.0–8.5)

## 2019-07-28 LAB — CBC
Hematocrit: 38.9 % (ref 37.5–51.0)
Hemoglobin: 12.6 g/dL — ABNORMAL LOW (ref 13.0–17.7)
MCH: 30.9 pg (ref 26.6–33.0)
MCHC: 32.4 g/dL (ref 31.5–35.7)
MCV: 95 fL (ref 79–97)
Platelets: 217 10*3/uL (ref 150–450)
RBC: 4.08 x10E6/uL — ABNORMAL LOW (ref 4.14–5.80)
RDW: 16.1 % — ABNORMAL HIGH (ref 11.6–15.4)
WBC: 5.5 10*3/uL (ref 3.4–10.8)

## 2019-07-28 NOTE — Telephone Encounter (Signed)
Patient's wife advised and verbally voiced understanding. Please schedule ultrasound.

## 2019-07-28 NOTE — Telephone Encounter (Signed)
-----   Message from Birdie Sons, MD sent at 07/28/2019  3:51 PM EST ----- Labs are normal. Need to go ahead and proceed with ultrasound. He should be hearing from sarah to schedule this soon. Call if he doesn't hear from her by tomorrow.

## 2019-07-31 ENCOUNTER — Other Ambulatory Visit: Payer: Self-pay

## 2019-07-31 ENCOUNTER — Ambulatory Visit
Admission: RE | Admit: 2019-07-31 | Discharge: 2019-07-31 | Disposition: A | Discharge status: Home / Self Care | Payer: Medicare HMO | Source: Ambulatory Visit | Attending: Family Medicine | Admitting: Family Medicine

## 2019-07-31 ENCOUNTER — Other Ambulatory Visit: Payer: Self-pay | Admitting: Family Medicine

## 2019-07-31 ENCOUNTER — Telehealth: Payer: Self-pay

## 2019-07-31 DIAGNOSIS — R1900 Intra-abdominal and pelvic swelling, mass and lump, unspecified site: Secondary | ICD-10-CM | POA: Insufficient documentation

## 2019-07-31 DIAGNOSIS — K7031 Alcoholic cirrhosis of liver with ascites: Secondary | ICD-10-CM

## 2019-07-31 MED ORDER — SERTRALINE HCL 100 MG PO TABS: 100.0000 mg | ORAL_TABLET | Freq: Every day | ORAL | 1 refills | Status: DC

## 2019-07-31 MED ORDER — SPIRONOLACTONE 25 MG PO TABS: 25.0000 mg | ORAL_TABLET | Freq: Every day | ORAL | 1 refills | Status: DC

## 2019-07-31 NOTE — Telephone Encounter (Signed)
-----   Message from Birdie Sons, MD sent at 07/31/2019  3:46 PM EST ----- Ultrasound shows fluid buildup of fluid due to liver disease. Needs to see GI ASAP. Have entered referral.  Also, need to [place], and start spironolactone to help get fluid off abdomen.  Have sent prescription to his pharmacy.

## 2019-07-31 NOTE — Telephone Encounter (Signed)
Patient advised and verbally voiced understanding. Patient agrees to referral please schedule.

## 2019-08-06 ENCOUNTER — Encounter: Payer: Self-pay | Admitting: Family Medicine

## 2019-08-06 DIAGNOSIS — E119 Type 2 diabetes mellitus without complications: Secondary | ICD-10-CM

## 2019-08-07 ENCOUNTER — Ambulatory Visit: Payer: Self-pay

## 2019-08-07 ENCOUNTER — Telehealth: Payer: Self-pay | Admitting: Family Medicine

## 2019-08-07 DIAGNOSIS — K7031 Alcoholic cirrhosis of liver with ascites: Secondary | ICD-10-CM

## 2019-08-07 MED ORDER — NOVOLOG FLEXPEN 100 UNIT/ML ~~LOC~~ SOPN: PEN_INJECTOR | SUBCUTANEOUS | 3 refills | Status: DC

## 2019-08-07 MED ORDER — TRESIBA FLEXTOUCH 100 UNIT/ML ~~LOC~~ SOPN: PEN_INJECTOR | SUBCUTANEOUS | 4 refills | Status: DC

## 2019-08-07 NOTE — Telephone Encounter (Signed)
Incoming call from Patient with complaint of lower abdominal pain gaining wt.  Patient states he looks like he is 9 months pregnant.  Pain radiates to back  Onset about 3 weeks ago. Sudden onset states pain is constant. Rate pain as severe.  Patient Would like something to relieve to pressure.  Reports he goes to bathroom 12 to 15 times per day Voids very little has soft stools.  Would like something to help him release the pressure in abdomin. Patient awaits return phone call from Dr. Caryn Section.

## 2019-08-07 NOTE — Telephone Encounter (Signed)
Incoming call from Patient.  With a complaint of       Answer Assessment - Initial Assessment Questions 1. LOCATION: "Where does it hurt?"   lower stomach 2. RADIATION: "Does the pain shoot anywhere else?" (e.g., chest, back)   Lower back hurts also 3. ONSET: "When did the pain begin?" (Minutes, hours or days ago)    3 weeks ago 4. SUDDEN: "Gradual or sudden onset?"   suddened 5. PATTERN "Does the pain come and go, or is it constant?"    - If constant: "Is it getting better, staying the same, or worsening?"      (Note: Constant means the pain never goes away completely; most serious pain is constant and it progresses)     - If intermittent: "How long does it last?" "Do you have pain now?"     (Note: Intermittent means the pain goes away completely between bouts)     constant 6. SEVERITY: "How bad is the pain?"  (e.g., Scale 1-10; mild, moderate, or severe)    - MILD (1-3): doesn't interfere with normal activities, abdomen soft and not tender to touch     - MODERATE (4-7): interferes with normal activities or awakens from sleep, tender to touch     - SEVERE (8-10): excruciating pain, doubled over, unable to do any normal activities     sever 7. RECURRENT SYMPTOM: "Have you ever had this type of abdominal pain before?" If so, ask: "When was the last time?" and "What happened that time?"      yes 8. CAUSE: "What do you think is causing the abdominal pain?"      9. RELIEVING/AGGRAVATING FACTORS: "What makes it better or worse?" (e.g., movement, antacids, bowel movement)     10. OTHER SYMPTOMS: "Has there been any vomiting, diarrhea, constipation, or urine problems?"      Gaining weight  Protocols used: ABDOMINAL PAIN - MALE-A-AH

## 2019-08-07 NOTE — Telephone Encounter (Signed)
If spirolactone is not working then will need to proceed with paracentesis to draw off fluid with a needle. Has to be scheduled in imaging department. Have entered order and he should get a call to schedule by Monday.

## 2019-08-07 NOTE — Addendum Note (Signed)
Addended by: Birdie Sons on: 08/07/2019 03:19 PM   Modules accepted: Orders

## 2019-08-07 NOTE — Telephone Encounter (Signed)
Patient's wife [name] advised and agrees to proceed with procedure. Please schedule.

## 2019-08-07 NOTE — Telephone Encounter (Signed)
Patient is calling to state that he received a  No show charge on 06/22/20- However, he did show up. Patient has already spoken to billing. The bill has gone to collections. Please advise Cb- [phone]

## 2019-08-10 ENCOUNTER — Ambulatory Visit
Admission: RE | Admit: 2019-08-10 | Discharge: 2019-08-10 | Disposition: A | Discharge status: Home / Self Care | Payer: Medicare HMO | Source: Ambulatory Visit | Attending: Family Medicine | Admitting: Family Medicine

## 2019-08-10 ENCOUNTER — Other Ambulatory Visit: Payer: Self-pay

## 2019-08-10 DIAGNOSIS — R69 Illness, unspecified: Secondary | ICD-10-CM | POA: Diagnosis not present

## 2019-08-10 DIAGNOSIS — K769 Liver disease, unspecified: Secondary | ICD-10-CM | POA: Diagnosis not present

## 2019-08-10 DIAGNOSIS — R188 Other ascites: Secondary | ICD-10-CM | POA: Diagnosis not present

## 2019-08-10 DIAGNOSIS — K7031 Alcoholic cirrhosis of liver with ascites: Secondary | ICD-10-CM | POA: Diagnosis not present

## 2019-08-11 ENCOUNTER — Other Ambulatory Visit: Payer: Self-pay

## 2019-08-12 ENCOUNTER — Other Ambulatory Visit
Admission: RE | Admit: 2019-08-12 | Discharge: 2019-08-12 | Disposition: A | Discharge status: Home / Self Care | Payer: Medicare HMO | Attending: Gastroenterology | Admitting: Gastroenterology

## 2019-08-12 ENCOUNTER — Other Ambulatory Visit: Payer: Self-pay

## 2019-08-12 ENCOUNTER — Ambulatory Visit (INDEPENDENT_AMBULATORY_CARE_PROVIDER_SITE_OTHER): Payer: Medicare HMO | Admitting: Gastroenterology

## 2019-08-12 ENCOUNTER — Encounter: Payer: Self-pay | Admitting: Gastroenterology

## 2019-08-12 VITALS — BP 137/87 | HR 97 | Temp 97.4°F | Ht 65.0 in | Wt 172.2 lb

## 2019-08-12 DIAGNOSIS — K7460 Unspecified cirrhosis of liver: Secondary | ICD-10-CM

## 2019-08-12 DIAGNOSIS — Z13810 Encounter for screening for upper gastrointestinal disorder: Secondary | ICD-10-CM

## 2019-08-12 LAB — BASIC METABOLIC PANEL
Anion gap: 9 (ref 5–15)
BUN: 9 mg/dL (ref 8–23)
CO2: 28 mmol/L (ref 22–32)
Calcium: 8.3 mg/dL — ABNORMAL LOW (ref 8.9–10.3)
Chloride: 98 mmol/L (ref 98–111)
Creatinine, Ser: 0.52 mg/dL — ABNORMAL LOW (ref 0.61–1.24)
GFR calc Af Amer: >60 mL/min (ref >60)
GFR calc non Af Amer: >60 mL/min (ref >60)
Glucose, Bld: 71 mg/dL (ref 70–99)
Potassium: 4 mmol/L (ref 3.5–5.1)
Sodium: 135 mmol/L (ref 135–145)

## 2019-08-12 MED ORDER — FUROSEMIDE 40 MG PO TABS: 40.0000 mg | ORAL_TABLET | Freq: Every day | ORAL | 3 refills | Status: DC

## 2019-08-12 NOTE — H&P (View-Only) (Signed)
Gastroenterology Consultation  Referring Provider:     Birdie Sons, MD Primary Care Physician:  Birdie Sons, MD Primary Gastroenterologist:  Dr. Allen Norris     Reason for Consultation:     Cirrhosis        HPI:   [name] is a 69 y.o. y/o male referred for consultation & management of cirrhosis by Dr. Caryn Section, [name], MD.  This patient was sent to me by his primary care provider after being seen by Dr. Fuller Plan in [place] in 2002 and then by Dr. Vira Agar and Dr. Gustavo Lah from 2014 until 2017 at the [facility] clinic.  The patient had colonoscopies in the past with adenomatous polyps found.  The patient's last colonoscopy was 2017.  He also was seen in 2015 at [facility] by gastroenterology.  The patient has a history of splenic vein thrombosis with perigastric varices but an upper endoscopy in 2015 did not show any esophageal or gastric varices.  The patient had a right upper quadrant ultrasound in January of this year that showed:  IMPRESSION: Changes consistent with hepatic cirrhosis and moderate ascites. Associated splenomegaly is noted.  The patient then underwent a paracentesis for his ascites.  The patient has had a isolated increased alkaline phosphatase but his other liver enzymes are normal.  The patient's alpha-fetoprotein was also normal. The patient reports that he had been drinking for many years and quit for about a year and a half.  The patient went back to heavy drinking and only stopped 5 weeks ago.  The patient also reports that after having his paracentesis 2 days ago his fluid is returning.  The patient was started on Aldactone 25 mg and has not noticed much of a difference.  The patient has no abdominal pain nausea vomiting fevers or chills.     Past Medical History:  Diagnosis Date  . Alcoholism ([place])   . 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 Elevated liver enzymes   . Esophageal varices ([place])   . Gastritis   . Pancreatitis     Past Surgical History:  Procedure  Laterality Date  . CHOLECYSTECTOMY  2006  . COLONOSCOPY WITH ESOPHAGOGASTRODUODENOSCOPY (EGD)    . COLONOSCOPY WITH PROPOFOL N/A 11/21/2015   Procedure: COLONOSCOPY WITH PROPOFOL;  Surgeon: Manya Silvas, MD;  Location: [facility] ENDOSCOPY;  Service: Endoscopy;  Laterality: N/A;  . HERNIA REPAIR     Inguinal Hernia Repai. Left: 07/28/2013 Dr. [name]; 2nd repair done 04/30/2014  . TONSILLECTOMY      Prior to Admission medications   Medication Sig Start Date End Date Taking? Authorizing Provider  ACCU-CHEK FASTCLIX LANCETS MISC USE TO CHECK BLOOD SUGAR 3 TIMES A DAY 07/13/18   Birdie Sons, MD  allopurinol (ZYLOPRIM) 100 MG tablet Take 1 tablet (100 mg total) by mouth daily. 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 Reported on 11/21/2015    [provider]  cyclobenzaprine (FLEXERIL) 5 MG tablet Take 1 tablet (5 mg total) by mouth 3 (three) times daily as needed for muscle spasms. Patient not taking: Reported on 07/27/2019 09/15/18   Birdie Sons, MD  gabapentin (NEURONTIN) 100 MG capsule One at night for 3 nights, then one twice a day for 3 days,  then one three times daily 10/03/17   Fisher, Donald E, MD  glucose blood (ACCU-CHEK AVIVA PLUS) test strip Use as instructed 10/06/17   Fisher, Donald E, MD  insulin aspart (NOVOLOG FLEXPEN) 100 UNIT/ML FlexPen INJECT UP TO 10 UNITS 3    TIMES A DAY BEFORE MEALS 08/07/19   Fisher, Donald E, MD  insulin degludec (TRESIBA FLEXTOUCH) 100 UNIT/ML SOPN FlexTouch Pen Inject up to 20 units every day at bedtime 08/07/19   Fisher, Donald E, MD  Insulin Pen Needle (PEN NEEDLES) 31G X 5 MM MISC 1 Device by Does not apply route 3 (three) times daily. 03/04/18   Fisher, Donald E, MD  lisinopril (ZESTRIL) 10 MG tablet TAKE 1 TABLET  BY MOUTH EVERY DAY 03/18/19   Fisher, Donald E, MD  magnesium gluconate (MAGONATE) 500 MG tablet Take 500 mg by mouth daily.     [provider]  Multiple Vitamin (MULTIVITAMIN WITH MINERALS) TABS tablet Take 1 tablet by mouth daily.    [provider]  naltrexone (DEPADE) 50 MG tablet Take 1 tablet (50 mg total) by mouth daily. 02/10/18   Fisher, Donald E, MD  omeprazole (PRILOSEC) 40 MG capsule TAKE 1 CAPSULE BY MOUTH EVERY DAY 07/27/19   Fisher, Donald E, MD  Potassium Gluconate 550 MG TABS Take 550 mg by mouth daily.     [provider]  promethazine (PHENERGAN) 25 MG tablet Take 1 tablet (25 mg total) by mouth every 8 (eight) hours as needed for nausea or vomiting. 09/15/18   Fisher, Donald E, MD  pyridOXINE (VITAMIN B-6) 50 MG tablet Take 50 mg by mouth daily.    [provider]  sertraline (ZOLOFT) 100 MG tablet Take 1 tablet (100 mg total) by mouth daily. 07/31/19   Fisher, Donald E, MD  spironolactone (ALDACTONE) 25 MG tablet Take 1 tablet (25 mg total) by mouth daily. TAKE IN PLACE OF LISINOPRIL 07/31/19   Fisher, Donald E, MD  traZODone (DESYREL) 50 MG tablet Take 50 mg by mouth at bedtime.    [provider]  zinc gluconate 50 MG tablet Take 50 mg by mouth daily.    [provider]    Family History  Problem Relation Age of Onset  . Colon polyps Father   . Lung cancer Father   . Hypertension Father   . Dementia Mother   . Hypertension Brother   . Heart Problems Brother   . Hypertension Brother   . Diabetes Brother        type 2  . Congestive Heart Failure Brother      Social History   Tobacco Use  . Smoking status: Never Smoker  . Smokeless tobacco: Never Used  Substance Use Topics  . Alcohol use: No    Alcohol/week: 0.0 standard drinks    Comment: Former drinker  . Drug use: No    Allergies as of 08/12/2019 - Review Complete 07/27/2019  Allergen Reaction Noted  . Hydrochlorothiazide Other (See Comments) 02/28/2015     Review of Systems:    All systems reviewed and negative except where noted in HPI.   Physical Exam:  There were no vitals taken for this visit. No LMP for male patient. General:   Alert,  Well-developed, well-nourished, pleasant and cooperative in NAD Head:  Normocephalic and atraumatic. Eyes:  Sclera clear, no icterus.   Conjunctiva pink. Ears:  Normal auditory acuity. Neck:  Supple; no masses or thyromegaly. Lungs:  Respirations even and unlabored.  Clear throughout to auscultation.     No wheezes, crackles, or rhonchi. No acute distress. Heart:  Regular rate and rhythm; no murmurs, clicks, rubs, or gallops. Abdomen:  Normal bowel sounds.  No bruits.  Soft, non-tender and positive distention with fluid wave and shifting dullness without masses, hepatosplenomegaly or hernias noted.  No guarding or rebound tenderness.  Negative Carnett sign.   Rectal:  Deferred.  Pulses:  Normal pulses noted. Extremities:  No clubbing or edema.  No cyanosis. Neurologic:  Alert and oriented x3;  grossly normal neurologically. Skin:  Intact without significant lesions or rashes.  No jaundice. Lymph Nodes:  No significant cervical adenopathy. Psych:  Alert and cooperative. Normal mood and affect.  Imaging Studies: US Abdomen Complete  Result Date: 07/31/2019 CLINICAL DATA:  Abdominal swelling EXAM: ABDOMEN ULTRASOUND COMPLETE COMPARISON:  01/29/2019 FINDINGS: Gallbladder: Surgically removed. Common bile duct: Diameter: 5.6 mm. Liver: Liver is somewhat shrunken with a nodular contour consistent with underlying cirrhotic change. This is more marked than that seen on the prior exam. No focal mass is seen. Portal vein is patent on color Doppler imaging with normal direction of blood flow towards the liver. IVC: No abnormality visualized. Pancreas: Visualized portion unremarkable. Spleen: Enlarged at 14 cm. Right Kidney: Length: 11.7 cm. Echogenicity within normal limits. No mass or hydronephrosis visualized.  Left Kidney: Length: 10.7 cm. Echogenicity within normal limits. No mass or hydronephrosis visualized. Abdominal aorta: Not well visualized due to overlying bowel gas. Other findings: Moderate ascites is noted. IMPRESSION: Changes consistent with hepatic cirrhosis and moderate ascites. Associated splenomegaly is noted. Electronically Signed   By: Mark  Lukens M.D.   On: 07/31/2019 09:15   US Paracentesis  Result Date: 08/10/2019 INDICATION: Ascites due to chronic liver disease. Request for therapeutic paracentesis. Limit 2 liters. EXAM: ULTRASOUND GUIDED PARACENTESIS MEDICATIONS: 1% Lidocaine 10 mL COMPLICATIONS: None immediate. PROCEDURE: Informed written consent was obtained from the patient after a discussion of the risks, benefits and alternatives to treatment. A timeout was performed prior to the initiation of the procedure. Initial ultrasound scanning demonstrates a large amount of ascites within the right lower abdominal quadrant. The right lower abdomen was prepped and draped in the usual sterile fashion. 1% lidocaine was used for local anesthesia. Following this, a 6 Fr Safe-T-Centesis catheter was introduced. An ultrasound image was saved for documentation purposes. The paracentesis was performed. The catheter was removed and a dressing was applied. The patient tolerated the procedure well without immediate post procedural complication. FINDINGS: A total of approximately 2 L of clear yellow fluid was removed. IMPRESSION: Successful ultrasound-guided paracentesis yielding 2 liters of peritoneal fluid. Read by: Wendy Blair, PA-C Electronically Signed   By: Adam  Henn M.D.   On: 08/10/2019 10:52    Assessment and Plan:   Elizer D Norberto is a 68 y.o. y/o male who comes in with a history of alcoholic cirrhosis who just quit drinking 5 weeks ago.  The patient will have his diuretics increased with adding Lasix 40 mg a day.  He will also be set up for an EGD to assess for esophageal varices and the need for  primary prophylaxis.  He has already had his HCC screening by his PCP.  He will be set up for kidney function tests to monitor his kidneys during escalation of his diuretics.  The patient will also have his antimitochondrial antibody sent off due to his isolated alkaline phosphatase.  The patient has been explained the plan and agrees with it.    Bhavesh Vazquez, MD. FACG    Note:   This dictation was prepared with Dragon dictation along with smaller phrase technology. Any transcriptional errors that result from this process are unintentional.

## 2019-08-12 NOTE — Progress Notes (Signed)
Gastroenterology Consultation  Referring Provider:     Birdie Sons, MD Primary Care Physician:  Birdie Sons, MD Primary Gastroenterologist:  Dr. Allen Norris     Reason for Consultation:     Cirrhosis        HPI:   [name] is a 69 y.o. y/o male referred for consultation & management of cirrhosis by Dr. Caryn Section, [name], MD.  This patient was sent to me by his primary care provider after being seen by Dr. Fuller Plan in [place] in 2002 and then by Dr. Vira Agar and Dr. Gustavo Lah from 2014 until 2017 at the [facility] clinic.  The patient had colonoscopies in the past with adenomatous polyps found.  The patient's last colonoscopy was 2017.  He also was seen in 2015 at [facility] by gastroenterology.  The patient has a history of splenic vein thrombosis with perigastric varices but an upper endoscopy in 2015 did not show any esophageal or gastric varices.  The patient had a right upper quadrant ultrasound in January of this year that showed:  IMPRESSION: Changes consistent with hepatic cirrhosis and moderate ascites. Associated splenomegaly is noted.  The patient then underwent a paracentesis for his ascites.  The patient has had a isolated increased alkaline phosphatase but his other liver enzymes are normal.  The patient's alpha-fetoprotein was also normal. The patient reports that he had been drinking for many years and quit for about a year and a half.  The patient went back to heavy drinking and only stopped 5 weeks ago.  The patient also reports that after having his paracentesis 2 days ago his fluid is returning.  The patient was started on Aldactone 25 mg and has not noticed much of a difference.  The patient has no abdominal pain nausea vomiting fevers or chills.     Past Medical History:  Diagnosis Date  . Alcoholism ([place])   . 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 Elevated liver enzymes   . Esophageal varices ([place])   . Gastritis   . Pancreatitis     Past Surgical History:  Procedure  Laterality Date  . CHOLECYSTECTOMY  2006  . COLONOSCOPY WITH ESOPHAGOGASTRODUODENOSCOPY (EGD)    . COLONOSCOPY WITH PROPOFOL N/A 11/21/2015   Procedure: COLONOSCOPY WITH PROPOFOL;  Surgeon: Manya Silvas, MD;  Location: [facility] ENDOSCOPY;  Service: Endoscopy;  Laterality: N/A;  . HERNIA REPAIR     Inguinal Hernia Repai. Left: 07/28/2013 Dr. [name]; 2nd repair done 04/30/2014  . TONSILLECTOMY      Prior to Admission medications   Medication Sig Start Date End Date Taking? Authorizing Provider  ACCU-CHEK FASTCLIX LANCETS MISC USE TO CHECK BLOOD SUGAR 3 TIMES A DAY 07/13/18   Birdie Sons, MD  allopurinol (ZYLOPRIM) 100 MG tablet Take 1 tablet (100 mg total) by mouth daily. 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 Reported on 11/21/2015    [provider]  cyclobenzaprine (FLEXERIL) 5 MG tablet Take 1 tablet (5 mg total) by mouth 3 (three) times daily as needed for muscle spasms. Patient not taking: Reported on 07/27/2019 09/15/18   Birdie Sons, MD  gabapentin (NEURONTIN) 100 MG capsule One at night for 3 nights, then one twice a day for 3 days,  then one three times daily 10/03/17   Birdie Sons, MD  glucose blood (ACCU-CHEK AVIVA PLUS) test strip Use as instructed 10/06/17   Birdie Sons, MD  insulin aspart (NOVOLOG FLEXPEN) 100 UNIT/ML FlexPen INJECT UP TO 10 UNITS 3    TIMES A DAY BEFORE MEALS 08/07/19   Birdie Sons, MD  insulin degludec (TRESIBA FLEXTOUCH) 100 UNIT/ML SOPN FlexTouch Pen Inject up to 20 units every day at bedtime 08/07/19   Birdie Sons, MD  Insulin Pen Needle (PEN NEEDLES) 31G X 5 MM MISC 1 Device by Does not apply route 3 (three) times daily. 03/04/18   Birdie Sons, MD  lisinopril (ZESTRIL) 10 MG tablet TAKE 1 TABLET  BY MOUTH EVERY DAY 03/18/19   Birdie Sons, MD  magnesium gluconate (MAGONATE) 500 MG tablet Take 500 mg by mouth daily.     [provider]  Multiple Vitamin (MULTIVITAMIN WITH MINERALS) TABS tablet Take 1 tablet by mouth daily.    [provider]  naltrexone (DEPADE) 50 MG tablet Take 1 tablet (50 mg total) by mouth daily. 02/10/18   Birdie Sons, MD  omeprazole (PRILOSEC) 40 MG capsule TAKE 1 CAPSULE BY MOUTH EVERY DAY 07/27/19   Birdie Sons, MD  Potassium Gluconate 550 MG TABS Take 550 mg by mouth daily.     [provider]  promethazine (PHENERGAN) 25 MG tablet Take 1 tablet (25 mg total) by mouth every 8 (eight) hours as needed for nausea or vomiting. 09/15/18   Birdie Sons, MD  pyridOXINE (VITAMIN B-6) 50 MG tablet Take 50 mg by mouth daily.    [provider]  sertraline (ZOLOFT) 100 MG tablet Take 1 tablet (100 mg total) by mouth daily. 07/31/19   Birdie Sons, MD  spironolactone (ALDACTONE) 25 MG tablet Take 1 tablet (25 mg total) by mouth daily. TAKE IN PLACE OF LISINOPRIL 07/31/19   Birdie Sons, MD  traZODone (DESYREL) 50 MG tablet Take 50 mg by mouth at bedtime.    [provider]  zinc gluconate 50 MG tablet Take 50 mg by mouth daily.    [provider]    Family History  Problem Relation Age of Onset  . Colon polyps Father   . Lung cancer Father   . Hypertension Father   . Dementia Mother   . Hypertension Brother   . Heart Problems Brother   . Hypertension Brother   . Diabetes Brother        type 2  . Congestive Heart Failure Brother      Social History   Tobacco Use  . Smoking status: Never Smoker  . Smokeless tobacco: Never Used  Substance Use Topics  . Alcohol use: No    Alcohol/week: 0.0 standard drinks    Comment: Former drinker  . Drug use: No    Allergies as of 08/12/2019 - Review Complete 07/27/2019  Allergen Reaction Noted  . Hydrochlorothiazide Other (See Comments) 02/28/2015     Review of Systems:    All systems reviewed and negative except where noted in HPI.   Physical Exam:  There were no vitals taken for this visit. No LMP for male patient. General:   Alert,  Well-developed, well-nourished, pleasant and cooperative in NAD Head:  Normocephalic and atraumatic. Eyes:  Sclera clear, no icterus.   Conjunctiva pink. Ears:  Normal auditory acuity. Neck:  Supple; no masses or thyromegaly. Lungs:  Respirations even and unlabored.  Clear throughout to auscultation.  No wheezes, crackles, or rhonchi. No acute distress. Heart:  Regular rate and rhythm; no murmurs, clicks, rubs, or gallops. Abdomen:  Normal bowel sounds.  No bruits.  Soft, non-tender and positive distention with fluid wave and shifting dullness without masses, hepatosplenomegaly or hernias noted.  No guarding or rebound tenderness.  Negative Carnett sign.   Rectal:  Deferred.  Pulses:  Normal pulses noted. Extremities:  No clubbing or edema.  No cyanosis. Neurologic:  Alert and oriented x3;  grossly normal neurologically. Skin:  Intact without significant lesions or rashes.  No jaundice. Lymph Nodes:  No significant cervical adenopathy. Psych:  Alert and cooperative. Normal mood and affect.  Imaging Studies: US Abdomen Complete  Result Date: 07/31/2019 CLINICAL DATA:  Abdominal swelling EXAM: ABDOMEN ULTRASOUND COMPLETE COMPARISON:  01/29/2019 FINDINGS: Gallbladder: Surgically removed. Common bile duct: Diameter: 5.6 mm. Liver: Liver is somewhat shrunken with a nodular contour consistent with underlying cirrhotic change. This is more marked than that seen on the prior exam. No focal mass is seen. Portal vein is patent on color Doppler imaging with normal direction of blood flow towards the liver. IVC: No abnormality visualized. Pancreas: Visualized portion unremarkable. Spleen: Enlarged at 14 cm. Right Kidney: Length: 11.7 cm. Echogenicity within normal limits. No mass or hydronephrosis visualized.  Left Kidney: Length: 10.7 cm. Echogenicity within normal limits. No mass or hydronephrosis visualized. Abdominal aorta: Not well visualized due to overlying bowel gas. Other findings: Moderate ascites is noted. IMPRESSION: Changes consistent with hepatic cirrhosis and moderate ascites. Associated splenomegaly is noted. Electronically Signed   By: Inez Catalina M.D.   On: 07/31/2019 09:15   US Paracentesis  Result Date: 08/10/2019 INDICATION: Ascites due to chronic liver disease. Request for therapeutic paracentesis. Limit 2 liters. EXAM: ULTRASOUND GUIDED PARACENTESIS MEDICATIONS: 1% Lidocaine 10 mL COMPLICATIONS: None immediate. PROCEDURE: Informed written consent was obtained from the patient after a discussion of the risks, benefits and alternatives to treatment. A timeout was performed prior to the initiation of the procedure. Initial ultrasound scanning demonstrates a large amount of ascites within the right lower abdominal quadrant. The right lower abdomen was prepped and draped in the usual sterile fashion. 1% lidocaine was used for local anesthesia. Following this, a 6 Fr Safe-T-Centesis catheter was introduced. An ultrasound image was saved for documentation purposes. The paracentesis was performed. The catheter was removed and a dressing was applied. The patient tolerated the procedure well without immediate post procedural complication. FINDINGS: A total of approximately 2 L of clear yellow fluid was removed. IMPRESSION: Successful ultrasound-guided paracentesis yielding 2 liters of peritoneal fluid. Read by: Gareth Eagle, PA-C Electronically Signed   By: Markus Daft M.D.   On: 08/10/2019 10:52    Assessment and Plan:   JAZZ ROGALA is a 69 y.o. y/o male who comes in with a history of alcoholic cirrhosis who just quit drinking 5 weeks ago.  The patient will have his diuretics increased with adding Lasix 40 mg a day.  He will also be set up for an EGD to assess for esophageal varices and the need for  primary prophylaxis.  He has already had his [place] screening by his PCP.  He will be set up for kidney function tests to monitor his kidneys during escalation of his diuretics.  The patient will also have his antimitochondrial antibody sent off due to his isolated alkaline phosphatase.  The patient has been explained the plan and agrees with it.    Lucilla Lame, MD. Marval Regal    Note:  This dictation was prepared with Dragon dictation along with smaller phrase technology. Any transcriptional errors that result from this process are unintentional.

## 2019-08-13 LAB — MITOCHONDRIAL ANTIBODIES: Mitochondrial M2 Ab, IgG: <20 Units (ref 0.0–20.0)

## 2019-08-17 ENCOUNTER — Telehealth: Payer: Self-pay

## 2019-08-17 ENCOUNTER — Other Ambulatory Visit: Payer: Self-pay

## 2019-08-17 DIAGNOSIS — K7460 Unspecified cirrhosis of liver: Secondary | ICD-10-CM

## 2019-08-17 NOTE — Telephone Encounter (Signed)
Lab results sent to pt via mychart.

## 2019-08-17 NOTE — Telephone Encounter (Signed)
-----   Message from Lucilla Lame, MD sent at 08/13/2019  9:11 PM EST ----- Let the patient know that his kidney function is good and we will check it in a week after he is taking the Lasix to see that it is not increasing.

## 2019-08-18 ENCOUNTER — Other Ambulatory Visit: Payer: Self-pay

## 2019-08-18 ENCOUNTER — Other Ambulatory Visit
Admission: RE | Admit: 2019-08-18 | Discharge: 2019-08-18 | Disposition: A | Discharge status: Home / Self Care | Payer: Medicare HMO | Source: Ambulatory Visit | Attending: Gastroenterology | Admitting: Gastroenterology

## 2019-08-18 DIAGNOSIS — Z01812 Encounter for preprocedural laboratory examination: Secondary | ICD-10-CM | POA: Diagnosis not present

## 2019-08-18 DIAGNOSIS — K7460 Unspecified cirrhosis of liver: Secondary | ICD-10-CM

## 2019-08-18 DIAGNOSIS — Z20822 Contact with and (suspected) exposure to covid-19: Secondary | ICD-10-CM | POA: Insufficient documentation

## 2019-08-18 LAB — BASIC METABOLIC PANEL
Anion gap: 9 (ref 5–15)
BUN: 10 mg/dL (ref 8–23)
CO2: 30 mmol/L (ref 22–32)
Calcium: 8.6 mg/dL — ABNORMAL LOW (ref 8.9–10.3)
Chloride: 99 mmol/L (ref 98–111)
Creatinine, Ser: 0.56 mg/dL — ABNORMAL LOW (ref 0.61–1.24)
GFR calc Af Amer: >60 mL/min (ref >60)
GFR calc non Af Amer: >60 mL/min (ref >60)
Glucose, Bld: 157 mg/dL — ABNORMAL HIGH (ref 70–99)
Potassium: 3.9 mmol/L (ref 3.5–5.1)
Sodium: 138 mmol/L (ref 135–145)

## 2019-08-18 LAB — SARS CORONAVIRUS 2 (TAT 6-24 HRS): SARS Coronavirus 2: NEGATIVE

## 2019-08-19 ENCOUNTER — Telehealth: Payer: Self-pay

## 2019-08-19 ENCOUNTER — Other Ambulatory Visit: Payer: Self-pay

## 2019-08-19 ENCOUNTER — Encounter: Payer: Self-pay | Admitting: Gastroenterology

## 2019-08-19 DIAGNOSIS — K7031 Alcoholic cirrhosis of liver with ascites: Secondary | ICD-10-CM

## 2019-08-19 DIAGNOSIS — K7460 Unspecified cirrhosis of liver: Secondary | ICD-10-CM

## 2019-08-19 MED ORDER — FUROSEMIDE 40 MG PO TABS: 40.0000 mg | ORAL_TABLET | Freq: Two times a day (BID) | ORAL | 3 refills | Status: DC

## 2019-08-19 NOTE — Telephone Encounter (Signed)
-----   Message from Lucilla Lame, MD sent at 08/18/2019  7:13 PM EST ----- Let the patient know that his kidney function is still very good and if he is continuing to have fluid in his belly he should go up on the Aldactone to 50 mg a day and increase his Lasix to 40 mg twice a day.

## 2019-08-19 NOTE — Telephone Encounter (Signed)
Pt notified of lab results. Pt still having issues with the ascites. Pt advised to increase Aldactone to 50mg  daily and Lasix 40mg  twice daily.

## 2019-08-21 ENCOUNTER — Ambulatory Visit: Payer: Medicare HMO | Admitting: Certified Registered"

## 2019-08-21 ENCOUNTER — Encounter: Payer: Self-pay | Admitting: Gastroenterology

## 2019-08-21 ENCOUNTER — Ambulatory Visit
Admission: RE | Admit: 2019-08-21 | Discharge: 2019-08-21 | Disposition: A | Discharge status: Home / Self Care | Payer: Medicare HMO | Attending: Gastroenterology | Admitting: Gastroenterology

## 2019-08-21 ENCOUNTER — Encounter
Admission: RE | Disposition: A | Discharge status: Home / Self Care | Payer: Self-pay | Source: Home / Self Care | Attending: Gastroenterology

## 2019-08-21 DIAGNOSIS — I864 Gastric varices: Secondary | ICD-10-CM | POA: Diagnosis not present

## 2019-08-21 DIAGNOSIS — I8501 Esophageal varices with bleeding: Secondary | ICD-10-CM | POA: Diagnosis not present

## 2019-08-21 DIAGNOSIS — Z833 Family history of diabetes mellitus: Secondary | ICD-10-CM | POA: Insufficient documentation

## 2019-08-21 DIAGNOSIS — K7460 Unspecified cirrhosis of liver: Secondary | ICD-10-CM

## 2019-08-21 DIAGNOSIS — Z820 Family history of epilepsy and other diseases of the nervous system: Secondary | ICD-10-CM | POA: Insufficient documentation

## 2019-08-21 DIAGNOSIS — Z8371 Family history of colonic polyps: Secondary | ICD-10-CM | POA: Insufficient documentation

## 2019-08-21 DIAGNOSIS — Z86718 Personal history of other venous thrombosis and embolism: Secondary | ICD-10-CM | POA: Diagnosis not present

## 2019-08-21 DIAGNOSIS — I10 Essential (primary) hypertension: Secondary | ICD-10-CM | POA: Diagnosis not present

## 2019-08-21 DIAGNOSIS — Z888 Allergy status to other drugs, medicaments and biological substances status: Secondary | ICD-10-CM | POA: Insufficient documentation

## 2019-08-21 DIAGNOSIS — E119 Type 2 diabetes mellitus without complications: Secondary | ICD-10-CM | POA: Diagnosis not present

## 2019-08-21 DIAGNOSIS — Z9049 Acquired absence of other specified parts of digestive tract: Secondary | ICD-10-CM | POA: Insufficient documentation

## 2019-08-21 DIAGNOSIS — K3189 Other diseases of stomach and duodenum: Secondary | ICD-10-CM | POA: Insufficient documentation

## 2019-08-21 DIAGNOSIS — R161 Splenomegaly, not elsewhere classified: Secondary | ICD-10-CM | POA: Insufficient documentation

## 2019-08-21 DIAGNOSIS — K860 Alcohol-induced chronic pancreatitis: Secondary | ICD-10-CM | POA: Diagnosis not present

## 2019-08-21 DIAGNOSIS — K766 Portal hypertension: Secondary | ICD-10-CM | POA: Insufficient documentation

## 2019-08-21 DIAGNOSIS — I8510 Secondary esophageal varices without bleeding: Secondary | ICD-10-CM | POA: Insufficient documentation

## 2019-08-21 DIAGNOSIS — R748 Abnormal levels of other serum enzymes: Secondary | ICD-10-CM | POA: Insufficient documentation

## 2019-08-21 DIAGNOSIS — J449 Chronic obstructive pulmonary disease, unspecified: Secondary | ICD-10-CM | POA: Diagnosis not present

## 2019-08-21 DIAGNOSIS — Z794 Long term (current) use of insulin: Secondary | ICD-10-CM | POA: Diagnosis not present

## 2019-08-21 DIAGNOSIS — K7031 Alcoholic cirrhosis of liver with ascites: Secondary | ICD-10-CM | POA: Diagnosis not present

## 2019-08-21 DIAGNOSIS — Z8249 Family history of ischemic heart disease and other diseases of the circulatory system: Secondary | ICD-10-CM | POA: Diagnosis not present

## 2019-08-21 DIAGNOSIS — F329 Major depressive disorder, single episode, unspecified: Secondary | ICD-10-CM | POA: Insufficient documentation

## 2019-08-21 DIAGNOSIS — R69 Illness, unspecified: Secondary | ICD-10-CM | POA: Diagnosis not present

## 2019-08-21 DIAGNOSIS — Z801 Family history of malignant neoplasm of trachea, bronchus and lung: Secondary | ICD-10-CM | POA: Diagnosis not present

## 2019-08-21 DIAGNOSIS — Z79899 Other long term (current) drug therapy: Secondary | ICD-10-CM | POA: Diagnosis not present

## 2019-08-21 DIAGNOSIS — Z13810 Encounter for screening for upper gastrointestinal disorder: Secondary | ICD-10-CM

## 2019-08-21 HISTORY — DX: Gastro-esophageal reflux disease without esophagitis: K21.9

## 2019-08-21 HISTORY — DX: Dyspnea, unspecified: R06.00

## 2019-08-21 HISTORY — PX: ESOPHAGOGASTRODUODENOSCOPY (EGD) WITH PROPOFOL: SHX5813

## 2019-08-21 LAB — GLUCOSE, CAPILLARY: Glucose-Capillary: 73 mg/dL (ref 70–99)

## 2019-08-21 SURGERY — ESOPHAGOGASTRODUODENOSCOPY (EGD) WITH PROPOFOL
Anesthesia: General

## 2019-08-21 MED ORDER — CARVEDILOL 3.125 MG PO TABS: 3.1250 mg | ORAL_TABLET | Freq: Two times a day (BID) | ORAL | 11 refills | Status: DC

## 2019-08-21 MED ORDER — SODIUM CHLORIDE 0.9 % IV SOLN: INTRAVENOUS | Status: DC

## 2019-08-21 MED ORDER — PROPOFOL 500 MG/50ML IV EMUL
INTRAVENOUS | Status: DC | PRN
  Administered 2019-08-21: 70 ug/kg/min via INTRAVENOUS

## 2019-08-21 MED ORDER — LIDOCAINE HCL (CARDIAC) PF 100 MG/5ML IV SOSY
PREFILLED_SYRINGE | INTRAVENOUS | Status: DC | PRN
  Administered 2019-08-21: 100 mg via INTRAVENOUS

## 2019-08-21 MED ORDER — LIDOCAINE HCL (PF) 2 % IJ SOLN
INTRAMUSCULAR | Status: AC
  Filled 2019-08-21: qty 5

## 2019-08-21 MED ORDER — PROPOFOL 500 MG/50ML IV EMUL
INTRAVENOUS | Status: AC
  Filled 2019-08-21: qty 50

## 2019-08-21 NOTE — Anesthesia Postprocedure Evaluation (Signed)
Anesthesia Post Note  Patient: [name]  Procedure(s) Performed: ESOPHAGOGASTRODUODENOSCOPY (EGD) WITH PROPOFOL (N/A )  Patient location during evaluation: Endoscopy Anesthesia Type: General Level of consciousness: awake and alert Pain management: pain level controlled Vital Signs Assessment: post-procedure vital signs reviewed and stable Respiratory status: spontaneous breathing, nonlabored ventilation, respiratory function stable and patient connected to nasal cannula oxygen Cardiovascular status: blood pressure returned to baseline and stable Postop Assessment: no apparent nausea or vomiting Anesthetic complications: no     Last Vitals:  Vitals:   08/21/19 1240 08/21/19 1300  BP: 124/88 120/83  Pulse: 84 85  Resp: 17 19  Temp: 36.6 C   SpO2: 97% 98%    Last Pain:  Vitals:   08/21/19 1240  TempSrc: Temporal  PainSc:                  Arita Miss

## 2019-08-21 NOTE — Transfer of Care (Signed)
Immediate Anesthesia Transfer of Care Note  Patient: [name]  Procedure(s) Performed: ESOPHAGOGASTRODUODENOSCOPY (EGD) WITH PROPOFOL (N/A )  Patient Location: PACU  Anesthesia Type:General  Level of Consciousness: sedated  Airway & Oxygen Therapy: Patient Spontanous Breathing and Patient connected to face mask oxygen  Post-op Assessment: Report given to RN and Post -op Vital signs reviewed and stable  Post vital signs: Reviewed and stable  Last Vitals:  Vitals Value Taken Time  BP 124/88 08/21/19 1247  Temp    Pulse 87 08/21/19 1247  Resp 16 08/21/19 1247  SpO2 98 % 08/21/19 1247  Vitals shown include unvalidated device data.  Last Pain:  Vitals:   08/21/19 1150  TempSrc: Temporal  PainSc: 0-No pain         Complications: No apparent anesthesia complications

## 2019-08-21 NOTE — Anesthesia Preprocedure Evaluation (Signed)
Anesthesia Evaluation  Patient identified by MRN, date of birth, ID band Patient awake    Reviewed: Allergy & Precautions, NPO status , Patient's Chart, lab work & pertinent test results  History of Anesthesia Complications Negative for: history of anesthetic complications  Airway Mallampati: II  TM Distance: >3 FB Neck ROM: Full    Dental  (+) Edentulous Upper, Partial Lower, Missing,    Pulmonary shortness of breath, neg sleep apnea, neg COPD, Patient abstained from smoking.Not current smoker,    Pulmonary exam normal breath sounds clear to auscultation       Cardiovascular Exercise Tolerance: Good METShypertension, (-) CAD and (-) Past MI (-) dysrhythmias  Rhythm:Regular Rate:Normal - Systolic murmurs    Neuro/Psych PSYCHIATRIC DISORDERS Depression negative neurological ROS     GI/Hepatic GERD  Controlled and Medicated,(+) Cirrhosis   ascites  (-) substance abuse  , 2L tapped few weeks ago, lost over 10 pounds on diuretics. 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[place]) No date: Diabetes mellitus without complication (H[facility]) No date: Dyspnea No date: Elevated liver enzymes No date: Esophageal varices ([facility]) No date: Gastritis No date: GERD (gastroesophageal reflux disease) No date: Hypertension No date: Pancreatitis  Reproductive/Obstetrics                             Anesthesia Physical Anesthesia Plan  ASA: III  Anesthesia Plan: General   Post-op Pain Management:    Induction: Intravenous  PONV Risk Score and Plan: 2 and Ondansetron, Propofol infusion and TIVA  Airway Management Planned: Nasal  Cannula  Additional Equipment: None  Intra-op Plan:   Post-operative Plan:   Informed Consent: I have reviewed the patients History and Physical, chart, labs and discussed the procedure including the risks, benefits and alternatives for the proposed anesthesia with the patient or authorized representative who has indicated his/her understanding and acceptance.     Dental advisory given  Plan Discussed with: CRNA and Surgeon  Anesthesia Plan Comments: (Discussed risks of anesthesia with patient, including possibility of difficulty with spontaneous ventilation under anesthesia necessitating airway intervention, PONV, and rare risks such as cardiac or respiratory events. Patient understands.  Talked about risks of aspiration due to ascites. Patient denies any increased GERD symptoms from ascites. Will proceed with anesthesia. Patient understands.)        Anesthesia Quick Evaluation

## 2019-08-21 NOTE — Interval H&P Note (Signed)
History and Physical Interval Note:  08/21/2019 1:57 PM  [name]  has presented today for surgery, with the diagnosis of Screening for esophageal varices I85.01.  The various methods of treatment have been discussed with the patient and family. After consideration of risks, benefits and other options for treatment, the patient has consented to  Procedure(s): ESOPHAGOGASTRODUODENOSCOPY (EGD) WITH PROPOFOL (N/A) as a surgical intervention.  The patient's history has been reviewed, patient examined, no change in status, stable for surgery.  I have reviewed the patient's chart and labs.  Questions were answered to the patient's satisfaction.     Oshay Stranahan [employer]

## 2019-08-21 NOTE — Op Note (Signed)
[facility]astroenter[facility] Patient Name: [name] Procedure Date: 08/21/2019 11:49 AM MRN: [id number] Account #: [id number] Date of Birth: [birth year] Admit Type: Outpatient Age: 69 Room: [facility] ENDO ROOM 4 Gender: Male Note Status: Finalized Procedure:             Upper GI endoscopy Indications:           Cirrhosis rule out esophageal varices Providers:             Lucilla Lame MD, MD Medicines:             Propofol per Anesthesia Complications:         No immediate complications. Procedure:             Pre-Anesthesia Assessment:                        - Prior to the procedure, a History and Physical was                         performed, and patient medications and allergies were                         reviewed. The patient's tolerance of previous                         anesthesia was also reviewed. The risks and benefits                         of the procedure and the sedation options and risks                         were discussed with the patient. All questions were                         answered, and informed consent was obtained. Prior                         Anticoagulants: The patient has taken no previous                         anticoagulant or antiplatelet agents. ASA Grade                         Assessment: III - A patient with severe systemic                         disease. After reviewing the risks and benefits, the                         patient was deemed in satisfactory condition to                         undergo the procedure.                        After obtaining informed consent, the endoscope was                         passed under direct vision. Throughout the procedure,  the patient's blood pressure, pulse, and oxygen                         saturations were monitored continuously. The Endoscope                         was introduced through the mouth, and advanced to the                         second  part of duodenum. The upper GI endoscopy was                         accomplished without difficulty. The patient tolerated                         the procedure well. Findings:      Grade I varices with no stigmata of recent bleeding were found in the       lower third of the esophagus,. No red wale signs were present.      Moderate portal hypertensive gastropathy was found in the entire       examined stomach.      Type 1 isolated gastric varices (IGV1, varices located in the fundus)       with no bleeding were found in the gastric fundus. There were no       stigmata of recent bleeding.      The examined duodenum was normal. Impression:            - Grade I esophageal varices with no stigmata of                         recent bleeding.                        - Portal hypertensive gastropathy.                        - Type 1 isolated gastric varices (IGV1, varices                         located in the fundus), without bleeding.                        - Normal examined duodenum.                        - No specimens collected. Recommendation:        - Discharge patient to home.                        - Resume previous diet.                        - Continue present medications.                        - Will start Coreg Procedure Code(s):     --- Professional ---                        [phone], Esophagogastroduodenoscopy, flexible,  transoral; diagnostic, including collection of                         specimen(s) by brushing or washing, when performed                         (separate procedure) Diagnosis Code(s):     --- Professional ---                        K74.60, Unspecified cirrhosis of liver                        I86.4, Gastric varices CPT copyright 2019 American Medical Association. All rights reserved. The codes documented in this report are preliminary and upon coder review may  be revised to meet current compliance requirements. Lucilla Lame MD,  MD 08/21/2019 12:57:34 PM This report has been signed electronically. Number of Addenda: 0 Note Initiated On: 08/21/2019 11:49 AM Estimated Blood Loss:  Estimated blood loss: none.      [facility]

## 2019-08-23 ENCOUNTER — Other Ambulatory Visit: Payer: Self-pay | Admitting: Family Medicine

## 2019-08-23 DIAGNOSIS — K7031 Alcoholic cirrhosis of liver with ascites: Secondary | ICD-10-CM

## 2019-08-24 ENCOUNTER — Encounter: Payer: Self-pay | Admitting: *Deleted

## 2019-08-26 ENCOUNTER — Other Ambulatory Visit
Admission: RE | Admit: 2019-08-26 | Discharge: 2019-08-26 | Disposition: A | Discharge status: Home / Self Care | Payer: Medicare HMO | Source: Ambulatory Visit | Attending: Gastroenterology | Admitting: Gastroenterology

## 2019-08-26 DIAGNOSIS — K7460 Unspecified cirrhosis of liver: Secondary | ICD-10-CM | POA: Insufficient documentation

## 2019-08-26 LAB — BASIC METABOLIC PANEL
Anion gap: 5 (ref 5–15)
BUN: 7 mg/dL — ABNORMAL LOW (ref 8–23)
CO2: 35 mmol/L — ABNORMAL HIGH (ref 22–32)
Calcium: 8.6 mg/dL — ABNORMAL LOW (ref 8.9–10.3)
Chloride: 99 mmol/L (ref 98–111)
Creatinine, Ser: 0.69 mg/dL (ref 0.61–1.24)
GFR calc Af Amer: >60 mL/min (ref >60)
GFR calc non Af Amer: >60 mL/min (ref >60)
Glucose, Bld: 186 mg/dL — ABNORMAL HIGH (ref 70–99)
Potassium: 4.1 mmol/L (ref 3.5–5.1)
Sodium: 139 mmol/L (ref 135–145)

## 2019-08-27 ENCOUNTER — Other Ambulatory Visit: Payer: Self-pay

## 2019-08-27 ENCOUNTER — Telehealth: Payer: Self-pay

## 2019-08-27 DIAGNOSIS — K7460 Unspecified cirrhosis of liver: Secondary | ICD-10-CM

## 2019-08-27 MED ORDER — SPIRONOLACTONE 100 MG PO TABS: 100.0000 mg | ORAL_TABLET | Freq: Every day | ORAL | 3 refills | Status: DC

## 2019-08-27 NOTE — Telephone Encounter (Signed)
-----   Message from Lucilla Lame, MD sent at 08/26/2019 10:00 PM EST ----- Let the patient know that his creatinine is still within the normal level and if he is continuing to have the ascites we can go up on his diuretics.  I would start with doubling his Aldactone.

## 2019-08-27 NOTE — Telephone Encounter (Signed)
Pt notified of lab results.  Increased Spironolactone to 100mg  daily  Lasix remained 40mg  BID.  Confirmed dosage with pt. Pt will go next week to repeat labs.

## 2019-09-02 ENCOUNTER — Other Ambulatory Visit
Admission: RE | Admit: 2019-09-02 | Discharge: 2019-09-02 | Disposition: A | Discharge status: Home / Self Care | Payer: Medicare HMO | Source: Ambulatory Visit | Attending: Gastroenterology | Admitting: Gastroenterology

## 2019-09-02 DIAGNOSIS — H524 Presbyopia: Secondary | ICD-10-CM | POA: Diagnosis not present

## 2019-09-02 DIAGNOSIS — E119 Type 2 diabetes mellitus without complications: Secondary | ICD-10-CM | POA: Diagnosis not present

## 2019-09-02 DIAGNOSIS — K7460 Unspecified cirrhosis of liver: Secondary | ICD-10-CM | POA: Diagnosis not present

## 2019-09-02 DIAGNOSIS — H2513 Age-related nuclear cataract, bilateral: Secondary | ICD-10-CM | POA: Diagnosis not present

## 2019-09-02 DIAGNOSIS — E089 Diabetes mellitus due to underlying condition without complications: Secondary | ICD-10-CM | POA: Diagnosis not present

## 2019-09-02 LAB — BASIC METABOLIC PANEL
Anion gap: 8 (ref 5–15)
BUN: 8 mg/dL (ref 8–23)
CO2: 27 mmol/L (ref 22–32)
Calcium: 8.5 mg/dL — ABNORMAL LOW (ref 8.9–10.3)
Chloride: 97 mmol/L — ABNORMAL LOW (ref 98–111)
Creatinine, Ser: 0.63 mg/dL (ref 0.61–1.24)
GFR calc Af Amer: >60 mL/min (ref >60)
GFR calc non Af Amer: >60 mL/min (ref >60)
Glucose, Bld: 305 mg/dL — ABNORMAL HIGH (ref 70–99)
Potassium: 4.3 mmol/L (ref 3.5–5.1)
Sodium: 132 mmol/L — ABNORMAL LOW (ref 135–145)

## 2019-09-02 LAB — HM DIABETES EYE EXAM

## 2019-09-04 ENCOUNTER — Telehealth: Payer: Self-pay

## 2019-09-04 ENCOUNTER — Other Ambulatory Visit: Payer: Self-pay

## 2019-09-04 ENCOUNTER — Telehealth: Payer: Self-pay | Admitting: Gastroenterology

## 2019-09-04 DIAGNOSIS — K7460 Unspecified cirrhosis of liver: Secondary | ICD-10-CM

## 2019-09-04 DIAGNOSIS — K7031 Alcoholic cirrhosis of liver with ascites: Secondary | ICD-10-CM

## 2019-09-04 MED ORDER — ALBUMIN HUMAN 25 % IV SOLN: 12.5000 g | Freq: Once | INTRAVENOUS | Status: DC

## 2019-09-04 NOTE — Telephone Encounter (Signed)
LVM for pt to return my call.

## 2019-09-04 NOTE — Telephone Encounter (Signed)
Patient called l/m on v/m  & would Ginger to call  Him back

## 2019-09-04 NOTE — Telephone Encounter (Signed)
-----   Message from Lucilla Lame, MD sent at 09/03/2019 11:58 AM EST ----- Find out how his ascites is.  If he is doing well stay on his medication if is not we can go up on his Aldactone.

## 2019-09-04 NOTE — Telephone Encounter (Signed)
Pt notified of results

## 2019-09-04 NOTE — Telephone Encounter (Signed)
-----   Message from Lucilla Lame, MD sent at 09/04/2019  9:59 AM EST ----- Regarding: RE: Sure. But double the Aldactone to twice a day ----- Message ----- From: [place], CMA Sent: 09/04/2019   9:56 AM EST To: Lucilla Lame, MD  Spoke with pt regarding his lab results. He feels his stomach is still quite full. He would like to know if you would ok him to get a paracentesis. Please advise.

## 2019-09-04 NOTE — Telephone Encounter (Signed)
Pt notified to increase aldactone. Order placed for paracentesis. Pt aware.

## 2019-09-08 ENCOUNTER — Other Ambulatory Visit: Payer: Self-pay

## 2019-09-08 ENCOUNTER — Ambulatory Visit
Admission: RE | Admit: 2019-09-08 | Discharge: 2019-09-08 | Disposition: A | Discharge status: Home / Self Care | Payer: Medicare HMO | Source: Ambulatory Visit | Attending: Gastroenterology | Admitting: Gastroenterology

## 2019-09-08 DIAGNOSIS — K7031 Alcoholic cirrhosis of liver with ascites: Secondary | ICD-10-CM | POA: Diagnosis present

## 2019-09-08 DIAGNOSIS — R188 Other ascites: Secondary | ICD-10-CM | POA: Diagnosis not present

## 2019-09-08 DIAGNOSIS — R69 Illness, unspecified: Secondary | ICD-10-CM | POA: Diagnosis not present

## 2019-09-08 DIAGNOSIS — K7460 Unspecified cirrhosis of liver: Secondary | ICD-10-CM

## 2019-09-09 ENCOUNTER — Other Ambulatory Visit
Admission: RE | Admit: 2019-09-09 | Discharge: 2019-09-09 | Disposition: A | Discharge status: Home / Self Care | Payer: Medicare HMO | Source: Ambulatory Visit | Attending: Gastroenterology | Admitting: Gastroenterology

## 2019-09-09 DIAGNOSIS — K7460 Unspecified cirrhosis of liver: Secondary | ICD-10-CM | POA: Insufficient documentation

## 2019-09-09 LAB — BASIC METABOLIC PANEL
Anion gap: 12 (ref 5–15)
BUN: 14 mg/dL (ref 8–23)
CO2: 29 mmol/L (ref 22–32)
Calcium: 9 mg/dL (ref 8.9–10.3)
Chloride: 91 mmol/L — ABNORMAL LOW (ref 98–111)
Creatinine, Ser: 0.67 mg/dL (ref 0.61–1.24)
GFR calc Af Amer: >60 mL/min (ref >60)
GFR calc non Af Amer: >60 mL/min (ref >60)
Glucose, Bld: 236 mg/dL — ABNORMAL HIGH (ref 70–99)
Potassium: 4.9 mmol/L (ref 3.5–5.1)
Sodium: 132 mmol/L — ABNORMAL LOW (ref 135–145)

## 2019-09-10 ENCOUNTER — Telehealth: Payer: Self-pay

## 2019-09-10 NOTE — Telephone Encounter (Signed)
Pt and wife notified of results. Spoke with pt's wife and she stated pt was having a lot of abdominal pain that he hasn't expressed to me and he is losing quite a bit of weight. Pt has been scheduled for a follow up to discuss these issues.

## 2019-09-10 NOTE — Telephone Encounter (Signed)
-----   Message from Lucilla Lame, MD sent at 09/09/2019  8:44 PM EST ----- His kidneys are still doing well. If he still has ascites then we can go up on his meds. Can you find out how of lasix and aldactone he is taking. Thanks.

## 2019-09-16 ENCOUNTER — Other Ambulatory Visit: Payer: Self-pay

## 2019-09-16 ENCOUNTER — Ambulatory Visit (INDEPENDENT_AMBULATORY_CARE_PROVIDER_SITE_OTHER): Payer: Medicare HMO | Admitting: Gastroenterology

## 2019-09-16 ENCOUNTER — Encounter: Payer: Self-pay | Admitting: Gastroenterology

## 2019-09-16 VITALS — BP 126/71 | HR 82 | Temp 98.2°F | Ht 65.0 in | Wt 132.8 lb

## 2019-09-16 DIAGNOSIS — R69 Illness, unspecified: Secondary | ICD-10-CM | POA: Diagnosis not present

## 2019-09-16 DIAGNOSIS — K7030 Alcoholic cirrhosis of liver without ascites: Secondary | ICD-10-CM | POA: Diagnosis not present

## 2019-09-16 NOTE — Progress Notes (Signed)
Primary Care Physician: Birdie Sons, MD  Primary Gastroenterologist:  Dr. Lucilla Lame  Chief Complaint  Patient presents with  . Follow up Cirrhosis    HPI: [name] is a 69 y.o. male here the patient has been doing well with his abdomen being less distended.  The patient had recent lab work that showed his potassium to be 4.9.  The patient states that he takes daily potassium in addition to other elements because he was told it can decrease his cholesterol.  The patient is concerned about his continued weight loss which a large amount of is from his ascites.  The patient is now taking both Aldactone and Lasix twice a day.  His creatinine has been stable.  Past Medical History:  Diagnosis Date  . Alcoholism ([place])   . 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 Diabetes mellitus without complication ([place])   . Dyspnea   . Elevated liver enzymes   . Esophageal varices ([place])   . Gastritis   . GERD (gastroesophageal reflux disease)   . Hypertension   . Pancreatitis     Current Outpatient Medications  Medication Sig Dispense Refill  . ACCU-CHEK FASTCLIX LANCETS MISC USE TO CHECK BLOOD SUGAR 3 TIMES A DAY 306 each 4  . allopurinol (ZYLOPRIM) 100 MG tablet Take 1 tablet (100 mg total) by mouth daily. 90 tablet 3  . 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 Calcium Citrate 200 MG TABS Take 200 mg by mouth daily. Reported on 11/21/2015    . carvedilol (COREG) 3.125 MG tablet Take 1 tablet (3.125 mg total) by mouth 2 (two) times daily. 60 tablet 11  . furosemide (LASIX) 40 MG tablet Take 1 tablet (40 mg total) by mouth 2 (two) times daily. 30 tablet 3  . gabapentin (NEURONTIN) 100 MG capsule One at night for 3 nights, then one twice a day for 3 days, then one three times daily 270 capsule 3  . glucose blood (ACCU-CHEK AVIVA PLUS) test strip Use as instructed 360  each 4  . insulin aspart (NOVOLOG FLEXPEN) 100 UNIT/ML FlexPen INJECT UP TO 10 UNITS 3    TIMES A DAY BEFORE MEALS 15 mL 3  . insulin degludec (TRESIBA FLEXTOUCH) 100 UNIT/ML SOPN FlexTouch Pen Inject up to 20 units every day at bedtime 6 pen 4  . Insulin Pen Needle (PEN NEEDLES) 31G X 5 MM MISC 1 Device by Does not apply route 3 (three) times daily. 100 each 4  . lisinopril (ZESTRIL) 10 MG tablet TAKE 1 TABLET BY MOUTH EVERY DAY 90 tablet 4  . magnesium gluconate (MAGONATE) 500 MG tablet Take 500 mg by mouth daily.     . Multiple Vitamin (MULTIVITAMIN WITH MINERALS) TABS tablet Take 1 tablet by mouth daily.    . naltrexone (DEPADE) 50 MG tablet Take 1 tablet (50 mg total) by mouth daily. 90 tablet 3  . omeprazole (PRILOSEC) 40 MG capsule TAKE 1 CAPSULE BY MOUTH EVERY DAY 90 capsule 4  . Potassium Gluconate 550 MG TABS Take 550 mg by mouth daily.     . promethazine (PHENERGAN) 25 MG tablet Take 1 tablet (25 mg total) by mouth every 8 (eight) hours as needed for nausea or vomiting. 20 tablet 0  . pyridOXINE (VITAMIN B-6) 50 MG tablet Take 50 mg by mouth daily.    . sertraline (ZOLOFT)  100 MG tablet Take 1 tablet (100 mg total) by mouth daily. 90 tablet 1  . spironolactone (ALDACTONE) 100 MG tablet Take 1 tablet (100 mg total) by mouth daily. 30 tablet 3  . spironolactone (ALDACTONE) 25 MG tablet Take 1 tablet (25 mg total) by mouth daily. TAKE IN PLACE OF LISINOPRIL 30 tablet 1  . traZODone (DESYREL) 50 MG tablet Take 50 mg by mouth at bedtime.    Marland Kitchen zinc gluconate 50 MG tablet Take 50 mg by mouth daily.    . cyclobenzaprine (FLEXERIL) 5 MG tablet Take 1 tablet (5 mg total) by mouth 3 (three) times daily as needed for muscle spasms. (Patient not taking: Reported on 07/27/2019) 30 tablet 1   Current Facility-Administered Medications  Medication Dose Route Frequency Provider Last Rate Last Admin  . albumin human 25 % solution 12.5 g  12.5 g Intravenous Once Lucilla Lame, MD        Allergies as of  09/16/2019 - Review Complete 09/16/2019  Allergen Reaction Noted  . Hydrochlorothiazide Other (See Comments) 02/28/2015    ROS:  General: Negative for anorexia, weight loss, fever, chills, fatigue, weakness. ENT: Negative for hoarseness, difficulty swallowing , nasal congestion. CV: Negative for chest pain, angina, palpitations, dyspnea on exertion, peripheral edema.  Respiratory: Negative for dyspnea at rest, dyspnea on exertion, cough, sputum, wheezing.  GI: See history of present illness. GU:  Negative for dysuria, hematuria, urinary incontinence, urinary frequency, nocturnal urination.  Endo: Negative for unusual weight change.    Physical Examination:   BP 126/71   Pulse 82   Temp 98.2 F (36.8 C) (Temporal)   Ht 5' 5"  (1.651 m)   Wt 132 lb 12.8 oz (60.2 kg)   BMI 22.10 kg/m   General: Well-nourished, well-developed in no acute distress.  Eyes: No icterus. Conjunctivae pink. Abdomen: Bowel sounds are normal, nontender, nondistended, no hepatosplenomegaly or masses, no abdominal bruits or hernia , no rebound or guarding.   Extremities: No lower extremity edema. No clubbing or deformities. Neuro: Alert and oriented x 3.  Grossly intact. Skin: Warm and dry, no jaundice.   Psych: Alert and cooperative, normal mood and affect.  Labs:    Imaging Studies: US Paracentesis  Result Date: 09/08/2019 INDICATION: Abdominal distention. Recurrent ascites. Request for therapeutic paracentesis. EXAM: ULTRASOUND GUIDED RIGHT LOWER QUADRANT PARACENTESIS MEDICATIONS: None. COMPLICATIONS: None immediate. PROCEDURE: Informed written consent was obtained from the patient after a discussion of the risks, benefits and alternatives to treatment. A timeout was performed prior to the initiation of the procedure. Initial ultrasound scanning demonstrates a large amount of ascites within the right lower abdominal quadrant. The right lower abdomen was prepped and draped in the usual sterile fashion. 1%  lidocaine was used for local anesthesia. Following this, a 6 Fr Safe-T-Centesis catheter was introduced. An ultrasound image was saved for documentation purposes. The paracentesis was performed. The catheter was removed and a dressing was applied. The patient tolerated the procedure well without immediate post procedural complication. FINDINGS: A total of approximately 2.5 L of clear, dark yellow fluid was removed. IMPRESSION: Successful ultrasound-guided paracentesis yielding 2.5 liters of peritoneal fluid. Read by: Ascencion Dike PA-C Electronically Signed   By: Marcello Moores  Register   On: 09/08/2019 11:27    Assessment and Plan:   [name] is a 69 y.o. y/o male who comes in today with a history of alcoholic cirrhosis and he has not had any further drinking.  The patient's abdominal distention from ascites is greatly improved and  he has been told to start backing off of the diuretics to see if his ascites comes back.  If it does then he should on the diuretics.  He will slowly decrease each of the medications one at a time with monitoring his ascites.  The patient has also been told to stop or decrease the potassium intake since he is borderline high and he says he will do that.  The patient has also been told that his weight loss is likely due to his ascites being better managed and that he should avoid iron supplementation, cooking with an iron skillet and admit.  Has been encouraged to get his protein through fish and chicken.  The patient has been explained the plan and agrees with it.     Lucilla Lame, MD. Marval Regal    Note: This dictation was prepared with Dragon dictation along with smaller phrase technology. Any transcriptional errors that result from this process are unintentional.

## 2019-10-05 ENCOUNTER — Other Ambulatory Visit: Payer: Self-pay | Admitting: Gastroenterology

## 2019-10-07 ENCOUNTER — Telehealth: Payer: Self-pay

## 2019-10-07 NOTE — Telephone Encounter (Signed)
Patient says he needs more of the medication we prescribed him.Says we told him to now take it twice a day instead of once. Pls consult with [facility] in [facility]

## 2019-10-08 NOTE — Telephone Encounter (Signed)
Left vm for pt to confirm correct mg on his Furosemide and Spironolactone as well has how many times daily his is currently taking.

## 2019-10-09 ENCOUNTER — Other Ambulatory Visit: Payer: Self-pay

## 2019-10-09 MED ORDER — SPIRONOLACTONE 100 MG PO TABS: 100.0000 mg | ORAL_TABLET | Freq: Every day | ORAL | 5 refills | Status: DC

## 2019-10-09 MED ORDER — FUROSEMIDE 40 MG PO TABS: 40.0000 mg | ORAL_TABLET | Freq: Two times a day (BID) | ORAL | 5 refills | Status: DC

## 2019-10-09 NOTE — Telephone Encounter (Signed)
Contacted pt to and confirmed dosage of Spironolactone and lasix. Prescriptions sent to pt's pharmacy.

## 2019-10-14 ENCOUNTER — Other Ambulatory Visit: Payer: Self-pay | Admitting: Family Medicine

## 2019-10-14 DIAGNOSIS — R110 Nausea: Secondary | ICD-10-CM

## 2019-10-14 DIAGNOSIS — M62830 Muscle spasm of back: Secondary | ICD-10-CM

## 2019-10-14 MED ORDER — ALLOPURINOL 100 MG PO TABS: 100.0000 mg | ORAL_TABLET | Freq: Every day | ORAL | 2 refills | Status: DC

## 2019-10-14 NOTE — Telephone Encounter (Signed)
Requested medication (s) are due for refill today - yes if to continue  Requested medication (s) are on the active medication list -yes  Future visit scheduled -no  Last refill: 09/15/18  Notes to clinic: Patient has changed pharmacy and is requesting 90 day supply of listed Rx- non delegated- sent for review   Requested Prescriptions  Pending Prescriptions Disp Refills   cyclobenzaprine (FLEXERIL) 5 MG tablet 30 tablet 1    Sig: Take 1 tablet (5 mg total) by mouth 3 (three) times daily as needed for muscle spasms.      Not Delegated - Analgesics:  Muscle Relaxants Failed - 10/14/2019 12:10 PM      Failed - This refill cannot be delegated      Passed - Valid encounter within last 6 months    Recent Outpatient Visits           2 months ago Abdominal swelling   [facility] Birdie Sons, MD   3 months ago Type 2 diabetes mellitus without complication, with long-term current use of insulin [facility])   [facility] Birdie Sons, MD   4 months ago No-show for appointment   [facility] Birdie Sons, MD   8 months ago Essential hypertension   [facility] Birdie Sons, MD   1 year ago Type 2 diabetes mellitus without complication, with long-term current use of insulin [facility])   [facility] Birdie Sons, MD                promethazine (PHENERGAN) 25 MG tablet 20 tablet 0    Sig: Take 1 tablet (25 mg total) by mouth every 8 (eight) hours as needed for nausea or vomiting.      Not Delegated - Gastroenterology: Antiemetics Failed - 10/14/2019 12:10 PM      Failed - This refill cannot be delegated      Passed - Valid encounter within last 6 months    Recent Outpatient Visits           2 months ago Abdominal swelling   [facility] Birdie Sons, MD   3 months ago Type 2 diabetes mellitus without complication, with long-term current use of insulin [facility])   [facility] Birdie Sons, MD   4 months ago No-show for appointment   [facility] Birdie Sons, MD   8 months ago Essential hypertension   [facility] Birdie Sons, MD   1 year ago Type 2 diabetes mellitus without complication, with long-term current use of insulin [facility])   [facility] Birdie Sons, MD               Signed Prescriptions Disp Refills   allopurinol (ZYLOPRIM) 100 MG tablet 90 tablet 2    Sig: Take 1 tablet (100 mg total) by mouth daily.      Endocrinology:  Gout Agents Failed - 10/14/2019 12:10 PM      Failed - Uric Acid in normal range and within 360 days    Uric Acid  Date Value Ref Range Status  10/02/2016 2.6 (L) 3.7 - 8.6 mg/dL Final    Comment:               Therapeutic target for gout patients: <6.0          Passed - Cr in normal range and within 360 days    Creat  Date Value Ref Range  Status  06/10/2017 0.81 0.70 - 1.25 mg/dL Final    Comment:    For patients >76 years of age, the reference limit for Creatinine is approximately 13% higher for people identified as African-American. .    Creatinine, Ser  Date Value Ref Range Status  09/09/2019 0.67 0.61 - 1.24 mg/dL Final          Passed - Valid encounter within last 12 months    Recent Outpatient Visits           2 months ago Abdominal swelling   [facility] Birdie Sons, MD   3 months ago Type 2 diabetes mellitus without complication, with long-term current use of insulin [facility])   [facility] Birdie Sons, MD   4 months ago No-show for appointment   [facility] Birdie Sons, MD   8 months ago Essential hypertension   [facility] Birdie Sons, MD   1 year ago Type 2 diabetes mellitus without complication, with long-term current use of insulin [facility])   [facility] Birdie Sons, MD                  Requested Prescriptions  Pending  Prescriptions Disp Refills   cyclobenzaprine (FLEXERIL) 5 MG tablet 30 tablet 1    Sig: Take 1 tablet (5 mg total) by mouth 3 (three) times daily as needed for muscle spasms.      Not Delegated - Analgesics:  Muscle Relaxants Failed - 10/14/2019 12:10 PM      Failed - This refill cannot be delegated      Passed - Valid encounter within last 6 months    Recent Outpatient Visits           2 months ago Abdominal swelling   [facility] Birdie Sons, MD   3 months ago Type 2 diabetes mellitus without complication, with long-term current use of insulin [facility])   [facility] Birdie Sons, MD   4 months ago No-show for appointment   [facility] Birdie Sons, MD   8 months ago Essential hypertension   [facility] Birdie Sons, MD   1 year ago Type 2 diabetes mellitus without complication, with long-term current use of insulin [facility])   [facility] Birdie Sons, MD                promethazine (PHENERGAN) 25 MG tablet 20 tablet 0    Sig: Take 1 tablet (25 mg total) by mouth every 8 (eight) hours as needed for nausea or vomiting.      Not Delegated - Gastroenterology: Antiemetics Failed - 10/14/2019 12:10 PM      Failed - This refill cannot be delegated      Passed - Valid encounter within last 6 months    Recent Outpatient Visits           2 months ago Abdominal swelling   [facility] Birdie Sons, MD   3 months ago Type 2 diabetes mellitus without complication, with long-term current use of insulin [facility])   [facility] Birdie Sons, MD   4 months ago No-show for appointment   [place], MD   8 months ago Essential hypertension   [facility] Birdie Sons, MD   1 year ago Type 2 diabetes mellitus without complication, with long-term current use of insulin ([place])  [place], MD               Signed Prescriptions Disp Refills   allopurinol (ZYLOPRIM) 100 MG tablet 90 tablet 2    Sig: Take 1 tablet (100 mg total) by mouth daily.      Endocrinology:  Gout Agents Failed - 10/14/2019 12:10 PM      Failed - Uric Acid in normal range and within 360 days    Uric Acid  Date Value Ref Range Status  10/02/2016 2.6 (L) 3.7 - 8.6 mg/dL Final    Comment:               Therapeutic target for gout patients: <6.0          Passed - Cr in normal range and within 360 days    Creat  Date Value Ref Range Status  06/10/2017 0.81 0.70 - 1.25 mg/dL Final    Comment:    For patients >41 years of age, the reference limit for Creatinine is approximately 13% higher for people identified as African-American. .    Creatinine, Ser  Date Value Ref Range Status  09/09/2019 0.67 0.61 - 1.24 mg/dL Final          Passed - Valid encounter within last 12 months    Recent Outpatient Visits           2 months ago Abdominal swelling   [facility] Birdie Sons, MD   3 months ago Type 2 diabetes mellitus without complication, with long-term current use of insulin [facility])   [facility] Birdie Sons, MD   4 months ago No-show for appointment   [place], MD   8 months ago Essential hypertension   [facility] Birdie Sons, MD   1 year ago Type 2 diabetes mellitus without complication, with long-term current use of insulin [facility])   [facility] Birdie Sons, MD

## 2019-10-14 NOTE — Telephone Encounter (Signed)
Change in pharmacy- refill forwarded to new pharmacy

## 2019-10-14 NOTE — Telephone Encounter (Signed)
Best contact: [phone] Call back request to discuss medications. Questions/need clarity.

## 2019-10-14 NOTE — Telephone Encounter (Signed)
Medication Refill - Medication: allopurinol (ZYLOPRIM) 100 MG tablet (Needs asap)  cyclobenzaprine (FLEXERIL) 5 MG tablet  promethazine (PHENERGAN) 25 MG tablet   90 day supply needed for all three of these, insurance    Has the patient contacted their pharmacy? Yes.   (Agent: If no, request that the patient contact the pharmacy for the refill.) (Agent: If yes, when and what did the pharmacy advise?)  Preferred Pharmacy (with phone number or street name):  [id number] - [place]  [address] [place] [address]  Phone: [phone] Fax: [phone]     Agent: Please be advised that RX refills may take up to 3 business days. We ask that you follow-up with your pharmacy.

## 2019-10-15 MED ORDER — CYCLOBENZAPRINE HCL 5 MG PO TABS: 5.0000 mg | ORAL_TABLET | Freq: Three times a day (TID) | ORAL | 1 refills | Status: DC | PRN

## 2019-10-15 MED ORDER — PROMETHAZINE HCL 25 MG PO TABS: 25.0000 mg | ORAL_TABLET | Freq: Three times a day (TID) | ORAL | 0 refills | Status: DC | PRN

## 2019-11-13 ENCOUNTER — Encounter: Payer: Self-pay | Admitting: Family Medicine

## 2019-11-16 ENCOUNTER — Other Ambulatory Visit: Payer: Self-pay

## 2019-11-16 ENCOUNTER — Emergency Department
Admission: EM | Admit: 2019-11-16 | Discharge: 2019-11-16 | Disposition: A | Discharge status: Home / Self Care | Payer: Medicare HMO | Attending: Emergency Medicine | Admitting: Emergency Medicine

## 2019-11-16 DIAGNOSIS — F1020 Alcohol dependence, uncomplicated: Secondary | ICD-10-CM | POA: Insufficient documentation

## 2019-11-16 DIAGNOSIS — R69 Illness, unspecified: Secondary | ICD-10-CM | POA: Diagnosis not present

## 2019-11-16 DIAGNOSIS — R456 Violent behavior: Secondary | ICD-10-CM | POA: Insufficient documentation

## 2019-11-16 DIAGNOSIS — Z5321 Procedure and treatment not carried out due to patient leaving prior to being seen by health care provider: Secondary | ICD-10-CM | POA: Diagnosis not present

## 2019-11-16 LAB — COMPREHENSIVE METABOLIC PANEL
ALT: 178 U/L — ABNORMAL HIGH (ref 0–44)
AST: 492 U/L — ABNORMAL HIGH (ref 15–41)
Albumin: 3.8 g/dL (ref 3.5–5.0)
Alkaline Phosphatase: 212 U/L — ABNORMAL HIGH (ref 38–126)
Anion gap: 9 (ref 5–15)
BUN: 13 mg/dL (ref 8–23)
CO2: 25 mmol/L (ref 22–32)
Calcium: 8 mg/dL — ABNORMAL LOW (ref 8.9–10.3)
Chloride: 104 mmol/L (ref 98–111)
Creatinine, Ser: 0.41 mg/dL — ABNORMAL LOW (ref 0.61–1.24)
GFR calc Af Amer: >60 mL/min (ref >60)
GFR calc non Af Amer: >60 mL/min (ref >60)
Glucose, Bld: 256 mg/dL — ABNORMAL HIGH (ref 70–99)
Potassium: 3.5 mmol/L (ref 3.5–5.1)
Sodium: 138 mmol/L (ref 135–145)
Total Bilirubin: 1.3 mg/dL — ABNORMAL HIGH (ref 0.3–1.2)
Total Protein: 7.1 g/dL (ref 6.5–8.1)

## 2019-11-16 LAB — CBC
HCT: 32 % — ABNORMAL LOW (ref 39.0–52.0)
Hemoglobin: 11.5 g/dL — ABNORMAL LOW (ref 13.0–17.0)
MCH: 31 pg (ref 26.0–34.0)
MCHC: 35.9 g/dL (ref 30.0–36.0)
MCV: 86.3 fL (ref 80.0–100.0)
Platelets: 138 10*3/uL — ABNORMAL LOW (ref 150–400)
RBC: 3.71 MIL/uL — ABNORMAL LOW (ref 4.22–5.81)
RDW: 14.5 % (ref 11.5–15.5)
WBC: 4.9 10*3/uL (ref 4.0–10.5)
nRBC: 0 % (ref 0.0–0.2)

## 2019-11-16 LAB — AMMONIA: Ammonia: 24 umol/L (ref 9–35)

## 2019-11-16 LAB — ETHANOL: Alcohol, Ethyl (B): 250 mg/dL — ABNORMAL HIGH (ref <10)

## 2019-11-16 MED ORDER — SODIUM CHLORIDE 0.9% FLUSH: 3.0000 mL | Freq: Once | INTRAVENOUS | Status: DC

## 2019-11-16 NOTE — ED Triage Notes (Addendum)
Per pt wife, the pt has been back drinking alcohol and has been severely altered when he drinks and in a rage, states he drinks daily and had some vodka today. States he has been getting treatment for cirrhosis. States she has never seen him so violent. Pt is calm and cooperative at present. Pt does have an unsteady gait on arrival and his wife has to assist him, pt has lost close to 50lbs in the past couple of months, pt c/o lower back and abd pain.

## 2019-11-16 NOTE — ED Notes (Signed)
Pt reports he is leaving. Pt reports he has been here for hours. Pt yelled at front desk, "Toney Tow is out".

## 2019-11-16 NOTE — ED Notes (Signed)
Pt refused to sign, reports he is leaving

## 2019-11-17 ENCOUNTER — Telehealth: Payer: Self-pay

## 2019-11-17 ENCOUNTER — Telehealth: Payer: Self-pay | Admitting: Emergency Medicine

## 2019-11-17 NOTE — Telephone Encounter (Signed)
Patient's wife Candy advised.

## 2019-11-17 NOTE — Telephone Encounter (Signed)
Patient's wife advised. She wanted to know if there is a medication patient can take to help with elevated liver enzymes. Explained to Candy that medication may not work if patient is still drinking alcohol. Please advise.

## 2019-11-17 NOTE — Telephone Encounter (Signed)
Copied from [place] [id number]. Topic: General - Other >> Nov 17, 2019 11:26 AM Yvette Rack wrote: Reason for CRM: Pt wife Freida Busman stated the hospital called and informed her to contact pcp office for lab results. Pt wife stated it is very important that her call be returned due to pt actions after drinking vodka. Cb# [phone]

## 2019-11-17 NOTE — Telephone Encounter (Signed)
His ammonia levels are normal, so it's unlikely that he has encephalopathy. His liver enzymes were very high due to drinking alcohol. He should get back into rehab.

## 2019-11-17 NOTE — Telephone Encounter (Signed)
The only thing that will help liver enzymes is to stop drinking alcohol, but the natrexone helps reduce cravings for alcohol. i'm pretty sure the patient stopped taking the naltrexone. If she thinks she can get him to to take it again you can send in a refill

## 2019-11-17 NOTE — Telephone Encounter (Signed)
Called patient due to lwot to inquire about condition and follow up plans. Spoke to family member and she says he is feeling nauseated and bottom of stomach hurts.  I asked her to call pcp back and let them know labs are available for review, and they can advise next steps.

## 2019-11-18 ENCOUNTER — Encounter: Payer: Self-pay | Admitting: Family Medicine

## 2019-12-11 ENCOUNTER — Telehealth: Payer: Self-pay | Admitting: Family Medicine

## 2019-12-11 DIAGNOSIS — E119 Type 2 diabetes mellitus without complications: Secondary | ICD-10-CM

## 2019-12-11 MED ORDER — NOVOLOG FLEXPEN 100 UNIT/ML ~~LOC~~ SOPN: PEN_INJECTOR | SUBCUTANEOUS | 0 refills | Status: DC

## 2019-12-11 MED ORDER — TRESIBA FLEXTOUCH 100 UNIT/ML ~~LOC~~ SOPN: PEN_INJECTOR | SUBCUTANEOUS | 0 refills | Status: DC

## 2019-12-11 NOTE — Telephone Encounter (Signed)
Pt advised.  Apt 01/22/2020 at 11am.  Thanks,    -Mickel Baas

## 2019-12-11 NOTE — Addendum Note (Signed)
Addended by: Randal Buba on: 12/11/2019 12:06 PM   Modules accepted: Orders

## 2019-12-11 NOTE — Addendum Note (Signed)
Addended by: Lelon Huh E on: 12/11/2019 01:16 PM   Modules accepted: Orders

## 2019-12-11 NOTE — Telephone Encounter (Signed)
Pharmacist says patient stated he is 16 units three times a day and he is requesting 90 days of insulin.  [name] needs to be updated for a 90 day prescription also  Call back # [phone] Fax # [phone]

## 2019-12-11 NOTE — Telephone Encounter (Signed)
Please review refill request and message below. Patient told the pharmacy that he uses 16 units TID of Novolog. These directions are different from what is documented in chart.

## 2019-12-11 NOTE — Telephone Encounter (Signed)
Please advise have sent prescription to mail order, but he is due for follow up and needs to schedule this month.

## 2019-12-28 ENCOUNTER — Other Ambulatory Visit: Payer: Self-pay | Admitting: Family Medicine

## 2019-12-28 NOTE — Progress Notes (Signed)
No answer apt. This encounter was created in error - please disregard.

## 2019-12-28 NOTE — Telephone Encounter (Signed)
Requested Prescriptions  Pending Prescriptions Disp Refills  . sertraline (ZOLOFT) 100 MG tablet [Pharmacy Med Name: SERTRALINE HCL 100 MG TABLET] 90 tablet 0    Sig: TAKE 1 TABLET BY MOUTH EVERY DAY     Psychiatry:  Antidepressants - SSRI Failed - 12/28/2019 11:42 AM      Failed - Completed PHQ-2 or PHQ-9 in the last 360 days.      Passed - Valid encounter within last 6 months    Recent Outpatient Visits          5 months ago Abdominal swelling   [facility] Birdie Sons, MD   6 months ago Type 2 diabetes mellitus without complication, with long-term current use of insulin [facility])   [facility] Birdie Sons, MD   7 months ago No-show for appointment   [facility] Birdie Sons, MD   11 months ago Essential hypertension   [facility] Birdie Sons, MD   1 year ago Type 2 diabetes mellitus without complication, with long-term current use of insulin [facility])   [facility] Birdie Sons, MD      Future Appointments            In 3 weeks Fisher, [name], MD [facility] [place]

## 2019-12-29 ENCOUNTER — Other Ambulatory Visit: Payer: Self-pay

## 2020-01-22 ENCOUNTER — Ambulatory Visit: Payer: Medicare HMO | Admitting: Family Medicine

## 2020-01-25 NOTE — Progress Notes (Signed)
Established patient visit   Patient: [name]   DOB: 1951/01/30   69 y.o. Male  MRN: [id number] Visit Date: 01/26/2020  Today's healthcare provider: Lelon Huh, MD   Chief Complaint  Patient presents with  . Diabetes  . Hypertension   Subjective    HPI  Diabetes Mellitus Type II, Follow-up  Lab Results  Component Value Date   HGBA1C 11.2 (A) 01/26/2020   HGBA1C 7.1 (A) 07/27/2019   HGBA1C 8.8 (A) 01/19/2019   Wt Readings from Last 3 Encounters:  01/26/20 141 lb 9.6 oz (64.2 kg)  09/16/19 132 lb 12.8 oz (60.2 kg)  08/21/19 164 lb (74.4 kg)   Last seen for diabetes 6 months ago.  During the last visit patient was out of Basaglar due to it being expensive. He was instructed to check his insurance formulary for preferred alternatives. Patient was given one sample Pen of Tresiba to take in it's place. He reports excellent compliance with treatment. He is not having side effects.  Symptoms: No fatigue No foot ulcerations  No appetite changes No nausea  No paresthesia of the feet  No polydipsia  No polyuria No visual disturbances   No vomiting     Home blood sugar records: fasting range: high this morning 300  Episodes of hypoglycemia? No    Current insulin regiment: 16 units basaglar at night and Novolog 20 units before meals Most Recent Eye Exam: 09/02/2019 Current exercise: walking Current diet habits: in general, a "healthy" diet    Pertinent Labs: Lab Results  Component Value Date   CHOL 115 09/15/2018   HDL 52 09/15/2018   LDLCALC 49 09/15/2018   TRIG 68 09/15/2018   CHOLHDL 2.2 09/15/2018   Lab Results  Component Value Date   NA 138 11/16/2019   K 3.5 11/16/2019   CREATININE 0.41 (L) 11/16/2019   GFRNONAA >60 11/16/2019   GFRAA >60 11/16/2019   GLUCOSE 256 (H) 11/16/2019     ---------------------------------------------------------------------------------------------------  Hypertension, follow-up  BP Readings from Last 3  Encounters:  01/26/20 103/68  09/16/19 126/71  09/08/19 129/81   Wt Readings from Last 3 Encounters:  01/26/20 141 lb 9.6 oz (64.2 kg)  09/16/19 132 lb 12.8 oz (60.2 kg)  08/21/19 164 lb (74.4 kg)     He was last seen for hypertension 1 year ago.  BP at that visit was 142/95. Management since that visit includes continue same medications.  He reports excellent compliance with treatment. He is not having side effects.  He is following a Regular diet. He is exercising. He does not smoke.  Use of agents associated with hypertension: none.   Outside blood pressures are stable. Symptoms: No chest pain No chest pressure  No palpitations No syncope  No dyspnea No orthopnea  No paroxysmal nocturnal dyspnea No lower extremity edema   Pertinent labs: Lab Results  Component Value Date   CHOL 115 09/15/2018   HDL 52 09/15/2018   LDLCALC 49 09/15/2018   TRIG 68 09/15/2018   CHOLHDL 2.2 09/15/2018   Lab Results  Component Value Date   NA 138 11/16/2019   K 3.5 11/16/2019   CREATININE 0.41 (L) 11/16/2019   GFRNONAA >60 11/16/2019   GFRAA >60 11/16/2019   GLUCOSE 256 (H) 11/16/2019     The ASCVD Risk score (Goff DC Jr., et al., 2013) failed to calculate for the following reasons:   The valid total cholesterol range is 130 to 320 mg/dL   --------------------------------------------------------------------------------------------------- Patient Active  Problem List   Diagnosis Date Noted  . Gastric varices   . Hyperuricemia 10/02/2016  . Alcohol abuse 03/30/2015  . Dermatitis, eczematoid 03/20/2015  . GERD (gastroesophageal reflux disease) 03/20/2015  . History of Helicobacter pylori infection 03/20/2015  . Hearing loss of right ear 03/20/2015  . Hernia, inguinal, left 03/20/2015  . Supraspinatus sprain and strain 03/20/2015  . Cirrhosis ([place]) 03/20/2015  . Scalp laceration 02/28/2015  . Rib contusion 02/28/2015  . Splenic vein thrombosis   . History of portal  hypertension   . Blood clotting disorder ([place]) 02/11/2014  . Odynophagia 02/10/2014  . Chronic pancreatitis ([place]) 12/08/2013  . Abnormal liver enzymes 12/08/2013  . Esophagitis 12/08/2013  . Gastric catarrh 12/08/2013  . H/O adenomatous polyp of colon 09/23/2012  . Esophageal varices ([place]) 09/09/2012  . DDD (degenerative disc disease), cervical 08/17/2011  . Vitamin D deficiency 08/19/2009  . Diabetes ([place]) 10/01/2007  . 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 Depression 05/13/2007  . 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 Obesity 07/09/2005  . Hypertension 07/09/1998   Social History   Tobacco Use  . Smoking status: Never Smoker  . Smokeless tobacco: Never Used  Vaping Use  . Vaping Use: Never used  Substance Use Topics  . Alcohol use: Yes    Alcohol/week: 0.0 standard drinks    Comment: daily   . Drug use: No    Follow up alcohol abuse/hepatic cirrhosis He states he has consumed any alcohol for 3 or 4 months and has no intention of starting to drink again. He generally feels good. Is due for annul [place] screening, but already had ultrasound earlier this year.   He also states he has occasional back pain and requests refill for Flexeril which he has been out of for several months.   Medications: Outpatient Medications Prior to Visit  Medication Sig  . ACCU-CHEK FASTCLIX LANCETS MISC USE TO CHECK BLOOD SUGAR 3 TIMES A DAY  . allopurinol (ZYLOPRIM) 100 MG tablet Take 1 tablet (100 mg total) by mouth daily.  . 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 Calcium Citrate 200 MG TABS Take 200 mg by mouth daily. Reported on 11/21/2015  . carvedilol (COREG) 3.125 MG tablet Take 1 tablet (3.125 mg total) by mouth 2 (two) times daily.  . furosemide (LASIX) 40 MG tablet Take 1 tablet (40 mg total) by mouth 2 (two) times daily.    Mar[place] Kitchen glucose blood (ACCU-CHEK AVIVA PLUS) test strip Use as instructed  . insulin aspart (NOVOLOG FLEXPEN) 100 UNIT/ML FlexPen INJECT UP TO 20 UNITS THREE TIMES A DAY BEFORE MEALS  . insulin degludec (TRESIBA FLEXTOUCH) 100 UNIT/ML FlexTouch Pen Inject up to 20 units every day at bedtime  . Insulin Pen Needle (PEN NEEDLES) 31G X 5 MM MISC 1 Device by Does not apply route 3 (three) times daily.  Marland Kitchen lisinopril (ZESTRIL) 10 MG tablet TAKE 1 TABLET BY MOUTH EVERY DAY  . magnesium gluconate (MAGONATE) 500 MG tablet Take 500 mg by mouth daily.   . Multiple Vitamin (MULTIVITAMIN WITH MINERALS) TABS tablet Take 1 tablet by mouth daily.  . naltrexone (DEPADE) 50 MG tablet Take 1 tablet (50 mg total) by mouth daily.  Marland Kitchen omeprazole (PRILOSEC) 40 MG capsule TAKE 1 CAPSULE BY MOUTH EVERY DAY  . Potassium Gluconate 550 MG TABS Take 550 mg by mouth daily.   Marland Kitchen  promethazine (PHENERGAN) 25 MG tablet Take 1 tablet (25 mg total) by mouth every 8 (eight) hours as needed for nausea or vomiting.  . pyridOXINE (VITAMIN B-6) 50 MG tablet Take 50 mg by mouth daily.  . sertraline (ZOLOFT) 100 MG tablet TAKE 1 TABLET BY MOUTH EVERY DAY  . spironolactone (ALDACTONE) 100 MG tablet Take 1 tablet (100 mg total) by mouth daily.  Marland Kitchen zinc gluconate 50 MG tablet Take 50 mg by mouth daily.  Marland Kitchen gabapentin (NEURONTIN) 100 MG capsule One at night for 3 nights, then one twice a day for 3 days, then one three times daily (Patient not taking: Reported on 01/26/2020)  . traZODone (DESYREL) 50 MG tablet Take 50 mg by mouth at bedtime. (Patient not taking: Reported on 01/26/2020)  . [DISCONTINUED] cyclobenzaprine (FLEXERIL) 5 MG tablet Take 1 tablet (5 mg total) by mouth 3 (three) times daily as needed for muscle spasms.   Facility-Administered Medications Prior to Visit  Medication Dose Route Frequency Provider  . albumin human 25 % solution 12.5 g  12.5 g Intravenous Once Lucilla Lame, MD    Review of Systems  Constitutional: Negative for  appetite change and fatigue.  Respiratory: Negative for chest tightness, shortness of breath and wheezing.   Cardiovascular: Negative for chest pain and palpitations.      Objective    BP 103/68 (BP Location: Left Arm, Patient Position: Sitting, Cuff Size: Normal)   Pulse 72   Temp (!) 97.3 F (36.3 C) (Temporal)   Resp 16   Wt 141 lb 9.6 oz (64.2 kg)   BMI 23.56 kg/m  BP Readings from Last 3 Encounters:  01/26/20 103/68  09/16/19 126/71  09/08/19 129/81   Wt Readings from Last 3 Encounters:  01/26/20 141 lb 9.6 oz (64.2 kg)  09/16/19 132 lb 12.8 oz (60.2 kg)  08/21/19 164 lb (74.4 kg)      Physical Exam  General appearance: Well developed, well nourished male, cooperative and in no acute distress Head: Normocephalic, without obvious abnormality, atraumatic Respiratory: Respirations even and unlabored, normal respiratory rate Extremities: All extremities are intact.  Skin: Skin color, texture, turgor normal. No rashes seen  Psych: Appropriate mood and affect. Neurologic: Mental status: Alert, oriented to person, place, and time, thought content appropriate.   Results for orders placed or performed in visit on 01/26/20  POCT HgB A1C  Result Value Ref Range   Hemoglobin A1C 11.2 (A) 4.0 - 5.6 %   Est. average glucose Bld gHb Est-mCnc 275     Assessment & Plan     1. Type 2 diabetes mellitus with hyperglycemia, with long-term current use of insulin (HCC) Increase basal insulin to 24 units, continue SSI. Recheck in 3 months.  - insulin aspart (NOVOLOG FLEXPEN) 100 UNIT/ML FlexPen; INJECT UP TO 20 UNITS THREE TIMES A DAY BEFORE MEALS  Dispense: 45 mL; Refill: 0 - Insulin Glargine (BASAGLAR KWIKPEN) 100 UNIT/ML; Inject 0.24 mLs (24 Units total) into the skin daily. INJECT UP TO 20 UNITS      SUBCUTANEOUSLY AT BEDTIME  Dispense: 5 pen; Refill: 3   2. Back spasm refill - cyclobenzaprine (FLEXERIL) 5 MG tablet; Take 1 tablet (5 mg total) by mouth 3 (three) times daily as  needed for muscle spasms.  Dispense: 30 tablet; Refill: 1  3. Vitamin D deficiency Due to check  - VITAMIN D 25 Hydroxy (Vit-D Deficiency, Fractures)  4. Alcoholic cirrhosis of liver without ascites ([place]) Reinforced importance of continue abstinence from alcohol  - AFP  tumor marker - Comprehensive metabolic panel - CBC Check hepatitis serology to see if vaccines are needed. He states he has already had Covid vaccine.   5. Alcohol abuse Continue naltrexone.   6. Secondary esophageal varices without bleeding (HCC) Currently asymptomatic. On Betablocker   No follow-ups on file.      The entirety of the information documented in the History of Present Illness, Review of Systems and Physical Exam were personally obtained by me. Portions of this information were initially documented by the CMA and reviewed by me for thoroughness and accuracy.      Lelon Huh, MD  [facility] [phone] (phone) [phone] (fax)  [place]

## 2020-01-26 ENCOUNTER — Encounter: Payer: Self-pay | Admitting: Family Medicine

## 2020-01-26 ENCOUNTER — Ambulatory Visit (INDEPENDENT_AMBULATORY_CARE_PROVIDER_SITE_OTHER): Payer: Medicare HMO | Admitting: Family Medicine

## 2020-01-26 ENCOUNTER — Other Ambulatory Visit: Payer: Self-pay

## 2020-01-26 VITALS — BP 103/68 | HR 72 | Temp 97.3°F | Resp 16 | Wt 141.6 lb

## 2020-01-26 DIAGNOSIS — Z794 Long term (current) use of insulin: Secondary | ICD-10-CM

## 2020-01-26 DIAGNOSIS — I8510 Secondary esophageal varices without bleeding: Secondary | ICD-10-CM | POA: Diagnosis not present

## 2020-01-26 DIAGNOSIS — K7030 Alcoholic cirrhosis of liver without ascites: Secondary | ICD-10-CM

## 2020-01-26 DIAGNOSIS — F1010 Alcohol abuse, uncomplicated: Secondary | ICD-10-CM

## 2020-01-26 DIAGNOSIS — R69 Illness, unspecified: Secondary | ICD-10-CM | POA: Diagnosis not present

## 2020-01-26 DIAGNOSIS — E1165 Type 2 diabetes mellitus with hyperglycemia: Secondary | ICD-10-CM | POA: Diagnosis not present

## 2020-01-26 DIAGNOSIS — E559 Vitamin D deficiency, unspecified: Secondary | ICD-10-CM | POA: Diagnosis not present

## 2020-01-26 DIAGNOSIS — M62830 Muscle spasm of back: Secondary | ICD-10-CM

## 2020-01-26 LAB — POCT GLYCOSYLATED HEMOGLOBIN (HGB A1C)
Est. average glucose Bld gHb Est-mCnc: 275
Hemoglobin A1C: 11.2 % — AB (ref 4.0–5.6)

## 2020-01-26 MED ORDER — CYCLOBENZAPRINE HCL 5 MG PO TABS: 5.0000 mg | ORAL_TABLET | Freq: Three times a day (TID) | ORAL | 1 refills | Status: DC | PRN

## 2020-01-26 MED ORDER — BASAGLAR KWIKPEN 100 UNIT/ML ~~LOC~~ SOPN: 24.0000 [IU] | PEN_INJECTOR | Freq: Every day | SUBCUTANEOUS | 3 refills | Status: DC

## 2020-01-26 MED ORDER — NOVOLOG FLEXPEN 100 UNIT/ML ~~LOC~~ SOPN: PEN_INJECTOR | SUBCUTANEOUS | 0 refills | Status: DC

## 2020-01-26 NOTE — Patient Instructions (Addendum)
Please go to the lab draw station in [address] on the second floor of [facility]  when you are fasting for 8 hours. Normal hours are 8:00am to 12:30pm and 1:30pm to 4:00pm Monday through Friday   Increase Basaglar to 24 units every evening and continue current dose of Novolog insulin

## 2020-01-29 DIAGNOSIS — R69 Illness, unspecified: Secondary | ICD-10-CM | POA: Diagnosis not present

## 2020-01-29 DIAGNOSIS — E559 Vitamin D deficiency, unspecified: Secondary | ICD-10-CM | POA: Diagnosis not present

## 2020-01-29 DIAGNOSIS — E119 Type 2 diabetes mellitus without complications: Secondary | ICD-10-CM | POA: Diagnosis not present

## 2020-01-30 LAB — COMPREHENSIVE METABOLIC PANEL
ALT: 24 IU/L (ref 0–44)
AST: 29 IU/L (ref 0–40)
Albumin/Globulin Ratio: 1.6 (ref 1.2–2.2)
Albumin: 4.5 g/dL (ref 3.8–4.8)
Alkaline Phosphatase: 257 IU/L — ABNORMAL HIGH (ref 48–121)
BUN/Creatinine Ratio: 27 — ABNORMAL HIGH (ref 10–24)
BUN: 24 mg/dL (ref 8–27)
Bilirubin Total: 0.5 mg/dL (ref 0.0–1.2)
CO2: 25 mmol/L (ref 20–29)
Calcium: 9.9 mg/dL (ref 8.6–10.2)
Chloride: 92 mmol/L — ABNORMAL LOW (ref 96–106)
Creatinine, Ser: 0.9 mg/dL (ref 0.76–1.27)
GFR calc Af Amer: 100 mL/min/{1.73_m2} (ref >59)
GFR calc non Af Amer: 87 mL/min/{1.73_m2} (ref >59)
Globulin, Total: 2.9 g/dL (ref 1.5–4.5)
Glucose: 326 mg/dL — ABNORMAL HIGH (ref 65–99)
Potassium: 5.4 mmol/L — ABNORMAL HIGH (ref 3.5–5.2)
Sodium: 130 mmol/L — ABNORMAL LOW (ref 134–144)
Total Protein: 7.4 g/dL (ref 6.0–8.5)

## 2020-01-30 LAB — VITAMIN D 25 HYDROXY (VIT D DEFICIENCY, FRACTURES): Vit D, 25-Hydroxy: 27 ng/mL — ABNORMAL LOW (ref 30.0–100.0)

## 2020-01-30 LAB — CBC
Hematocrit: 40.2 % (ref 37.5–51.0)
Hemoglobin: 12.9 g/dL — ABNORMAL LOW (ref 13.0–17.7)
MCH: 28.4 pg (ref 26.6–33.0)
MCHC: 32.1 g/dL (ref 31.5–35.7)
MCV: 89 fL (ref 79–97)
Platelets: 299 10*3/uL (ref 150–450)
RBC: 4.54 x10E6/uL (ref 4.14–5.80)
RDW: 15.8 % — ABNORMAL HIGH (ref 11.6–15.4)
WBC: 4.7 10*3/uL (ref 3.4–10.8)

## 2020-01-30 LAB — HEPATITIS A ANTIBODY, TOTAL: hep A Total Ab: NEGATIVE

## 2020-01-30 LAB — HEPATITIS B SURFACE ANTIBODY,QUALITATIVE: Hep B Surface Ab, Qual: NONREACTIVE

## 2020-01-30 LAB — AFP TUMOR MARKER: AFP, Serum, Tumor Marker: 1.8 ng/mL (ref 0.0–8.3)

## 2020-02-01 ENCOUNTER — Encounter: Payer: Self-pay | Admitting: Family Medicine

## 2020-02-17 ENCOUNTER — Ambulatory Visit: Payer: Self-pay | Admitting: *Deleted

## 2020-02-17 NOTE — Telephone Encounter (Signed)
Blood sugars over 300 for about one month. Today, 384 and now 347. Denies any/all symptoms but frequent urination he states he has all the time and is taking diuretics. Taking up to 20 units Novolog 3x day and Lantus 20 units at HS. Has begun taking Metformin 500 mg in the am and pm. Does report eating a lot of sweets made with splenda. Reviewed urgent symptoms to monitor for and seek immediate treatment for. Advised increasing water intake and no sweets/decreased carbohydrates for now.  Patient stated understanding advice and agreed. Appointment with Dr. Jacinto Reap for 02/19/20. Reason for Disposition . [1] Blood glucose > 300 mg/dL (16.7 mmol/L) AND [2] two or more times in a row  Answer Assessment - Initial Assessment Questions 1. BLOOD GLUCOSE: "What is your blood glucose level?"      This morning cbg was 348. 2. ONSET: "When did you check the blood glucose?"     8am this morning. 3. USUAL RANGE: "What is your glucose level usually?" (e.g., usual fasting morning value, usual evening value)     Has been in the 200's lately. 4. KETONES: "Do you check for ketones (urine or blood test strips)?" If yes, ask: "What does the test show now?"      no 5. TYPE 1 or 2:  "Do you know what type of diabetes you have?"  (e.g., Type 1, Type 2, Gestational; doesn't know)      Type 2 6. INSULIN: "Do you take insulin?" "What type of insulin(s) do you use? What is the mode of delivery? (syringe, pen; injection or pump)?"      Novolog SSI but he will take up to 20 units at breakfast and dinner.Lantus 20 units at bedtime. 7. DIABETES PILLS: "Do you take any pills for your diabetes?" If yes, ask: "Have you missed taking any pills recently?"     Has been taking Metformin 500 mg twice day for 3 days now. 8. OTHER SYMPTOMS: "Do you have any symptoms?" (e.g., fever, frequent urination, difficulty breathing, dizziness, weakness, vomiting)     Has frequent urination but is taking 2 dieretics. 9. PREGNANCY: "Is there any chance  you are pregnant?" "When was your last menstrual period?"     na  Protocols used: DIABETES - HIGH BLOOD SUGAR-A-AH

## 2020-02-19 ENCOUNTER — Ambulatory Visit: Payer: Self-pay | Admitting: Family Medicine

## 2020-02-19 ENCOUNTER — Encounter: Payer: Self-pay | Admitting: Family Medicine

## 2020-02-19 ENCOUNTER — Ambulatory Visit (INDEPENDENT_AMBULATORY_CARE_PROVIDER_SITE_OTHER): Payer: Medicare HMO | Admitting: Family Medicine

## 2020-02-19 ENCOUNTER — Other Ambulatory Visit: Payer: Self-pay

## 2020-02-19 VITALS — BP 89/52 | HR 80 | Temp 99.4°F | Resp 16 | Wt 147.6 lb

## 2020-02-19 DIAGNOSIS — E1165 Type 2 diabetes mellitus with hyperglycemia: Secondary | ICD-10-CM

## 2020-02-19 DIAGNOSIS — Z794 Long term (current) use of insulin: Secondary | ICD-10-CM

## 2020-02-19 DIAGNOSIS — N62 Hypertrophy of breast: Secondary | ICD-10-CM | POA: Diagnosis not present

## 2020-02-19 NOTE — Patient Instructions (Addendum)
Increase Basaglar to 30 units nightly.  After 3 days, if fasting blood sugar remains above 200, increase by 2 additional units.  Continue Novolog as prescribed.  May consider adding additional sliding scale in the future.  Talk to Dr Allen Norris about alternatives to spironolactone given gynecomastia (breast size increasing)   Gynecomastia, Adult Gynecomastia is an overgrowth of gland tissue in a man's breasts. This may cause one or both breasts to become enlarged. The condition often develops in men who have an imbalance of the male sex hormone (testosterone) and the male sex hormone (estrogen). This means that a man may have too much estrogen, too little testosterone, or both. Gynecomastia may be a normal part of aging for some men. It can also happen to adolescent boys during puberty. What are the causes? This condition may be caused by:  Certain medicines, such as: ? Estrogen supplements and medicines that act like estrogen in the body. ? Medicines that keep testosterone from functioning normally in the body (testosterone-inhibiting drugs). ? Anabolic steroids. ? Medicines to treat heartburn, cancer, heart disease, mental health problems, HIV, or AIDS. ? Antibiotic medicine. ? Chemotherapy medicine.  Recreational drugs, including alcohol, marijuana, and opioids.  Herbal products, including lavender and tea tree oil.  A gene that is passed from parent to child (inherited).  Certain medical conditions, such as: ? Tumors in the pituitary or adrenal gland. ? An overactive thyroid gland. ? Certain inherited disorders, including a genetic disease that causes low testosterone in males (Klinefelter syndrome). ? Cancer of the lung, kidney, liver, testicle, or gastrointestinal tract. ? Conditions that cause liver or kidney failure. ? Poor nutrition and starvation. ? Testicle shrinking or failure (testicularatrophy). In some cases, the cause may not be known. What increases the risk? The  following factors may make you more likely to develop this condition:  Being 80 years old or older.  Being overweight.  Abusing alcohol or other drugs.  Having a family history of gynecomastia. What are the signs or symptoms? In most cases, breast enlargement is the only symptom. The enlargement may start near the nipple, and the breast tissue may feel firm and rubbery. Other symptoms may include:  Pain or tenderness in the breasts.  Itchy breasts. How is this diagnosed? This condition may be diagnosed based on:  Your symptoms and medical history.  A physical exam.  Imaging tests, such as: ? An ultrasound. ? A mammogram. ? An MRI.  Blood tests.  Removal of a sample of breast tissue to be tested in a lab (biopsy). How is this treated? This condition may go away on its own, without treatment. If gynecomastia is caused by a medical problem or drug abuse, treatment may include:  Getting treatment for the underlying medical problem or for drug abuse.  Changing or stopping medicines.  Medicines to block the effects of estrogen.  Taking a testosterone replacement.  Surgery to remove breast tissue or any lumps in your breasts.  Breast reduction surgery. This may be an option if you have severe or painful gynecomastia. Follow these instructions at home:   Take over-the-counter and prescription medicines only as told by your health care provider.  Talk to your health care provider before taking any herbal medicines or diet supplements.  Do not abuse drugs or alcohol.  Keep all follow-up visits as told by your health care provider. This is important. Contact a health care provider if:  Your breast tissue grows larger or gets more swollen or painful.  You have  a lump in your testicle.  You have blood or discharge coming from your nipples.  Your nipple changes shape.  You develop a hard or painful lump in your breast. Summary  Gynecomastia is an overgrowth of  gland tissue in a man's breasts. This may cause one or both breasts to become enlarged.  In most cases, breast enlargement is the only symptom. The enlargement may start near the nipple, and the breast tissue may feel firm and rubbery.  This condition may go away on its own, without treatment. In some cases, treatment for an underlying medical problem or for drug abuse may be needed.  Take over-the-counter and prescription medicines only as told by your health care provider.  Do not abuse drugs or alcohol. This information is not intended to replace advice given to you by your health care provider. Make sure you discuss any questions you have with your health care provider. Document Revised: 12/18/2018 Document Reviewed: 12/18/2018 Elsevier Patient Education  [place].  Diabetes Mellitus and Nutrition, Adult When you have diabetes (diabetes mellitus), it is very important to have healthy eating habits because your blood sugar (glucose) levels are greatly affected by what you eat and drink. Eating healthy foods in the appropriate amounts, at about the same times every day, can help you:  Control your blood glucose.  Lower your risk of heart disease.  Improve your blood pressure.  Reach or maintain a healthy weight. Every person with diabetes is different, and each person has different needs for a meal plan. Your health care provider may recommend that you work with a diet and nutrition specialist (dietitian) to make a meal plan that is best for you. Your meal plan may vary depending on factors such as:  The calories you need.  The medicines you take.  Your weight.  Your blood glucose, blood pressure, and cholesterol levels.  Your activity level.  Other health conditions you have, such as heart or kidney disease. How do carbohydrates affect me? Carbohydrates, also called carbs, affect your blood glucose level more than any other type of food. Eating carbs naturally raises  the amount of glucose in your blood. Carb counting is a method for keeping track of how many carbs you eat. Counting carbs is important to keep your blood glucose at a healthy level, especially if you use insulin or take certain oral diabetes medicines. It is important to know how many carbs you can safely have in each meal. This is different for every person. Your dietitian can help you calculate how many carbs you should have at each meal and for each snack. Foods that contain carbs include:  Bread, cereal, rice, pasta, and crackers.  Potatoes and corn.  Peas, beans, and lentils.  Milk and yogurt.  Fruit and juice.  Desserts, such as cakes, cookies, ice cream, and candy. How does alcohol affect me? Alcohol can cause a sudden decrease in blood glucose (hypoglycemia), especially if you use insulin or take certain oral diabetes medicines. Hypoglycemia can be a life-threatening condition. Symptoms of hypoglycemia (sleepiness, dizziness, and confusion) are similar to symptoms of having too much alcohol. If your health care provider says that alcohol is safe for you, follow these guidelines:  Limit alcohol intake to no more than 1 drink per day for nonpregnant women and 2 drinks per day for men. One drink equals 12 oz of beer, 5 oz of wine, or 1 oz of hard liquor.  Do not drink on an empty stomach.  Keep  yourself hydrated with water, diet soda, or unsweetened iced tea.  Keep in mind that regular soda, juice, and other mixers may contain a lot of sugar and must be counted as carbs. What are tips for following this plan?  Reading food labels  Start by checking the serving size on the "Nutrition Facts" label of packaged foods and drinks. The amount of calories, carbs, fats, and other nutrients listed on the label is based on one serving of the item. Many items contain more than one serving per package.  Check the total grams (g) of carbs in one serving. You can calculate the number of  servings of carbs in one serving by dividing the total carbs by 15. For example, if a food has 30 g of total carbs, it would be equal to 2 servings of carbs.  Check the number of grams (g) of saturated and trans fats in one serving. Choose foods that have low or no amount of these fats.  Check the number of milligrams (mg) of salt (sodium) in one serving. Most people should limit total sodium intake to less than 2,300 mg per day.  Always check the nutrition information of foods labeled as "low-fat" or "nonfat". These foods may be higher in added sugar or refined carbs and should be avoided.  Talk to your dietitian to identify your daily goals for nutrients listed on the label. Shopping  Avoid buying canned, premade, or processed foods. These foods tend to be high in fat, sodium, and added sugar.  Shop around the outside edge of the grocery store. This includes fresh fruits and vegetables, bulk grains, fresh meats, and fresh dairy. Cooking  Use low-heat cooking methods, such as baking, instead of high-heat cooking methods like deep frying.  Cook using healthy oils, such as olive, canola, or sunflower oil.  Avoid cooking with butter, cream, or high-fat meats. Meal planning  Eat meals and snacks regularly, preferably at the same times every day. Avoid going long periods of time without eating.  Eat foods high in fiber, such as fresh fruits, vegetables, beans, and whole grains. Talk to your dietitian about how many servings of carbs you can eat at each meal.  Eat 4-6 ounces (oz) of lean protein each day, such as lean meat, chicken, fish, eggs, or tofu. One oz of lean protein is equal to: ? 1 oz of meat, chicken, or fish. ? 1 egg. ?  cup of tofu.  Eat some foods each day that contain healthy fats, such as avocado, nuts, seeds, and fish. Lifestyle  Check your blood glucose regularly.  Exercise regularly as told by your health care provider. This may include: ? 150 minutes of  moderate-intensity or vigorous-intensity exercise each week. This could be brisk walking, biking, or water aerobics. ? Stretching and doing strength exercises, such as yoga or weightlifting, at least 2 times a week.  Take medicines as told by your health care provider.  Do not use any products that contain nicotine or tobacco, such as cigarettes and e-cigarettes. If you need help quitting, ask your health care provider.  Work with a Social worker or diabetes educator to identify strategies to manage stress and any emotional and social challenges. Questions to ask a health care provider  Do I need to meet with a diabetes educator?  Do I need to meet with a dietitian?  What number can I call if I have questions?  When are the best times to check my blood glucose? Where to find more information:  American Diabetes Association: diabetes.org  Academy of Nutrition and Dietetics: www.eatright.[employer] of Diabetes and Digestive and Kidney Diseases (NIH): [employer] Summary  A healthy meal plan will help you control your blood glucose and maintain a healthy lifestyle.  Working with a diet and nutrition specialist (dietitian) can help you make a meal plan that is best for you.  Keep in mind that carbohydrates (carbs) and alcohol have immediate effects on your blood glucose levels. It is important to count carbs and to use alcohol carefully. This information is not intended to replace advice given to you by your health care provider. Make sure you discuss any questions you have with your health care provider. Document Revised: 06/07/2017 Document Reviewed: 07/30/2016 Elsevier Patient Education  2020 [employer].

## 2020-02-19 NOTE — Progress Notes (Signed)
Established patient visit   Patient: [name]   DOB: 16-Oct-1950   69 y.o. Male  MRN: [id number] Visit Date: 02/19/2020  Today's healthcare provider: Lavon Paganini, MD   I,Sulibeya S Dimas,acting as a scribe for Lavon Paganini, MD.,have documented all relevant documentation on the behalf of Lavon Paganini, MD,as directed by  Lavon Paganini, MD while in the presence of Lavon Paganini, MD.  Chief Complaint  Patient presents with   Diabetes   Subjective    HPI  Diabetes Mellitus Type II, Follow-up  Lab Results  Component Value Date   HGBA1C 11.2 (A) 01/26/2020   HGBA1C 7.1 (A) 07/27/2019   HGBA1C 8.8 (A) 01/19/2019   Wt Readings from Last 3 Encounters:  02/19/20 147 lb 9.6 oz (67 kg)  01/26/20 141 lb 9.6 oz (64.2 kg)  09/16/19 132 lb 12.8 oz (60.2 kg)   Last seen for diabetes 3 weeks ago.  Management since then includes .Increase basal insulin to 24 units, continue SSI He reports excellent compliance with treatment. He is not having side effects.  Symptoms: No fatigue No foot ulcerations  Yes appetite changes No nausea  No paresthesia of the feet  No polydipsia  No polyuria No visual disturbances   No vomiting     Home blood sugar records: fasting range: over 300, this morning 365.   Episodes of hypoglycemia? No    Current insulin regiment: 24 units Basaglar, and Novolog 20 units three times a day before meal. Most Recent Eye Exam: UTD Current exercise: gardening, walking and yard work Current diet habits: in general, a "healthy" diet    Pertinent Labs: Lab Results  Component Value Date   CHOL 115 09/15/2018   HDL 52 09/15/2018   LDLCALC 49 09/15/2018   TRIG 68 09/15/2018   CHOLHDL 2.2 09/15/2018   Lab Results  Component Value Date   NA 130 (L) 01/29/2020   K 5.4 (H) 01/29/2020   CREATININE 0.90 01/29/2020   GFRNONAA 87 01/29/2020   GFRAA 100 01/29/2020   GLUCOSE 326 (H) 01/29/2020      ---------------------------------------------------------------------------------------------------  Patient reports new breast bud development with tenderness and fullness under both nipples over the last 2 months.  He has been taking spironolactone for about 1 year.  He has never had this previously.  He denies any nipple discharge, axillary adenopathy, fever.  He has had some muscle loss over the last few years as well.  Patient Active Problem List   Diagnosis Date Noted   Gynecomastia, male 02/19/2020   Gastric varices    Hyperuricemia 10/02/2016   Alcohol abuse 03/30/2015   Dermatitis, eczematoid 03/20/2015   GERD (gastroesophageal reflux disease) 92/05/9416   History of Helicobacter pylori infection 03/20/2015   Hearing loss of right ear 03/20/2015   Hernia, inguinal, left 03/20/2015   Supraspinatus sprain and strain 03/20/2015   Cirrhosis ([place]) 03/20/2015   Scalp laceration 02/28/2015   Rib contusion 02/28/2015   Splenic vein thrombosis    History of portal hypertension    Blood clotting disorder ([place]) 02/11/2014   Odynophagia 02/10/2014   Chronic pancreatitis ([place]) 12/08/2013   Abnormal liver enzymes 12/08/2013   Esophagitis 12/08/2013   Gastric catarrh 12/08/2013   H/O adenomatous polyp of colon 09/23/2012   Esophageal varices ([place]) 09/09/2012   DDD (degenerative disc disease), cervical 08/17/2011   Vitamin D deficiency 08/19/2009   Type 2 diabetes mellitus with hyperglycemia, with long-term current use of insulin ([place]) 10/01/2007   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   Depression 05/13/2007   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   Obesity 07/09/2005   Hypertension 07/09/1998   Social History   Tobacco Use   Smoking status: Never Smoker   Smokeless tobacco: Never Used  Vaping Use   Vaping Use: Never used  Substance Use Topics   Alcohol use: Yes    Alcohol/week: 0.0 standard  drinks    Comment: daily    Drug use: No   Allergies  Allergen Reactions   Hydrochlorothiazide Other (See Comments)    Reaction:  Hyponatremia       Medications: Outpatient Medications Prior to Visit  Medication Sig   ACCU-CHEK FASTCLIX LANCETS MISC USE TO CHECK BLOOD SUGAR 3 TIMES A DAY   allopurinol (ZYLOPRIM) 100 MG tablet Take 1 tablet (100 mg total) by mouth daily.   Blood Glucose Monitoring Suppl (ACCU-CHEK AVIVA PLUS) w/Device KIT Use to check blood sugar three times daily for insulin requiring type 2 diabetes (E11.9)   Calcium Citrate 200 MG TABS Take 200 mg by mouth daily. Reported on 11/21/2015   carvedilol (COREG) 3.125 MG tablet Take 1 tablet (3.125 mg total) by mouth 2 (two) times daily.   cyclobenzaprine (FLEXERIL) 5 MG tablet Take 1 tablet (5 mg total) by mouth 3 (three) times daily as needed for muscle spasms.   furosemide (LASIX) 40 MG tablet Take 1 tablet (40 mg total) by mouth 2 (two) times daily. (Patient taking differently: Take 40 mg by mouth daily. )   glucose blood (ACCU-CHEK AVIVA PLUS) test strip Use as instructed   insulin aspart (NOVOLOG FLEXPEN) 100 UNIT/ML FlexPen INJECT UP TO 20 UNITS THREE TIMES A DAY BEFORE MEALS   Insulin Glargine (BASAGLAR KWIKPEN) 100 UNIT/ML Inject 0.24 mLs (24 Units total) into the skin daily. INJECT UP TO 20 UNITS      SUBCUTANEOUSLY AT BEDTIME (Patient taking differently: Inject 24 Units into the skin daily. INJECT UP TO 24 UNITS      SUBCUTANEOUSLY AT BEDTIME)   Insulin Pen Needle (PEN NEEDLES) 31G X 5 MM MISC 1 Device by Does not apply route 3 (three) times daily.   lisinopril (ZESTRIL) 10 MG tablet TAKE 1 TABLET BY MOUTH EVERY DAY (Patient taking differently: Take 5 mg by mouth daily. )   magnesium gluconate (MAGONATE) 500 MG tablet Take 500 mg by mouth daily.    Multiple Vitamin (MULTIVITAMIN WITH MINERALS) TABS tablet Take 1 tablet by mouth daily.   naltrexone (DEPADE) 50 MG tablet Take 1 tablet (50 mg total) by  mouth daily.   omeprazole (PRILOSEC) 40 MG capsule TAKE 1 CAPSULE BY MOUTH EVERY DAY   Potassium Gluconate 550 MG TABS Take 550 mg by mouth daily.    promethazine (PHENERGAN) 25 MG tablet Take 1 tablet (25 mg total) by mouth every 8 (eight) hours as needed for nausea or vomiting.   pyridOXINE (VITAMIN B-6) 50 MG tablet Take 50 mg by mouth daily.   sertraline (ZOLOFT) 100 MG tablet TAKE 1 TABLET BY MOUTH EVERY DAY   spironolactone (ALDACTONE) 100 MG tablet Take 1 tablet (100 mg total) by mouth daily.   zinc gluconate 50 MG tablet Take 50 mg by mouth daily.   traZODone (DESYREL) 50 MG tablet Take 50 mg by mouth at bedtime. (Patient not taking: Reported on 01/26/2020)   Facility-Administered Medications Prior to Visit  Medication Dose Route Frequency Provider   albumin human 25 % solution 12.5 g  12.5 g Intravenous Once Lucilla Lame, MD  Review of Systems  Constitutional: Positive for appetite change. Negative for chills, fatigue and fever.  Respiratory: Negative for cough, chest tightness, shortness of breath and wheezing.   Cardiovascular: Negative for chest pain and palpitations.  Gastrointestinal: Negative for abdominal pain and vomiting.    Last metabolic panel Lab Results  Component Value Date   GLUCOSE 326 (H) 01/29/2020   NA 130 (L) 01/29/2020   K 5.4 (H) 01/29/2020   CL 92 (L) 01/29/2020   CO2 25 01/29/2020   BUN 24 01/29/2020   CREATININE 0.90 01/29/2020   GFRNONAA 87 01/29/2020   GFRAA 100 01/29/2020   CALCIUM 9.9 01/29/2020   PROT 7.4 01/29/2020   ALBUMIN 4.5 01/29/2020   LABGLOB 2.9 01/29/2020   AGRATIO 1.6 01/29/2020   BILITOT 0.5 01/29/2020   ALKPHOS 257 (H) 01/29/2020   AST 29 01/29/2020   ALT 24 01/29/2020   ANIONGAP 9 11/16/2019   Last lipids Lab Results  Component Value Date   CHOL 115 09/15/2018   HDL 52 09/15/2018   LDLCALC 49 09/15/2018   TRIG 68 09/15/2018   CHOLHDL 2.2 09/15/2018      Objective    BP (!) 89/52 (BP Location: Left  Arm, Patient Position: Sitting, Cuff Size: Normal)    Pulse 80    Temp 99.4 F (37.4 C) (Oral)    Resp 16    Wt 147 lb 9.6 oz (67 kg)    BMI 24.56 kg/m  BP Readings from Last 3 Encounters:  02/19/20 (!) 89/52  01/26/20 103/68  09/16/19 126/71   Wt Readings from Last 3 Encounters:  02/19/20 147 lb 9.6 oz (67 kg)  01/26/20 141 lb 9.6 oz (64.2 kg)  09/16/19 132 lb 12.8 oz (60.2 kg)      Physical Exam Vitals reviewed.  Constitutional:      General: He is not in acute distress.    Appearance: Normal appearance. He is not diaphoretic.  HENT:     Head: Normocephalic and atraumatic.  Eyes:     General: No scleral icterus.    Conjunctiva/sclera: Conjunctivae normal.  Cardiovascular:     Rate and Rhythm: Normal rate and regular rhythm.     Pulses: Normal pulses.     Heart sounds: Normal heart sounds. No murmur heard.   Pulmonary:     Effort: Pulmonary effort is normal. No respiratory distress.     Breath sounds: Normal breath sounds. No wheezing or rhonchi.  Chest:     Breasts: Breasts are symmetrical.        Right: Tenderness present. No bleeding, inverted nipple, mass, nipple discharge or skin change.        Left: Tenderness present. No bleeding, inverted nipple, mass, nipple discharge or skin change.     Comments: Full breast tissue retroaerolar bilateral - symmetric. +gynecomastia Abdominal:     General: There is no distension.     Palpations: Abdomen is soft.     Tenderness: There is no abdominal tenderness.  Musculoskeletal:     Cervical back: Neck supple.     Right lower leg: No edema.     Left lower leg: No edema.  Lymphadenopathy:     Cervical: No cervical adenopathy.     Upper Body:     Right upper body: No axillary adenopathy.     Left upper body: No axillary adenopathy.  Skin:    General: Skin is warm and dry.     Capillary Refill: Capillary refill takes less than 2 seconds.  Findings: No rash.  Neurological:     Mental Status: He is alert and oriented to  person, place, and time.     Cranial Nerves: No cranial nerve deficit.  Psychiatric:        Mood and Affect: Mood normal.        Behavior: Behavior normal.      No results found for any visits on 02/19/20.  Assessment & Plan     Problem List Items Addressed This Visit      Endocrine   Type 2 diabetes mellitus with hyperglycemia, with long-term current use of insulin ([place]) - Primary    Not at goal with last A1c 11.2 Continues to have significant hyperglycemia with fasting blood glucoses in the 300s despite recent basal insulin increase Continue Novolog as prescribed Basaglar increase to 30 units daily, if fasting blood sugar still over 200 in the morning, increase by two units  May consider addition of SSI with standing mealtime insulin as well UTD on vaccines, eye exam On ACEi On Statin Discussed diet and exercise F/u in 2 months         Other   Gynecomastia, male    New problem x2 months Patient denies any new medications or supplements He has been taking spironolactone for about 1 year He does not have any red flag symptoms such as discrete masses, skin changes, nipple discharge and the gynecomastia is symmetric Advised him to discuss with his hepatologist about switching his spironolactone to something else to see if this improves the gynecomastia as that is the most likely etiology for his gynecomastia          Return in about 2 months (around 04/20/2020) for as scheduled.      I, Lavon Paganini, MD, have reviewed all documentation for this visit. The documentation on 02/19/20 for the exam, diagnosis, procedures, and orders are all accurate and complete.   Aimie Wagman, Dionne Bucy, MD, MPH [facility]

## 2020-02-19 NOTE — Assessment & Plan Note (Signed)
New problem x2 months Patient denies any new medications or supplements He has been taking spironolactone for about 1 year He does not have any red flag symptoms such as discrete masses, skin changes, nipple discharge and the gynecomastia is symmetric Advised him to discuss with his hepatologist about switching his spironolactone to something else to see if this improves the gynecomastia as that is the most likely etiology for his gynecomastia

## 2020-02-19 NOTE — Assessment & Plan Note (Addendum)
Not at goal with last A1c 11.2 Continues to have significant hyperglycemia with fasting blood glucoses in the 300s despite recent basal insulin increase Continue Novolog as prescribed Basaglar increase to 30 units daily, if fasting blood sugar still over 200 in the morning, increase by two units  May consider addition of SSI with standing mealtime insulin as well UTD on vaccines, eye exam On ACEi On Statin Discussed diet and exercise F/u in 2 months

## 2020-03-01 ENCOUNTER — Telehealth: Payer: Self-pay | Admitting: Family Medicine

## 2020-03-01 DIAGNOSIS — E875 Hyperkalemia: Secondary | ICD-10-CM

## 2020-03-01 NOTE — Telephone Encounter (Signed)
Advised pt wife as below.  

## 2020-03-01 NOTE — Telephone Encounter (Signed)
Please remind patient it is time to recheck labs due to high potassium levels in July. Future order has been placed. He does not have to fast.

## 2020-03-03 ENCOUNTER — Other Ambulatory Visit: Payer: Self-pay

## 2020-03-03 DIAGNOSIS — E875 Hyperkalemia: Secondary | ICD-10-CM

## 2020-03-04 LAB — RENAL FUNCTION PANEL
Albumin: 4.3 g/dL (ref 3.8–4.8)
BUN/Creatinine Ratio: 28 — ABNORMAL HIGH (ref 10–24)
BUN: 20 mg/dL (ref 8–27)
CO2: 22 mmol/L (ref 20–29)
Calcium: 9 mg/dL (ref 8.6–10.2)
Chloride: 95 mmol/L — ABNORMAL LOW (ref 96–106)
Creatinine, Ser: 0.71 mg/dL — ABNORMAL LOW (ref 0.76–1.27)
GFR calc Af Amer: 111 mL/min/{1.73_m2} (ref >59)
GFR calc non Af Amer: 96 mL/min/{1.73_m2} (ref >59)
Glucose: 316 mg/dL — ABNORMAL HIGH (ref 65–99)
Phosphorus: 3.7 mg/dL (ref 2.8–4.1)
Potassium: 4.4 mmol/L (ref 3.5–5.2)
Sodium: 131 mmol/L — ABNORMAL LOW (ref 134–144)

## 2020-03-15 ENCOUNTER — Ambulatory Visit: Payer: Medicare HMO | Admitting: Family Medicine

## 2020-03-23 ENCOUNTER — Telehealth: Payer: Self-pay | Admitting: Family Medicine

## 2020-03-23 NOTE — Telephone Encounter (Signed)
Copied from [place] [id number]. Topic: Medicare AWV >> Mar 23, 2020  3:17 PM Cher Nakai R wrote: Reason for CRM:  Left message for patient to call back and schedule Medicare Annual Wellness Visit (AWV) either virtually or in office.  No hx of AWV; please schedule at anytime with [facility] Advisor.

## 2020-03-24 ENCOUNTER — Telehealth: Payer: Self-pay | Admitting: Family Medicine

## 2020-03-27 ENCOUNTER — Other Ambulatory Visit: Payer: Self-pay | Admitting: Family Medicine

## 2020-04-03 ENCOUNTER — Other Ambulatory Visit: Payer: Self-pay | Admitting: Gastroenterology

## 2020-04-07 ENCOUNTER — Other Ambulatory Visit: Payer: Self-pay | Admitting: Family Medicine

## 2020-04-07 MED ORDER — LISINOPRIL 10 MG PO TABS
10.0000 mg | ORAL_TABLET | Freq: Every day | ORAL | 4 refills | Status: DC
Start: 2020-04-07 — End: 2021-04-24

## 2020-04-07 NOTE — Addendum Note (Signed)
Addended by: Birdie Sons on: 04/07/2020 08:15 AM   Modules accepted: Orders

## 2020-04-07 NOTE — Telephone Encounter (Signed)
Requested Prescriptions  Pending Prescriptions Disp Refills   lisinopril (ZESTRIL) 10 MG tablet [Pharmacy Med Name: LISINOPRIL 10 MG TABLET] 90 tablet 4    Sig: TAKE 1 TABLET BY MOUTH EVERY DAY     Cardiovascular:  ACE Inhibitors Failed - 04/07/2020  1:34 AM      Failed - Cr in normal range and within 180 days    Creat  Date Value Ref Range Status  06/10/2017 0.81 0.70 - 1.25 mg/dL Final    Comment:    For patients >69 years of age, the reference limit for Creatinine is approximately 13% higher for people identified as African-American. .    Creatinine, Ser  Date Value Ref Range Status  03/03/2020 0.71 (L) 0.76 - 1.27 mg/dL Final         Failed - Last BP in normal range    BP Readings from Last 1 Encounters:  02/19/20 (!) 89/52         Passed - K in normal range and within 180 days    Potassium  Date Value Ref Range Status  03/03/2020 4.4 3.5 - 5.2 mmol/L Final  04/27/2014 4.5 3.5 - 5.1 mmol/L Final         Passed - Patient is not pregnant      Passed - Valid encounter within last 6 months    Recent Outpatient Visits          1 month ago Type 2 diabetes mellitus with hyperglycemia, with long-term current use of insulin ([place])   [facility], Dionne Bucy, MD   2 months ago Type 2 diabetes mellitus with hyperglycemia, with long-term current use of insulin [facility])   [facility] Birdie Sons, MD   8 months ago Abdominal swelling   [facility] Birdie Sons, MD   9 months ago Type 2 diabetes mellitus without complication, with long-term current use of insulin [facility])   [facility] Birdie Sons, MD   10 months ago No-show for appointment   [facility] Birdie Sons, MD      Future Appointments            In 3 weeks Fisher, [name], MD [facility] [place]

## 2020-04-14 ENCOUNTER — Ambulatory Visit: Payer: Medicare HMO

## 2020-04-14 DIAGNOSIS — M5451 Vertebrogenic low back pain: Secondary | ICD-10-CM | POA: Diagnosis not present

## 2020-04-14 DIAGNOSIS — M9902 Segmental and somatic dysfunction of thoracic region: Secondary | ICD-10-CM | POA: Diagnosis not present

## 2020-04-14 DIAGNOSIS — M9901 Segmental and somatic dysfunction of cervical region: Secondary | ICD-10-CM | POA: Diagnosis not present

## 2020-04-14 DIAGNOSIS — M9904 Segmental and somatic dysfunction of sacral region: Secondary | ICD-10-CM | POA: Diagnosis not present

## 2020-04-14 DIAGNOSIS — M546 Pain in thoracic spine: Secondary | ICD-10-CM | POA: Diagnosis not present

## 2020-04-14 DIAGNOSIS — M461 Sacroiliitis, not elsewhere classified: Secondary | ICD-10-CM | POA: Diagnosis not present

## 2020-04-14 DIAGNOSIS — M5413 Radiculopathy, cervicothoracic region: Secondary | ICD-10-CM | POA: Diagnosis not present

## 2020-04-14 DIAGNOSIS — M9903 Segmental and somatic dysfunction of lumbar region: Secondary | ICD-10-CM | POA: Diagnosis not present

## 2020-04-15 DIAGNOSIS — R69 Illness, unspecified: Secondary | ICD-10-CM | POA: Diagnosis not present

## 2020-04-18 DIAGNOSIS — M5413 Radiculopathy, cervicothoracic region: Secondary | ICD-10-CM | POA: Diagnosis not present

## 2020-04-18 DIAGNOSIS — M9903 Segmental and somatic dysfunction of lumbar region: Secondary | ICD-10-CM | POA: Diagnosis not present

## 2020-04-18 DIAGNOSIS — M9904 Segmental and somatic dysfunction of sacral region: Secondary | ICD-10-CM | POA: Diagnosis not present

## 2020-04-18 DIAGNOSIS — M9901 Segmental and somatic dysfunction of cervical region: Secondary | ICD-10-CM | POA: Diagnosis not present

## 2020-04-18 DIAGNOSIS — M5451 Vertebrogenic low back pain: Secondary | ICD-10-CM | POA: Diagnosis not present

## 2020-04-18 DIAGNOSIS — M9902 Segmental and somatic dysfunction of thoracic region: Secondary | ICD-10-CM | POA: Diagnosis not present

## 2020-04-18 DIAGNOSIS — M546 Pain in thoracic spine: Secondary | ICD-10-CM | POA: Diagnosis not present

## 2020-04-18 DIAGNOSIS — M461 Sacroiliitis, not elsewhere classified: Secondary | ICD-10-CM | POA: Diagnosis not present

## 2020-04-19 DIAGNOSIS — M9903 Segmental and somatic dysfunction of lumbar region: Secondary | ICD-10-CM | POA: Diagnosis not present

## 2020-04-19 DIAGNOSIS — M461 Sacroiliitis, not elsewhere classified: Secondary | ICD-10-CM | POA: Diagnosis not present

## 2020-04-19 DIAGNOSIS — M9904 Segmental and somatic dysfunction of sacral region: Secondary | ICD-10-CM | POA: Diagnosis not present

## 2020-04-19 DIAGNOSIS — M9901 Segmental and somatic dysfunction of cervical region: Secondary | ICD-10-CM | POA: Diagnosis not present

## 2020-04-19 DIAGNOSIS — M9902 Segmental and somatic dysfunction of thoracic region: Secondary | ICD-10-CM | POA: Diagnosis not present

## 2020-04-19 DIAGNOSIS — M5413 Radiculopathy, cervicothoracic region: Secondary | ICD-10-CM | POA: Diagnosis not present

## 2020-04-19 DIAGNOSIS — M5451 Vertebrogenic low back pain: Secondary | ICD-10-CM | POA: Diagnosis not present

## 2020-04-19 DIAGNOSIS — M546 Pain in thoracic spine: Secondary | ICD-10-CM | POA: Diagnosis not present

## 2020-04-21 DIAGNOSIS — M461 Sacroiliitis, not elsewhere classified: Secondary | ICD-10-CM | POA: Diagnosis not present

## 2020-04-21 DIAGNOSIS — M5413 Radiculopathy, cervicothoracic region: Secondary | ICD-10-CM | POA: Diagnosis not present

## 2020-04-21 DIAGNOSIS — M546 Pain in thoracic spine: Secondary | ICD-10-CM | POA: Diagnosis not present

## 2020-04-21 DIAGNOSIS — M9904 Segmental and somatic dysfunction of sacral region: Secondary | ICD-10-CM | POA: Diagnosis not present

## 2020-04-21 DIAGNOSIS — M9901 Segmental and somatic dysfunction of cervical region: Secondary | ICD-10-CM | POA: Diagnosis not present

## 2020-04-21 DIAGNOSIS — M5451 Vertebrogenic low back pain: Secondary | ICD-10-CM | POA: Diagnosis not present

## 2020-04-21 DIAGNOSIS — M9902 Segmental and somatic dysfunction of thoracic region: Secondary | ICD-10-CM | POA: Diagnosis not present

## 2020-04-21 DIAGNOSIS — M9903 Segmental and somatic dysfunction of lumbar region: Secondary | ICD-10-CM | POA: Diagnosis not present

## 2020-04-26 DIAGNOSIS — M9904 Segmental and somatic dysfunction of sacral region: Secondary | ICD-10-CM | POA: Diagnosis not present

## 2020-04-26 DIAGNOSIS — M546 Pain in thoracic spine: Secondary | ICD-10-CM | POA: Diagnosis not present

## 2020-04-26 DIAGNOSIS — M9902 Segmental and somatic dysfunction of thoracic region: Secondary | ICD-10-CM | POA: Diagnosis not present

## 2020-04-26 DIAGNOSIS — M5413 Radiculopathy, cervicothoracic region: Secondary | ICD-10-CM | POA: Diagnosis not present

## 2020-04-26 DIAGNOSIS — M5451 Vertebrogenic low back pain: Secondary | ICD-10-CM | POA: Diagnosis not present

## 2020-04-26 DIAGNOSIS — M9901 Segmental and somatic dysfunction of cervical region: Secondary | ICD-10-CM | POA: Diagnosis not present

## 2020-04-26 DIAGNOSIS — M9903 Segmental and somatic dysfunction of lumbar region: Secondary | ICD-10-CM | POA: Diagnosis not present

## 2020-04-26 DIAGNOSIS — M461 Sacroiliitis, not elsewhere classified: Secondary | ICD-10-CM | POA: Diagnosis not present

## 2020-04-28 NOTE — Progress Notes (Signed)
Established patient visit   Patient: [name]   DOB: 11/04/1950   69 y.o. Male  MRN: [id number] Visit Date: 04/29/2020  Today's healthcare provider: Lelon Huh, MD   No chief complaint on file.  Subjective    HPI  Diabetes Mellitus Type II, Follow-up  Lab Results  Component Value Date   HGBA1C 11.2 (A) 01/26/2020   HGBA1C 7.1 (A) 07/27/2019   HGBA1C 8.8 (A) 01/19/2019   Wt Readings from Last 3 Encounters:  02/19/20 147 lb 9.6 oz (67 kg)  01/26/20 141 lb 9.6 oz (64.2 kg)  09/16/19 132 lb 12.8 oz (60.2 kg)   Last seen for diabetes 02/19/2020 (seen by Dr. Brita Romp). Management since then includes increasing Basaglar to 30 units daily, if fasting blood sugar still over 200 in the morning, increase by two units and continueing Novolog SSI. However he states he is usually just taking 24 units and fasting sugars range from 116 to 140s. Not having any hypoglycemia  Most Recent Eye Exam: 09/02/2019  Pertinent Labs: Lab Results  Component Value Date   CHOL 115 09/15/2018   HDL 52 09/15/2018   LDLCALC 49 09/15/2018   TRIG 68 09/15/2018   CHOLHDL 2.2 09/15/2018   Lab Results  Component Value Date   NA 131 (L) 03/03/2020   K 4.4 03/03/2020   CREATININE 0.71 (L) 03/03/2020   GFRNONAA 96 03/03/2020   GFRAA 111 03/03/2020   GLUCOSE 316 (H) 03/03/2020     --------------------------------------------------------------------------------------------------- He states he is doing well with alcohol abstinence and has not consumed any alcohol in over six months. Has stopped taking Natrexone for several months.   He also reports he smashed his left little finger about a month ago, but is still swollen, tender and red.      Medications: Outpatient Medications Prior to Visit  Medication Sig  . ACCU-CHEK FASTCLIX LANCETS MISC USE TO CHECK BLOOD SUGAR 3 TIMES A DAY  . allopurinol (ZYLOPRIM) 100 MG tablet Take 1 tablet (100 mg total) by mouth daily.  . Blood Glucose  Monitoring Suppl (ACCU-CHEK AVIVA PLUS) w/Device KIT Use to check blood sugar three times daily for insulin requiring type 2 diabetes (E11.9)  . Calcium Citrate 200 MG TABS Take 200 mg by mouth daily. Reported on 11/21/2015  . carvedilol (COREG) 3.125 MG tablet Take 1 tablet (3.125 mg total) by mouth 2 (two) times daily.  . cyclobenzaprine (FLEXERIL) 5 MG tablet Take 1 tablet (5 mg total) by mouth 3 (three) times daily as needed for muscle spasms.  . furosemide (LASIX) 40 MG tablet Take 1 tablet (40 mg total) by mouth 2 (two) times daily. (Patient taking differently: Take 40 mg by mouth daily. )  . glucose blood (ACCU-CHEK AVIVA PLUS) test strip Use as instructed  . insulin aspart (NOVOLOG FLEXPEN) 100 UNIT/ML FlexPen INJECT UP TO 20 UNITS THREE TIMES A DAY BEFORE MEALS  . Insulin Glargine (BASAGLAR KWIKPEN) 100 UNIT/ML Inject 0.24 mLs (24 Units total) into the skin daily. INJECT UP TO 20 UNITS      SUBCUTANEOUSLY AT BEDTIME (Patient taking differently: Inject 24 Units into the skin daily. INJECT UP TO 24 UNITS      SUBCUTANEOUSLY AT BEDTIME)  . Insulin Pen Needle (PEN NEEDLES) 31G X 5 MM MISC 1 Device by Does not apply route 3 (three) times daily.  Marland Kitchen lisinopril (ZESTRIL) 10 MG tablet Take 1 tablet (10 mg total) by mouth daily.  . magnesium gluconate (MAGONATE) 500 MG tablet Take 500  mg by mouth daily.   . Multiple Vitamin (MULTIVITAMIN WITH MINERALS) TABS tablet Take 1 tablet by mouth daily.  . naltrexone (DEPADE) 50 MG tablet Take 1 tablet (50 mg total) by mouth daily.  Marland Kitchen omeprazole (PRILOSEC) 40 MG capsule TAKE 1 CAPSULE BY MOUTH EVERY DAY  . Potassium Gluconate 550 MG TABS Take 550 mg by mouth daily.   . promethazine (PHENERGAN) 25 MG tablet Take 1 tablet (25 mg total) by mouth every 8 (eight) hours as needed for nausea or vomiting.  . pyridOXINE (VITAMIN B-6) 50 MG tablet Take 50 mg by mouth daily.  . sertraline (ZOLOFT) 100 MG tablet TAKE 1 TABLET BY MOUTH EVERY DAY  . spironolactone  (ALDACTONE) 100 MG tablet Take 1 tablet (100 mg total) by mouth daily.  . traZODone (DESYREL) 50 MG tablet Take 50 mg by mouth at bedtime. (Patient not taking: Reported on 01/26/2020)  . zinc gluconate 50 MG tablet Take 50 mg by mouth daily.   Facility-Administered Medications Prior to Visit  Medication Dose Route Frequency Provider  . albumin human 25 % solution 12.5 g  12.5 g Intravenous Once Lucilla Lame, MD       Objective    BP 100/69   Pulse 73   Resp 16   Ht 5' 5"  (1.651 m)   Wt 154 lb (69.9 kg)   SpO2 98%   BMI 25.63 kg/m    Physical Exam   General: Appearance:     Overweight male in no acute distress  Eyes:    PERRL, conjunctiva/corneas clear, EOM's intact       Lungs:     Clear to auscultation bilaterally, respirations unlabored  Heart:    Normal heart rate. Normal rhythm. No murmurs, rubs, or gallops.   MS:   All extremities are intact. Distal phalanx of left 5th digit red and swollen around nail.   Neurologic:   Awake, alert, oriented x 3. No apparent focal neurological           defect.         Results for orders placed or performed in visit on 04/29/20  POCT HgB A1C  Result Value Ref Range   Hemoglobin A1C 9.6 (A) 4.0 - 5.6 %    Assessment & Plan     1. Type 2 diabetes mellitus with hyperglycemia, with long-term current use of insulin (H[facility]) A1c slightly better, but not too goal, he is still only taking 24 units of basaglar. Advised we would like to see fastings consistently in the 90-110 range. Will increase- Insulin Glargine (BASAGLAR KWIKPEN) 100 UNIT/ML; to 28 Units into the skin daily. Avoid sweets and starchy foods and exercise average of 30 minutes each day.  Continue SS Novolog  2. Alcohol dependence in remission ([place]) By patient report is abstinent over 6 months and no longer requiring naltrexone.   3. Paronychia of left little finger  - cephALEXin (KEFLEX) 500 MG capsule; Take 1 capsule (500 mg total) by mouth 3 (three) times daily for 7 days.   Dispense: 21 capsule; Refill: 0   Call if symptoms change or if not rapidly improving.     Future Appointments  Date Time Provider [place]  08/30/2020 10:20 AM Birdie Sons, MD [id number]         The entirety of the information documented in the History of Present Illness, Review of Systems and Physical Exam were personally obtained by me. Portions of this information were initially documented by the CMA and reviewed  by me for thoroughness and accuracy.      Lelon Huh, MD  [facility] [phone] (phone) [phone] (fax)  [place]

## 2020-04-29 ENCOUNTER — Other Ambulatory Visit: Payer: Self-pay

## 2020-04-29 ENCOUNTER — Ambulatory Visit (INDEPENDENT_AMBULATORY_CARE_PROVIDER_SITE_OTHER): Payer: Medicare HMO | Admitting: Family Medicine

## 2020-04-29 ENCOUNTER — Encounter: Payer: Self-pay | Admitting: Family Medicine

## 2020-04-29 VITALS — BP 100/69 | HR 73 | Resp 16 | Ht 65.0 in | Wt 154.0 lb

## 2020-04-29 DIAGNOSIS — F1021 Alcohol dependence, in remission: Secondary | ICD-10-CM | POA: Diagnosis not present

## 2020-04-29 DIAGNOSIS — Z794 Long term (current) use of insulin: Secondary | ICD-10-CM | POA: Diagnosis not present

## 2020-04-29 DIAGNOSIS — R69 Illness, unspecified: Secondary | ICD-10-CM | POA: Diagnosis not present

## 2020-04-29 DIAGNOSIS — L03012 Cellulitis of left finger: Secondary | ICD-10-CM

## 2020-04-29 DIAGNOSIS — E1165 Type 2 diabetes mellitus with hyperglycemia: Secondary | ICD-10-CM | POA: Diagnosis not present

## 2020-04-29 LAB — POCT GLYCOSYLATED HEMOGLOBIN (HGB A1C): Hemoglobin A1C: 9.6 % — AB (ref 4.0–5.6)

## 2020-04-29 MED ORDER — CEPHALEXIN 500 MG PO CAPS: 500.0000 mg | ORAL_CAPSULE | Freq: Three times a day (TID) | ORAL | 0 refills | Status: AC

## 2020-04-29 MED ORDER — BASAGLAR KWIKPEN 100 UNIT/ML ~~LOC~~ SOPN: 28.0000 [IU] | PEN_INJECTOR | Freq: Every day | SUBCUTANEOUS | Status: DC

## 2020-04-29 NOTE — Patient Instructions (Addendum)
.   Please review the attached list of medications and notify my office if there are any errors.   . Increase Basaglar to 28 units every day   The CDC recommends two doses of Shingrix (the shingles vaccine) separated by 2 to 6 months for adults age 69 years and older. I recommend checking with your pharmacy plan regarding coverage for this vaccine.

## 2020-05-03 ENCOUNTER — Telehealth: Payer: Self-pay

## 2020-05-03 ENCOUNTER — Telehealth: Payer: Self-pay | Admitting: *Deleted

## 2020-05-03 DIAGNOSIS — M546 Pain in thoracic spine: Secondary | ICD-10-CM | POA: Diagnosis not present

## 2020-05-03 DIAGNOSIS — M5413 Radiculopathy, cervicothoracic region: Secondary | ICD-10-CM | POA: Diagnosis not present

## 2020-05-03 DIAGNOSIS — M9904 Segmental and somatic dysfunction of sacral region: Secondary | ICD-10-CM | POA: Diagnosis not present

## 2020-05-03 DIAGNOSIS — M9901 Segmental and somatic dysfunction of cervical region: Secondary | ICD-10-CM | POA: Diagnosis not present

## 2020-05-03 DIAGNOSIS — M5451 Vertebrogenic low back pain: Secondary | ICD-10-CM | POA: Diagnosis not present

## 2020-05-03 DIAGNOSIS — M461 Sacroiliitis, not elsewhere classified: Secondary | ICD-10-CM | POA: Diagnosis not present

## 2020-05-03 DIAGNOSIS — M9902 Segmental and somatic dysfunction of thoracic region: Secondary | ICD-10-CM | POA: Diagnosis not present

## 2020-05-03 DIAGNOSIS — M9903 Segmental and somatic dysfunction of lumbar region: Secondary | ICD-10-CM | POA: Diagnosis not present

## 2020-05-03 NOTE — Chronic Care Management (AMB) (Signed)
  Chronic Care Management   Outreach Note  05/03/2020 Name: SAMRAT HAYWARD MRN: [id number] DOB: 11-14-1950  ERSKINE STEINFELDT is a 69 y.o. year old male who is a primary care patient of Caryn Section, [name], MD. I reached out to [name] by phone today in response to a referral sent by Mr. Knight [facility] health plan.     An unsuccessful telephone outreach was attempted today. The patient was referred to the case management team for assistance with care management and care coordination.   Follow Up Plan: The care management team will reach out to the patient again over the next 7 days. If patient returns call to provider office, please advise to call [place] at [phone].  [place] Management  Direct Dial: [phone]

## 2020-05-03 NOTE — Telephone Encounter (Signed)
Source Subject Topic  [name] (Patient) [name] (Patient) General - Call Back - No Documentation  Reason for CRM: Pt stated he was returning call because someone from the office called him but he was just walking in to the house so he could not talk. Pt requests call back     Did you call this patient? I only see that someone from [facility] tried to call.

## 2020-05-10 NOTE — Chronic Care Management (AMB) (Signed)
  Chronic Care Management   Outreach Note  05/10/2020 Name: GRAYLEN NOBOA MRN: [id number] DOB: 25-Apr-1951  BALEN WOOLUM is a 69 y.o. year old male who is a primary care patient of Caryn Section, [name], MD. I reached out to [name] by phone today in response to a referral sent by Mr. Shaquille Janes [facility] health plan.     A second unsuccessful telephone outreach was attempted today. The patient was referred to the case management team for assistance with care management and care coordination.   Follow Up Plan: The care management team will reach out to the patient again over the next 7 days. If patient returns call to provider office, please advise to call [place] at [phone].  [place] Management

## 2020-05-16 NOTE — Chronic Care Management (AMB) (Signed)
  Chronic Care Management   Outreach Note  05/16/2020 Name: [name] MRN: [id number] DOB: [birth year]  [name] is a 69 y.o. year old male who is a primary care patient of Caryn Section, [name], MD. I reached out to [name] by phone today in response to a referral sent by Mr. Kym Fenter [facility] health plan.     Third unsuccessful telephone outreach was attempted today. The patient was referred to the case management team for assistance with care management and care coordination. The patient's primary care provider has been notified of our unsuccessful attempts to make or maintain contact with the patient. The care management team is pleased to engage with this patient at any time in the future should he/she be interested in assistance from the care management team.   Follow Up Plan: A HIPAA compliant phone message was left for the patient providing contact information and requesting a return call. The care management team is available to follow up with the patient after provider conversation with the patient regarding recommendation for care management engagement and subsequent re-referral to the care management team. If patient returns call to provider office, please advise to call [facility] at [phone].  [place] Management  Direct Dial: [phone]

## 2020-05-17 DIAGNOSIS — M9903 Segmental and somatic dysfunction of lumbar region: Secondary | ICD-10-CM | POA: Diagnosis not present

## 2020-05-17 DIAGNOSIS — M9904 Segmental and somatic dysfunction of sacral region: Secondary | ICD-10-CM | POA: Diagnosis not present

## 2020-05-17 DIAGNOSIS — M5413 Radiculopathy, cervicothoracic region: Secondary | ICD-10-CM | POA: Diagnosis not present

## 2020-05-17 DIAGNOSIS — M5451 Vertebrogenic low back pain: Secondary | ICD-10-CM | POA: Diagnosis not present

## 2020-05-17 DIAGNOSIS — M546 Pain in thoracic spine: Secondary | ICD-10-CM | POA: Diagnosis not present

## 2020-05-17 DIAGNOSIS — M461 Sacroiliitis, not elsewhere classified: Secondary | ICD-10-CM | POA: Diagnosis not present

## 2020-05-17 DIAGNOSIS — M9901 Segmental and somatic dysfunction of cervical region: Secondary | ICD-10-CM | POA: Diagnosis not present

## 2020-05-17 DIAGNOSIS — M9902 Segmental and somatic dysfunction of thoracic region: Secondary | ICD-10-CM | POA: Diagnosis not present

## 2020-05-17 NOTE — Progress Notes (Signed)
Subjective:   [name] is a 69 y.o. male who presents for an Initial Medicare Annual Wellness Visit.  I connected with [name] today by telephone and verified that I am speaking with the correct person using two identifiers. Location patient: home Location provider: work Persons participating in the virtual visit: patient, provider.   I discussed the limitations, risks, security and privacy concerns of performing an evaluation and management service by telephone and the availability of in person appointments. I also discussed with the patient that there may be a patient responsible charge related to this service. The patient expressed understanding and verbally consented to this telephonic visit.    Interactive audio and video telecommunications were attempted between this provider and patient, however failed, due to patient having technical difficulties OR patient did not have access to video capability.  We continued and completed visit with audio only.   Review of Systems    N/A  Cardiac Risk Factors include: advanced age (>35mn, >>82women);diabetes mellitus;hypertension;male gender     Objective:    Today's Vitals   05/18/20 1435  PainSc: 0-No pain   There is no height or weight on file to calculate BMI.  Advanced Directives 05/18/2020 11/16/2019 08/21/2019 05/29/2018 10/02/2016 11/21/2015 05/03/2015  Does Patient Have a Medical Advance Directive? Yes No Yes No Yes Yes No  Type of AParamedicof [place] will - - - [place] will - -  Does patient want to make changes to medical advance directive? - - - - - - -  Copy of HReservein Chart? No - copy requested - - - - - -  Would patient like information on creating a medical advance directive? - No - Patient declined - - - - -    Current Medications (verified) Outpatient Encounter Medications as of 05/18/2020  Medication Sig  . ACCU-CHEK  FASTCLIX LANCETS MISC USE TO CHECK BLOOD SUGAR 3 TIMES A DAY  . allopurinol (ZYLOPRIM) 100 MG tablet Take 1 tablet (100 mg total) by mouth daily.  . 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 Calcium Citrate 200 MG TABS Take 200 mg by mouth daily. Reported on 11/21/2015  . carvedilol (COREG) 3.125 MG tablet Take 1 tablet (3.125 mg total) by mouth 2 (two) times daily.  . Cholecalciferol (D3-1000 PO) Take 1,000 mg by mouth daily.  . cyclobenzaprine (FLEXERIL) 5 MG tablet Take 1 tablet (5 mg total) by mouth 3 (three) times daily as needed for muscle spasms.  . furosemide (LASIX) 40 MG tablet Take 1 tablet (40 mg total) by mouth 2 (two) times daily. (Patient taking differently: Take 40 mg by mouth daily. )  . glucose blood (ACCU-CHEK AVIVA PLUS) test strip Use as instructed  . insulin aspart (NOVOLOG FLEXPEN) 100 UNIT/ML FlexPen INJECT UP TO 20 UNITS THREE TIMES A DAY BEFORE MEALS  . Insulin Glargine (BASAGLAR KWIKPEN) 100 UNIT/ML Inject 28 Units into the skin daily.  . Insulin Pen Needle (PEN NEEDLES) 31G X 5 MM MISC 1 Device by Does not apply route 3 (three) times daily.  .Marland Kitchenlisinopril (ZESTRIL) 10 MG tablet Take 1 tablet (10 mg total) by mouth daily. (Patient taking differently: Take 5 mg by mouth daily. )  . magnesium gluconate (MAGONATE) 500 MG tablet Take 500 mg by mouth daily.   . milk thistle 175 MG tablet Take  175 mg by mouth daily.  . Multiple Vitamin (MULTIVITAMIN WITH MINERALS) TABS tablet Take 1 tablet by mouth daily.  . niacin 500 MG tablet Take 500 mg by mouth at bedtime.  Marland Kitchen omeprazole (PRILOSEC) 40 MG capsule TAKE 1 CAPSULE BY MOUTH EVERY DAY  . promethazine (PHENERGAN) 25 MG tablet Take 1 tablet (25 mg total) by mouth every 8 (eight) hours as needed for nausea or vomiting.  . pyridOXINE (VITAMIN B-6) 50 MG tablet Take 50 mg by mouth daily.  . sertraline (ZOLOFT) 100 MG  tablet TAKE 1 TABLET BY MOUTH EVERY DAY  . spironolactone (ALDACTONE) 100 MG tablet Take 1 tablet (100 mg total) by mouth daily.  Marland Kitchen zinc gluconate 50 MG tablet Take 50 mg by mouth daily.  . Potassium Gluconate 550 MG TABS Take 550 mg by mouth daily.  (Patient not taking: Reported on 05/18/2020)  . traZODone (DESYREL) 50 MG tablet Take 50 mg by mouth at bedtime.  (Patient not taking: Reported on 05/18/2020)   No facility-administered encounter medications on file as of 05/18/2020.    Allergies (verified) Hydrochlorothiazide   History: Past Medical History:  Diagnosis Date  . Alcoholism ([place])   . 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 Diabetes mellitus without complication ([place])   . Dyspnea   . Elevated liver enzymes   . Esophageal varices ([place])   . Gastritis   . GERD (gastroesophageal reflux disease)   . Hyperlipidemia   . Hypertension   . Pancreatitis    Past Surgical History:  Procedure Laterality Date  . CHOLECYSTECTOMY  2006  . COLONOSCOPY WITH ESOPHAGOGASTRODUODENOSCOPY (EGD)    . COLONOSCOPY WITH PROPOFOL N/A 11/21/2015   Procedure: COLONOSCOPY WITH PROPOFOL;  Surgeon: Manya Silvas, MD;  Location: [facility] ENDOSCOPY;  Service: Endoscopy;  Laterality: N/A;  . ESOPHAGOGASTRODUODENOSCOPY (EGD) WITH PROPOFOL N/A 08/21/2019   Procedure: ESOPHAGOGASTRODUODENOSCOPY (EGD) WITH PROPOFOL;  Surgeon: Lucilla Lame, MD;  Location: [facility] ENDOSCOPY;  Service: Endoscopy;  Laterality: N/A;  . HERNIA REPAIR     Inguinal Hernia Repai. Left: 07/28/2013 Dr. [name]; 2nd repair done 04/30/2014  . TONSILLECTOMY     Family History  Problem Relation Age of Onset  . Colon polyps Father   . Lung cancer Father   . Hypertension Father   . Dementia Mother   . Hypertension Brother   . Heart Problems Brother   . Hypertension Brother   . Diabetes Brother        type 2  . Congestive Heart Failure Brother    Social History   Socioeconomic History  . Marital status: Married    Spouse name: Not on file   . Number of children: 3  . Years of education: Not on file  . Highest education level: Some college, no degree  Occupational History  . Occupation: retired  Tobacco Use  . Smoking status: Never Smoker  . Smokeless tobacco: Never Used  Vaping Use  . Vaping Use: Never used  Substance and Sexual Activity  . Alcohol use: Not Currently    Alcohol/week: 0.0 standard drinks    Comment: previous heavy drinker  . Drug use: No  . Sexual activity: Yes  Other Topics Concern  . Not on file  Social History Narrative  . Not on file   Social Determinants of Health   Financial Resource Strain: Low Risk   . Difficulty of Paying Living Expenses: Not hard at all  Food Insecurity: No Food Insecurity  . Worried About Charity fundraiser in the Last  Year: Never true  . Ran Out of Food in the Last Year: Never true  Transportation Needs: No Transportation Needs  . Lack of Transportation (Medical): No  . Lack of Transportation (Non-Medical): No  Physical Activity: Inactive  . Days of Exercise per Week: 0 days  . Minutes of Exercise per Session: 0 min  Stress: Stress Concern Present  . Feeling of Stress : To some extent  Social Connections: Moderately Isolated  . Frequency of Communication with Friends and Family: More than three times a week  . Frequency of Social Gatherings with Friends and Family: More than three times a week  . Attends Religious Services: Never  . Active Member of Clubs or Organizations: No  . Attends Archivist Meetings: Never  . Marital Status: Married    Tobacco Counseling Counseling given: Not Answered   Clinical Intake:  Pre-visit preparation completed: Yes  Pain : No/denies pain Pain Score: 0-No pain     Nutritional Risks: None Diabetes: Yes  How often do you need to have someone help you when you read instructions, pamphlets, or other written materials from your doctor or pharmacy?: 1 - Never  Diabetic? Yes  Nutrition Risk  Assessment:  Has the patient had any N/V/D within the last 2 months?  No  Does the patient have any non-healing wounds?  No  Has the patient had any unintentional weight loss or weight gain?  No   Diabetes:  Is the patient diabetic?  Yes  If diabetic, was a CBG obtained today?  No  Did the patient bring in their glucometer from home?  No  How often do you monitor your CBG's? Three to four times a day.   Financial Strains and Diabetes Management:  Are you having any financial strains with the device, your supplies or your medication? No .  Does the patient want to be seen by Chronic Care Management for management of their diabetes?  No  Would the patient like to be referred to a Nutritionist or for Diabetic Management?  No   Diabetic Exams:  Diabetic Eye Exam: Completed 09/02/19. Diabetic Foot Exam: Overdue, Pt has been advised about the importance in completing this exam. Note made to follow up on this at next in office apt.    Interpreter Needed?: No  Information entered by :: [facility], LPN   Activities of Daily Living In your present state of health, do you have any difficulty performing the following activities: 05/18/2020 04/29/2020  Hearing? N N  Vision? N N  Difficulty concentrating or making decisions? N N  Walking or climbing stairs? N N  Dressing or bathing? N N  Doing errands, shopping? N N  Preparing Food and eating ? N -  Using the Toilet? N -  In the past six months, have you accidently leaked urine? N -  Do you have problems with loss of bowel control? N -  Managing your Medications? N -  Managing your Finances? N -  Housekeeping or managing your Housekeeping? N -  Some recent data might be hidden    Patient Care Team: Birdie Sons, MD as PCP - General (Family Medicine) Lucilla Lame, MD as Consulting Physician (Gastroenterology) Grant Fontana, [place] as Referring Physician (Chiropractic Medicine) Pcp, No  Indicate any recent Medical Services you may  have received from other than [facility] providers in the past year (date may be approximate).     Assessment:   This is a routine wellness examination for [name].  Hearing/Vision screen  No exam data present  Dietary issues and exercise activities discussed: Current Exercise Habits: The patient does not participate in regular exercise at present, Exercise limited by: orthopedic condition(s)  Goals    . DIET - INCREASE WATER INTAKE     Recommend to drink at least 6-8 8oz glasses of water per day.      Depression Screen PHQ 2/9 Scores 04/29/2020 09/15/2018 10/03/2017 10/02/2016  PHQ - 2 Score 0 1 0 0  PHQ- 9 Score 1 - - 3    Fall Risk Fall Risk  05/18/2020 04/29/2020 01/26/2020 09/15/2018 10/03/2017  Falls in the past year? 0 0 1 0 No  Number falls in past yr: 0 0 0 - -  Injury with Fall? 0 0 0 - -  Risk for fall due to : - - No Fall Risks - -  Follow up - - Falls evaluation completed - -    Any stairs in or around the home? Yes  If so, are there any without handrails? No  Home free of loose throw rugs in walkways, pet beds, electrical cords, etc? Yes  Adequate lighting in your home to reduce risk of falls? Yes   ASSISTIVE DEVICES UTILIZED TO PREVENT FALLS:  Life alert? No  Use of a cane, walker or w/c? No  Grab bars in the bathroom? Yes  Shower chair or bench in shower? Yes  Elevated toilet seat or a handicapped toilet? No    Cognitive Function:     6CIT Screen 05/18/2020  What Year? 0 points  What month? 0 points  What time? 0 points  Count back from 20 0 points  Months in reverse 0 points  Repeat phrase 0 points  Total Score 0    Immunizations Immunization History  Administered Date(s) Administered  . Influenza Split 05/12/2007, 05/24/2009, 06/05/2011, 05/12/2012  . Influenza, High Dose Seasonal PF 04/03/2017, 04/14/2018  . Influenza,inj,Quad PF,6+ Mos 05/26/2013, 05/20/2014, 04/11/2015  . Influenza-Unspecified 03/09/2017, 05/01/2019, 04/22/2020  . Moderna  SARS-COVID-2 Vaccination 10/26/2019, 11/23/2019  . Pneumococcal Conjugate-13 10/02/2016, 04/14/2018  . Pneumococcal Polysaccharide-23 08/06/2011, 10/03/2017  . Td 07/09/2006  . Tdap 06/05/2011  . Zoster 01/04/2011    TDAP status: Up to date Flu Vaccine status: Up to date Pneumococcal vaccine status: Up to date Covid-19 vaccine status: Completed vaccines  Qualifies for Shingles Vaccine? Yes   Zostavax completed Yes   Shingrix Completed?: No.    Education has been provided regarding the importance of this vaccine. Patient has been advised to call insurance company to determine out of pocket expense if they have not yet received this vaccine. Advised may also receive vaccine at local pharmacy or Health Dept. Verbalized acceptance and understanding.  Screening Tests Health Maintenance  Topic Date Due  . FOOT EXAM  Never done  . OPHTHALMOLOGY EXAM  09/01/2020  . HEMOGLOBIN A1C  10/28/2020  . COLONOSCOPY  11/20/2020  . TETANUS/TDAP  06/04/2021  . INFLUENZA VACCINE  Completed  . COVID-19 Vaccine  Completed  . Hepatitis C Screening  Completed  . PNA vac Low Risk Adult  Completed    Health Maintenance  Health Maintenance Due  Topic Date Due  . FOOT EXAM  Never done    Colorectal cancer screening: Completed 11/21/15. Repeat every 5 years  Lung Cancer Screening: (Low Dose CT Chest recommended if Age 27-80 years, 30 pack-year currently smoking OR have quit w/in 15years.) does not qualify.   Additional Screening:  Hepatitis C Screening: Up to date  Vision Screening: Recommended annual ophthalmology  exams for early detection of glaucoma and other disorders of the eye. Is the patient up to date with their annual eye exam?  Yes  Who is the provider or what is the name of the office in which the patient attends annual eye exams? [facility] If pt is not established with a provider, would they like to be referred to a provider to establish care? No .   Dental Screening: Recommended  annual dental exams for proper oral hygiene  Community Resource Referral / Chronic Care Management: CRR required this visit?  No   CCM required this visit?  No      Plan:     I have personally reviewed and noted the following in the patient's chart:   . Medical and social history . Use of alcohol, tobacco or illicit drugs  . Current medications and supplements . Functional ability and status . Nutritional status . Physical activity . Advanced directives . List of other physicians . Hospitalizations, surgeries, and ER visits in previous 12 months . Vitals . Screenings to include cognitive, depression, and falls . Referrals and appointments  In addition, I have reviewed and discussed with patient certain preventive protocols, quality metrics, and best practice recommendations. A written personalized care plan for preventive services as well as general preventive health recommendations were provided to patient.     Reshma Hoey [place], [place]   [id number]/04/[id number]   Nurse Notes: Pt needs a diabetic foot exam at next in office apt.

## 2020-05-18 ENCOUNTER — Other Ambulatory Visit: Payer: Self-pay

## 2020-05-18 ENCOUNTER — Ambulatory Visit (INDEPENDENT_AMBULATORY_CARE_PROVIDER_SITE_OTHER): Payer: Medicare HMO

## 2020-05-18 ENCOUNTER — Telehealth: Payer: Self-pay | Admitting: *Deleted

## 2020-05-18 DIAGNOSIS — Z0000 Encounter for general adult medical examination without abnormal findings: Secondary | ICD-10-CM

## 2020-05-18 NOTE — Chronic Care Management (AMB) (Signed)
  Chronic Care Management   Note  05/18/2020 Name: [name] MRN: [id number] DOB: March 10, 1951  [name] is a 69 y.o. year old male who is a primary care patient of Caryn Section, [name], MD. I reached out to [name] by phone today in response to a referral sent by [name] [facility] health plan.     [name] was given information about Chronic Care Management services today including:  1. CCM service includes personalized support from designated clinical staff supervised by his physician, including individualized plan of care and coordination with other care providers 2. 24/7 contact phone numbers for assistance for urgent and routine care needs. 3. Service will only be billed when office clinical staff spend 20 minutes or more in a month to coordinate care. 4. Only one practitioner may furnish and bill the service in a calendar month. 5. The patient may stop CCM services at any time (effective at the end of the month) by phone call to the office staff. 6. The patient will be responsible for cost sharing (co-pay) of up to 20% of the service fee (after annual deductible is met).  Patient agreed to services and verbal consent obtained.   Follow up plan: Telephone appointment with care management team member scheduled for: 06/28/2020  [place] Management  Direct Dial: [phone]

## 2020-05-18 NOTE — Patient Instructions (Signed)
[name] , Thank you for taking time to come for your Medicare Wellness Visit. I appreciate your ongoing commitment to your health goals. Please review the following plan we discussed and let me know if I can assist you in the future.   Screening recommendations/referrals: Colonoscopy: Up to date, due 11/2020 Recommended yearly ophthalmology/optometry visit for glaucoma screening and checkup Recommended yearly dental visit for hygiene and checkup  Vaccinations: Influenza vaccine: Done 04/22/20 Pneumococcal vaccine: Completed series Tdap vaccine: Up to date, due 05/2021 Shingles vaccine: Shingrix discussed. Please contact your pharmacy for coverage information.     Advanced directives: Please bring a copy of your POA (Power of Attorney) and/or Living Will to your next appointment.   Conditions/risks identified: Recommend to drink at least 6-8 8oz glasses of water per day.  Next appointment: 08/30/20 @ 10:20 AM with Dr Caryn Section   Preventive Care 34 Years and Older, Male Preventive care refers to lifestyle choices and visits with your health care provider that can promote health and wellness. What does preventive care include?  A yearly physical exam. This is also called an annual well check.  Dental exams once or twice a year.  Routine eye exams. Ask your health care provider how often you should have your eyes checked.  Personal lifestyle choices, including:  Daily care of your teeth and gums.  Regular physical activity.  Eating a healthy diet.  Avoiding tobacco and drug use.  Limiting alcohol use.  Practicing safe sex.  Taking low doses of aspirin every day.  Taking vitamin and mineral supplements as recommended by your health care provider. What happens during an annual well check? The services and screenings done by your health care provider during your annual well check will depend on your age, overall health, lifestyle risk factors, and family history of  disease. Counseling  Your health care provider may ask you questions about your:  Alcohol use.  Tobacco use.  Drug use.  Emotional well-being.  Home and relationship well-being.  Sexual activity.  Eating habits.  History of falls.  Memory and ability to understand (cognition).  Work and work Statistician. Screening  You may have the following tests or measurements:  Height, weight, and BMI.  Blood pressure.  Lipid and cholesterol levels. These may be checked every 5 years, or more frequently if you are over 42 years old.  Skin check.  Lung cancer screening. You may have this screening every year starting at age 59 if you have a 30-pack-year history of smoking and currently smoke or have quit within the past 15 years.  Fecal occult blood test (FOBT) of the stool. You may have this test every year starting at age 73.  Flexible sigmoidoscopy or colonoscopy. You may have a sigmoidoscopy every 5 years or a colonoscopy every 10 years starting at age 13.  Prostate cancer screening. Recommendations will vary depending on your family history and other risks.  Hepatitis C blood test.  Hepatitis B blood test.  Sexually transmitted disease (STD) testing.  Diabetes screening. This is done by checking your blood sugar (glucose) after you have not eaten for a while (fasting). You may have this done every 1-3 years.  Abdominal aortic aneurysm (AAA) screening. You may need this if you are a current or former smoker.  Osteoporosis. You may be screened starting at age 69 if you are at high risk. Talk with your health care provider about your test results, treatment options, and if necessary, the need for more tests. Vaccines  Your health care provider may recommend certain vaccines, such as:  Influenza vaccine. This is recommended every year.  Tetanus, diphtheria, and acellular pertussis (Tdap, Td) vaccine. You may need a Td booster every 10 years.  Zoster vaccine. You may  need this after age 64.  Pneumococcal 13-valent conjugate (PCV13) vaccine. One dose is recommended after age 73.  Pneumococcal polysaccharide (PPSV23) vaccine. One dose is recommended after age 55. Talk to your health care provider about which screenings and vaccines you need and how often you need them. This information is not intended to replace advice given to you by your health care provider. Make sure you discuss any questions you have with your health care provider. Document Released: 07/22/2015 Document Revised: 03/14/2016 Document Reviewed: 04/26/2015 Elsevier Interactive Patient Education  2017 [employer] Prevention in the Home Falls can cause injuries. They can happen to people of all ages. There are many things you can do to make your home safe and to help prevent falls. What can I do on the outside of my home?  Regularly fix the edges of walkways and driveways and fix any cracks.  Remove anything that might make you trip as you walk through a door, such as a raised step or threshold.  Trim any bushes or trees on the path to your home.  Use bright outdoor lighting.  Clear any walking paths of anything that might make someone trip, such as rocks or tools.  Regularly check to see if handrails are loose or broken. Make sure that both sides of any steps have handrails.  Any raised decks and porches should have guardrails on the edges.  Have any leaves, snow, or ice cleared regularly.  Use sand or salt on walking paths during winter.  Clean up any spills in your garage right away. This includes oil or grease spills. What can I do in the bathroom?  Use night lights.  Install grab bars by the toilet and in the tub and shower. Do not use towel bars as grab bars.  Use non-skid mats or decals in the tub or shower.  If you need to sit down in the shower, use a plastic, non-slip stool.  Keep the floor dry. Clean up any water that spills on the floor as soon as it  happens.  Remove soap buildup in the tub or shower regularly.  Attach bath mats securely with double-sided non-slip rug tape.  Do not have throw rugs and other things on the floor that can make you trip. What can I do in the bedroom?  Use night lights.  Make sure that you have a light by your bed that is easy to reach.  Do not use any sheets or blankets that are too big for your bed. They should not hang down onto the floor.  Have a firm chair that has side arms. You can use this for support while you get dressed.  Do not have throw rugs and other things on the floor that can make you trip. What can I do in the kitchen?  Clean up any spills right away.  Avoid walking on wet floors.  Keep items that you use a lot in easy-to-reach places.  If you need to reach something above you, use a strong step stool that has a grab bar.  Keep electrical cords out of the way.  Do not use floor polish or wax that makes floors slippery. If you must use wax, use non-skid floor wax.  Do  not have throw rugs and other things on the floor that can make you trip. What can I do with my stairs?  Do not leave any items on the stairs.  Make sure that there are handrails on both sides of the stairs and use them. Fix handrails that are broken or loose. Make sure that handrails are as long as the stairways.  Check any carpeting to make sure that it is firmly attached to the stairs. Fix any carpet that is loose or worn.  Avoid having throw rugs at the top or bottom of the stairs. If you do have throw rugs, attach them to the floor with carpet tape.  Make sure that you have a light switch at the top of the stairs and the bottom of the stairs. If you do not have them, ask someone to add them for you. What else can I do to help prevent falls?  Wear shoes that:  Do not have high heels.  Have rubber bottoms.  Are comfortable and fit you well.  Are closed at the toe. Do not wear sandals.  If you  use a stepladder:  Make sure that it is fully opened. Do not climb a closed stepladder.  Make sure that both sides of the stepladder are locked into place.  Ask someone to hold it for you, if possible.  Clearly mark and make sure that you can see:  Any grab bars or handrails.  First and last steps.  Where the edge of each step is.  Use tools that help you move around (mobility aids) if they are needed. These include:  Canes.  Walkers.  Scooters.  Crutches.  Turn on the lights when you go into a dark area. Replace any light bulbs as soon as they burn out.  Set up your furniture so you have a clear path. Avoid moving your furniture around.  If any of your floors are uneven, fix them.  If there are any pets around you, be aware of where they are.  Review your medicines with your doctor. Some medicines can make you feel dizzy. This can increase your chance of falling. Ask your doctor what other things that you can do to help prevent falls. This information is not intended to replace advice given to you by your health care provider. Make sure you discuss any questions you have with your health care provider. Document Released: 04/21/2009 Document Revised: 12/01/2015 Document Reviewed: 07/30/2014 Elsevier Interactive Patient Education  2017 [employer].

## 2020-05-31 ENCOUNTER — Other Ambulatory Visit: Payer: Self-pay | Admitting: Family Medicine

## 2020-05-31 ENCOUNTER — Other Ambulatory Visit: Payer: Self-pay | Admitting: Gastroenterology

## 2020-06-08 ENCOUNTER — Telehealth: Payer: Self-pay | Admitting: Family Medicine

## 2020-06-08 NOTE — Telephone Encounter (Signed)
We received a fax from [facility] that [name] was backordered. Please see if patient has enough to get by for awhile. If not we may need to change to something else.

## 2020-06-08 NOTE — Telephone Encounter (Signed)
Copied from [place] [id number]. Topic: General - Other >> Jun 08, 2020  3:51 PM Rainey Pines A wrote: Patients wife called to inform [name] that patient recivd 5 insulin pens before medication was on backorder

## 2020-06-08 NOTE — Telephone Encounter (Signed)
I called and spoke with patient's wife Freida Busman advising her of the message below. She reports that he only has one pen left of [place].

## 2020-06-14 DIAGNOSIS — M5451 Vertebrogenic low back pain: Secondary | ICD-10-CM | POA: Diagnosis not present

## 2020-06-14 DIAGNOSIS — M9902 Segmental and somatic dysfunction of thoracic region: Secondary | ICD-10-CM | POA: Diagnosis not present

## 2020-06-14 DIAGNOSIS — M9904 Segmental and somatic dysfunction of sacral region: Secondary | ICD-10-CM | POA: Diagnosis not present

## 2020-06-14 DIAGNOSIS — M461 Sacroiliitis, not elsewhere classified: Secondary | ICD-10-CM | POA: Diagnosis not present

## 2020-06-14 DIAGNOSIS — M9903 Segmental and somatic dysfunction of lumbar region: Secondary | ICD-10-CM | POA: Diagnosis not present

## 2020-06-14 DIAGNOSIS — M5413 Radiculopathy, cervicothoracic region: Secondary | ICD-10-CM | POA: Diagnosis not present

## 2020-06-14 DIAGNOSIS — M9901 Segmental and somatic dysfunction of cervical region: Secondary | ICD-10-CM | POA: Diagnosis not present

## 2020-06-14 DIAGNOSIS — M546 Pain in thoracic spine: Secondary | ICD-10-CM | POA: Diagnosis not present

## 2020-06-16 ENCOUNTER — Other Ambulatory Visit: Payer: Self-pay | Admitting: Family Medicine

## 2020-06-16 DIAGNOSIS — R69 Illness, unspecified: Secondary | ICD-10-CM | POA: Diagnosis not present

## 2020-06-20 DIAGNOSIS — M109 Gout, unspecified: Secondary | ICD-10-CM | POA: Diagnosis not present

## 2020-06-20 DIAGNOSIS — N529 Male erectile dysfunction, unspecified: Secondary | ICD-10-CM | POA: Diagnosis not present

## 2020-06-20 DIAGNOSIS — R69 Illness, unspecified: Secondary | ICD-10-CM | POA: Diagnosis not present

## 2020-06-20 DIAGNOSIS — Z794 Long term (current) use of insulin: Secondary | ICD-10-CM | POA: Diagnosis not present

## 2020-06-20 DIAGNOSIS — K7460 Unspecified cirrhosis of liver: Secondary | ICD-10-CM | POA: Diagnosis not present

## 2020-06-20 DIAGNOSIS — K219 Gastro-esophageal reflux disease without esophagitis: Secondary | ICD-10-CM | POA: Diagnosis not present

## 2020-06-20 DIAGNOSIS — I10 Essential (primary) hypertension: Secondary | ICD-10-CM | POA: Diagnosis not present

## 2020-06-20 DIAGNOSIS — E119 Type 2 diabetes mellitus without complications: Secondary | ICD-10-CM | POA: Diagnosis not present

## 2020-06-28 ENCOUNTER — Telehealth: Payer: Self-pay

## 2020-06-28 ENCOUNTER — Telehealth: Payer: Medicare HMO

## 2020-06-28 NOTE — Telephone Encounter (Signed)
Error. Please disregard

## 2020-06-28 NOTE — Telephone Encounter (Signed)
  Chronic Care Management   Outreach Note  06/28/2020 Name: MERRICK MAGGIO MRN: [id number] DOB: 1950/08/25  Primary Care Provider: Birdie Sons, MD Reason for referral : Chronic Care Manager   An unsuccessful telephone outreach was attempted today. Mr. Eckerman was referred to the case management team for assistance with care management and care coordination.     PLAN A HIPAA compliant voice message was left today requesting a return call.    Cristy Friedlander [facility] Care Management [facility] [phone]

## 2020-07-12 ENCOUNTER — Telehealth: Payer: Self-pay

## 2020-07-12 NOTE — Telephone Encounter (Signed)
  Chronic Care Management   Outreach Note  07/12/2020 Name: [name] MRN: [id number] DOB: [birth year]  Primary Care Provider: Malva Limes, MD Reason for referral : Chronic Care Management   An unsuccessful telephone outreach was attempted today. [name] was referred to the case management team for assistance with care management and care coordination.      Follow Up Plan:  Pending confirmation date for initial assessment. A member of the care management team will attempt to reach [name] again within the next two weeks.    [name] [facility] Care Management [facility] [phone]

## 2020-08-09 ENCOUNTER — Telehealth: Payer: Self-pay | Admitting: Family Medicine

## 2020-08-09 DIAGNOSIS — E119 Type 2 diabetes mellitus without complications: Secondary | ICD-10-CM

## 2020-08-09 NOTE — Telephone Encounter (Signed)
Patient called to request a refill on his Tresiba flex pin.  This was not listed on his medication list and patient would like to speak with the nurse to get it filled.  Please advise and call patient to discuss at [phone]

## 2020-08-10 MED ORDER — TRESIBA FLEXTOUCH 100 UNIT/ML ~~LOC~~ SOPN: PEN_INJECTOR | SUBCUTANEOUS | 3 refills | Status: DC

## 2020-08-10 NOTE — Telephone Encounter (Signed)
Does he mean Bas[place]ar? We changed from [place] to [place] last year because [place] Aas was not covered by his insurance.

## 2020-08-10 NOTE — Telephone Encounter (Signed)
Dr. Caryn Section, patient reports that he has been on Triseba for a long time. I'm not sure why it is not on his medication list. Please review. Patient reports that he is out of medication. Thanks!

## 2020-08-10 NOTE — Telephone Encounter (Signed)
Patient reports that he is now on a HMO program and the Tyler Aas is no cost. He forgot about changing to Tax inspector. He would like you to send it in for 90 days if you can.

## 2020-08-16 ENCOUNTER — Ambulatory Visit: Payer: Self-pay

## 2020-08-16 NOTE — Chronic Care Management (AMB) (Signed)
  Chronic Care Management   Outreach Note  08/16/2020 Name: [name] MRN: [id number] DOB: 01/17/1951  Primary Care Provider: Birdie Sons, MD Reason for referral : Chronic Care Management   [name] was referred to the care management team for assistance with chronic care management and care coordination. His primary care provider will be notified of our unsuccessful attempts to establish contact. The care management team will gladly outreach at any time in the future if he is interested in receiving assistance.  Follow Up Plan:  The care management team will gladly follow up with [name] after the primary care provider has a conversation with him regarding recommendation for care management engagement and subsequent re-referral for care management services.   Cristy Friedlander [facility] Care Management [facility] [phone]

## 2020-08-30 ENCOUNTER — Ambulatory Visit: Payer: Medicare HMO | Admitting: Family Medicine

## 2020-09-06 ENCOUNTER — Telehealth: Payer: Self-pay | Admitting: Family Medicine

## 2020-09-06 DIAGNOSIS — E1165 Type 2 diabetes mellitus with hyperglycemia: Secondary | ICD-10-CM

## 2020-09-06 DIAGNOSIS — Z794 Long term (current) use of insulin: Secondary | ICD-10-CM

## 2020-09-06 MED ORDER — NOVOLOG FLEXPEN 100 UNIT/ML ~~LOC~~ SOPN: PEN_INJECTOR | SUBCUTANEOUS | 0 refills | Status: DC

## 2020-09-06 NOTE — Telephone Encounter (Signed)
Pt called in to reschedule his apt with PCP. Pt says some how he mistakenly cancelled it. Pt says that he changed his insurance to a HMO and is now needing a Rx for his insulin needles insulin aspart (NOVOLOG FLEXPEN) 100 UNIT/ML FlexPen  And Flex touch Placebo.    Pharmacy: [id number] - [place], [place]  [address] [place] [address]

## 2020-09-14 ENCOUNTER — Ambulatory Visit: Payer: 59 | Admitting: Family Medicine

## 2020-09-14 ENCOUNTER — Other Ambulatory Visit: Payer: Self-pay

## 2020-09-14 ENCOUNTER — Ambulatory Visit (INDEPENDENT_AMBULATORY_CARE_PROVIDER_SITE_OTHER): Payer: HMO | Admitting: Family Medicine

## 2020-09-14 VITALS — BP 125/87 | HR 69 | Temp 97.5°F | Ht 65.0 in | Wt 163.8 lb

## 2020-09-14 DIAGNOSIS — E119 Type 2 diabetes mellitus without complications: Secondary | ICD-10-CM | POA: Diagnosis not present

## 2020-09-14 DIAGNOSIS — Z23 Encounter for immunization: Secondary | ICD-10-CM

## 2020-09-14 DIAGNOSIS — I10 Essential (primary) hypertension: Secondary | ICD-10-CM | POA: Diagnosis not present

## 2020-09-14 DIAGNOSIS — E559 Vitamin D deficiency, unspecified: Secondary | ICD-10-CM | POA: Diagnosis not present

## 2020-09-14 DIAGNOSIS — Z289 Immunization not carried out for unspecified reason: Secondary | ICD-10-CM | POA: Diagnosis not present

## 2020-09-14 DIAGNOSIS — Z794 Long term (current) use of insulin: Secondary | ICD-10-CM

## 2020-09-14 DIAGNOSIS — E1165 Type 2 diabetes mellitus with hyperglycemia: Secondary | ICD-10-CM

## 2020-09-14 DIAGNOSIS — M545 Low back pain, unspecified: Secondary | ICD-10-CM | POA: Diagnosis not present

## 2020-09-14 DIAGNOSIS — G8929 Other chronic pain: Secondary | ICD-10-CM

## 2020-09-14 DIAGNOSIS — Z125 Encounter for screening for malignant neoplasm of prostate: Secondary | ICD-10-CM

## 2020-09-14 LAB — POCT GLYCOSYLATED HEMOGLOBIN (HGB A1C)
Estimated Average Glucose: 183
Hemoglobin A1C: 8 % — AB (ref 4.0–5.6)

## 2020-09-14 MED ORDER — PEN NEEDLES 31G X 5 MM MISC
1.0000 | Freq: Three times a day (TID) | 4 refills | Status: DC
Start: 2020-09-14 — End: 2021-03-03

## 2020-09-14 MED ORDER — TRESIBA FLEXTOUCH 100 UNIT/ML ~~LOC~~ SOPN: PEN_INJECTOR | SUBCUTANEOUS | 3 refills | Status: DC

## 2020-09-14 MED ORDER — ONETOUCH ULTRA VI STRP: ORAL_STRIP | 4 refills | Status: DC

## 2020-09-14 MED ORDER — SHINGRIX 50 MCG/0.5ML IM SUSR: 0.5000 mL | Freq: Once | INTRAMUSCULAR | 0 refills | Status: AC

## 2020-09-14 MED ORDER — TETANUS-DIPHTH-ACELL PERTUSSIS 5-2.5-18.5 LF-MCG/0.5 IM SUSP: 0.5000 mL | Freq: Once | INTRAMUSCULAR | 0 refills | Status: AC

## 2020-09-14 NOTE — Patient Instructions (Addendum)
.   Go to the [facility] on [facility] for low back [place]  . Please go to the lab draw station in [address] on the second floor of [facility] . Normal hours are 8:00am to 11:30am and 1:00pm to 4:00pm Monday through Friday  . I recommend calling [facility] on [address] regarding trigger fingers.   Increase Tresiba by 2-4 units to keep morning sugars between 100 and 110

## 2020-09-14 NOTE — Progress Notes (Signed)
Established patient visit   Patient: [name]   DOB: 11-01-1950   70 y.o. Male  MRN: [id number] Visit Date: 09/14/2020  Today's healthcare provider: Lelon Huh, MD   No chief complaint on file.  Subjective    HPI  Diabetes Mellitus Type II, Follow-up  Lab Results  Component Value Date   HGBA1C 9.6 (A) 04/29/2020   HGBA1C 11.2 (A) 01/26/2020   HGBA1C 7.1 (A) 07/27/2019   Wt Readings from Last 3 Encounters:  04/29/20 154 lb (69.9 kg)  02/19/20 147 lb 9.6 oz (67 kg)  01/26/20 141 lb 9.6 oz (64.2 kg)   Last seen for diabetes 4 months ago.  Management since then includes increase- Insulin Glargine (BASAGLAR KWIKPEN) 100 UNIT/ML; to 28 Units into the skin daily. He has since changed to [place] and is taking up to 26 units a day depending on nighttime sugars.  He reports excellent compliance with treatment. He is not having side effects.  S Home blood sugar records: fasting range: 120-250  Episodes of hypoglycemia? No    Most Recent Eye Exam: 09/02/19 Current exercise: walking Current diet habits: in general, a "healthy" diet    Pertinent Labs: Lab Results  Component Value Date   CHOL 115 09/15/2018   HDL 52 09/15/2018   LDLCALC 49 09/15/2018   TRIG 68 09/15/2018   CHOLHDL 2.2 09/15/2018   Lab Results  Component Value Date   NA 131 (L) 03/03/2020   K 4.4 03/03/2020   CREATININE 0.71 (L) 03/03/2020   GFRNONAA 96 03/03/2020   GFRAA 111 03/03/2020   GLUCOSE 316 (H) 03/03/2020     ---------------------------------------------------------------------------------------------------      Medications: Outpatient Medications Prior to Visit  Medication Sig  . ACCU-CHEK FASTCLIX LANCETS MISC USE TO CHECK BLOOD SUGAR 3 TIMES A DAY  . allopurinol (ZYLOPRIM) 100 MG tablet TAKE 1 TABLET BY MOUTH EVERY DAY  . Blood Glucose Monitoring Suppl (ACCU-CHEK AVIVA PLUS) w/Device KIT Use to check blood sugar three times daily for insulin requiring type 2 diabetes  (E11.9)  . Calcium Citrate 200 MG TABS Take 200 mg by mouth daily. Reported on 11/21/2015  . carvedilol (COREG) 3.125 MG tablet Take 1 tablet (3.125 mg total) by mouth 2 (two) times daily.  . Cholecalciferol (D3-1000 PO) Take 1,000 mg by mouth daily.  . cyclobenzaprine (FLEXERIL) 5 MG tablet Take 1 tablet (5 mg total) by mouth 3 (three) times daily as needed for muscle spasms.  . furosemide (LASIX) 40 MG tablet Take 1 tablet (40 mg total) by mouth 2 (two) times daily. (Patient taking differently: Take 40 mg by mouth daily. )  . glucose blood (ACCU-CHEK AVIVA PLUS) test strip Use as instructed  . insulin aspart (NOVOLOG FLEXPEN) 100 UNIT/ML FlexPen INJECT UP TO 20 UNITS THREE TIMES A DAY BEFORE MEALS  . insulin degludec (TRESIBA FLEXTOUCH) 100 UNIT/ML FlexTouch Pen Inject up to 20 units every day at bedtime  . Insulin Pen Needle (PEN NEEDLES) 31G X 5 MM MISC 1 Device by Does not apply route 3 (three) times daily.  Marland Kitchen lisinopril (ZESTRIL) 10 MG tablet Take 1 tablet (10 mg total) by mouth daily. (Patient taking differently: Take 5 mg by mouth daily. )  . magnesium gluconate (MAGONATE) 500 MG tablet Take 500 mg by mouth daily.   . milk thistle 175 MG tablet Take 175 mg by mouth daily.  . Multiple Vitamin (MULTIVITAMIN WITH MINERALS) TABS tablet Take 1 tablet by mouth daily.  . niacin 500  MG tablet Take 500 mg by mouth at bedtime.  Marland Kitchen omeprazole (PRILOSEC) 40 MG capsule TAKE 1 CAPSULE BY MOUTH EVERY DAY  . Potassium Gluconate 550 MG TABS Take 550 mg by mouth daily.  (Patient not taking: Reported on 05/18/2020)  . promethazine (PHENERGAN) 25 MG tablet Take 1 tablet (25 mg total) by mouth every 8 (eight) hours as needed for nausea or vomiting.  . pyridOXINE (VITAMIN B-6) 50 MG tablet Take 50 mg by mouth daily.  . sertraline (ZOLOFT) 100 MG tablet TAKE 1 TABLET BY MOUTH EVERY DAY  . spironolactone (ALDACTONE) 100 MG tablet Take 1 tablet (100 mg total) by mouth daily.  . traZODone (DESYREL) 50 MG tablet  Take 50 mg by mouth at bedtime.  (Patient not taking: Reported on 05/18/2020)  . zinc gluconate 50 MG tablet Take 50 mg by mouth daily.   No facility-administered medications prior to visit.    Review of Systems     Objective    BP 125/87 (BP Location: Right Arm, Patient Position: Sitting, Cuff Size: Normal)   Pulse 69   Temp (!) 97.5 F (36.4 C) (Temporal)   Ht _0  (1.651 m)   Wt 163 lb 12.8 oz (74.3 kg)   SpO2 100%   BMI 27.26 kg/m     Physical Exam    General: Appearance:     Well developed, well nourished male in no acute distress  Eyes:    PERRL, conjunctiva/corneas clear, EOM's intact       Lungs:     Clear to auscultation bilaterally, respirations unlabored  Heart:    Normal heart rate. Normal rhythm. No murmurs, rubs, or gallops.   MS:   All extremities are intact.   Neurologic:   Awake, alert, oriented x 3. No apparent focal neurological           defect.        A1c=8.0  Assessment & Plan     1. Type 2 diabetes mellitus with hyperglycemia, with long-term current use of insulin (HCC) He is to bump up [place] by 2-4 units a day to get fastings 100-110  - Insulin Pen Needle (PEN NEEDLES) 31G X 5 MM MISC; 1 Device by Does not apply route 3 (three) times daily.  Dispense: 100 each; Refill: 4 - insulin degludec (TRESIBA FLEXTOUCH) 100 UNIT/ML FlexTouch Pen; Inject up to 30 units every day at bedtime  Dispense: 18 mL; Refill: 3 - glucose blood (ONETOUCH ULTRA) test strip; Use to check sugar three times daily for insulin dependent type 2 diabetes E11.9  Dispense: 100 each; Refill: 4  2. Primary hypertension Well controlled.  Continue current medications.   - CBC - Comprehensive metabolic panel - Lipid panel  3. Vitamin D deficiency  - VITAMIN D 25 Hydroxy (Vit-D Deficiency, Fractures)  4. Chronic bilateral low back pain without sciatica  - DG Lumbar Spine Complete; Future  5. Trigger fingers (right 4th and 5th) Recommend referral to [facility]. He  is going to check with his insurance to make sure they are in network.   6. Bilateral low back pain without sciatica, unspecified chronicity   7. Prostate cancer screening  - PSA Total (Reflex To Free) ([facility] only)  8. Prescription for Shingrix. Vaccine not administered in office.  - Zoster Vaccine Adjuvanted [facility]) injection; Inject 0.5 mLs into the muscle once for 1 dose.  Dispense: 0.5 mL; Refill: 0  9. Prescription for Tdap. Vaccine not administered in office.   - Tdap ([place]) 5-2.5-18.5 LF-MCG/0.5 injection;  Inject 0.5 mLs into the muscle once for 1 dose.  Dispense: 0.5 mL; Refill: 0   Diabetes follow up in 4 months.       The entirety of the information documented in the History of Present Illness, Review of Systems and Physical Exam were personally obtained by me. Portions of this information were initially documented by the CMA and reviewed by me for thoroughness and accuracy.      Lelon Huh, MD  [facility] [phone] (phone) [phone] (fax)  [place]

## 2020-09-15 ENCOUNTER — Other Ambulatory Visit: Payer: Self-pay | Admitting: Family Medicine

## 2020-09-15 NOTE — Telephone Encounter (Signed)
Requested Prescriptions  Pending Prescriptions Disp Refills   sertraline (ZOLOFT) 100 MG tablet [Pharmacy Med Name: SERTRALINE HCL 100 MG TABLET] 90 tablet 1    Sig: TAKE 1 TABLET BY MOUTH EVERY DAY     Psychiatry:  Antidepressants - SSRI Passed - 09/15/2020  1:26 AM      Passed - Completed PHQ-2 or PHQ-9 in the last 360 days      Passed - Valid encounter within last 6 months    Recent Outpatient Visits          Yesterday Type 2 diabetes mellitus with hyperglycemia, with long-term current use of insulin [facility])   [facility] Birdie Sons, MD   4 months ago Type 2 diabetes mellitus with hyperglycemia, with long-term current use of insulin [facility])   [facility] Birdie Sons, MD   6 months ago Type 2 diabetes mellitus with hyperglycemia, with long-term current use of insulin [facility])   [facility] [place], Dionne Bucy, MD   7 months ago Type 2 diabetes mellitus with hyperglycemia, with long-term current use of insulin [facility])   [facility] Birdie Sons, MD   1 year ago Abdominal swelling   [facility] Birdie Sons, MD      Future Appointments            In 4 months Fisher, [name], MD [facility], [place]

## 2020-09-16 ENCOUNTER — Ambulatory Visit
Admission: RE | Admit: 2020-09-16 | Discharge: 2020-09-16 | Disposition: A | Discharge status: Home / Self Care | Payer: HMO | Attending: Family Medicine | Admitting: Family Medicine

## 2020-09-16 ENCOUNTER — Other Ambulatory Visit: Payer: Self-pay

## 2020-09-16 ENCOUNTER — Ambulatory Visit
Admission: RE | Admit: 2020-09-16 | Discharge: 2020-09-16 | Disposition: A | Discharge status: Home / Self Care | Payer: HMO | Source: Ambulatory Visit | Attending: Family Medicine | Admitting: Family Medicine

## 2020-09-16 DIAGNOSIS — G8929 Other chronic pain: Secondary | ICD-10-CM | POA: Insufficient documentation

## 2020-09-16 DIAGNOSIS — M545 Low back pain, unspecified: Secondary | ICD-10-CM | POA: Insufficient documentation

## 2020-09-20 DIAGNOSIS — I10 Essential (primary) hypertension: Secondary | ICD-10-CM | POA: Diagnosis not present

## 2020-09-20 DIAGNOSIS — E559 Vitamin D deficiency, unspecified: Secondary | ICD-10-CM | POA: Diagnosis not present

## 2020-09-20 DIAGNOSIS — Z125 Encounter for screening for malignant neoplasm of prostate: Secondary | ICD-10-CM | POA: Diagnosis not present

## 2020-09-21 ENCOUNTER — Telehealth: Payer: Self-pay

## 2020-09-21 DIAGNOSIS — M5136 Other intervertebral disc degeneration, lumbar region: Secondary | ICD-10-CM

## 2020-09-21 LAB — CBC
Hematocrit: 39.5 % (ref 37.5–51.0)
Hemoglobin: 13.2 g/dL (ref 13.0–17.7)
MCH: 30.3 pg (ref 26.6–33.0)
MCHC: 33.4 g/dL (ref 31.5–35.7)
MCV: 91 fL (ref 79–97)
Platelets: 207 10*3/uL (ref 150–450)
RBC: 4.36 x10E6/uL (ref 4.14–5.80)
RDW: 13.2 % (ref 11.6–15.4)
WBC: 5.9 10*3/uL (ref 3.4–10.8)

## 2020-09-21 LAB — LIPID PANEL
Chol/HDL Ratio: 3 ratio (ref 0.0–5.0)
Cholesterol, Total: 112 mg/dL (ref 100–199)
HDL: 37 mg/dL — ABNORMAL LOW (ref >39)
LDL Chol Calc (NIH): 62 mg/dL (ref 0–99)
Triglycerides: 60 mg/dL (ref 0–149)
VLDL Cholesterol Cal: 13 mg/dL (ref 5–40)

## 2020-09-21 LAB — COMPREHENSIVE METABOLIC PANEL
ALT: 27 IU/L (ref 0–44)
AST: 30 IU/L (ref 0–40)
Albumin/Globulin Ratio: 1.7 (ref 1.2–2.2)
Albumin: 4.5 g/dL (ref 3.8–4.8)
Alkaline Phosphatase: 248 IU/L — ABNORMAL HIGH (ref 44–121)
BUN/Creatinine Ratio: 14 (ref 10–24)
BUN: 10 mg/dL (ref 8–27)
Bilirubin Total: 0.7 mg/dL (ref 0.0–1.2)
CO2: 24 mmol/L (ref 20–29)
Calcium: 9.3 mg/dL (ref 8.6–10.2)
Chloride: 96 mmol/L (ref 96–106)
Creatinine, Ser: 0.71 mg/dL — ABNORMAL LOW (ref 0.76–1.27)
Globulin, Total: 2.7 g/dL (ref 1.5–4.5)
Glucose: 192 mg/dL — ABNORMAL HIGH (ref 65–99)
Potassium: 4.8 mmol/L (ref 3.5–5.2)
Sodium: 136 mmol/L (ref 134–144)
Total Protein: 7.2 g/dL (ref 6.0–8.5)
eGFR: 99 mL/min/{1.73_m2} (ref >59)

## 2020-09-21 LAB — PSA TOTAL (REFLEX TO FREE): Prostate Specific Ag, Serum: 0.3 ng/mL (ref 0.0–4.0)

## 2020-09-21 LAB — VITAMIN D 25 HYDROXY (VIT D DEFICIENCY, FRACTURES): Vit D, 25-Hydroxy: 34.5 ng/mL (ref 30.0–100.0)

## 2020-09-21 NOTE — Telephone Encounter (Signed)
I called pt and pt verbalized understanding of information below. Pt agreed to ortho referral. He would like to stay with [facility] for referral.

## 2020-09-21 NOTE — Telephone Encounter (Signed)
-----   Message from Birdie Sons, MD sent at 09/20/2020 12:47 PM EDT ----- [name] shows severe arthritis and degenerative disc disease in back, which is probably compressing the nerves. Recommend referral to orthopedics for back pain and DDD.

## 2020-09-22 ENCOUNTER — Encounter: Payer: Self-pay | Admitting: Family Medicine

## 2020-09-27 ENCOUNTER — Other Ambulatory Visit: Payer: Self-pay | Admitting: Gastroenterology

## 2020-09-28 ENCOUNTER — Other Ambulatory Visit: Payer: Self-pay

## 2020-09-28 ENCOUNTER — Ambulatory Visit (INDEPENDENT_AMBULATORY_CARE_PROVIDER_SITE_OTHER): Payer: HMO | Admitting: Family Medicine

## 2020-09-28 DIAGNOSIS — M545 Low back pain, unspecified: Secondary | ICD-10-CM | POA: Diagnosis not present

## 2020-09-28 DIAGNOSIS — G8929 Other chronic pain: Secondary | ICD-10-CM | POA: Diagnosis not present

## 2020-09-28 NOTE — Progress Notes (Signed)
I saw and examined the patient with Dr. Elouise Munroe and agree with assessment and plan as outlined.    Former Tour manager with chronic LBP.  Mostly midline pain.  Severe DDD and facet DJD.    Will refer for facet injections followed by PT.  MRI if fails to improve.

## 2020-09-28 NOTE — Progress Notes (Signed)
Office Visit Note   Patient: [name]           Date of Birth: [birth year]           MRN: [id number] Visit Date: 09/28/2020 Requested by: Birdie Sons, [address] [place],  [place] [address] PCP: Birdie Sons, MD  Subjective: Chief Complaint  Patient presents with  . Lower Back - Pain    Chronic pain. Hurts across the lower back. Only occasionally goes down the legs. Stabbing pain in the back when he bends over - that is his worst pain. Will have dull aching in the back with much walking or working outside.    HPI: 70yo M presenting to clinic with concerns of chronic lower back pain, focused across his lumbar spine. 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 Over the past few years, he has experienced a progressive worsening of his back pain, which he says will occasionally radiate into his legs, which is no longer improved by over the counter arthritis medications, nor meloxicam. He says he tries to stay very active throughout the day- upkeeping his country property as well as several other properties for his family members. He says 'I'm able to push through the pain, but some days it's just miserable.' He has never been prescribed medications for this symptoms, and has never done physical therapy. No weakness/numbness in either leg, and no saddle anesthesia/new bowel/bladder dysfunction.               ROS:   All other systems were reviewed and are negative.  Objective: Vital Signs: There were no vitals taken for this visit.  Physical Exam:  General:  Alert and oriented, in no acute distress. Pulm:  Breathing unlabored. Psy:  Normal mood, congruent affect. Skin:  Lower back overlying skin intact. No bruising, no rashes, no erythema.    BACK EXAMINATION: Normal Gait.  Normal Spinal curvature, without excessive lumbar lordosis, thoracic kyphosis, or  scoliosis.  ROM: Endorses Pain with forward flexion, though able to achieve toe-touch. Also endorses pain with extension, though ROM preserved.   Palpation: Endorses tenderness along bilateral lumbar paraspinal muscles around L4-L5, L5-S1. No tenderness over the SI Joints bilaterally. Gluteus musculature and piriformis without tender points. Negative Sacral Spring's test.  Strength: Hip flexion (L1), Hip Aduction (L2), Knee Extension (L3) are 5/5 Bilaterally Foot Inversion (L4), Dorsiflexion (L5), and Eversion (S1) 5/5 Bilaterally Sensation: Intact to light touch medial and lateral aspects of lower extremities, and lateral, dorsal, and medial aspects of foot.  Reflexes: Patellar (L4), and Ankle (S1) 2+ Bilaterally Special Tests:  FABER causes No SI Joint Pain Limb Length: Hips Aligned, No obvious discrepancy at medial malleolus      Imaging: No results found.  Assessment & Plan: 70yo M presenting to clinic with concerns of chronic lower back pain, typically non-radiating, with no red-flag symptoms. [facility]rays from [facility] reviewed, which demonstrate severe disc degeneration and facet arthropathy at the lower lumbar spine. - Will place referral to physical therapy for lumbar strengthening exercises given his high level of function. - Will also place referral to Dr Ernestina Patches for Facet injections to help kickstart his pain.  - Patient is agreeable with plan. He has no further questions or concerns.       Procedures: No procedures performed        PMFS History: Patient Active Problem  List   Diagnosis Date Noted  . Gynecomastia, male 02/19/2020  . Gastric varices   . Hyperuricemia 10/02/2016  . Alcohol abuse 03/30/2015  . Dermatitis, eczematoid 03/20/2015  . GERD (gastroesophageal reflux disease) 03/20/2015  . History of Helicobacter pylori infection 03/20/2015  . Hearing loss of right ear 03/20/2015  . Hernia, inguinal, left 03/20/2015  . Supraspinatus sprain and strain 03/20/2015  .  Cirrhosis ([place]) 03/20/2015  . Scalp laceration 02/28/2015  . Rib contusion 02/28/2015  . Splenic vein thrombosis   . History of portal hypertension   . Blood clotting disorder ([place]) 02/11/2014  . Odynophagia 02/10/2014  . Chronic pancreatitis ([place]) 12/08/2013  . Abnormal liver enzymes 12/08/2013  . Esophagitis 12/08/2013  . Gastric catarrh 12/08/2013  . H/O adenomatous polyp of colon 09/23/2012  . Esophageal varices ([place]) 09/09/2012  . DDD (degenerative disc disease), cervical 08/17/2011  . Vitamin D deficiency 08/19/2009  . Type 2 diabetes mellitus with hyperglycemia, with long-term current use of insulin ([place]) 10/01/2007  . 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 Depression 05/13/2007  . 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 Obesity 07/09/2005  . Hypertension 07/09/1998   Past Medical History:  Diagnosis Date  . Alcoholism ([place])   . 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 Diabetes mellitus without complication ([place])   . Dyspnea   . Elevated liver enzymes   . Esophageal varices ([place])   . Gastritis   . GERD (gastroesophageal reflux disease)   . Hyperlipidemia   . Hypertension   . Pancreatitis     Family History  Problem Relation Age of Onset  . Colon polyps Father   . Lung cancer Father   . Hypertension Father   . Dementia Mother   . Hypertension Brother   . Heart Problems Brother   . Hypertension Brother   . Diabetes Brother        type 2  . Congestive Heart Failure Brother     Past Surgical History:  Procedure Laterality Date  . CHOLECYSTECTOMY  2006  . COLONOSCOPY WITH ESOPHAGOGASTRODUODENOSCOPY (EGD)    . COLONOSCOPY WITH PROPOFOL N/A 11/21/2015   Procedure: COLONOSCOPY WITH PROPOFOL;  Surgeon: Manya Silvas, MD;  Location[facility] ENDOSCOPY;  Service: Endoscopy;  Laterality: N/A;  . ESOPHAGOGASTRODUODENOSCOPY (EGD) WITH PROPOFOL N/A 08/21/2019   Procedure: ESOPHAGOGASTRODUODENOSCOPY (EGD) WITH PROPOFOL;  Surgeon:  Lucilla Lame, MD;  Location: [facility] ENDOSCOPY;  Service: Endoscopy;  Laterality: N/A;  . HERNIA REPAIR     Inguinal Hernia Repai. Left: 07/28/2013 Dr. [name]; 2nd repair done 04/30/2014  . TONSILLECTOMY     Social History   Occupational History  . Occupation: retired  Tobacco Use  . Smoking status: Never Smoker  . Smokeless tobacco: Never Used  Vaping Use  . Vaping Use: Never used  Substance and Sexual Activity  . Alcohol use: Not Currently    Alcohol/week: 0.0 standard drinks    Comment: previous heavy drinker  . Drug use: No  . Sexual activity: Yes

## 2020-09-28 NOTE — Telephone Encounter (Signed)
See lab result note.

## 2020-10-09 ENCOUNTER — Other Ambulatory Visit: Payer: Self-pay | Admitting: Family Medicine

## 2020-10-09 NOTE — Telephone Encounter (Signed)
Requested medication (s) are due for refill today: yes  Requested medication (s) are on the active medication list: yes  Last refill:  06/16/20  Future visit scheduled: yes  Notes to clinic:  overdue lab work   Requested Prescriptions  Pending Prescriptions Disp Refills   allopurinol (ZYLOPRIM) 100 MG tablet [Pharmacy Med Name: ALLOPURINOL 100 MG TABLET] 90 tablet 0    Sig: TAKE 1 TABLET BY [place]      Endocrinology:  Gout Agents Failed - 10/09/2020 10:00 AM      Failed - Uric Acid in normal range and within 360 days    Uric Acid  Date Value Ref Range Status  10/02/2016 2.6 (L) 3.7 - 8.6 mg/dL Final    Comment:               Therapeutic target for gout patients: <6.0          Failed - Cr in normal range and within 360 days    Creat  Date Value Ref Range Status  06/10/2017 0.81 0.70 - 1.25 mg/dL Final    Comment:    For patients >75 years of age, the reference limit for Creatinine is approximately 13% higher for people identified as African-American. .    Creatinine, Ser  Date Value Ref Range Status  09/20/2020 0.71 (L) 0.76 - 1.27 mg/dL Final          Passed - Valid encounter within last 12 months    Recent Outpatient Visits           3 weeks ago Type 2 diabetes mellitus with hyperglycemia, with long-term current use of insulin [facility])   [facility] Birdie Sons, MD   5 months ago Type 2 diabetes mellitus with hyperglycemia, with long-term current use of insulin [facility])   [facility] Birdie Sons, MD   7 months ago Type 2 diabetes mellitus with hyperglycemia, with long-term current use of insulin [facility])   [facility] [place], Dionne Bucy, MD   8 months ago Type 2 diabetes mellitus with hyperglycemia, with long-term current use of insulin [facility])   [facility] Birdie Sons, MD   1 year ago Abdominal swelling   [facility] Birdie Sons, MD       Future Appointments              In 3 months Fisher, [name], MD [facility], [place]

## 2020-10-10 ENCOUNTER — Other Ambulatory Visit: Payer: Self-pay | Admitting: Gastroenterology

## 2020-10-17 ENCOUNTER — Telehealth: Payer: Self-pay | Admitting: Physical Medicine and Rehabilitation

## 2020-10-17 ENCOUNTER — Ambulatory Visit: Payer: HMO | Admitting: Physical Medicine and Rehabilitation

## 2020-10-17 NOTE — Telephone Encounter (Signed)
Rescheduled

## 2020-10-17 NOTE — Telephone Encounter (Signed)
Patient called needing to reschedule his appointment. The number to contact patient is [phone]

## 2020-10-25 ENCOUNTER — Ambulatory Visit (INDEPENDENT_AMBULATORY_CARE_PROVIDER_SITE_OTHER): Payer: HMO | Admitting: Physical Medicine and Rehabilitation

## 2020-10-25 ENCOUNTER — Encounter: Payer: Self-pay | Admitting: Physical Medicine and Rehabilitation

## 2020-10-25 ENCOUNTER — Ambulatory Visit: Payer: Self-pay

## 2020-10-25 ENCOUNTER — Other Ambulatory Visit: Payer: Self-pay

## 2020-10-25 VITALS — BP 112/74 | HR 66

## 2020-10-25 DIAGNOSIS — M47816 Spondylosis without myelopathy or radiculopathy, lumbar region: Secondary | ICD-10-CM

## 2020-10-25 MED ORDER — BETAMETHASONE SOD PHOS & ACET 6 (3-3) MG/ML IJ SUSP
12.0000 mg | Freq: Once | INTRAMUSCULAR | Status: AC
  Administered 2020-10-25: 12 mg

## 2020-10-25 NOTE — Progress Notes (Signed)
[name] - 70 y.o. male MRN [id number]  Date of birth: December 24, 1950  Office Visit Note: Visit Date: 10/25/2020 PCP: Birdie Sons, MD Referred by: Birdie Sons, MD  Subjective: Chief Complaint  Patient presents with  . Lower Back - Pain   HPI:  [name] is a 70 y.o. male who comes in today at the request of Dr. Eunice Blase for planned Bilateral  L4-L5 and L5-S1 Lumbar facet/medial branch block with fluoroscopic guidance.  The patient has failed conservative care including home exercise, medications, time and activity modification.  This injection will be diagnostic and hopefully therapeutic.  Please see requesting physician notes for further details and justification.  Exam has shown concordant pain with facet joint loading.   ROS Otherwise per HPI.  Assessment & Plan: Visit Diagnoses:    ICD-10-CM   1. Spondylosis without myelopathy or radiculopathy, lumbar region  M47.816 XR C-ARM NO REPORT    Facet Injection    betamethasone acetate-betamethasone sodium phosphate (CELESTONE) injection 12 mg    Plan: No additional findings.   Meds & Orders:  Meds ordered this encounter  Medications  . betamethasone acetate-betamethasone sodium phosphate (CELESTONE) injection 12 mg    Orders Placed This Encounter  Procedures  . Facet Injection  . XR C-ARM NO REPORT    Follow-up: Return if symptoms worsen or fail to improve.   Procedures: No procedures performed  Lumbar Diagnostic Facet Joint Nerve Block with Fluoroscopic Guidance   Patient: [name]      Date of Birth: [birth year] MRN: [id number] PCP: Birdie Sons, MD      Visit Date: 10/25/2020   Universal Protocol:    Date/Time: 04/19/221:08 PM  Consent Given By: the patient  Position: PRONE  Additional Comments: Vital signs were monitored before and after the procedure. Patient was prepped and draped in the usual sterile fashion. The correct patient, procedure, and site was verified.   Injection  Procedure Details:   Procedure diagnoses:  1. Spondylosis without myelopathy or radiculopathy, lumbar region      Meds Administered:  Meds ordered this encounter  Medications  . betamethasone acetate-betamethasone sodium phosphate (CELESTONE) injection 12 mg     Laterality: Bilateral  Location/Site: L4-L5, L3 and L4 medial branches and L5-S1, L4 medial branch and L5 dorsal ramus  Needle: 5.0 in., 25 ga.  Short bevel or Quincke spinal needle  Needle Placement: Oblique pedical  Findings:   -Comments: There was excellent flow of contrast along the articular pillars without intravascular flow.  Procedure Details: The fluoroscope beam is vertically oriented in AP and then obliqued 15 to 20 degrees to the ipsilateral side of the desired nerve to achieve the "Scotty dog" appearance.  The skin over the target area of the junction of the superior articulating process and the transverse process (sacral ala if blocking the L5 dorsal rami) was locally anesthetized with a 1 ml volume of 1% Lidocaine without Epinephrine.  The spinal needle was inserted and advanced in a trajectory view down to the target.   After contact with periosteum and negative aspirate for blood and CSF, correct placement without intravascular or epidural spread was confirmed by injecting 0.5 ml. of Isovue-250.  A spot radiograph was obtained of this image.    Next, a 0.5 ml. volume of the injectate described above was injected. The needle was then redirected to the other facet joint nerves mentioned above if needed.  Prior to the procedure, the patient was given a Pain  Diary which was completed for baseline measurements.  After the procedure, the patient rated their pain every 30 minutes and will continue rating at this frequency for a total of 5 hours.  The patient has been asked to complete the Diary and return to [place] by mail, fax or hand delivered as soon as possible.   Additional Comments:  The patient tolerated the  procedure well Dressing: 2 x 2 sterile gauze and Band-Aid    Post-procedure details: Patient was observed during the procedure. Post-procedure instructions were reviewed.  Patient left the clinic in stable condition.     Clinical History: LUMBAR SPINE - COMPLETE 4+ VIEW  COMPARISON:  None.  FINDINGS: No fracture or dislocation of the lumbar spine. There is focally severe disc space height loss, osteophytosis, and degenerative anterolisthesis of L4 on L5. Disc spaces are otherwise preserved with minimal osteophytosis. Moderate facet degenerative change of the lower lumbar spine. Nonobstructive pattern of overlying bowel gas.  IMPRESSION: 1. No fracture or dislocation of the lumbar spine.  2. There is focally severe disc space height loss, osteophytosis, and degenerative anterolisthesis of L4 on L5. Disc spaces are otherwise preserved with minimal osteophytosis. Moderate facet degenerative change of the lower lumbar spine.  3. Lumbar disc and neural foraminal pathology may be further evaluated by MRI if indicated by neurologically localizing signs and symptoms.   Electronically Signed   By: Eddie Candle M.D.   On: 09/19/2020 08:24     Objective:  VS:  HT:    WT:   BMI:     BP:112/74  HR:66bpm  TEMP: ( )  RESP:  Physical Exam Vitals and nursing note reviewed.  Constitutional:      General: He is not in acute distress.    Appearance: Normal appearance. He is not ill-appearing.  HENT:     Head: Normocephalic and atraumatic.     Right Ear: External ear normal.     Left Ear: External ear normal.     Nose: No congestion.  Eyes:     Extraocular Movements: Extraocular movements intact.  Cardiovascular:     Rate and Rhythm: Normal rate.     Pulses: Normal pulses.  Pulmonary:     Effort: Pulmonary effort is normal. No respiratory distress.  Abdominal:     General: There is no distension.     Palpations: Abdomen is soft.  Musculoskeletal:         General: No tenderness or signs of injury.     Cervical back: Neck supple.     Right lower leg: No edema.     Left lower leg: No edema.     Comments: Patient has good distal strength without clonus. Patient somewhat slow to rise from a seated position to full extension.  There is concordant low back pain with facet loading and lumbar spine extension rotation.  There are no definitive trigger points but the patient is somewhat tender across the lower back and PSIS.  There is no pain with hip rotation.   Skin:    Findings: No erythema or rash.  Neurological:     General: No focal deficit present.     Mental Status: He is alert and oriented to person, place, and time.     Sensory: No sensory deficit.     Motor: No weakness or abnormal muscle tone.     Coordination: Coordination normal.  Psychiatric:        Mood and Affect: Mood normal.  Behavior: Behavior normal.      Imaging: XR C-ARM NO REPORT  Result Date: 10/25/2020 Please see Notes tab for imaging impression.

## 2020-10-25 NOTE — Procedures (Signed)
Lumbar Diagnostic Facet Joint Nerve Block with Fluoroscopic Guidance   Patient: [name]      Date of Birth: September 06, 1950 MRN: [id number] PCP: Birdie Sons, MD      Visit Date: 10/25/2020   Universal Protocol:    Date/Time: 04/19/221:08 PM  Consent Given By: the patient  Position: PRONE  Additional Comments: Vital signs were monitored before and after the procedure. Patient was prepped and draped in the usual sterile fashion. The correct patient, procedure, and site was verified.   Injection Procedure Details:   Procedure diagnoses:  1. Spondylosis without myelopathy or radiculopathy, lumbar region      Meds Administered:  Meds ordered this encounter  Medications  . betamethasone acetate-betamethasone sodium phosphate (CELESTONE) injection 12 mg     Laterality: Bilateral  Location/Site: L4-L5, L3 and L4 medial branches and L5-S1, L4 medial branch and L5 dorsal ramus  Needle: 5.0 in., 25 ga.  Short bevel or Quincke spinal needle  Needle Placement: Oblique pedical  Findings:   -Comments: There was excellent flow of contrast along the articular pillars without intravascular flow.  Procedure Details: The fluoroscope beam is vertically oriented in AP and then obliqued 15 to 20 degrees to the ipsilateral side of the desired nerve to achieve the "Scotty dog" appearance.  The skin over the target area of the junction of the superior articulating process and the transverse process (sacral ala if blocking the L5 dorsal rami) was locally anesthetized with a 1 ml volume of 1% Lidocaine without Epinephrine.  The spinal needle was inserted and advanced in a trajectory view down to the target.   After contact with periosteum and negative aspirate for blood and CSF, correct placement without intravascular or epidural spread was confirmed by injecting 0.5 ml. of Isovue-250.  A spot radiograph was obtained of this image.    Next, a 0.5 ml. volume of the injectate described above  was injected. The needle was then redirected to the other facet joint nerves mentioned above if needed.  Prior to the procedure, the patient was given a Pain Diary which was completed for baseline measurements.  After the procedure, the patient rated their pain every 30 minutes and will continue rating at this frequency for a total of 5 hours.  The patient has been asked to complete the Diary and return to [place] by mail, fax or hand delivered as soon as possible.   Additional Comments:  The patient tolerated the procedure well Dressing: 2 x 2 sterile gauze and Band-Aid    Post-procedure details: Patient was observed during the procedure. Post-procedure instructions were reviewed.  Patient left the clinic in stable condition.

## 2020-10-25 NOTE — Patient Instructions (Signed)

## 2020-10-25 NOTE — Progress Notes (Signed)
Pt state lower back pain. Pt state bending and standing and walking makes the pain worse. Pt state he take pain meds and heating pads help ease the pain.  Numeric Pain Rating Scale and Functional Assessment Average Pain 6   In the last MONTH (on 0-10 scale) has pain interfered with the following?  1. General activity like being  able to carry out your everyday physical activities such as walking, climbing stairs, carrying groceries, or moving a chair?  Rating(10)   +Driver, -BT, -Dye Allergies.

## 2020-10-28 ENCOUNTER — Telehealth: Payer: Self-pay

## 2020-10-28 DIAGNOSIS — E1165 Type 2 diabetes mellitus with hyperglycemia: Secondary | ICD-10-CM

## 2020-10-28 DIAGNOSIS — Z794 Long term (current) use of insulin: Secondary | ICD-10-CM

## 2020-10-28 NOTE — Telephone Encounter (Signed)
Copied from [place] [phone]. Topic: Referral - Request for Referral >> Oct 28, 2020 10:09 AM Lennox Solders wrote: Has patient seen PCP for this complaint? Yes . Pt has an appt on Monday 10-31-2020 for diabetic eye exam. Pt has healthteam advantage hmo and that plan needs a referral. Eye center [address] in [place] . Pt will see Dr Kerin Ransom. The phone number is [phone] and fax number [phone]

## 2021-01-05 ENCOUNTER — Other Ambulatory Visit: Payer: Self-pay | Admitting: Family Medicine

## 2021-01-05 DIAGNOSIS — K219 Gastro-esophageal reflux disease without esophagitis: Secondary | ICD-10-CM

## 2021-01-05 NOTE — Telephone Encounter (Signed)
   Notes to clinic:  This script has expired  Review continued use and refill    Requested Prescriptions  Pending Prescriptions Disp Refills   omeprazole (PRILOSEC) 40 MG capsule [Pharmacy Med Name: OMEPRAZOLE DR 40 MG CAPSULE] 90 capsule 4    Sig: TAKE 1 CAPSULE BY MOUTH EVERY DAY      Gastroenterology: Proton Pump Inhibitors Passed - 01/05/2021  2:21 PM      Passed - Valid encounter within last 12 months    Recent Outpatient Visits           3 months ago Type 2 diabetes mellitus with hyperglycemia, with long-term current use of insulin [facility])   [facility] Birdie Sons, MD   8 months ago Type 2 diabetes mellitus with hyperglycemia, with long-term current use of insulin [facility])   [facility] Birdie Sons, MD   10 months ago Type 2 diabetes mellitus with hyperglycemia, with long-term current use of insulin [facility])   [facility], Dionne Bucy, MD   11 months ago Type 2 diabetes mellitus with hyperglycemia, with long-term current use of insulin [facility])   [facility] Birdie Sons, MD   1 year ago Abdominal swelling   [facility] Birdie Sons, MD       Future Appointments             In 1 week Fisher, [name], MD [facility], [place]

## 2021-01-17 ENCOUNTER — Encounter: Payer: Self-pay | Admitting: Family Medicine

## 2021-01-17 ENCOUNTER — Ambulatory Visit (INDEPENDENT_AMBULATORY_CARE_PROVIDER_SITE_OTHER): Payer: HMO | Admitting: Family Medicine

## 2021-01-17 ENCOUNTER — Other Ambulatory Visit: Payer: Self-pay

## 2021-01-17 VITALS — BP 145/94 | HR 80 | Temp 97.5°F | Resp 16 | Wt 148.0 lb

## 2021-01-17 DIAGNOSIS — Z794 Long term (current) use of insulin: Secondary | ICD-10-CM

## 2021-01-17 DIAGNOSIS — R21 Rash and other nonspecific skin eruption: Secondary | ICD-10-CM | POA: Diagnosis not present

## 2021-01-17 DIAGNOSIS — R110 Nausea: Secondary | ICD-10-CM

## 2021-01-17 DIAGNOSIS — K7030 Alcoholic cirrhosis of liver without ascites: Secondary | ICD-10-CM | POA: Diagnosis not present

## 2021-01-17 DIAGNOSIS — E559 Vitamin D deficiency, unspecified: Secondary | ICD-10-CM | POA: Diagnosis not present

## 2021-01-17 DIAGNOSIS — E1165 Type 2 diabetes mellitus with hyperglycemia: Secondary | ICD-10-CM | POA: Diagnosis not present

## 2021-01-17 LAB — POCT GLYCOSYLATED HEMOGLOBIN (HGB A1C)
Est. average glucose Bld gHb Est-mCnc: 137
Hemoglobin A1C: 6.4 % — AB (ref 4.0–5.6)

## 2021-01-17 MED ORDER — PROMETHAZINE HCL 25 MG PO TABS: 25.0000 mg | ORAL_TABLET | Freq: Three times a day (TID) | ORAL | 3 refills | Status: DC | PRN

## 2021-01-17 NOTE — Progress Notes (Signed)
Established patient visit   Patient: [name]   DOB: [birth year]   70 y.o. Male  MRN: [id number] Visit Date: 01/17/2021  Today's healthcare provider: Lelon Huh, MD   Chief Complaint  Patient presents with   Diabetes   Hypertension   Subjective    HPI  Diabetes Mellitus Type II, Follow-up  Lab Results  Component Value Date   HGBA1C 6.4 (A) 01/17/2021   HGBA1C 8.0 (A) 09/14/2020   HGBA1C 9.6 (A) 04/29/2020   Wt Readings from Last 3 Encounters:  01/17/21 148 lb (67.1 kg)  09/14/20 163 lb 12.8 oz (74.3 kg)  04/29/20 154 lb (69.9 kg)   Last seen for diabetes 4 months ago.  Management since then includes advising patient to increase Trisiba by 2-4 units a day to get fastings 100-110. He reports good compliance with treatment. He is not having side effects.  Symptoms: No fatigue No foot ulcerations  No appetite changes No nausea  No paresthesia of the feet  No polydipsia  No polyuria No visual disturbances   No vomiting     Home blood sugar records: fasting range: 100  Episodes of hypoglycemia? Yes occurs 2-3 times a week (blood sugars are sometimes in the low 70's)   Current insulin regiment: Tresiba 20 units at bedtime, Novolog sliding scale before meals Most Recent Eye Exam: not UTD Current exercise: none Current diet habits: in general, an "unhealthy" diet  Pertinent Labs: Lab Results  Component Value Date   CHOL 112 09/20/2020   HDL 37 (L) 09/20/2020   LDLCALC 62 09/20/2020   TRIG 60 09/20/2020   CHOLHDL 3.0 09/20/2020   Lab Results  Component Value Date   NA 136 09/20/2020   K 4.8 09/20/2020   CREATININE 0.71 (L) 09/20/2020   GFRNONAA 96 03/03/2020   GFRAA 111 03/03/2020   GLUCOSE 192 (H) 09/20/2020     ---------------------------------------------------------------------------------------------------   Hypertension, follow-up  BP Readings from Last 3 Encounters:  01/17/21 (!) 145/94  10/25/20 112/74  09/14/20 125/87   Wt  Readings from Last 3 Encounters:  01/17/21 148 lb (67.1 kg)  09/14/20 163 lb 12.8 oz (74.3 kg)  04/29/20 154 lb (69.9 kg)     He was last seen for hypertension 4 months ago.  BP at that visit was 125/87. Management since that visit includes continue same medication.  He reports good compliance with treatment. He is not having side effects.  He is following a Regular diet. He is exercising. He does not smoke.  Use of agents associated with hypertension: none.   Outside blood pressures are not checked. Symptoms: No chest pain No chest pressure  No palpitations No syncope  No dyspnea No orthopnea  No paroxysmal nocturnal dyspnea No lower extremity edema   Pertinent labs: Lab Results  Component Value Date   CHOL 112 09/20/2020   HDL 37 (L) 09/20/2020   LDLCALC 62 09/20/2020   TRIG 60 09/20/2020   CHOLHDL 3.0 09/20/2020   Lab Results  Component Value Date   NA 136 09/20/2020   K 4.8 09/20/2020   CREATININE 0.71 (L) 09/20/2020   GFRNONAA 96 03/03/2020   GFRAA 111 03/03/2020   GLUCOSE 192 (H) 09/20/2020     The ASCVD Risk score (Goff DC Jr., et al., 2013) failed to calculate for the following reasons:   The valid total cholesterol range is 130 to 320 mg/dL   ---------------------------------------------------------------------------------------------------   Rash: Patient complains of a red itchy rash on his lower  forearms. Rash appeared a couple of weeks ago and seems to be improving. Patient has not tried any medications.     Medications: Outpatient Medications Prior to Visit  Medication Sig   allopurinol (ZYLOPRIM) 100 MG tablet TAKE 1 TABLET BY MOUTH EVERY DAY   Calcium Citrate 200 MG TABS Take 200 mg by mouth daily. Reported on 11/21/2015   carvedilol (COREG) 3.125 MG tablet TAKE 1 TABLET (3.125 MG TOTAL) BY MOUTH 2 (TWO) TIMES DAILY.   Cholecalciferol (D3-1000 PO) Take 1,000 mg by mouth daily.   cyclobenzaprine (FLEXERIL) 5 MG tablet Take 1 tablet (5 mg total)  by mouth 3 (three) times daily as needed for muscle spasms.   furosemide (LASIX) 40 MG tablet TAKE 1 TABLET BY MOUTH TWICE A DAY   glucose blood (ONETOUCH ULTRA) test strip Use to check sugar three times daily for insulin dependent type 2 diabetes E11.9   insulin aspart (NOVOLOG FLEXPEN) 100 UNIT/ML FlexPen INJECT UP TO 20 UNITS THREE TIMES A DAY BEFORE MEALS   insulin degludec (TRESIBA FLEXTOUCH) 100 UNIT/ML FlexTouch Pen Inject up to 30 units every day at bedtime   Insulin Pen Needle (PEN NEEDLES) 31G X 5 MM MISC 1 Device by Does not apply route 3 (three) times daily.   lisinopril (ZESTRIL) 10 MG tablet Take 1 tablet (10 mg total) by mouth daily. (Patient taking differently: Take 5 mg by mouth daily.)   magnesium gluconate (MAGONATE) 500 MG tablet Take 500 mg by mouth daily.    Multiple Vitamin (MULTIVITAMIN WITH MINERALS) TABS tablet Take 1 tablet by mouth daily.   niacin 500 MG tablet Take 500 mg by mouth at bedtime.   omeprazole (PRILOSEC) 40 MG capsule TAKE 1 CAPSULE BY MOUTH EVERY DAY   Potassium Gluconate 550 MG TABS Take 550 mg by mouth daily.   promethazine (PHENERGAN) 25 MG tablet Take 1 tablet (25 mg total) by mouth every 8 (eight) hours as needed for nausea or vomiting.   pyridOXINE (VITAMIN B-6) 50 MG tablet Take 50 mg by mouth daily.   sertraline (ZOLOFT) 100 MG tablet TAKE 1 TABLET BY MOUTH EVERY DAY   spironolactone (ALDACTONE) 100 MG tablet Take 1 tablet (100 mg total) by mouth daily.   zinc gluconate 50 MG tablet Take 50 mg by mouth daily.   No facility-administered medications prior to visit.    Review of Systems  Constitutional:  Negative for appetite change, chills and fever.  Respiratory:  Negative for chest tightness, shortness of breath and wheezing.   Cardiovascular:  Negative for chest pain and palpitations.  Gastrointestinal:  Negative for abdominal pain, nausea and vomiting.  Endocrine: Positive for polydipsia. Negative for polyphagia and polyuria.  Skin:   Positive for rash (on lower arms).  Neurological:        Tingling in hands      Objective    BP (!) 145/94   Pulse 80   Temp (!) 97.5 F (36.4 C) (Temporal)   Resp 16   Wt 148 lb (67.1 kg)   BMI 24.63 kg/m   Today's Vitals   01/17/21 0941 01/17/21 0945  BP: (!) 145/92 (!) 145/94  Pulse: 80   Resp: 16   Temp: (!) 97.5 F (36.4 C)   TempSrc: Temporal   Weight: 148 lb (67.1 kg)    Body mass index is 24.63 kg/m.     Physical Exam  General appearance: Well developed, well nourished male, cooperative and in no acute distress Head: Normocephalic, without obvious abnormality, atraumatic Respiratory:  Respirations even and unlabored, normal respiratory rate Extremities: All extremities are intact.  Skin: Skin color, texture, turgor normal. Scattered papular lesions on both arms with a few denuded areas. No discharge or erythema.  Psych: Appropriate mood and affect. Neurologic: Mental status: Alert, oriented to person, place, and time, thought content appropriate.   Results for orders placed or performed in visit on 01/17/21  POCT HgB A1C  Result Value Ref Range   Hemoglobin A1C 6.4 (A) 4.0 - 5.6 %   Est. average glucose Bld gHb Est-mCnc 137     Assessment & Plan     1. Type 2 diabetes mellitus with hyperglycemia, with long-term current use of insulin (HCC) Well controlled with occasional hypoglycemia in the morning. Will reduce Tresiba from 20 to 18 units at night. Continue SSI  2. Alcoholic cirrhosis of liver without ascites (HCC) Abstaining from alcohol. Need HCC screening.  - US Abdomen Limited RUQ (LIVER/GB); Future  3. Vitamin D deficiency Doing well on vitamin D supplementation.   4. Rash Has been going on for about 2 weeks, appears allergic. Can apply OTC cortisone to intact area, but antibiotic ointment to open areas.   5. Nausea refill promethazine (PHENERGAN) 25 MG tablet; Take 1 tablet (25 mg total) by mouth every 8 (eight) hours as needed for nausea or  vomiting.  Dispense: 20 tablet; Refill: 3   Follow up and AWV in November      The entirety of the information documented in the History of Present Illness, Review of Systems and Physical Exam were personally obtained by me. Portions of this information were initially documented by the CMA and reviewed by me for thoroughness and accuracy.     Lelon Huh, MD  [facility] [phone] (phone) [phone] (fax)  [place]

## 2021-01-17 NOTE — Patient Instructions (Addendum)
Please review the attached list of medications and notify my office if there are any errors.   Please bring all of your medications to every appointment so we can make sure that our medication list is the same as yours.   You can cut Tresiba back to 18 units every day  You can apply hydrocortisone cream to intact areas of itchy skin. You can use antibiotic ointment once a day on an open sores.

## 2021-01-24 ENCOUNTER — Other Ambulatory Visit: Payer: Self-pay | Admitting: Family Medicine

## 2021-01-24 DIAGNOSIS — E1165 Type 2 diabetes mellitus with hyperglycemia: Secondary | ICD-10-CM

## 2021-01-24 DIAGNOSIS — Z794 Long term (current) use of insulin: Secondary | ICD-10-CM

## 2021-02-06 ENCOUNTER — Ambulatory Visit: Payer: Self-pay

## 2021-02-06 NOTE — Telephone Encounter (Signed)
Pt. Reports started feeling bad last night. Has vomiting, legs are achy, dizziness.Has a COVID 19 test at home and will perform the test. Virtual visit made for tomorrow.

## 2021-02-06 NOTE — Telephone Encounter (Signed)
FYI

## 2021-02-06 NOTE — Telephone Encounter (Signed)
Reason for Disposition  [1] MILD or MODERATE vomiting AND [2] present > 48 hours (2 days) (Exception: mild vomiting with associated diarrhea)  Answer Assessment - Initial Assessment Questions 1. VOMITING SEVERITY: "How many times have you vomited in the past 24 hours?"     - MILD:  1 - 2 times/day    - MODERATE: 3 - 5 times/day, decreased oral intake without significant weight loss or symptoms of dehydration    - SEVERE: 6 or more times/day, vomits everything or nearly everything, with significant weight loss, symptoms of dehydration      2 2. ONSET: "When did the vomiting begin?"      Yesterday 3. FLUIDS: "What fluids or food have you vomited up today?" "Have you been able to keep any fluids down?"     Yes 4. ABDOMINAL PAIN: "Are your having any abdominal pain?" If yes : "How bad is it and what does it feel like?" (e.g., crampy, dull, intermittent, constant)      No 5. DIARRHEA: "Is there any diarrhea?" If Yes, ask: "How many times today?"      No 6. CONTACTS: "Is there anyone else in the family with the same symptoms?"      No 7. CAUSE: "What do you think is causing your vomiting?"     Unsure 8. HYDRATION STATUS: "Any signs of dehydration?" (e.g., dry mouth [not only dry lips], too weak to stand) "When did you last urinate?"     No 9. OTHER SYMPTOMS: "Do you have any other symptoms?" (e.g., fever, headache, vertigo, vomiting blood or coffee grounds, recent head injury)     Maybe fever 10. PREGNANCY: "Is there any chance you are pregnant?" "When was your last menstrual period?"       N/a  Protocols used: Vomiting-A-AH

## 2021-02-07 ENCOUNTER — Telehealth (INDEPENDENT_AMBULATORY_CARE_PROVIDER_SITE_OTHER): Payer: HMO | Admitting: Family Medicine

## 2021-02-07 ENCOUNTER — Ambulatory Visit: Payer: HMO

## 2021-02-07 DIAGNOSIS — R252 Cramp and spasm: Secondary | ICD-10-CM | POA: Diagnosis not present

## 2021-02-07 DIAGNOSIS — K2970 Gastritis, unspecified, without bleeding: Secondary | ICD-10-CM | POA: Diagnosis not present

## 2021-02-07 NOTE — Progress Notes (Signed)
MyChart Video Visit    Virtual Visit via Video Note   This visit type was conducted due to national recommendations for restrictions regarding the COVID-19 Pandemic (e.g. social distancing) in an effort to limit this patient's exposure and mitigate transmission in our community. This patient is at least at moderate risk for complications without adequate follow up. This format is felt to be most appropriate for this patient at this time. Physical exam was limited by quality of the video and audio technology used for the visit.   Patient location: home Provider location: [facility]  I discussed the limitations of evaluation and management by telemedicine and the availability of in person appointments. The patient expressed understanding and agreed to proceed.  Patient: [name]   DOB: [birth year]   70 y.o. Male  MRN: [id number] Visit Date: 02/07/2021  Today's healthcare provider: Lelon Huh, MD    Subjective    Emesis  This is a new problem. The current episode started yesterday. The problem has been unchanged. Pertinent negatives include no abdominal pain, chest pain, chills or fever.   States has greatly improved since yesterday, although legs have been aching for a couple of weeks. Promethazine helps quite a bit, but hasn't had to take any today.   Medications: Outpatient Medications Prior to Visit  Medication Sig   allopurinol (ZYLOPRIM) 100 MG tablet TAKE 1 TABLET BY MOUTH EVERY DAY   Calcium Citrate 200 MG TABS Take 200 mg by mouth daily. Reported on 11/21/2015   carvedilol (COREG) 3.125 MG tablet TAKE 1 TABLET (3.125 MG TOTAL) BY MOUTH 2 (TWO) TIMES DAILY.   Cholecalciferol (D3-1000 PO) Take 1,000 mg by mouth daily.   cyclobenzaprine (FLEXERIL) 5 MG tablet Take 1 tablet (5 mg total) by mouth 3 (three) times daily as needed for muscle spasms.   furosemide (LASIX) 40 MG tablet TAKE 1 TABLET BY MOUTH TWICE A DAY   insulin aspart (NOVOLOG FLEXPEN) 100 UNIT/ML FlexPen INJECT UP  TO 20 UNITS THREE TIMES A DAY BEFORE MEALS   insulin degludec (TRESIBA FLEXTOUCH) 100 UNIT/ML FlexTouch Pen Inject up to 30 units every day at bedtime (Patient taking differently: Inject 20 Units into the skin every evening.)   Insulin Pen Needle (PEN NEEDLES) 31G X 5 MM MISC 1 Device by Does not apply route 3 (three) times daily.   lisinopril (ZESTRIL) 10 MG tablet Take 1 tablet (10 mg total) by mouth daily. (Patient taking differently: Take 5 mg by mouth daily.)   magnesium gluconate (MAGONATE) 500 MG tablet Take 500 mg by mouth daily.    Multiple Vitamin (MULTIVITAMIN WITH MINERALS) TABS tablet Take 1 tablet by mouth daily.   niacin 500 MG tablet Take 500 mg by mouth at bedtime.   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   promethazine (PHENERGAN) 25 MG tablet Take 1 tablet (25 mg total) by mouth every 8 (eight) hours as needed for nausea or vomiting.   pyridOXINE (VITAMIN B-6) 50 MG tablet Take 50 mg by mouth daily.   sertraline (ZOLOFT) 100 MG tablet TAKE 1 TABLET BY MOUTH EVERY DAY   spironolactone (ALDACTONE) 100 MG tablet Take 1 tablet (100 mg total) by mouth daily.   zinc gluconate 50 MG tablet Take 50 mg by mouth daily.  No facility-administered medications prior to visit.    Review of Systems  Constitutional:  Negative for appetite change, chills and fever.  Respiratory:  Negative for chest tightness, shortness of breath and wheezing.   Cardiovascular:  Negative for chest pain and palpitations.  Gastrointestinal:  Positive for vomiting. Negative for abdominal pain and nausea.     Objective    There were no vitals taken for this visit.   Physical Exam   Awake, alert, oriented x 3. In no apparent distress   Assessment & Plan      1. Gastritis without bleeding, unspecified  chronicity, unspecified gastritis type Greatly improved today. Still has some promethazine leftover if he needs it. Stick to bland diet and aggressive water hydration.   2. Leg cramps Encourage increase water intake, continue daily multivitamins .      I discussed the assessment and treatment plan with the patient. The patient was provided an opportunity to ask questions and all were answered. The patient agreed with the plan and demonstrated an understanding of the instructions.   The patient was advised to call back or seek an in-person evaluation if the symptoms worsen or if the condition fails to improve as anticipated.  Video connection was lost when less than 50% of the duration of the visit was complete, at which time the remainder of the visit was completed via audio only.  I provided 8 minutes of non-face-to-face time during this encounter.  The entirety of the information documented in the History of Present Illness, Review of Systems and Physical Exam were personally obtained by me. Portions of this information were initially documented by the CMA and reviewed by me for thoroughness and accuracy.    Lelon Huh, MD [facility] [phone] (phone) [phone] (fax)  [place]

## 2021-02-21 ENCOUNTER — Ambulatory Visit
Admission: RE | Admit: 2021-02-21 | Discharge: 2021-02-21 | Disposition: A | Discharge status: Home / Self Care | Payer: HMO | Source: Ambulatory Visit | Attending: Family Medicine | Admitting: Family Medicine

## 2021-02-21 ENCOUNTER — Other Ambulatory Visit: Payer: Self-pay

## 2021-02-21 DIAGNOSIS — K7460 Unspecified cirrhosis of liver: Secondary | ICD-10-CM | POA: Diagnosis not present

## 2021-02-21 DIAGNOSIS — K7030 Alcoholic cirrhosis of liver without ascites: Secondary | ICD-10-CM | POA: Insufficient documentation

## 2021-03-03 ENCOUNTER — Other Ambulatory Visit: Payer: Self-pay | Admitting: Family Medicine

## 2021-03-03 DIAGNOSIS — E1165 Type 2 diabetes mellitus with hyperglycemia: Secondary | ICD-10-CM

## 2021-03-03 DIAGNOSIS — Z794 Long term (current) use of insulin: Secondary | ICD-10-CM

## 2021-03-10 ENCOUNTER — Other Ambulatory Visit: Payer: Self-pay

## 2021-03-10 ENCOUNTER — Ambulatory Visit (INDEPENDENT_AMBULATORY_CARE_PROVIDER_SITE_OTHER): Payer: HMO | Admitting: Family Medicine

## 2021-03-10 ENCOUNTER — Encounter: Payer: Self-pay | Admitting: Family Medicine

## 2021-03-10 VITALS — BP 105/68 | HR 60 | Temp 97.5°F | Resp 16 | Wt 154.0 lb

## 2021-03-10 DIAGNOSIS — L03115 Cellulitis of right lower limb: Secondary | ICD-10-CM

## 2021-03-10 DIAGNOSIS — L02415 Cutaneous abscess of right lower limb: Secondary | ICD-10-CM | POA: Diagnosis not present

## 2021-03-10 DIAGNOSIS — F1010 Alcohol abuse, uncomplicated: Secondary | ICD-10-CM | POA: Diagnosis not present

## 2021-03-10 MED ORDER — SILVER SULFADIAZINE 1 % EX CREA: 1.0000 "application " | TOPICAL_CREAM | Freq: Every day | CUTANEOUS | 0 refills | Status: DC

## 2021-03-10 MED ORDER — AMOXICILLIN-POT CLAVULANATE 875-125 MG PO TABS: 1.0000 | ORAL_TABLET | Freq: Two times a day (BID) | ORAL | 0 refills | Status: AC

## 2021-03-10 MED ORDER — NALTREXONE HCL 50 MG PO TABS: 50.0000 mg | ORAL_TABLET | Freq: Every day | ORAL | 1 refills | Status: DC

## 2021-03-10 NOTE — Progress Notes (Signed)
Established patient visit   Patient: [name]   DOB: 1951/06/20   70 y.o. Male  MRN: [id number] Visit Date: 03/10/2021  Today's healthcare provider: Lelon Huh, MD    Subjective  -------------------------------------------------------------------------------------------------------------------- HPI  Wound: Patient is here for evaluation of a wound on the top his right foot. Patient believes his shoe rubbed against the top for his foot causing a break in the skin. He reports having pain, redness and swelling in his foot for the past 2 weeks. He also reports having a wound on the upper right thigh.     Medications: Outpatient Medications Prior to Visit  Medication Sig   allopurinol (ZYLOPRIM) 100 MG tablet TAKE 1 TABLET BY MOUTH EVERY DAY   B-D UF III MINI PEN NEEDLES 31G X 5 MM MISC USE 1 TO CHECK BLOOD SUGAR 3 TIMES DAILY   Calcium Citrate 200 MG TABS Take 200 mg by mouth daily. Reported on 11/21/2015   carvedilol (COREG) 3.125 MG tablet TAKE 1 TABLET (3.125 MG TOTAL) BY MOUTH 2 (TWO) TIMES DAILY.   Cholecalciferol (D3-1000 PO) Take 1,000 mg by mouth daily.   cyclobenzaprine (FLEXERIL) 5 MG tablet Take 1 tablet (5 mg total) by mouth 3 (three) times daily as needed for muscle spasms.   furosemide (LASIX) 40 MG tablet TAKE 1 TABLET BY MOUTH TWICE A DAY   insulin aspart (NOVOLOG FLEXPEN) 100 UNIT/ML FlexPen INJECT UP TO 20 UNITS THREE TIMES A DAY BEFORE MEALS   insulin degludec (TRESIBA FLEXTOUCH) 100 UNIT/ML FlexTouch Pen Inject up to 30 units every day at bedtime (Patient taking differently: Inject 20 Units into the skin every evening.)   lisinopril (ZESTRIL) 10 MG tablet Take 1 tablet (10 mg total) by mouth daily. (Patient taking differently: Take 5 mg by mouth daily.)   magnesium gluconate (MAGONATE) 500 MG tablet Take 500 mg by mouth daily.    Multiple Vitamin (MULTIVITAMIN WITH MINERALS) TABS tablet Take 1 tablet by mouth daily.   niacin 500 MG tablet Take 500 mg by  mouth at bedtime.   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   promethazine (PHENERGAN) 25 MG tablet Take 1 tablet (25 mg total) by mouth every 8 (eight) hours as needed for nausea or vomiting.   pyridOXINE (VITAMIN B-6) 50 MG tablet Take 50 mg by mouth daily.   sertraline (ZOLOFT) 100 MG tablet TAKE 1 TABLET BY MOUTH EVERY DAY   spironolactone (ALDACTONE) 100 MG tablet Take 1 tablet (100 mg total) by mouth daily.   zinc gluconate 50 MG tablet Take 50 mg by mouth daily.   No facility-administered medications prior to visit.    Review of Systems  Constitutional:  Negative for appetite change, chills and fever.  Respiratory:  Negative for chest tightness, shortness of breath and wheezing.   Cardiovascular:  Negative for chest pain and palpitations.  Gastrointestinal:  Negative for abdominal pain, nausea and vomiting.  Skin:  Positive for wound (right thigh and right foot).      Objective  -------------------------------------------------------------------------------------------------------------------- BP 105/68 (BP Location: Left Arm, Patient Position: Sitting, Cuff Size: Normal)   Pulse 60   Temp (!) 97.5 F (36.4 C) (Temporal)   Resp 16   Wt 154 lb (69.9 kg)   BMI 25.63 kg/m  Physical Exam     Right medial thigh with about 10m erosion draining small amount of yellow discharge with tender induration under wound   Assessment & Plan  ---------------------------------------------------------------------------------------------------------------------- 1. Abscess of right thigh  - silver sulfADIAZINE (SILVADENE) 1 % cream; Apply 1 application topically daily.  Dispense: 50 g; Refill: 0 - amoxicillin-clavulanate (AUGMENTIN) 875-125 MG tablet; Take 1 tablet by mouth 2 (two) times  daily for 10 days.  Dispense: 20 tablet; Refill: 0 - Aerobic culture  Call if symptoms change or if not rapidly improving.    2. Cellulitis of right foot - silver sulfADIAZINE (SILVADENE) 1 % cream; Apply 1 application topically daily.  Dispense: 50 g; Refill: 0  - amoxicillin-clavulanate (AUGMENTIN) 875-125 MG tablet; Take 1 tablet by mouth 2 (two) times daily for 10 days.  Dispense: 20 tablet; Refill: 0  3. Alcohol abuse He states he has been craving alcohol again after long period of abstinence. He would like to start back on something to reduce cravings.  - naltrexone (DEPADE) 50 MG tablet; Take 1 tablet (50 mg total) by mouth daily.  Dispense: 90 tablet; Refill: 1    He has also been a little light headed for the last week or so and is noted by be a bit hypotensive today. He is to reduce spironolactone to 1/2 tablet daily so long as he does not develop any abdominal swelling.      The entirety of the information documented in the History of Present Illness, Review of Systems and Physical Exam were personally obtained by me. Portions of this information were initially documented by the CMA and reviewed by me for thoroughness and accuracy.     DLelon Huh MD  [phone](phone) [phone](fax)  [place]

## 2021-03-15 ENCOUNTER — Other Ambulatory Visit: Payer: Self-pay | Admitting: Family Medicine

## 2021-03-20 LAB — AEROBIC CULTURE

## 2021-03-27 ENCOUNTER — Telehealth: Payer: Self-pay | Admitting: Gastroenterology

## 2021-03-27 ENCOUNTER — Other Ambulatory Visit: Payer: Self-pay

## 2021-03-27 ENCOUNTER — Other Ambulatory Visit: Payer: Self-pay | Admitting: Gastroenterology

## 2021-03-27 MED ORDER — FUROSEMIDE 40 MG PO TABS: 40.0000 mg | ORAL_TABLET | Freq: Two times a day (BID) | ORAL | 1 refills | Status: DC

## 2021-03-27 NOTE — Telephone Encounter (Signed)
Pt. Requesting a call back. He says he has a prescription that he can not have filled without the Dr. Lynnell Jude permission.

## 2021-03-27 NOTE — Telephone Encounter (Signed)
Refill for Furosemide 40mg  has been sent to pt's pharmacy per his request.

## 2021-04-02 ENCOUNTER — Other Ambulatory Visit: Payer: Self-pay | Admitting: Family Medicine

## 2021-04-17 ENCOUNTER — Other Ambulatory Visit: Payer: Self-pay | Admitting: Family Medicine

## 2021-04-17 DIAGNOSIS — E1165 Type 2 diabetes mellitus with hyperglycemia: Secondary | ICD-10-CM

## 2021-04-17 MED ORDER — NOVOLOG FLEXPEN 100 UNIT/ML ~~LOC~~ SOPN: PEN_INJECTOR | SUBCUTANEOUS | 0 refills | Status: DC

## 2021-04-17 NOTE — Telephone Encounter (Signed)
Requested Prescriptions  Pending Prescriptions Disp Refills  . insulin aspart (NOVOLOG FLEXPEN) 100 UNIT/ML FlexPen 15 mL 0    Sig: INJECT UP TO 20 UNITS THREE TIMES A DAY BEFORE MEALS     Endocrinology:  Diabetes - Insulins Passed - 04/17/2021  7:07 PM      Passed - HBA1C is between 0 and 7.9 and within 180 days    Hemoglobin A1C  Date Value Ref Range Status  01/17/2021 6.4 (A) 4.0 - 5.6 % Final  09/07/2012 11.3 (H) 4.2 - 6.3 % Final    Comment:    The American Diabetes Association recommends that a primary goal of therapy should be <7% and that physicians should reevaluate the treatment regimen in patients with HbA1c values consistently >8%.    Hgb A1c MFr Bld  Date Value Ref Range Status  10/03/2017 8.9 (H) 4.8 - 5.6 % Final    Comment:             Prediabetes: 5.7 - 6.4          Diabetes: >6.4          Glycemic control for adults with diabetes: <7.0          Passed - Valid encounter within last 6 months    Recent Outpatient Visits          1 month ago Abscess of right thigh   [facility] Birdie Sons, MD   2 months ago Gastritis without bleeding, unspecified chronicity, unspecified gastritis type   [facility] Birdie Sons, MD   3 months ago Type 2 diabetes mellitus with hyperglycemia, with long-term current use of insulin [facility])   [facility] Birdie Sons, MD   7 months ago Type 2 diabetes mellitus with hyperglycemia, with long-term current use of insulin [facility])   [facility] Birdie Sons, MD   11 months ago Type 2 diabetes mellitus with hyperglycemia, with long-term current use of insulin [facility])   [facility] Caryn Section, [name], MD

## 2021-04-17 NOTE — Telephone Encounter (Signed)
PATIENT ALSO NEEDS short supply because he is out of town and left it home, sent to [facility], [address], [place], [place] [address]. Asking to fx to  [phone] and phone # [phone]. Medication Refill - Medication: insulin aspart (NOVOLOG FLEXPEN) 100 UNIT/ML FlexPen [[id number]]   Has the patient contacted their pharmacy? yes (Agent: If no, request that the patient contact the pharmacy for the refill.) (Agent: If yes, when and what did the pharmacy advise?)contact pcp  Preferred Pharmacy (with phone number or street name): [id number] [place], [place]  Phone:  [phone] Fax:  [phone]  Has the patient been seen for an appointment in the last year OR does the patient have an upcoming appointment? yes  Agent: Please be advised that RX refills may take up to 3 business days. We ask that you follow-up with your pharmacy.

## 2021-04-19 ENCOUNTER — Ambulatory Visit: Payer: HMO | Admitting: Family Medicine

## 2021-04-24 ENCOUNTER — Other Ambulatory Visit: Payer: Self-pay | Admitting: Family Medicine

## 2021-04-27 ENCOUNTER — Other Ambulatory Visit: Payer: Self-pay | Admitting: Gastroenterology

## 2021-04-27 ENCOUNTER — Other Ambulatory Visit: Payer: Self-pay | Admitting: Family Medicine

## 2021-04-27 DIAGNOSIS — Z794 Long term (current) use of insulin: Secondary | ICD-10-CM

## 2021-04-27 DIAGNOSIS — E1165 Type 2 diabetes mellitus with hyperglycemia: Secondary | ICD-10-CM

## 2021-05-11 DIAGNOSIS — M25571 Pain in right ankle and joints of right foot: Secondary | ICD-10-CM | POA: Diagnosis not present

## 2021-05-11 DIAGNOSIS — M25579 Pain in unspecified ankle and joints of unspecified foot: Secondary | ICD-10-CM | POA: Insufficient documentation

## 2021-05-11 DIAGNOSIS — S93401A Sprain of unspecified ligament of right ankle, initial encounter: Secondary | ICD-10-CM | POA: Insufficient documentation

## 2021-05-11 DIAGNOSIS — M19079 Primary osteoarthritis, unspecified ankle and foot: Secondary | ICD-10-CM | POA: Insufficient documentation

## 2021-05-11 DIAGNOSIS — S92009A Unspecified fracture of unspecified calcaneus, initial encounter for closed fracture: Secondary | ICD-10-CM | POA: Insufficient documentation

## 2021-05-11 DIAGNOSIS — S92001A Unspecified fracture of right calcaneus, initial encounter for closed fracture: Secondary | ICD-10-CM | POA: Diagnosis not present

## 2021-05-11 DIAGNOSIS — M19071 Primary osteoarthritis, right ankle and foot: Secondary | ICD-10-CM | POA: Diagnosis not present

## 2021-05-22 ENCOUNTER — Other Ambulatory Visit (HOSPITAL_COMMUNITY): Payer: Self-pay | Admitting: Orthopaedic Surgery

## 2021-05-22 ENCOUNTER — Other Ambulatory Visit: Payer: Self-pay | Admitting: Orthopaedic Surgery

## 2021-05-22 DIAGNOSIS — S92001A Unspecified fracture of right calcaneus, initial encounter for closed fracture: Secondary | ICD-10-CM

## 2021-05-22 NOTE — Progress Notes (Deleted)
Annual Wellness Visit     Patient: [name], Male    DOB: [birth year], 70 y.o.   MRN: [id number] Visit Date: 05/23/2021  Today's Provider: Lelon Huh, MD   No chief complaint on file.  Subjective    [name] is a 69 y.o. male who presents today for his Annual Wellness Visit. He reports consuming a {diet types:17450} diet. {Exercise:19826} He generally feels {well/fairly well/poorly:18703}. He reports sleeping {well/fairly well/poorly:18703}. He {does/does not:200015} have additional problems to discuss today.   HPI Diabetes Mellitus Type II, Follow-up  Lab Results  Component Value Date   HGBA1C 6.4 (A) 01/17/2021   HGBA1C 8.0 (A) 09/14/2020   HGBA1C 9.6 (A) 04/29/2020   Wt Readings from Last 3 Encounters:  03/10/21 154 lb (69.9 kg)  01/17/21 148 lb (67.1 kg)  09/14/20 163 lb 12.8 oz (74.3 kg)   Last seen for diabetes 4 months ago.  Management since then includes reducing Tresiba from 20 to 18 units at night. Continue SSI. He reports {excellent/good/fair/poor:19665} compliance with treatment. He {is/is not:21021397} having side effects. {document side effects if present:1} Symptoms: {Yes/No:20286} fatigue {Yes/No:20286} foot ulcerations  {Yes/No:20286} appetite changes {Yes/No:20286} nausea  {Yes/No:20286} paresthesia of the feet  {Yes/No:20286} polydipsia  {Yes/No:20286} polyuria {Yes/No:20286} visual disturbances   {Yes/No:20286} vomiting     Home blood sugar records: {diabetes glucometry results:16657}  Episodes of hypoglycemia? {Yes/No:20286} {enter symptoms and frequency of symptoms if yes:1}   Current insulin regiment: {enter 'none' or type of insulin and number of units taken with each dose of each insulin formulation that the patient is taking:1} Most Recent Eye Exam: >1 year ago {Current exercise:16438:::1} {Current diet habits:16563:::1}  Pertinent Labs: Lab Results  Component Value Date   CHOL 112 09/20/2020   HDL 37 (L) 09/20/2020    LDLCALC 62 09/20/2020   TRIG 60 09/20/2020   CHOLHDL 3.0 09/20/2020   Lab Results  Component Value Date   NA 136 09/20/2020   K 4.8 09/20/2020   CREATININE 0.71 (L) 09/20/2020   EGFR 99 09/20/2020     ---------------------------------------------------------------------------------------------------   Follow up for Vitamin D Deficiency:  The patient was last seen for this 4  months  ago. Changes made at last visit include none; continue vitamin D supplementation.  He reports {excellent/good/fair/poor:19665} compliance with treatment. He feels that condition is {improved/worse/unchanged:3041574}. He {is/is not:21021397} having side effects. ***  -----------------------------------------------------------------------------------------   Hypertension, follow-up  BP Readings from Last 3 Encounters:  03/10/21 105/68  01/17/21 (!) 145/94  10/25/20 112/74   Wt Readings from Last 3 Encounters:  03/10/21 154 lb (69.9 kg)  01/17/21 148 lb (67.1 kg)  09/14/20 163 lb 12.8 oz (74.3 kg)     He was last seen for hypertension 8 months ago.  BP at that visit was 125/87. Management since that visit includes continue same medications.  He reports {excellent/good/fair/poor:19665} compliance with treatment. He {is/is not:9024} having side effects. {document side effects if present:1} He is following a {diet:21022986} diet. He {is/is not:9024} exercising. He {does/does not:200015} smoke.  Use of agents associated with hypertension: none.   Outside blood pressures are {***enter patient reported home BP readings, or 'not being checked':1}. Symptoms: {Yes/No:20286} chest pain {Yes/No:20286} chest pressure  {Yes/No:20286} palpitations {Yes/No:20286} syncope  {Yes/No:20286} dyspnea {Yes/No:20286} orthopnea  {Yes/No:20286} paroxysmal nocturnal dyspnea {Yes/No:20286} lower extremity edema   Pertinent labs: Lab Results  Component Value Date   CHOL 112 09/20/2020   HDL 37 (L) 09/20/2020    LDLCALC 62 09/20/2020  TRIG 60 09/20/2020   CHOLHDL 3.0 09/20/2020   Lab Results  Component Value Date   NA 136 09/20/2020   K 4.8 09/20/2020   CREATININE 0.71 (L) 09/20/2020   EGFR 99 09/20/2020   GLUCOSE 192 (H) 09/20/2020   TSH 1.99 11/02/2014     The ASCVD Risk score (Arnett DK, et al., 2019) failed to calculate for the following reasons:   The valid total cholesterol range is 130 to 320 mg/dL   ---------------------------------------------------------------------------------------------------    Medications: Outpatient Medications Prior to Visit  Medication Sig   allopurinol (ZYLOPRIM) 100 MG tablet TAKE 1 TABLET BY MOUTH EVERY DAY   B-D UF III MINI PEN NEEDLES 31G X 5 MM MISC USE 1 TO CHECK BLOOD SUGAR 3 TIMES DAILY   Calcium Citrate 200 MG TABS Take 200 mg by mouth daily. Reported on 11/21/2015   carvedilol (COREG) 3.125 MG tablet TAKE 1 TABLET (3.125 MG TOTAL) BY MOUTH 2 (TWO) TIMES DAILY.   Cholecalciferol (D3-1000 PO) Take 1,000 mg by mouth daily.   cyclobenzaprine (FLEXERIL) 5 MG tablet Take 1 tablet (5 mg total) by mouth 3 (three) times daily as needed for muscle spasms.   furosemide (LASIX) 40 MG tablet Take 1 tablet (40 mg total) by mouth 2 (two) times daily.   Insulin Aspart FlexPen (NOVOLOG) 100 UNIT/ML INJECT UP TO 20 UNITS THREE TIMES A DAY BEFORE MEALS   insulin degludec (TRESIBA FLEXTOUCH) 100 UNIT/ML FlexTouch Pen Inject up to 30 units every day at bedtime (Patient taking differently: Inject 20 Units into the skin every evening.)   lisinopril (ZESTRIL) 10 MG tablet Take 0.5 tablets (5 mg total) by mouth daily.   magnesium gluconate (MAGONATE) 500 MG tablet Take 500 mg by mouth daily.    Multiple Vitamin (MULTIVITAMIN WITH MINERALS) TABS tablet Take 1 tablet by mouth daily.   naltrexone (DEPADE) 50 MG tablet Take 1 tablet (50 mg total) by mouth daily.   niacin 500 MG tablet Take 500 mg by mouth at bedtime.   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   promethazine (PHENERGAN) 25 MG tablet Take 1 tablet (25 mg total) by mouth every 8 (eight) hours as needed for nausea or vomiting.   pyridOXINE (VITAMIN B-6) 50 MG tablet Take 50 mg by mouth daily.   sertraline (ZOLOFT) 100 MG tablet TAKE 1 TABLET BY MOUTH EVERY DAY   silver sulfADIAZINE (SILVADENE) 1 % cream Apply 1 application topically daily.   spironolactone (ALDACTONE) 100 MG tablet TAKE 1 TABLET BY MOUTH EVERY DAY   zinc gluconate 50 MG tablet Take 50 mg by mouth daily.   No facility-administered medications prior to visit.    Allergies  Allergen Reactions   Hydrochlorothiazide Other (See Comments)    Reaction:  Hyponatremia    Patient Care Team: Birdie Sons, MD as PCP - General (Family Medicine) Lucilla Lame, MD as Consulting Physician (Gastroenterology) Grant Fontana, DC as Referring Physician (Chiropractic Medicine) Pcp, No  Review of Systems  {Labs  Heme  Chem  Endocrine  Serology  Results Review (optional):23779}    Objective    Vitals: There were no vitals taken for this visit. {Show previous vital signs (optional):23777}  Physical Exam ***  Most recent functional status assessment: In your present state of health,  do you have any difficulty performing the following activities: 09/14/2020  Hearing? N  Vision? N  Difficulty concentrating or making decisions? N  Walking or climbing stairs? N  Dressing or bathing? N  Doing errands, shopping? N  Some recent data might be hidden   Most recent fall risk assessment: Fall Risk  09/14/2020  Falls in the past year? 0  Number falls in past yr: 0  Injury with Fall? 0  Risk for fall due to : -  Follow up -    Most recent depression screenings: PHQ 2/9 Scores 09/14/2020 04/29/2020  PHQ - 2 Score 0 0  PHQ- 9 Score 1 1    Most recent cognitive screening: 6CIT Screen 05/18/2020  What Year? 0 points  What month? 0 points  What time? 0 points  Count back from 20 0 points  Months in reverse 0 points  Repeat phrase 0 points  Total Score 0   Most recent Audit-C alcohol use screening Alcohol Use Disorder Test (AUDIT) 09/14/2020  1. How often do you have a drink containing alcohol? 0  2. How many drinks containing alcohol do you have on a typical day when you are drinking? 0  3. How often do you have six or more drinks on one occasion? 0  AUDIT-C Score 0  Alcohol Brief Interventions/Follow-up AUDIT Score <7 follow-up not indicated   A score of 3 or more in women, and 4 or more in men indicates increased risk for alcohol abuse, EXCEPT if all of the points are from question 1   No results found for any visits on 05/23/21.  Assessment & Plan     Annual wellness visit done today including the all of the following: Reviewed patient's Family Medical History Reviewed and updated list of patient's medical providers Assessment of cognitive impairment was done Assessed patient's functional ability Established a written schedule for health screening services Health Risk Assessent Completed and Reviewed  Exercise Activities and Dietary recommendations  Goals      DIET - INCREASE WATER INTAKE     Recommend to drink at least 6-8 8oz glasses of water per day.        Immunization History  Administered Date(s) Administered   Influenza Split 05/12/2007, 05/24/2009, 06/05/2011, 05/12/2012   Influenza, High Dose Seasonal PF 04/03/2017, 04/14/2018, 04/03/2021   Influenza,inj,Quad PF,6+ Mos 05/26/2013, 05/20/2014, 04/11/2015   Influenza-Unspecified 03/09/2017, 05/01/2019, 04/22/2020   Moderna Sars-Covid-2 Vaccination 10/26/2019, 11/23/2019   Pneumococcal Conjugate-13 10/02/2016, 04/14/2018   Pneumococcal Polysaccharide-23 08/06/2011, 10/03/2017   Td 07/09/2006   Tdap 06/05/2011, 09/15/2020   Zoster Recombinat  (Shingrix) 09/15/2020   Zoster, Live 01/04/2011    Health Maintenance  Topic Date Due   FOOT EXAM  Never done   COVID-19 Vaccine (3 - Booster for Moderna series) 01/18/2020   OPHTHALMOLOGY EXAM  09/01/2020   Zoster Vaccines- Shingrix (2 of 2) 11/10/2020   COLONOSCOPY (Pts 45-80yrs Insurance coverage will need to be confirmed)  11/20/2020   HEMOGLOBIN A1C  07/20/2021   TETANUS/TDAP  09/16/2030   Pneumonia Vaccine 7+ Years old  Completed   INFLUENZA VACCINE  Completed   Hepatitis C Screening  Completed   HPV VACCINES  Aged Out     Discussed health benefits of physical activity, and encouraged him to engage in regular exercise appropriate for his age and condition.    ***  No follow-ups on file.     {provider attestation***:1}   Mila Merry, MD  [facility] [phone] (phone) [phone] (fax)  [place]

## 2021-05-23 ENCOUNTER — Encounter: Payer: HMO | Admitting: Family Medicine

## 2021-05-23 ENCOUNTER — Ambulatory Visit: Payer: HMO

## 2021-05-24 ENCOUNTER — Ambulatory Visit: Admission: RE | Admit: 2021-05-24 | Payer: HMO | Source: Ambulatory Visit

## 2021-05-24 ENCOUNTER — Ambulatory Visit: Payer: Medicare HMO

## 2021-05-30 ENCOUNTER — Encounter: Payer: Self-pay | Admitting: Family Medicine

## 2021-05-30 DIAGNOSIS — E1165 Type 2 diabetes mellitus with hyperglycemia: Secondary | ICD-10-CM

## 2021-05-30 DIAGNOSIS — Z794 Long term (current) use of insulin: Secondary | ICD-10-CM

## 2021-06-02 ENCOUNTER — Ambulatory Visit
Admission: RE | Admit: 2021-06-02 | Discharge: 2021-06-02 | Disposition: A | Discharge status: Home / Self Care | Payer: HMO | Source: Ambulatory Visit | Attending: Orthopaedic Surgery | Admitting: Orthopaedic Surgery

## 2021-06-02 ENCOUNTER — Other Ambulatory Visit: Payer: Self-pay

## 2021-06-02 DIAGNOSIS — S92001A Unspecified fracture of right calcaneus, initial encounter for closed fracture: Secondary | ICD-10-CM | POA: Insufficient documentation

## 2021-06-02 MED ORDER — NOVOLOG FLEXPEN 100 UNIT/ML ~~LOC~~ SOPN: PEN_INJECTOR | SUBCUTANEOUS | 4 refills | Status: DC

## 2021-06-12 ENCOUNTER — Encounter: Payer: Self-pay | Admitting: Physical Medicine and Rehabilitation

## 2021-06-15 ENCOUNTER — Other Ambulatory Visit: Payer: Self-pay | Admitting: Family Medicine

## 2021-06-15 ENCOUNTER — Encounter: Payer: Self-pay | Admitting: Physical Medicine and Rehabilitation

## 2021-06-15 DIAGNOSIS — Z794 Long term (current) use of insulin: Secondary | ICD-10-CM

## 2021-06-15 DIAGNOSIS — E1165 Type 2 diabetes mellitus with hyperglycemia: Secondary | ICD-10-CM

## 2021-06-27 ENCOUNTER — Other Ambulatory Visit: Payer: Self-pay | Admitting: Family Medicine

## 2021-06-27 DIAGNOSIS — Z794 Long term (current) use of insulin: Secondary | ICD-10-CM

## 2021-06-29 ENCOUNTER — Other Ambulatory Visit: Payer: Self-pay | Admitting: Gastroenterology

## 2021-06-29 ENCOUNTER — Other Ambulatory Visit: Payer: Self-pay | Admitting: Family Medicine

## 2021-06-29 DIAGNOSIS — E1165 Type 2 diabetes mellitus with hyperglycemia: Secondary | ICD-10-CM

## 2021-06-29 NOTE — Telephone Encounter (Signed)
insulin degludec (TRESIBA FLEXTOUCH) 100 UNIT/ML FlexTouch Pen 15 mL 0 06/27/2021    Sig: INJECT UP TO 20 UNITS UNDER THE SKIN EVERY DAY AT BEDTIME NEED INSURANCE CARD   Sent to pharmacy as: insulin degludec (TRESIBA FLEXTOUCH) 100 UNIT/ML FlexTouch Pen   Notes to Pharmacy: PATIENT NEEDS TO SCHEDULE OFFICE VISIT FOR FOLLOW UP   E-Prescribing Status: Receipt confirmed by pharmacy (06/27/2021  6:33 AM EST)

## 2021-06-29 NOTE — Telephone Encounter (Signed)
Requested medication (s) are due for refill today: no  Requested medication (s) are on the active medication list: yes  Last refill: 06/27/21  Future visit scheduled: no Notes to clinic: Patient is due for follow up visit for further refills.       Requested Prescriptions  Pending Prescriptions Disp Refills   TRESIBA FLEXTOUCH 100 UNIT/ML FlexTouch Pen [Pharmacy Med Name: TRESIBA FLEXTOUCH 100 UNIT/ML]  3    Sig: INJECT UP TO 20 UNITS UNDER THE SKIN EVERY DAY AT BEDTIME NEED INSURANCE CARD     Endocrinology:  Diabetes - Insulins Passed - 06/29/2021 11:15 AM      Passed - HBA1C is between 0 and 7.9 and within 180 days    Hemoglobin A1C  Date Value Ref Range Status  01/17/2021 6.4 (A) 4.0 - 5.6 % Final  09/07/2012 11.3 (H) 4.2 - 6.3 % Final    Comment:    The American Diabetes Association recommends that a primary goal of therapy should be <7% and that physicians should reevaluate the treatment regimen in patients with HbA1c values consistently >8%.    Hgb A1c MFr Bld  Date Value Ref Range Status  10/03/2017 8.9 (H) 4.8 - 5.6 % Final    Comment:             Prediabetes: 5.7 - 6.4          Diabetes: >6.4          Glycemic control for adults with diabetes: <7.0           Passed - Valid encounter within last 6 months    Recent Outpatient Visits           3 months ago Abscess of right thigh   [facility] Birdie Sons, MD   4 months ago Gastritis without bleeding, unspecified chronicity, unspecified gastritis type   [facility] Birdie Sons, MD   5 months ago Type 2 diabetes mellitus with hyperglycemia, with long-term current use of insulin [facility])   [facility] Birdie Sons, MD   9 months ago Type 2 diabetes mellitus with hyperglycemia, with long-term current use of insulin [facility])   [facility] Birdie Sons, MD   1 year ago Type 2 diabetes mellitus with hyperglycemia, with long-term current use  of insulin [facility])   [facility] Birdie Sons, MD

## 2021-07-04 ENCOUNTER — Telehealth: Payer: Self-pay

## 2021-07-04 NOTE — Telephone Encounter (Signed)
[name], Case manager at [facility] calling and asking if pt can get prescription for Freestyle Libre d/t check BS 3-4x a day and fingertips numb and sore. I advised I will send to provider and see if prescription can be sent in. She gave number in case we needed to reach out. This will be covered by insurance for pt.

## 2021-07-04 NOTE — Telephone Encounter (Signed)
Please advise 

## 2021-07-05 MED ORDER — FREESTYLE LIBRE 14 DAY SENSOR MISC: 1.0000 | 12 refills | Status: DC

## 2021-07-05 MED ORDER — FREESTYLE LIBRE 14 DAY READER DEVI: 1.0000 | 12 refills | Status: AC

## 2021-07-05 NOTE — Addendum Note (Signed)
Addended by: Birdie Sons on: 07/05/2021 07:53 AM   Modules accepted: Orders

## 2021-07-05 NOTE — Telephone Encounter (Signed)
Patient advised.

## 2021-07-05 NOTE — Telephone Encounter (Signed)
Please advise patient have sent prescription for freestyle libre to [facility] in [place]

## 2021-07-31 ENCOUNTER — Other Ambulatory Visit: Payer: Self-pay | Admitting: Family Medicine

## 2021-07-31 DIAGNOSIS — E1165 Type 2 diabetes mellitus with hyperglycemia: Secondary | ICD-10-CM

## 2021-08-03 DIAGNOSIS — H35362 Drusen (degenerative) of macula, left eye: Secondary | ICD-10-CM | POA: Diagnosis not present

## 2021-08-03 LAB — HM DIABETES EYE EXAM

## 2021-08-07 DIAGNOSIS — E1136 Type 2 diabetes mellitus with diabetic cataract: Secondary | ICD-10-CM | POA: Diagnosis not present

## 2021-08-07 DIAGNOSIS — E1169 Type 2 diabetes mellitus with other specified complication: Secondary | ICD-10-CM | POA: Diagnosis not present

## 2021-08-07 DIAGNOSIS — Z794 Long term (current) use of insulin: Secondary | ICD-10-CM | POA: Diagnosis not present

## 2021-08-07 DIAGNOSIS — D8481 Immunodeficiency due to conditions classified elsewhere: Secondary | ICD-10-CM | POA: Diagnosis not present

## 2021-08-07 DIAGNOSIS — F1021 Alcohol dependence, in remission: Secondary | ICD-10-CM | POA: Diagnosis not present

## 2021-08-07 DIAGNOSIS — K7031 Alcoholic cirrhosis of liver with ascites: Secondary | ICD-10-CM | POA: Diagnosis not present

## 2021-08-07 DIAGNOSIS — F3341 Major depressive disorder, recurrent, in partial remission: Secondary | ICD-10-CM | POA: Diagnosis not present

## 2021-08-07 DIAGNOSIS — G8929 Other chronic pain: Secondary | ICD-10-CM | POA: Diagnosis not present

## 2021-08-07 DIAGNOSIS — I10 Essential (primary) hypertension: Secondary | ICD-10-CM | POA: Diagnosis not present

## 2021-08-07 DIAGNOSIS — E1165 Type 2 diabetes mellitus with hyperglycemia: Secondary | ICD-10-CM | POA: Diagnosis not present

## 2021-08-07 DIAGNOSIS — E785 Hyperlipidemia, unspecified: Secondary | ICD-10-CM | POA: Diagnosis not present

## 2021-08-07 DIAGNOSIS — F411 Generalized anxiety disorder: Secondary | ICD-10-CM | POA: Diagnosis not present

## 2021-08-14 DIAGNOSIS — L218 Other seborrheic dermatitis: Secondary | ICD-10-CM | POA: Diagnosis not present

## 2021-08-14 DIAGNOSIS — X32XXXA Exposure to sunlight, initial encounter: Secondary | ICD-10-CM | POA: Diagnosis not present

## 2021-08-14 DIAGNOSIS — L570 Actinic keratosis: Secondary | ICD-10-CM | POA: Diagnosis not present

## 2021-08-15 ENCOUNTER — Encounter: Payer: Self-pay | Admitting: Family Medicine

## 2021-08-15 ENCOUNTER — Ambulatory Visit (INDEPENDENT_AMBULATORY_CARE_PROVIDER_SITE_OTHER): Payer: HMO | Admitting: Family Medicine

## 2021-08-15 ENCOUNTER — Other Ambulatory Visit: Payer: Self-pay

## 2021-08-15 VITALS — BP 102/71 | HR 69 | Temp 98.8°F | Resp 14 | Wt 158.0 lb

## 2021-08-15 DIAGNOSIS — Z794 Long term (current) use of insulin: Secondary | ICD-10-CM

## 2021-08-15 DIAGNOSIS — E1165 Type 2 diabetes mellitus with hyperglycemia: Secondary | ICD-10-CM | POA: Diagnosis not present

## 2021-08-15 DIAGNOSIS — I10 Essential (primary) hypertension: Secondary | ICD-10-CM

## 2021-08-15 DIAGNOSIS — M479 Spondylosis, unspecified: Secondary | ICD-10-CM | POA: Diagnosis not present

## 2021-08-15 DIAGNOSIS — F1021 Alcohol dependence, in remission: Secondary | ICD-10-CM | POA: Diagnosis not present

## 2021-08-15 DIAGNOSIS — M4722 Other spondylosis with radiculopathy, cervical region: Secondary | ICD-10-CM

## 2021-08-15 DIAGNOSIS — E881 Lipodystrophy, not elsewhere classified: Secondary | ICD-10-CM

## 2021-08-15 DIAGNOSIS — Z289 Immunization not carried out for unspecified reason: Secondary | ICD-10-CM

## 2021-08-15 LAB — POCT GLYCOSYLATED HEMOGLOBIN (HGB A1C)
Est. average glucose Bld gHb Est-mCnc: 180
Hemoglobin A1C: 7.9 % — AB (ref 4.0–5.6)

## 2021-08-15 MED ORDER — TRAMADOL HCL 50 MG PO TABS: 50.0000 mg | ORAL_TABLET | Freq: Three times a day (TID) | ORAL | 1 refills | Status: DC | PRN

## 2021-08-15 MED ORDER — SHINGRIX 50 MCG/0.5ML IM SUSR: 0.5000 mL | Freq: Once | INTRAMUSCULAR | 0 refills | Status: AC

## 2021-08-15 NOTE — Progress Notes (Signed)
I,Roshena L Chambers,acting as a scribe for Lelon Huh, MD.,have documented all relevant documentation on the behalf of Lelon Huh, MD,as directed by  Lelon Huh, MD while in the presence of Lelon Huh, MD.    Established patient visit   Patient: [name]   DOB: 08/25/1950   71 y.o. Male  MRN: [id number] Visit Date: 08/15/2021  Today's healthcare provider: Lelon Huh, MD   Chief Complaint  Patient presents with   Diabetes   Hypertension   Subjective    HPI  Diabetes Mellitus Type II, Follow-up  Lab Results  Component Value Date   HGBA1C 6.4 (A) 01/17/2021   HGBA1C 8.0 (A) 09/14/2020   HGBA1C 9.6 (A) 04/29/2020   Wt Readings from Last 3 Encounters:  08/15/21 158 lb (71.7 kg)  03/10/21 154 lb (69.9 kg)  01/17/21 148 lb (67.1 kg)   Last seen for diabetes 6 months ago.  Management since then includes reducing Tresiba from 20 to 18 units at night. Continue SSI. He reports fair compliance with treatment. He is not having side effects.  Symptoms: No fatigue No foot ulcerations  No appetite changes No nausea  No paresthesia of the feet  No polydipsia  No polyuria No visual disturbances   No vomiting     Home blood sugar records: fasting range: 105-250  Episodes of hypoglycemia? No    Current insulin regiment: Tresiba 20-30 units at bedtime and Novolog sliding scale Most Recent Eye Exam: 07/2021 Current exercise: none Current diet habits: well balanced  Pertinent Labs: Lab Results  Component Value Date   CHOL 112 09/20/2020   HDL 37 (L) 09/20/2020   LDLCALC 62 09/20/2020   TRIG 60 09/20/2020   CHOLHDL 3.0 09/20/2020   Lab Results  Component Value Date   NA 136 09/20/2020   K 4.8 09/20/2020   CREATININE 0.71 (L) 09/20/2020   EGFR 99 09/20/2020     ---------------------------------------------------------------------------------------------------   Hypertension, follow-up  BP Readings from Last 3 Encounters:  08/15/21 102/71   03/10/21 105/68  01/17/21 (!) 145/94   Wt Readings from Last 3 Encounters:  08/15/21 158 lb (71.7 kg)  03/10/21 154 lb (69.9 kg)  01/17/21 148 lb (67.1 kg)     He was last seen for hypertension 10 months ago.  BP at that visit was 125/87. Management since that visit includes continuing same medications.  He reports good compliance with treatment. He is not having side effects.  He is following a Regular diet. He is not exercising. He does not smoke.  Use of agents associated with hypertension: none.   Outside blood pressures are checked occasionally. Symptoms: No chest pain No chest pressure  No palpitations No syncope  No dyspnea No orthopnea  No paroxysmal nocturnal dyspnea No lower extremity edema   Pertinent labs: Lab Results  Component Value Date   CHOL 112 09/20/2020   HDL 37 (L) 09/20/2020   LDLCALC 62 09/20/2020   TRIG 60 09/20/2020   CHOLHDL 3.0 09/20/2020   Lab Results  Component Value Date   NA 136 09/20/2020   K 4.8 09/20/2020   CREATININE 0.71 (L) 09/20/2020   EGFR 99 09/20/2020   GLUCOSE 192 (H) 09/20/2020   TSH 1.99 11/02/2014     The ASCVD Risk score (Arnett DK, et al., 2019) failed to calculate for the following reasons:   The valid total cholesterol range is 130 to 320 mg/dL   ---------------------------------------------------------------------------------------------------  He also report he has been having much more  pain in his neck and back for the last few months, limiting his activity level. He does reports history of having steroid injections, a few years ago in [place] which provided some temporary relief.    Medications: Outpatient Medications Prior to Visit  Medication Sig   allopurinol (ZYLOPRIM) 100 MG tablet TAKE 1 TABLET BY MOUTH EVERY DAY   B-D UF III MINI PEN NEEDLES 31G X 5 MM MISC USE 1 TO CHECK BLOOD SUGAR 3 TIMES DAILY   Calcium Citrate 200 MG TABS Take 200 mg by mouth daily. Reported on 11/21/2015   carvedilol (COREG)  3.125 MG tablet TAKE 1 TABLET (3.125 MG TOTAL) BY MOUTH 2 (TWO) TIMES DAILY.   Cholecalciferol (D3-1000 PO) Take 1,000 mg by mouth daily.   Continuous Blood Gluc Receiver (FREESTYLE LIBRE 14 DAY READER) DEVI 1 Device by Does not apply route every 14 (fourteen) days. for type insulin dependent type 2 diabetes   Continuous Blood Gluc Sensor (FREESTYLE LIBRE 14 DAY SENSOR) MISC Place 1 Device onto the skin every 14 (fourteen) days. for insulin dependent type 2 diabetes   cyclobenzaprine (FLEXERIL) 5 MG tablet Take 1 tablet (5 mg total) by mouth 3 (three) times daily as needed for muscle spasms.   furosemide (LASIX) 40 MG tablet TAKE 1 TABLET BY MOUTH TWICE A DAY   lisinopril (ZESTRIL) 10 MG tablet Take 0.5 tablets (5 mg total) by mouth daily.   magnesium gluconate (MAGONATE) 500 MG tablet Take 500 mg by mouth daily.    Multiple Vitamin (MULTIVITAMIN WITH MINERALS) TABS tablet Take 1 tablet by mouth daily.   naltrexone (DEPADE) 50 MG tablet Take 1 tablet (50 mg total) by mouth daily.   niacin 500 MG tablet Take 500 mg by mouth at bedtime.   NOVOLOG FLEXPEN 100 UNIT/ML FlexPen Inject 20 units up to three times a day before meals.   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   promethazine (PHENERGAN) 25 MG tablet Take 1 tablet (25 mg total) by mouth every 8 (eight) hours as needed for nausea or vomiting.   pyridOXINE (VITAMIN B-6) 50 MG tablet Take 50 mg by mouth daily.   sertraline (ZOLOFT) 100 MG tablet TAKE 1 TABLET BY MOUTH EVERY DAY   spironolactone (ALDACTONE) 100 MG tablet TAKE 1 TABLET BY MOUTH EVERY DAY   TRESIBA FLEXTOUCH 100 UNIT/ML FlexTouch Pen INJECT UP TO 20 UNITS UNDER THE SKIN EVERY DAY AT BEDTIME NEED INSURANCE CARD   zinc gluconate 50 MG tablet Take 50 mg by mouth daily.   silver  sulfADIAZINE (SILVADENE) 1 % cream Apply 1 application topically daily.   No facility-administered medications prior to visit.    Review of Systems  Constitutional:  Negative for appetite change, chills and fever.  Respiratory:  Positive for shortness of breath. Negative for chest tightness and wheezing.   Cardiovascular:  Negative for chest pain and palpitations.  Gastrointestinal:  Negative for abdominal pain, nausea and vomiting.  Neurological:        Tingling in hands      Objective    BP 102/71 (BP Location: Right Arm, Patient Position: Sitting, Cuff Size: Normal)    Pulse 69    Temp 98.8 F (37.1 C) (Oral)    Resp 14    Wt 158 lb (71.7  kg)    SpO2 100% Comment: room air   BMI 26.29 kg/m  {Show previous vital signs (optional):23777}  Physical Exam    General: Appearance:     Overweight male in no acute distress  Eyes:    PERRL, conjunctiva/corneas clear, EOM's intact       Lungs:     Clear to auscultation bilaterally, respirations unlabored  Heart:    Normal heart rate. Normal rhythm. No murmurs, rubs, or gallops.    MS:   All extremities are intact.    Neurologic:   Awake, alert, oriented x 3. No apparent focal neurological defect.        Results for orders placed or performed in visit on 08/15/21  POCT HgB A1C  Result Value Ref Range   Hemoglobin A1C 7.9 (A) 4.0 - 5.6 %   Est. average glucose Bld gHb Est-mCnc 180      Assessment & Plan     1. Type 2 diabetes mellitus with hyperglycemia, with long-term current use of insulin (HCC) A1c is up today. He anticipates making some improvements in diet and working on weight. Continue current medications.    2. Prescription for Shingrix. Vaccine not administered in office.   - Zoster Vaccine Adjuvanted [facility]) injection; Inject 0.5 mLs into the muscle once for 1 dose.  Dispense: 0.5 mL; Refill: 0 #2  3. Primary hypertension Well controlled.  Continue current medications.    4. Lipodystrophy  - CBC -  Comprehensive metabolic panel - Lipid panel  5. Osteoarthritis of back Considering underlying liver disease will avoid NSAID at this point. Can try occasional traMADol (ULTRAM) 50 MG tablet; Take 1 tablet (50 mg total) by mouth every 8 (eight) hours as needed.  Dispense: 15 tablet; Refill: 1  Suggested he follow up with orthopedist in [place] where he apparently had steroid injections in the past.   6. Osteoarthritis of spine with radiculopathy, cervical region  - traMADol (ULTRAM) 50 MG tablet; Take 1 tablet (50 mg total) by mouth every 8 (eight) hours as needed.  Dispense: 15 tablet; Refill: 1  7. Alcohol dependence in remission [facility]) He reports he continues to abstain completely.       Future Appointments  Date Time Provider [place]  12/22/2021 11:00 AM Caryn Section, [name], MD [facility]    The entirety of the information documented in the History of Present Illness, Review of Systems and Physical Exam were personally obtained by me. Portions of this information were initially documented by the CMA and reviewed by me for thoroughness and accuracy.     Lelon Huh, MD  [facility] [phone] (phone) [phone] (fax)  [place]

## 2021-08-25 ENCOUNTER — Other Ambulatory Visit: Payer: Self-pay | Admitting: Gastroenterology

## 2021-08-25 ENCOUNTER — Other Ambulatory Visit: Payer: Self-pay | Admitting: Family Medicine

## 2021-08-25 DIAGNOSIS — F1010 Alcohol abuse, uncomplicated: Secondary | ICD-10-CM

## 2021-09-01 DIAGNOSIS — L570 Actinic keratosis: Secondary | ICD-10-CM | POA: Diagnosis not present

## 2021-09-01 DIAGNOSIS — X32XXXD Exposure to sunlight, subsequent encounter: Secondary | ICD-10-CM | POA: Diagnosis not present

## 2021-09-05 ENCOUNTER — Encounter: Payer: Self-pay | Admitting: Family Medicine

## 2021-09-08 DIAGNOSIS — E881 Lipodystrophy, not elsewhere classified: Secondary | ICD-10-CM | POA: Diagnosis not present

## 2021-09-09 LAB — COMPREHENSIVE METABOLIC PANEL
ALT: 20 IU/L (ref 0–44)
AST: 20 IU/L (ref 0–40)
Albumin/Globulin Ratio: 1.8 (ref 1.2–2.2)
Albumin: 4.6 g/dL (ref 3.8–4.8)
Alkaline Phosphatase: 193 IU/L — ABNORMAL HIGH (ref 44–121)
BUN/Creatinine Ratio: 16 (ref 10–24)
BUN: 14 mg/dL (ref 8–27)
Bilirubin Total: 0.8 mg/dL (ref 0.0–1.2)
CO2: 27 mmol/L (ref 20–29)
Calcium: 9.8 mg/dL (ref 8.6–10.2)
Chloride: 95 mmol/L — ABNORMAL LOW (ref 96–106)
Creatinine, Ser: 0.9 mg/dL (ref 0.76–1.27)
Globulin, Total: 2.6 g/dL (ref 1.5–4.5)
Glucose: 95 mg/dL (ref 70–99)
Potassium: 5.1 mmol/L (ref 3.5–5.2)
Sodium: 136 mmol/L (ref 134–144)
Total Protein: 7.2 g/dL (ref 6.0–8.5)
eGFR: 92 mL/min/{1.73_m2} (ref >59)

## 2021-09-09 LAB — CBC
Hematocrit: 39.5 % (ref 37.5–51.0)
Hemoglobin: 13.6 g/dL (ref 13.0–17.7)
MCH: 30.8 pg (ref 26.6–33.0)
MCHC: 34.4 g/dL (ref 31.5–35.7)
MCV: 90 fL (ref 79–97)
Platelets: 221 10*3/uL (ref 150–450)
RBC: 4.41 x10E6/uL (ref 4.14–5.80)
RDW: 14.7 % (ref 11.6–15.4)
WBC: 8.6 10*3/uL (ref 3.4–10.8)

## 2021-09-09 LAB — LIPID PANEL
Chol/HDL Ratio: 3.1 ratio (ref 0.0–5.0)
Cholesterol, Total: 130 mg/dL (ref 100–199)
HDL: 42 mg/dL (ref >39)
LDL Chol Calc (NIH): 75 mg/dL (ref 0–99)
Triglycerides: 64 mg/dL (ref 0–149)
VLDL Cholesterol Cal: 13 mg/dL (ref 5–40)

## 2021-09-11 ENCOUNTER — Encounter: Payer: Self-pay | Admitting: Family Medicine

## 2021-09-21 ENCOUNTER — Other Ambulatory Visit: Payer: Self-pay | Admitting: Family Medicine

## 2021-10-23 ENCOUNTER — Other Ambulatory Visit: Payer: Self-pay | Admitting: Family Medicine

## 2021-10-23 ENCOUNTER — Other Ambulatory Visit: Payer: Self-pay | Admitting: Gastroenterology

## 2021-10-23 DIAGNOSIS — E1165 Type 2 diabetes mellitus with hyperglycemia: Secondary | ICD-10-CM

## 2021-10-23 NOTE — Telephone Encounter (Signed)
Please advise quantity to dispense.   ?

## 2021-11-01 ENCOUNTER — Telehealth: Payer: Self-pay | Admitting: Family Medicine

## 2021-11-01 ENCOUNTER — Telehealth: Payer: Self-pay

## 2021-11-01 NOTE — Telephone Encounter (Signed)
Source Subject Topic  ?[name] (Patient) [name] (Patient) General - Other  ?Reason for CRM: Pt stated his wife advised him to call and ask to speak with Anderson Malta. Pt stated his wife spoke with her and advised her to have him call her back. Pt stated he isn't sure what it is about, and he only wants to speak with Anderson Malta.  ?  ?Pt requested a call back.  ? ?

## 2021-11-01 NOTE — Telephone Encounter (Signed)
Returned pt's call.  It was [name] that called pt.  I scheduled AWV for pt.

## 2021-11-01 NOTE — Telephone Encounter (Signed)
Copied from [place] [phone]. Topic: Medicare AWV ?>> Nov 01, 2021  9:31 AM Cher Nakai R wrote: ?Reason for CRM:  ?Left message for patient to call back and schedule Medicare Annual Wellness Visit (AWV) in office.  ? ?If unable to come into the office for AWV,  please offer to do virtually or by telephone. ? ?Last AWV:  05/18/2020 ? ?Please schedule at anytime with [facility] Health Advisor. ? ?30 minute appointment for Virtual or phone ?45 minute appointment for in office or Initial virtual/phone ? ?Any questions, please contact me at [phone] ?

## 2021-11-06 ENCOUNTER — Ambulatory Visit: Payer: HMO

## 2021-11-08 ENCOUNTER — Telehealth: Payer: Self-pay | Admitting: Family Medicine

## 2021-11-08 NOTE — Telephone Encounter (Signed)
Please check with patient regarding glucometer supplies. Received req from [facility] for prescription for Accucheck Fastclix lancet Drum, but we had prescribed. [name] for him in December. What is he using to check his blood sugars? ?

## 2021-11-14 ENCOUNTER — Telehealth: Payer: Self-pay | Admitting: Family Medicine

## 2021-11-14 ENCOUNTER — Other Ambulatory Visit: Payer: Self-pay | Admitting: Gastroenterology

## 2021-11-14 NOTE — Telephone Encounter (Signed)
Medication Refill - Medication:  ?Accucheck Fastclix lancet Drum ? ?Has the patient contacted their pharmacy? Yes.   ?Contact PCP ? ?Preferred Pharmacy (with phone number or street name):  ?[id number] [place] ?[address] [place] ?Phone:  [phone] Fax:  [phone]? ?Has the patient been seen for an appointment in the last year OR does the patient have an upcoming appointment? Yes.   ? ?Agent: Please be advised that RX refills may take up to 3 business days. We ask that you follow-up with your pharmacy. ?

## 2021-11-15 ENCOUNTER — Other Ambulatory Visit: Payer: Self-pay

## 2021-11-16 ENCOUNTER — Other Ambulatory Visit: Payer: Self-pay

## 2021-11-16 NOTE — Telephone Encounter (Signed)
Patient has freestyle libre and should not need to have fingersticks.  Patient advised ?

## 2021-11-29 ENCOUNTER — Other Ambulatory Visit: Payer: Self-pay | Admitting: Gastroenterology

## 2021-12-12 ENCOUNTER — Other Ambulatory Visit: Payer: Self-pay | Admitting: Gastroenterology

## 2021-12-22 ENCOUNTER — Ambulatory Visit: Payer: Self-pay | Admitting: *Deleted

## 2021-12-22 ENCOUNTER — Ambulatory Visit: Payer: HMO | Admitting: Family Medicine

## 2021-12-22 NOTE — Progress Notes (Deleted)
Established patient visit   Patient: [name]   DOB: 21-Dec-1950   71 y.o. Male  MRN: [id number] Visit Date: 12/22/2021  Today's healthcare provider: Lelon Huh, MD   No chief complaint on file.  Subjective    HPI  Diabetes Mellitus Type II, Follow-up  Lab Results  Component Value Date   HGBA1C 7.9 (A) 08/15/2021   HGBA1C 6.4 (A) 01/17/2021   HGBA1C 8.0 (A) 09/14/2020   Wt Readings from Last 3 Encounters:  08/15/21 158 lb (71.7 kg)  03/10/21 154 lb (69.9 kg)  01/17/21 148 lb (67.1 kg)   Last seen for diabetes 4 months ago.  Management since then includes continuing same medication. He reports {excellent/good/fair/poor:19665} compliance with treatment. He {is/is not:21021397} having side effects. {document side effects if present:1} Symptoms: {Yes/No:20286} fatigue {Yes/No:20286} foot ulcerations  {Yes/No:20286} appetite changes {Yes/No:20286} nausea  {Yes/No:20286} paresthesia of the feet  {Yes/No:20286} polydipsia  {Yes/No:20286} polyuria {Yes/No:20286} visual disturbances   {Yes/No:20286} vomiting     Home blood sugar records: {diabetes glucometry results:16657}  Episodes of hypoglycemia? {Yes/No:20286} {enter symptoms and frequency of symptoms if yes:1}   Current insulin regiment: {enter 'none' or type of insulin and number of units taken with each dose of each insulin formulation that the patient is taking:1} Most Recent Eye Exam: *** {Current exercise:16438:::1} {Current diet habits:16563:::1}  Pertinent Labs: Lab Results  Component Value Date   CHOL 130 09/08/2021   HDL 42 09/08/2021   LDLCALC 75 09/08/2021   TRIG 64 09/08/2021   CHOLHDL 3.1 09/08/2021   Lab Results  Component Value Date   NA 136 09/08/2021   K 5.1 09/08/2021   CREATININE 0.90 09/08/2021   EGFR 92 09/08/2021     ---------------------------------------------------------------------------------------------------   Hypertension, follow-up  BP Readings from Last 3  Encounters:  08/15/21 102/71  03/10/21 105/68  01/17/21 (!) 145/94   Wt Readings from Last 3 Encounters:  08/15/21 158 lb (71.7 kg)  03/10/21 154 lb (69.9 kg)  01/17/21 148 lb (67.1 kg)     He was last seen for hypertension 4 months ago.  BP at that visit was 102/71. Management since that visit includes continue same medication.  He reports {excellent/good/fair/poor:19665} compliance with treatment. He {is/is not:9024} having side effects. {document side effects if present:1} He is following a {diet:21022986} diet. He {is/is not:9024} exercising. He {does/does not:200015} smoke.  Use of agents associated with hypertension: {bp agents assoc with hypertension:511::"none"}.   Outside blood pressures are {***enter patient reported home BP readings, or 'not being checked':1}. Symptoms: {Yes/No:20286} chest pain {Yes/No:20286} chest pressure  {Yes/No:20286} palpitations {Yes/No:20286} syncope  {Yes/No:20286} dyspnea {Yes/No:20286} orthopnea  {Yes/No:20286} paroxysmal nocturnal dyspnea {Yes/No:20286} lower extremity edema   Pertinent labs Lab Results  Component Value Date   CHOL 130 09/08/2021   HDL 42 09/08/2021   LDLCALC 75 09/08/2021   TRIG 64 09/08/2021   CHOLHDL 3.1 09/08/2021   Lab Results  Component Value Date   NA 136 09/08/2021   K 5.1 09/08/2021   CREATININE 0.90 09/08/2021   EGFR 92 09/08/2021   GLUCOSE 95 09/08/2021   TSH 1.99 11/02/2014     The 10-year ASCVD risk score (Arnett DK, et al., 2019) is: 24.6%  ---------------------------------------------------------------------------------------------------   Follow up for Osteoarthritis:  The patient was last seen for this 4 months ago. Changes made at last visit include trying occasional traMADol (ULTRAM) 50 MG tablet; Take 1 tablet (50 mg total) by mouth every 8 (eight) hours as needed. Suggested he follow up  with orthopedist in [place] where he apparently had steroid injections in the past.  He reports  {excellent/good/fair/poor:19665} compliance with treatment. He feels that condition is {improved/worse/unchanged:3041574}. He {is/is not:21021397} having side effects. ***  -----------------------------------------------------------------------------------------   Medications: Outpatient Medications Prior to Visit  Medication Sig   allopurinol (ZYLOPRIM) 100 MG tablet TAKE 1 TABLET BY MOUTH EVERY DAY   B-D UF III MINI PEN NEEDLES 31G X 5 MM MISC USE 1 TO CHECK BLOOD SUGAR 3 TIMES DAILY   Calcium Citrate 200 MG TABS Take 200 mg by mouth daily. Reported on 11/21/2015   carvedilol (COREG) 3.125 MG tablet TAKE 1 TABLET (3.125 MG TOTAL) BY MOUTH 2 (TWO) TIMES DAILY. SCHEDULE OFFICE VISIT   Cholecalciferol (D3-1000 PO) Take 1,000 mg by mouth daily.   Continuous Blood Gluc Receiver (FREESTYLE LIBRE 14 DAY READER) DEVI 1 Device by Does not apply route every 14 (fourteen) days. for type insulin dependent type 2 diabetes   Continuous Blood Gluc Sensor (FREESTYLE LIBRE 14 DAY SENSOR) MISC Place 1 Device onto the skin every 14 (fourteen) days. for insulin dependent type 2 diabetes   cyclobenzaprine (FLEXERIL) 5 MG tablet Take 1 tablet (5 mg total) by mouth 3 (three) times daily as needed for muscle spasms.   furosemide (LASIX) 40 MG tablet TAKE 1 TABLET BY MOUTH TWICE A DAY   HYDROcodone-acetaminophen (NORCO/VICODIN) 5-325 MG tablet Take 1 tablet by mouth every 8 (eight) hours.   HYDROcodone-acetaminophen (NORCO/VICODIN) 5-325 MG tablet hydrocodone 5 mg-acetaminophen 325 mg tablet  Take 1 tablet every 8 hours by oral route.   insulin degludec (TRESIBA FLEXTOUCH) 100 UNIT/ML FlexTouch Pen INJECT UP TO 20 UNITS UNDER THE SKIN EVERY DAY AT BEDTIME NEED INSURANCE CARD   lisinopril (ZESTRIL) 10 MG tablet Take 0.5 tablets (5 mg total) by mouth daily.   lisinopril (ZESTRIL) 5 MG tablet    magnesium gluconate (MAGONATE) 500 MG tablet Take 500 mg by mouth daily.    Multiple Vitamin (MULTIVITAMIN WITH MINERALS)  TABS tablet Take 1 tablet by mouth daily.   naltrexone (DEPADE) 50 MG tablet TAKE 1 TABLET BY MOUTH EVERY DAY   niacin 500 MG tablet Take 500 mg by mouth at bedtime.   NOVOLOG FLEXPEN 100 UNIT/ML FlexPen Inject 20 units up to three times a day before meals.   omeprazole (PRILOSEC) 40 MG capsule TAKE 1 CAPSULE BY MOUTH EVERY DAY   ONETOUCH VERIO test strip USE TO CHECK SUGAR THREE TIMES DAILY FOR INSULIN DEPENDENT TYPE 2 DIABETES   Potassium Gluconate 550 MG TABS Take 550 mg by mouth daily.   PRILOSEC OTC 20 MG tablet    promethazine (PHENERGAN) 25 MG tablet Take 1 tablet (25 mg total) by mouth every 8 (eight) hours as needed for nausea or vomiting.   pyridOXINE (VITAMIN B-6) 50 MG tablet Take 50 mg by mouth daily.   sertraline (ZOLOFT) 100 MG tablet TAKE 1 TABLET BY MOUTH EVERY DAY   silver sulfADIAZINE (SILVADENE) 1 % cream Apply 1 application topically daily.   spironolactone (ALDACTONE) 100 MG tablet TAKE 1 TABLET (100 MG TOTAL) BY MOUTH DAILY. MUST SCHEDULE OFFICE VISIT   traMADol (ULTRAM) 50 MG tablet Take 1 tablet (50 mg total) by mouth every 8 (eight) hours as needed.   traMADol (ULTRAM) 50 MG tablet tramadol 50 mg tablet  Take One tab PO Q6 hours PRN pain   zinc gluconate 50 MG tablet Take 50 mg by mouth daily.   No facility-administered medications prior to visit.    Review of  Systems  {Labs  Heme  Chem  Endocrine  Serology  Results Review (optional):23779}   Objective    There were no vitals taken for this visit. {Show previous vital signs (optional):23777}  Physical Exam  ***  No results found for any visits on 12/22/21.  Assessment & Plan     ***  No follow-ups on file.      {provider attestation***:1}   Lelon Huh, MD  [facility] [phone] (phone) [phone] (fax)  [place]

## 2021-12-22 NOTE — Telephone Encounter (Signed)
  Reason for Disposition . Health Information question, no triage required and triager able to answer question  Answer Assessment - Initial Assessment Questions 1. REASON FOR CALL or QUESTION: "What is your reason for calling today?" or "How can I best help you?" or "What question do you have that I can help answer?"     Just wants to reschedule appt due to he is sick today.  Protocols used: Information Only Call - No Triage-A-AH  No triage needed. Pt sick today and wanted to reschedule appt and we did for Monday.

## 2021-12-25 ENCOUNTER — Ambulatory Visit (INDEPENDENT_AMBULATORY_CARE_PROVIDER_SITE_OTHER): Payer: HMO | Admitting: Physician Assistant

## 2021-12-25 ENCOUNTER — Encounter: Payer: Self-pay | Admitting: Physician Assistant

## 2021-12-25 VITALS — BP 116/86 | HR 104 | Ht 66.0 in | Wt 153.6 lb

## 2021-12-25 DIAGNOSIS — R110 Nausea: Secondary | ICD-10-CM | POA: Diagnosis not present

## 2021-12-25 DIAGNOSIS — Z794 Long term (current) use of insulin: Secondary | ICD-10-CM | POA: Diagnosis not present

## 2021-12-25 DIAGNOSIS — E1165 Type 2 diabetes mellitus with hyperglycemia: Secondary | ICD-10-CM

## 2021-12-25 LAB — POCT GLYCOSYLATED HEMOGLOBIN (HGB A1C): Hemoglobin A1C: 5.7 % — AB (ref 4.0–5.6)

## 2021-12-25 MED ORDER — PROMETHAZINE HCL 25 MG PO TABS: 25.0000 mg | ORAL_TABLET | Freq: Three times a day (TID) | ORAL | 3 refills | Status: DC | PRN

## 2021-12-25 NOTE — Progress Notes (Signed)
I,Sha'taria Tyson,acting as a Education administrator for [employer], PA-C.,have documented all relevant documentation on the behalf of Mikey Kirschner, PA-C,as directed by  Mikey Kirschner, PA-C while in the presence of Mikey Kirschner, PA-C.   Established patient visit   Patient: [name]   DOB: April 29, 1951   71 y.o. Male  MRN: [id number] Visit Date: 12/25/2021  Today's healthcare provider: Mikey Kirschner, PA-C   Cc. DMII follow up   Subjective    HPI  Diabetes Mellitus Type II, Follow-up  Lab Results  Component Value Date   HGBA1C 5.7 (A) 12/25/2021   HGBA1C 7.9 (A) 08/15/2021   HGBA1C 6.4 (A) 01/17/2021   Wt Readings from Last 3 Encounters:  12/25/21 153 lb 9.6 oz (69.7 kg)  08/15/21 158 lb (71.7 kg)  03/10/21 154 lb (69.9 kg)   Last seen for diabetes 4 months ago.  Management since then includes anticipates making some improvements in diet and working on weight. Continue current medications. He reports excellent compliance with treatment. He is not having side effects.  Symptoms: No fatigue No foot ulcerations  No appetite changes No nausea  No paresthesia of the feet  No polydipsia  No polyuria No visual disturbances   No vomiting     Home blood sugar records: fasting range: 65 this morning  Episodes of hypoglycemia? Yes feeling heavy, fatigued, weak. Sugars have been consistently < 100 in AM.   Current insulin regiment: 8 U before meals if sugars are 130s-150s; more if sugars are higher. pt was instructed at last visit take between 20-30 at bedtime depending on sugars.  Most Recent Eye Exam: UTD Current exercise: walking Current diet habits: diabetic  Pertinent Labs: Lab Results  Component Value Date   CHOL 130 09/08/2021   HDL 42 09/08/2021   LDLCALC 75 09/08/2021   TRIG 64 09/08/2021   CHOLHDL 3.1 09/08/2021   Lab Results  Component Value Date   NA 136 09/08/2021   K 5.1 09/08/2021   CREATININE 0.90 09/08/2021   EGFR 92 09/08/2021      ---------------------------------------------------------------------------------------------------   Medications: Outpatient Medications Prior to Visit  Medication Sig   allopurinol (ZYLOPRIM) 100 MG tablet TAKE 1 TABLET BY MOUTH EVERY DAY   B-D UF III MINI PEN NEEDLES 31G X 5 MM MISC USE 1 TO CHECK BLOOD SUGAR 3 TIMES DAILY   Calcium Citrate 200 MG TABS Take 200 mg by mouth daily. Reported on 11/21/2015   carvedilol (COREG) 3.125 MG tablet TAKE 1 TABLET (3.125 MG TOTAL) BY MOUTH 2 (TWO) TIMES DAILY. SCHEDULE OFFICE VISIT   Cholecalciferol (D3-1000 PO) Take 1,000 mg by mouth daily.   Continuous Blood Gluc Receiver (FREESTYLE LIBRE 14 DAY READER) DEVI 1 Device by Does not apply route every 14 (fourteen) days. for type insulin dependent type 2 diabetes   Continuous Blood Gluc Sensor (FREESTYLE LIBRE 14 DAY SENSOR) MISC Place 1 Device onto the skin every 14 (fourteen) days. for insulin dependent type 2 diabetes   cyclobenzaprine (FLEXERIL) 5 MG tablet Take 1 tablet (5 mg total) by mouth 3 (three) times daily as needed for muscle spasms.   furosemide (LASIX) 40 MG tablet TAKE 1 TABLET BY MOUTH TWICE A DAY   HYDROcodone-acetaminophen (NORCO/VICODIN) 5-325 MG tablet Take 1 tablet by mouth every 8 (eight) hours.   HYDROcodone-acetaminophen (NORCO/VICODIN) 5-325 MG tablet hydrocodone 5 mg-acetaminophen 325 mg tablet  Take 1 tablet every 8 hours by oral route.   insulin degludec (TRESIBA FLEXTOUCH) 100 UNIT/ML FlexTouch Pen INJECT UP  TO 20 UNITS UNDER THE SKIN EVERY DAY AT BEDTIME NEED INSURANCE CARD   lisinopril (ZESTRIL) 10 MG tablet Take 0.5 tablets (5 mg total) by mouth daily.   lisinopril (ZESTRIL) 5 MG tablet    magnesium gluconate (MAGONATE) 500 MG tablet Take 500 mg by mouth daily.    Multiple Vitamin (MULTIVITAMIN WITH MINERALS) TABS tablet Take 1 tablet by mouth daily.   naltrexone (DEPADE) 50 MG tablet TAKE 1 TABLET BY MOUTH EVERY DAY   niacin 500 MG tablet Take 500 mg by mouth at  bedtime.   NOVOLOG FLEXPEN 100 UNIT/ML FlexPen Inject 20 units up to three times a day before meals.   omeprazole (PRILOSEC) 40 MG capsule TAKE 1 CAPSULE BY MOUTH EVERY DAY   ONETOUCH VERIO test strip USE TO CHECK SUGAR THREE TIMES DAILY FOR INSULIN DEPENDENT TYPE 2 DIABETES   Potassium Gluconate 550 MG TABS Take 550 mg by mouth daily.   PRILOSEC OTC 20 MG tablet    pyridOXINE (VITAMIN B-6) 50 MG tablet Take 50 mg by mouth daily.   sertraline (ZOLOFT) 100 MG tablet TAKE 1 TABLET BY MOUTH EVERY DAY   silver sulfADIAZINE (SILVADENE) 1 % cream Apply 1 application topically daily.   spironolactone (ALDACTONE) 100 MG tablet TAKE 1 TABLET (100 MG TOTAL) BY MOUTH DAILY. MUST SCHEDULE OFFICE VISIT   traMADol (ULTRAM) 50 MG tablet Take 1 tablet (50 mg total) by mouth every 8 (eight) hours as needed.   traMADol (ULTRAM) 50 MG tablet tramadol 50 mg tablet  Take One tab PO Q6 hours PRN pain   zinc gluconate 50 MG tablet Take 50 mg by mouth daily.   [DISCONTINUED] promethazine (PHENERGAN) 25 MG tablet Take 1 tablet (25 mg total) by mouth every 8 (eight) hours as needed for nausea or vomiting.   No facility-administered medications prior to visit.    Review of Systems  Constitutional:  Positive for fatigue. Negative for fever.  Respiratory:  Negative for cough and shortness of breath.   Cardiovascular:  Negative for chest pain, palpitations and leg swelling.  Neurological:  Positive for weakness. Negative for dizziness and headaches.       Objective    Blood pressure 116/86, pulse (!) 104, height 5' 6" (1.676 m), weight 153 lb 9.6 oz (69.7 kg), SpO2 99 %.   Physical Exam Constitutional:      General: He is awake.     Appearance: He is well-developed.  HENT:     Head: Normocephalic.  Eyes:     Conjunctiva/sclera: Conjunctivae normal.  Cardiovascular:     Rate and Rhythm: Normal rate and regular rhythm.     Heart sounds: Normal heart sounds.  Pulmonary:     Effort: Pulmonary effort is  normal.     Breath sounds: Normal breath sounds.  Skin:    General: Skin is warm.  Neurological:     Mental Status: He is alert and oriented to person, place, and time.  Psychiatric:        Attention and Perception: Attention normal.        Mood and Affect: Mood normal.        Speech: Speech normal.        Behavior: Behavior is cooperative.     Results for orders placed or performed in visit on 12/25/21  POCT HgB A1C  Result Value Ref Range   Hemoglobin A1C 5.7 (A) 4.0 - 5.6 %   HbA1c POC (<> result, manual entry)     HbA1c, POC (prediabetic   range)     HbA1c, POC (controlled diabetic range)      Assessment & Plan     Problem List Items Addressed This Visit       Endocrine   Type 2 diabetes mellitus with hyperglycemia, with long-term current use of insulin (HCC) - Primary    A1c today down to 5.7% and having hypoglycemic episodes. Pt took 22 u last night -- advising 20 U for the next 3 days, and then likely down to 18 U nightly. Advised pt his goal should be fasting 100-120. If still consistently < 100 in AM, can decrease down to 16 U.  Advised if any confusion or unexpected bs values to call office. F/u 3 mo      Relevant Orders   POCT HgB A1C (Completed)   Other Visit Diagnoses     Nausea       Relevant Medications   promethazine (PHENERGAN) 25 MG tablet      --pulled for refill.   Return in about 3 months (around 03/27/2022) for DMII.      I, Mikey Kirschner, PA-C have reviewed all documentation for this visit. The documentation on  _0 @  for the exam, diagnosis, procedures, and orders are all accurate and complete.  Mikey Kirschner, PA-C [facility] [address] [place], [place], [address] Office: [phone] Fax: [phone]  [phone] (fax)  [place]

## 2021-12-25 NOTE — Assessment & Plan Note (Addendum)
A1c today down to 5.7% and having hypoglycemic episodes. Pt took 22 u last night -- advising 20 U for the next 3 days, and then likely down to 18 U nightly. Advised pt his goal should be fasting 100-120. If still consistently < 100 in AM, can decrease down to 16 U.  Advised if any confusion or unexpected bs values to call office. F/u 3 mo

## 2022-01-01 ENCOUNTER — Telehealth: Payer: Self-pay | Admitting: Family Medicine

## 2022-01-31 ENCOUNTER — Telehealth: Payer: Self-pay | Admitting: Family Medicine

## 2022-01-31 NOTE — Telephone Encounter (Signed)
Copied from [place]. Topic: Medicare AWV >> Jan 31, 2022 11:46 AM Jae Dire wrote: Reason for CRM:  Left message for patient to call back and schedule Medicare Annual Wellness Visit (AWV) in office.   If unable to come into the office for AWV,  please offer to do virtually or by telephone.  Last AWV: 05/18/2020  Please schedule at anytime with [facility] Health Advisor.  30 minute appointment for Virtual or phone 45 minute appointment for in office or Initial virtual/phone  Any questions, please contact me at [phone]

## 2022-01-31 NOTE — Telephone Encounter (Signed)
Pt is returning call stated will be out of the house can speak with wife Rickey Sadowski to schedule.  Please advise.

## 2022-02-01 NOTE — Telephone Encounter (Signed)
Left message for patient to return call.

## 2022-02-04 ENCOUNTER — Other Ambulatory Visit: Payer: Self-pay | Admitting: Gastroenterology

## 2022-02-07 NOTE — Telephone Encounter (Signed)
Letter mailed asking pt to call and schedule O... Last appt was 09/2019

## 2022-02-20 ENCOUNTER — Other Ambulatory Visit: Payer: Self-pay | Admitting: Family Medicine

## 2022-02-20 DIAGNOSIS — E1165 Type 2 diabetes mellitus with hyperglycemia: Secondary | ICD-10-CM

## 2022-03-02 DIAGNOSIS — X32XXXD Exposure to sunlight, subsequent encounter: Secondary | ICD-10-CM | POA: Diagnosis not present

## 2022-03-02 DIAGNOSIS — L570 Actinic keratosis: Secondary | ICD-10-CM | POA: Diagnosis not present

## 2022-03-04 ENCOUNTER — Other Ambulatory Visit: Payer: Self-pay | Admitting: Gastroenterology

## 2022-03-07 ENCOUNTER — Telehealth: Payer: Self-pay | Admitting: Family Medicine

## 2022-03-07 NOTE — Telephone Encounter (Signed)
Copied from [place] [id number]. Topic: Medicare AWV >> Mar 07, 2022 10:56 AM Jae Dire wrote: Reason for CRM:  No answer unable to leave a  message for patient to call back and schedule Medicare Annual Wellness Visit (AWV) in office.   If unable to come into the office for AWV,  please offer to do virtually or by telephone.  Last AWV: 05/18/2020  Please schedule at anytime with [facility] Health Advisor.  30 minute appointment for Virtual or phone 45 minute appointment for in office or Initial virtual/phone  Any questions, please contact me at [phone]

## 2022-03-09 ENCOUNTER — Other Ambulatory Visit: Payer: Self-pay | Admitting: Family Medicine

## 2022-03-09 DIAGNOSIS — E1165 Type 2 diabetes mellitus with hyperglycemia: Secondary | ICD-10-CM

## 2022-03-11 ENCOUNTER — Other Ambulatory Visit: Payer: Self-pay | Admitting: Family Medicine

## 2022-03-11 DIAGNOSIS — K219 Gastro-esophageal reflux disease without esophagitis: Secondary | ICD-10-CM

## 2022-03-26 ENCOUNTER — Encounter: Payer: Self-pay | Admitting: Family Medicine

## 2022-03-27 NOTE — Progress Notes (Signed)
[place] P[place],acting as a scribe for Lelon Huh, MD.,have documented all relevant documentation on the behalf of Lelon Huh, MD,as directed by  Lelon Huh, MD while in the presence of Lelon Huh, MD.               Established patient visit   Patient: [name]   DOB: 03/26/1951   71 y.o. Male  MRN: [id number] Visit Date: 03/28/2022  Today's healthcare provider: Lelon Huh, MD   No chief complaint on file.  Subjective    HPI  Diabetes Mellitus Type II, Follow-up  Lab Results  Component Value Date   HGBA1C 5.7 (A) 12/25/2021   HGBA1C 7.9 (A) 08/15/2021   HGBA1C 6.4 (A) 01/17/2021   Wt Readings from Last 3 Encounters:  03/28/22 155 lb (70.3 kg)  12/25/21 153 lb 9.6 oz (69.7 kg)  08/15/21 158 lb (71.7 kg)   Last seen for diabetes 3 months ago.  Management since then includes decreasing insulin due to last A1C of 5.7.. He reports good compliance with treatment. He is not having side effects.  Symptoms: No fatigue No foot ulcerations  No appetite changes No nausea  No paresthesia of the feet  No polydipsia  No polyuria No visual disturbances   No vomiting     Home blood sugar records:  100-150  Episodes of hypoglycemia? Yes 2-3 times since las visit   Current insulin regiment: 16 units Tresiba at night and sliding scale Novolog.  Most Recent Eye Exam: 08/03/21 Current exercise: activities around the house Current diet habits: nothing special  Pertinent Labs: Lab Results  Component Value Date   CHOL 130 09/08/2021   HDL 42 09/08/2021   LDLCALC 75 09/08/2021   TRIG 64 09/08/2021   CHOLHDL 3.1 09/08/2021   Lab Results  Component Value Date   NA 136 09/08/2021   K 5.1 09/08/2021   CREATININE 0.90 09/08/2021   EGFR 92 09/08/2021     ---------------------------------------------------------------------------------------------------   Medications: Outpatient Medications Prior to Visit  Medication Sig   allopurinol (ZYLOPRIM) 100 MG  tablet TAKE 1 TABLET BY MOUTH EVERY DAY   B-D UF III MINI PEN NEEDLES 31G X 5 MM MISC USE 1 TO CHECK BLOOD SUGAR 3 TIMES DAILY   Calcium Citrate 200 MG TABS Take 200 mg by mouth daily. Reported on 11/21/2015   carvedilol (COREG) 3.125 MG tablet TAKE 1 TABLET (3.125 MG TOTAL) BY MOUTH 2 (TWO) TIMES DAILY. SCHEDULE OFFICE VISIT   Cholecalciferol (D3-1000 PO) Take 1,000 mg by mouth daily.   clobetasol (TEMOVATE) 0.05 % external solution SMARTSIG:In Ear(s)   Continuous Blood Gluc Receiver (FREESTYLE LIBRE 14 DAY READER) DEVI 1 Device by Does not apply route every 14 (fourteen) days. for type insulin dependent type 2 diabetes   Continuous Blood Gluc Sensor (FREESTYLE LIBRE 14 DAY SENSOR) MISC Place 1 Device onto the skin every 14 (fourteen) days. for insulin dependent type 2 diabetes   cyclobenzaprine (FLEXERIL) 5 MG tablet Take 1 tablet (5 mg total) by mouth 3 (three) times daily as needed for muscle spasms.   fluticasone (CUTIVATE) 0.05 % cream Apply topically.   furosemide (LASIX) 40 MG tablet TAKE 1 TABLET BY MOUTH TWICE A DAY   HYDROcodone-acetaminophen (NORCO/VICODIN) 5-325 MG tablet Take 1 tablet by mouth every 8 (eight) hours.   HYDROcodone-acetaminophen (NORCO/VICODIN) 5-325 MG tablet hydrocodone 5 mg-acetaminophen 325 mg tablet  Take 1 tablet every 8 hours by oral route.   insulin degludec (TRESIBA FLEXTOUCH) 100 UNIT/ML FlexTouch Pen INJECT  UP TO 20 UNITS UNDER THE SKIN EVERY DAY AT BEDTIME NEED INSURANCE CARD   lisinopril (ZESTRIL) 10 MG tablet Take 0.5 tablets (5 mg total) by mouth daily.   lisinopril (ZESTRIL) 5 MG tablet    magnesium gluconate (MAGONATE) 500 MG tablet Take 500 mg by mouth daily.    Multiple Vitamin (MULTIVITAMIN WITH MINERALS) TABS tablet Take 1 tablet by mouth daily.   naltrexone (DEPADE) 50 MG tablet TAKE 1 TABLET BY MOUTH EVERY DAY   niacin 500 MG tablet Take 500 mg by mouth at bedtime.   NOVOLOG FLEXPEN 100 UNIT/ML FlexPen Inject 20 units up to three times a day  before meals.   omeprazole (PRILOSEC) 40 MG capsule TAKE 1 CAPSULE BY MOUTH EVERY DAY   ONETOUCH VERIO test strip USE TO CHECK SUGAR THREE TIMES DAILY FOR INSULIN DEPENDENT TYPE 2 DIABETES   Potassium Gluconate 550 MG TABS Take 550 mg by mouth daily.   PRILOSEC OTC 20 MG tablet    promethazine (PHENERGAN) 25 MG tablet Take 1 tablet (25 mg total) by mouth every 8 (eight) hours as needed for nausea or vomiting.   pyridOXINE (VITAMIN B-6) 50 MG tablet Take 50 mg by mouth daily.   sertraline (ZOLOFT) 100 MG tablet TAKE 1 TABLET BY MOUTH EVERY DAY   silver sulfADIAZINE (SILVADENE) 1 % cream Apply 1 application topically daily.   spironolactone (ALDACTONE) 100 MG tablet TAKE 1 TABLET (100 MG TOTAL) BY MOUTH DAILY. MUST SCHEDULE OFFICE VISIT   traMADol (ULTRAM) 50 MG tablet Take 1 tablet (50 mg total) by mouth every 8 (eight) hours as needed.   traMADol (ULTRAM) 50 MG tablet tramadol 50 mg tablet  Take One tab PO Q6 hours PRN pain   zinc gluconate 50 MG tablet Take 50 mg by mouth daily.   No facility-administered medications prior to visit.    Review of Systems  Constitutional:  Negative for appetite change, chills and fever.  Respiratory:  Negative for chest tightness, shortness of breath and wheezing.   Cardiovascular:  Negative for chest pain and palpitations.  Gastrointestinal:  Negative for abdominal pain, nausea and vomiting.       Objective    BP 117/74 (BP Location: Right Arm, Patient Position: Sitting, Cuff Size: Normal)   Pulse 64   Temp 98.4 F (36.9 C) (Oral)   Wt 155 lb (70.3 kg)   SpO2 100%   BMI 25.02 kg/m   Physical Exam    General: Appearance:    Well developed, well nourished male in no acute distress  Eyes:    PERRL, conjunctiva/corneas clear, EOM's intact       Lungs:     Clear to auscultation bilaterally, respirations unlabored  Heart:    Normal heart rate. Normal rhythm. No murmurs, rubs, or gallops.    MS:   All extremities are intact.    Neurologic:    Awake, alert, oriented x 3. No apparent focal neurological defect.         Assessment & Plan     1. Type 2 diabetes mellitus with hyperglycemia, with long-term current use of insulin (HCC) Doing well on current medications.  - Microalbumin / creatinine urine ratio - Hemoglobin A1c - Specimen status report  2. Alcoholic cirrhosis of liver without ascites (HCC)  - CBC - Comprehensive metabolic panel - AFP tumor marker  Prescriptions for :  hepatitis A vaccine (VAQTA) 50 UNIT/ML injection; Inject 1 mL (50 Units total) into the muscle once for 1 dose. FOR DX K74.6  Dispense: 0.5 mL; Refill: 1 hepatitis B vacrRecombinant (RECOMBIVAX HB) 5 MCG/0.5ML SUSY injection; Inject 1 mL into the muscle once for 1 dose. FOR DX K74.6  Dispense: 1 mL; Refill: 1  3. Vitamin D deficiency  - VITAMIN D 25 Hydroxy (Vit-D Deficiency, Fractures)  4. Osteoarthritis of back  - traMADol (ULTRAM) 50 MG tablet; Take 1 tablet (50 mg total) by mouth every 8 (eight) hours as needed.  Dispense: 15 tablet; Refill: 1  5. Osteoarthritis of spine with radiculopathy, cervical region  - traMADol (ULTRAM) 50 MG tablet; Take 1 tablet (50 mg total) by mouth every 8 (eight) hours as needed.  Dispense: 15 tablet; Refill: 1      The entirety of the information documented in the History of Present Illness, Review of Systems and Physical Exam were personally obtained by me. Portions of this information were initially documented by the CMA and reviewed by me for thoroughness and accuracy.     Lelon Huh, MD  [facility] [phone] (phone) [phone] (fax)  [place]

## 2022-03-28 ENCOUNTER — Ambulatory Visit (INDEPENDENT_AMBULATORY_CARE_PROVIDER_SITE_OTHER): Payer: HMO | Admitting: Family Medicine

## 2022-03-28 VITALS — BP 117/74 | HR 64 | Temp 98.4°F | Wt 155.0 lb

## 2022-03-28 DIAGNOSIS — M479 Spondylosis, unspecified: Secondary | ICD-10-CM | POA: Diagnosis not present

## 2022-03-28 DIAGNOSIS — M4722 Other spondylosis with radiculopathy, cervical region: Secondary | ICD-10-CM

## 2022-03-28 DIAGNOSIS — E559 Vitamin D deficiency, unspecified: Secondary | ICD-10-CM

## 2022-03-28 DIAGNOSIS — E1165 Type 2 diabetes mellitus with hyperglycemia: Secondary | ICD-10-CM | POA: Diagnosis not present

## 2022-03-28 DIAGNOSIS — Z794 Long term (current) use of insulin: Secondary | ICD-10-CM

## 2022-03-28 DIAGNOSIS — K7030 Alcoholic cirrhosis of liver without ascites: Secondary | ICD-10-CM

## 2022-03-28 MED ORDER — HEPATITIS B VAC RECOMBINANT 5 MCG/0.5ML IJ SUSY: 1.0000 mL | PREFILLED_SYRINGE | Freq: Once | INTRAMUSCULAR | 1 refills | Status: AC

## 2022-03-28 MED ORDER — HEPATITIS A VACCINE 50 UNIT/ML IM SUSP: 50.0000 [IU] | Freq: Once | INTRAMUSCULAR | 1 refills | Status: AC

## 2022-03-28 MED ORDER — TRAMADOL HCL 50 MG PO TABS: 50.0000 mg | ORAL_TABLET | Freq: Three times a day (TID) | ORAL | 1 refills | Status: DC | PRN

## 2022-03-29 ENCOUNTER — Telehealth: Payer: Self-pay

## 2022-03-29 LAB — COMPREHENSIVE METABOLIC PANEL
ALT: 17 IU/L (ref 0–44)
AST: 26 IU/L (ref 0–40)
Albumin/Globulin Ratio: 1.4 (ref 1.2–2.2)
Albumin: 4.2 g/dL (ref 3.8–4.8)
Alkaline Phosphatase: 199 IU/L — ABNORMAL HIGH (ref 44–121)
BUN/Creatinine Ratio: 10 (ref 10–24)
BUN: 7 mg/dL — ABNORMAL LOW (ref 8–27)
Bilirubin Total: 1 mg/dL (ref 0.0–1.2)
CO2: 25 mmol/L (ref 20–29)
Calcium: 9.5 mg/dL (ref 8.6–10.2)
Chloride: 99 mmol/L (ref 96–106)
Creatinine, Ser: 0.73 mg/dL — ABNORMAL LOW (ref 0.76–1.27)
Globulin, Total: 2.9 g/dL (ref 1.5–4.5)
Glucose: 111 mg/dL — ABNORMAL HIGH (ref 70–99)
Potassium: 5.6 mmol/L — ABNORMAL HIGH (ref 3.5–5.2)
Sodium: 140 mmol/L (ref 134–144)
Total Protein: 7.1 g/dL (ref 6.0–8.5)
eGFR: 97 mL/min/{1.73_m2} (ref >59)

## 2022-03-29 LAB — MICROALBUMIN / CREATININE URINE RATIO
Creatinine, Urine: 124.8 mg/dL
Microalb/Creat Ratio: 4 mg/g creat (ref 0–29)
Microalbumin, Urine: 4.4 ug/mL

## 2022-03-29 LAB — CBC
Hematocrit: 40.6 % (ref 37.5–51.0)
Hemoglobin: 13.9 g/dL (ref 13.0–17.7)
MCH: 32.7 pg (ref 26.6–33.0)
MCHC: 34.2 g/dL (ref 31.5–35.7)
MCV: 96 fL (ref 79–97)
Platelets: 178 10*3/uL (ref 150–450)
RBC: 4.25 x10E6/uL (ref 4.14–5.80)
RDW: 13.7 % (ref 11.6–15.4)
WBC: 7.2 10*3/uL (ref 3.4–10.8)

## 2022-03-29 LAB — VITAMIN D 25 HYDROXY (VIT D DEFICIENCY, FRACTURES): Vit D, 25-Hydroxy: 37.2 ng/mL (ref 30.0–100.0)

## 2022-03-29 LAB — HEMOGLOBIN A1C
Est. average glucose Bld gHb Est-mCnc: 160 mg/dL
Hgb A1c MFr Bld: 7.2 % — ABNORMAL HIGH (ref 4.8–5.6)

## 2022-03-29 LAB — AFP TUMOR MARKER: AFP, Serum, Tumor Marker: 2.2 ng/mL (ref 0.0–8.4)

## 2022-03-29 LAB — SPECIMEN STATUS REPORT

## 2022-03-29 NOTE — Telephone Encounter (Signed)
Copied from [place] [phone]. Topic: General - Other >> Mar 26, 2022  3:28 PM XIDHWYSH J wrote: Reason for CRM: pt called in to ask if he should be fasting for his upcoming appt?  Please advise

## 2022-03-29 NOTE — Telephone Encounter (Signed)
Is this the right patient? I just saw this patient yesterday

## 2022-04-02 ENCOUNTER — Telehealth: Payer: Self-pay | Admitting: Family Medicine

## 2022-04-02 NOTE — Telephone Encounter (Signed)
Copied from [place] [id number]. Topic: Medicare AWV >> Apr 02, 2022  1:29 PM Jae Dire wrote: Reason for CRM:  No answer unable to leave a message for patient to call back and schedule Medicare Annual Wellness Visit (AWV) in office.   If unable to come into the office for AWV,  please offer to do virtually or by telephone.  Last AWV: 05/18/2020  Please schedule at anytime with [facility] Health Advisor.  30 minute appointment for Virtual or phone 45 minute appointment for in office or Initial virtual/phone  Any questions, please contact me at [phone]

## 2022-04-12 DIAGNOSIS — X32XXXD Exposure to sunlight, subsequent encounter: Secondary | ICD-10-CM | POA: Diagnosis not present

## 2022-04-12 DIAGNOSIS — L570 Actinic keratosis: Secondary | ICD-10-CM | POA: Diagnosis not present

## 2022-04-26 ENCOUNTER — Telehealth: Payer: Self-pay | Admitting: Family Medicine

## 2022-04-26 NOTE — Telephone Encounter (Signed)
Copied from [place] [id number]. Topic: Medicare AWV >> Apr 26, 2022 12:45 PM Jae Dire wrote: Reason for CRM:  Left message for patient to call back and schedule Medicare Annual Wellness Visit (AWV) in office.   If unable to come into the office for AWV,  please offer to do virtually or by telephone.  Last AWV: 05/18/2020  Please schedule at anytime with [facility] Health Advisor.  30 minute appointment for Virtual or phone 45 minute appointment for in office or Initial virtual/phone  Any questions, please contact me at [phone]

## 2022-05-24 ENCOUNTER — Other Ambulatory Visit: Payer: Self-pay | Admitting: Gastroenterology

## 2022-06-01 ENCOUNTER — Other Ambulatory Visit: Payer: Self-pay | Admitting: Family Medicine

## 2022-06-27 ENCOUNTER — Other Ambulatory Visit: Payer: Self-pay | Admitting: Family Medicine

## 2022-06-27 ENCOUNTER — Other Ambulatory Visit: Payer: Self-pay | Admitting: Gastroenterology

## 2022-06-27 DIAGNOSIS — E1165 Type 2 diabetes mellitus with hyperglycemia: Secondary | ICD-10-CM

## 2022-06-27 DIAGNOSIS — K219 Gastro-esophageal reflux disease without esophagitis: Secondary | ICD-10-CM

## 2022-06-28 NOTE — Telephone Encounter (Signed)
Requested Prescriptions  Pending Prescriptions Disp Refills   NOVOLOG FLEXPEN 100 UNIT/ML FlexPen [Pharmacy Med Name: NOVOLOG 100 UNIT/ML FLEXPEN] 45 mL 0    Sig: INJECT 20 UNITS UP TO THREE TIMES A DAY BEFORE MEALS.     Endocrinology:  Diabetes - Insulins Passed - 06/27/2022  9:57 AM      Passed - HBA1C is between 0 and 7.9 and within 180 days    Hemoglobin A1C  Date Value Ref Range Status  09/07/2012 11.3 (H) 4.2 - 6.3 % Final    Comment:    The American Diabetes Association recommends that a primary goal of therapy should be <7% and that physicians should reevaluate the treatment regimen in patients with HbA1c values consistently >8%.    Hgb A1c MFr Bld  Date Value Ref Range Status  03/28/2022 7.2 (H) 4.8 - 5.6 % Final    Comment:             Prediabetes: 5.7 - 6.4          Diabetes: >6.4          Glycemic control for adults with diabetes: <7.0          Passed - Valid encounter within last 6 months    Recent Outpatient Visits           3 months ago Type 2 diabetes mellitus with hyperglycemia, with long-term current use of insulin [facility])   [facility] Birdie Sons, MD   6 months ago Type 2 diabetes mellitus with hyperglycemia, with long-term current use of insulin ([place])   [facility] Thedore Mins, [place], PA-C   10 months ago Type 2 diabetes mellitus with hyperglycemia, with long-term current use of insulin [facility])   [facility] Birdie Sons, MD   1 year ago Abscess of right thigh   [facility] Birdie Sons, MD   1 year ago Gastritis without bleeding, unspecified chronicity, unspecified gastritis type   [facility] Birdie Sons, MD       Future Appointments             In 5 months Ralene Bathe, MD [place]             lisinopril (ZESTRIL) 10 MG tablet [Pharmacy Med Name: LISINOPRIL 10 MG TABLET] 45 tablet 0    Sig: TAKE 1/2 TABLET BY MOUTH DAILY      Cardiovascular:  ACE Inhibitors Failed - 06/27/2022  9:57 AM      Failed - Cr in normal range and within 180 days    Creat  Date Value Ref Range Status  06/10/2017 0.81 0.70 - 1.25 mg/dL Final    Comment:    For patients >35 years of age, the reference limit for Creatinine is approximately 13% higher for people identified as African-American. .    Creatinine, Ser  Date Value Ref Range Status  03/28/2022 0.73 (L) 0.76 - 1.27 mg/dL Final         Failed - K in normal range and within 180 days    Potassium  Date Value Ref Range Status  03/28/2022 5.6 (H) 3.5 - 5.2 mmol/L Final  04/27/2014 4.5 3.5 - 5.1 mmol/L Final         Passed - Patient is not pregnant      Passed - Last BP in normal range    BP Readings from Last 1 Encounters:  03/28/22 117/74  Passed - Valid encounter within last 6 months    Recent Outpatient Visits           3 months ago Type 2 diabetes mellitus with hyperglycemia, with long-term current use of insulin [facility])   [facility] Birdie Sons, MD   6 months ago Type 2 diabetes mellitus with hyperglycemia, with long-term current use of insulin ([place])   [facility] Thedore Mins, [place], PA-C   10 months ago Type 2 diabetes mellitus with hyperglycemia, with long-term current use of insulin [facility])   [facility] Birdie Sons, MD   1 year ago Abscess of right thigh   [facility] Birdie Sons, MD   1 year ago Gastritis without bleeding, unspecified chronicity, unspecified gastritis type   [facility] Birdie Sons, MD       Future Appointments             In 5 months Ralene Bathe, MD [place]             omeprazole ([place]) 40 MG capsule [Pharmacy Med Name: OMEPRAZOLE DR 40 MG CAPSULE] 90 capsule 0    Sig: TAKE [place] DAY     Gastroenterology: Proton Pump Inhibitors Passed - 06/27/2022  9:57 AM      Passed - Valid encounter  within last 12 months    Recent Outpatient Visits           3 months ago Type 2 diabetes mellitus with hyperglycemia, with long-term current use of insulin [facility]   [facility] Birdie Sons, MD   6 months ago Type 2 diabetes mellitus with hyperglycemia, with long-term current use of insulin ([place])   [facility] Thedore Mins, [place], PA-C   10 months ago Type 2 diabetes mellitus with hyperglycemia, with long-term current use of insulin [facility])   [facility] Birdie Sons, MD   1 year ago Abscess of right thigh   [facility] Birdie Sons, MD   1 year ago Gastritis without bleeding, unspecified chronicity, unspecified gastritis type   [facility] Birdie Sons, MD       Future Appointments             In 5 months Ralene Bathe, MD [place] test strip [place] Med Name: [place] TEST STRIP] 100 strip 4    Sig: USE TO [place] TYPE 2 DIABETES     Endocrinology: Diabetes - Testing Supplies Passed - 06/27/2022  9:57 AM      Passed - Valid encounter within last 12 months    Recent Outpatient Visits           3 months ago Type 2 diabetes mellitus with hyperglycemia, with long-term current use of insulin [facility])   [facility] Birdie Sons, MD   6 months ago Type 2 diabetes mellitus with hyperglycemia, with long-term current use of insulin [facility])   [facility] Thedore Mins, [place], PA-C   10 months ago Type 2 diabetes mellitus with hyperglycemia, with long-term current use of insulin [facility])   [facility] Birdie Sons, MD   1 year ago Abscess of right thigh   [facility] Birdie Sons, MD   1 year ago Gastritis without bleeding, unspecified chronicity, unspecified gastritis type  [place], MD       Future  Appointments             In 5 months Ralene Bathe, MD [place]

## 2022-08-23 ENCOUNTER — Other Ambulatory Visit: Payer: Self-pay | Admitting: Family Medicine

## 2022-08-23 DIAGNOSIS — Z794 Long term (current) use of insulin: Secondary | ICD-10-CM

## 2022-08-23 NOTE — Telephone Encounter (Signed)
Requested Prescriptions  Pending Prescriptions Disp Refills   NOVOLOG FLEXPEN 100 UNIT/ML FlexPen [Pharmacy Med Name: NOVOLOG 100 UNIT/ML FLEXPEN] 45 mL 0    Sig: INJECT 20 UNITS UP TO THREE TIMES A DAY BEFORE MEALS.     Endocrinology:  Diabetes - Insulins Passed - 08/23/2022  8:13 AM      Passed - HBA1C is between 0 and 7.9 and within 180 days    Hemoglobin A1C  Date Value Ref Range Status  09/07/2012 11.3 (H) 4.2 - 6.3 % Final    Comment:    The American Diabetes Association recommends that a primary goal of therapy should be <7% and that physicians should reevaluate the treatment regimen in patients with HbA1c values consistently >8%.    Hgb A1c MFr Bld  Date Value Ref Range Status  03/28/2022 7.2 (H) 4.8 - 5.6 % Final    Comment:             Prediabetes: 5.7 - 6.4          Diabetes: >6.4          Glycemic control for adults with diabetes: <7.0          Passed - Valid encounter within last 6 months    Recent Outpatient Visits           4 months ago Type 2 diabetes mellitus with hyperglycemia, with long-term current use of insulin ([place])   [place] Birdie Sons, MD   8 months ago Type 2 diabetes mellitus with hyperglycemia, with long-term current use of insulin ([place])   [place] Thedore Mins, [place], PA-C   1 year ago Type 2 diabetes mellitus with hyperglycemia, with long-term current use of insulin ([place])   [place], Donald E, MD   1 year ago Abscess of right thigh   [place], Donald E, MD   1 year ago Gastritis without bleeding, unspecified chronicity, unspecified gastritis type   [facility] Birdie Sons, MD       Future Appointments             In 3 months Ralene Bathe, MD [place]

## 2022-10-01 ENCOUNTER — Other Ambulatory Visit: Payer: Self-pay

## 2022-10-01 ENCOUNTER — Other Ambulatory Visit: Payer: Self-pay | Admitting: Family Medicine

## 2022-10-01 ENCOUNTER — Telehealth: Payer: Self-pay | Admitting: Family Medicine

## 2022-10-01 MED ORDER — ACCU-CHEK SOFTCLIX LANCETS MISC: 12 refills | Status: DC

## 2022-10-01 NOTE — Telephone Encounter (Signed)
[facility] requesting prescription refill  ACCU- CHEK softclix lancets Please advise

## 2022-10-02 NOTE — Telephone Encounter (Signed)
Requested medication (s) are due for refill today - no  Requested medication (s) are on the active medication list -no  Future visit scheduled -no  Last refill: 10/01/22  Notes to clinic: pharmacy request: Change in supply- needs One touch lancets  Requested Prescriptions  Pending Prescriptions Disp Refills   Accu-Chek Softclix Lancets lancets [Pharmacy Med Name: ACCU-CHEK SOFTCLIX LANCETS] 100 each 12    Sig: Use to check glucose ac and as needed     Endocrinology: Diabetes - Testing Supplies Passed - 10/01/2022  5:53 PM      Passed - Valid encounter within last 12 months    Recent Outpatient Visits           6 months ago Type 2 diabetes mellitus with hyperglycemia, with long-term current use of insulin ([place])   [place] Birdie Sons, MD   9 months ago Type 2 diabetes mellitus with hyperglycemia, with long-term current use of insulin ([place])   [place] Thedore Mins, [place], PA-C   1 year ago Type 2 diabetes mellitus with hyperglycemia, with long-term current use of insulin ([place])   [place], Donald E, MD   1 year ago Abscess of right thigh   [place], Donald E, MD   1 year ago Gastritis without bleeding, unspecified chronicity, unspecified gastritis type   [facility] Birdie Sons, MD       Future Appointments             In 2 months Ralene Bathe, MD [place]               Requested Prescriptions  Pending Prescriptions Disp Refills   Accu-Chek Softclix Lancets lancets [Pharmacy Med Name: ACCU-CHEK SOFTCLIX LANCETS] 100 each 12    Sig: Use to check glucose ac and as needed     Endocrinology: Diabetes - Testing Supplies Passed - 10/01/2022  5:53 PM      Passed - Valid encounter within last 12 months    Recent Outpatient Visits           6 months ago Type 2 diabetes mellitus  with hyperglycemia, with long-term current use of insulin ([place])   [place] Birdie Sons, MD   9 months ago Type 2 diabetes mellitus with hyperglycemia, with long-term current use of insulin ([place])   [place] Thedore Mins, [place], PA-C   1 year ago Type 2 diabetes mellitus with hyperglycemia, with long-term current use of insulin ([place])   [place], Donald E, MD   1 year ago Abscess of right thigh   [place], Donald E, MD   1 year ago Gastritis without bleeding, unspecified chronicity, unspecified gastritis type   [facility] Birdie Sons, MD       Future Appointments             In 2 months Ralene Bathe, MD [place]

## 2022-10-17 ENCOUNTER — Other Ambulatory Visit: Payer: Self-pay | Admitting: Family Medicine

## 2022-10-17 DIAGNOSIS — I10 Essential (primary) hypertension: Secondary | ICD-10-CM

## 2022-10-22 NOTE — Progress Notes (Unsigned)
Patient ID: [name], male   DOB: 1950-12-02, 72 y.o.   MRN: [id number]  Chief Complaint: Umbilical hernia  History of Present Illness [name] is a 72 y.o. male with tender knot at the navel over the last 2 weeks.  Progressively more tender, often having a bulge which seems to spontaneously reduce when recumbent.  Sometimes the pain is severe is a 6-10.  Associated nausea.  Bowel movements appear to be loose and denying constipation.  Had prior laparoscopic cholecystectomy with supra umbilical incision.   Prior history of left inguinal hernia repair, x 2.  Past Medical History Past Medical History:  Diagnosis Date   Alcoholism    Cirrhosis with alcoholism    Diabetes mellitus without complication    Dyspnea    Elevated liver enzymes    Esophageal varices    Gastritis    GERD (gastroesophageal reflux disease)    Hyperlipidemia    Hypertension    Pancreatitis       Past Surgical History:  Procedure Laterality Date   CHOLECYSTECTOMY  2006   COLONOSCOPY WITH ESOPHAGOGASTRODUODENOSCOPY (EGD)     COLONOSCOPY WITH PROPOFOL N/A 11/21/2015   Procedure: COLONOSCOPY WITH PROPOFOL;  Surgeon: Scot Jun, MD;  Location: [facility] ENDOSCOPY;  Service: Endoscopy;  Laterality: N/A;   ESOPHAGOGASTRODUODENOSCOPY (EGD) WITH PROPOFOL N/A 08/21/2019   Procedure: ESOPHAGOGASTRODUODENOSCOPY (EGD) WITH PROPOFOL;  Surgeon: Midge Minium, MD;  Location: [facility] ENDOSCOPY;  Service: Endoscopy;  Laterality: N/A;   HERNIA REPAIR     Inguinal Hernia Repai. Left: 07/28/2013 Dr. Egbert Garibaldi; 2nd repair done 04/30/2014   TONSILLECTOMY      Allergies  Allergen Reactions   Hydrochlorothiazide Other (See Comments)    Reaction:  Hyponatremia    Current Outpatient Medications  Medication Sig Dispense Refill   Accu-Chek Softclix Lancets lancets Use to check glucose ac and as needed 100 each 12   allopurinol (ZYLOPRIM) 100 MG tablet TAKE 1 TABLET BY MOUTH EVERY DAY 90 tablet 4   B-D UF III MINI PEN NEEDLES 31G X  5 MM MISC USE 1 TO CHECK BLOOD SUGAR 3 TIMES DAILY 100 each 4   carvedilol (COREG) 3.125 MG tablet TAKE 1 TABLET (3.125 MG TOTAL) BY MOUTH 2 (TWO) TIMES DAILY. SCHEDULE OFFICE VISIT 60 tablet 0   Cholecalciferol (D3-1000 PO) Take 1,000 mg by mouth daily.     clobetasol (TEMOVATE) 0.05 % external solution SMARTSIG:In Ear(s)     Continuous Blood Gluc Receiver (FREESTYLE LIBRE 14 DAY READER) DEVI 1 Device by Does not apply route every 14 (fourteen) days. for type insulin dependent type 2 diabetes 1 each 12   Continuous Blood Gluc Sensor (FREESTYLE LIBRE 14 DAY SENSOR) MISC Place 1 Device onto the skin every 14 (fourteen) days. for insulin dependent type 2 diabetes 2 each 12   cyclobenzaprine (FLEXERIL) 5 MG tablet Take 1 tablet (5 mg total) by mouth 3 (three) times daily as needed for muscle spasms. 30 tablet 1   fluticasone (CUTIVATE) 0.05 % cream Apply topically.     furosemide (LASIX) 40 MG tablet TAKE 1 TABLET BY MOUTH TWICE A DAY 180 tablet 1   insulin degludec (TRESIBA FLEXTOUCH) 100 UNIT/ML FlexTouch Pen INJECT UP TO 20 UNITS UNDER THE SKIN EVERY DAY AT BEDTIME NEED INSURANCE CARD 15 mL 11   lisinopril (ZESTRIL) 5 MG tablet Take 1 tablet (5 mg total) by mouth daily. 90 tablet 4   magnesium gluconate (MAGONATE) 500 MG tablet Take 500 mg by mouth daily.  Multiple Vitamin (MULTIVITAMIN WITH MINERALS) TABS tablet Take 1 tablet by mouth daily.     naltrexone (DEPADE) 50 MG tablet TAKE 1 TABLET BY MOUTH EVERY DAY 90 tablet 1   niacin 500 MG tablet Take 500 mg by mouth at bedtime.     NOVOLOG FLEXPEN 100 UNIT/ML FlexPen INJECT 20 UNITS UP TO THREE TIMES A DAY BEFORE MEALS. 45 mL 0   omeprazole (PRILOSEC) 40 MG capsule TAKE 1 CAPSULE BY MOUTH EVERY DAY 90 capsule 0   ONETOUCH VERIO test strip USE TO CHECK SUGAR THREE TIMES DAILY FOR INSULIN DEPENDENT TYPE 2 DIABETES 100 strip 4   Potassium Gluconate 550 MG TABS Take 550 mg by mouth daily.     PRILOSEC OTC 20 MG tablet      promethazine  (PHENERGAN) 25 MG tablet Take 1 tablet (25 mg total) by mouth every 8 (eight) hours as needed for nausea or vomiting. 20 tablet 3   pyridOXINE (VITAMIN B-6) 50 MG tablet Take 50 mg by mouth daily.     sertraline (ZOLOFT) 100 MG tablet TAKE 1 TABLET BY MOUTH EVERY DAY 90 tablet 4   silver sulfADIAZINE (SILVADENE) 1 % cream Apply 1 application topically daily. 50 g 0   spironolactone (ALDACTONE) 100 MG tablet TAKE 1 TABLET (100 MG TOTAL) BY MOUTH DAILY. MUST SCHEDULE OFFICE VISIT 30 tablet 0   traMADol (ULTRAM) 50 MG tablet Take 1 tablet (50 mg total) by mouth every 8 (eight) hours as needed. 15 tablet 1   zinc gluconate 50 MG tablet Take 50 mg by mouth daily.     No current facility-administered medications for this visit.    Family History Family History  Problem Relation Age of Onset   Colon polyps Father    Lung cancer Father    Hypertension Father    Dementia Mother    Hypertension Brother    Heart Problems Brother    Hypertension Brother    Diabetes Brother        type 2   Congestive Heart Failure Brother       Social History Social History   Tobacco Use   Smoking status: Never   Smokeless tobacco: Never  Vaping Use   Vaping Use: Never used  Substance Use Topics   Alcohol use: Not Currently    Alcohol/week: 0.0 standard drinks of alcohol    Comment: previous heavy drinker   Drug use: No        Review of Systems  Constitutional: Negative.   HENT: Negative.    Eyes: Negative.   Respiratory: Negative.    Cardiovascular: Negative.   Gastrointestinal:  Positive for abdominal pain, diarrhea, heartburn, nausea and vomiting. Negative for blood in stool, constipation and melena.  Genitourinary: Negative.   Skin: Negative.   Neurological: Negative.   Psychiatric/Behavioral: Negative.       Physical Exam Blood pressure 119/72, pulse 97, temperature 98.5 F (36.9 C), temperature source Oral, height 5\' 5"  (1.651 m), weight 156 lb (70.8 kg), SpO2 97 %. Last Weight   Most recent update: 10/23/2022  1:43 PM    Weight  70.8 kg (156 lb)             CONSTITUTIONAL: Well developed, and nourished, appropriately responsive and aware without distress.   EYES: Sclera non-icteric.   EARS, NOSE, MOUTH AND THROAT:  The oropharynx is clear. Oral mucosa is pink and moist.    Hearing is intact to voice.  NECK: Trachea is midline, and there is no jugular  venous distension.  LYMPH NODES:  Lymph nodes in the neck are not appreciated. RESPIRATORY:  Lungs are clear, and breath sounds are equal bilaterally.  Normal respiratory effort without pathologic use of accessory muscles. CARDIOVASCULAR: Heart is regular in rate and rhythm.   Well perfused.  GI: The abdomen is notable for a small tender bulge at the cephalad aspect of the umbilicus under an adjacent scar, otherwise soft, nontender, and nondistended. There were no palpable masses.  I did not appreciate hepatosplenomegaly. MUSCULOSKELETAL:  Symmetrical muscle tone appreciated in all four extremities.    SKIN: Skin turgor is normal. No pathologic skin lesions appreciated.  NEUROLOGIC:  Motor and sensation appear grossly normal.  Cranial nerves are grossly without defect. PSYCH:  Alert and oriented to person, place and time. Affect is appropriate for situation.  Data Reviewed I have personally reviewed what is currently available of the patient's imaging, recent labs and medical records.   Labs:     Latest Ref Rng & Units 03/28/2022   10:16 AM 09/08/2021   10:23 AM 09/20/2020   11:07 AM  CBC  WBC 3.4 - 10.8 x10E3/uL 7.2  8.6  5.9   Hemoglobin 13.0 - 17.7 g/dL 16.1  09.6  04.5   Hematocrit 37.5 - 51.0 % 40.6  39.5  39.5   Platelets 150 - 450 x10E3/uL 178  221  207       Latest Ref Rng & Units 03/28/2022   10:16 AM 09/08/2021   10:23 AM 09/20/2020   11:07 AM  CMP  Glucose 70 - 99 mg/dL 409  95  811   BUN 8 - 27 mg/dL Creatinine 0.76 - 1.27 mg/dL 9.14  7.82  9.56   Sodium 134 - 144 mmol/L 140  136  136    Potassium 3.5 - 5.2 mmol/L 5.6  5.1  4.8   Chloride 96 - 106 mmol/L 99  95  96   CO2 20 - 29 mmol/L Calcium 8.6 - 10.2 mg/dL 9.5  9.8  9.3   Total Protein 6.0 - 8.5 g/dL 7.1  7.2  7.2   Total Bilirubin 0.0 - 1.2 mg/dL 1.0  0.8  0.7   Alkaline Phos 44 - 121 IU/L 199  193  248   AST 0 - 40 IU/L ALT 0 - 44 IU/L Imaging: Radiological images reviewed:   Within last 24 hrs: No results found.  Assessment    Incisional hernia, reducible Patient Active Problem List   Diagnosis Date Noted   Alcohol dependence in remission 08/15/2021   Closed fracture of heel bone 05/11/2021   Primary localized osteoarthrosis of ankle and foot 05/11/2021   Ankle pain 05/11/2021   Sprain of right ankle 05/11/2021   Gynecomastia, male 02/19/2020   Gastric varices    Hyperuricemia 10/02/2016   Alcohol abuse 03/30/2015   Dermatitis, eczematoid 03/20/2015   GERD (gastroesophageal reflux disease) 03/20/2015   History of Helicobacter pylori infection 03/20/2015   Hearing loss of right ear 03/20/2015   Hernia, inguinal, left 03/20/2015   Supraspinatus sprain and strain 03/20/2015   Cirrhosis 03/20/2015   Scalp laceration 02/28/2015   Rib contusion 02/28/2015   Splenic vein thrombosis    History of portal hypertension    Blood clotting disorder 02/11/2014   Odynophagia 02/10/2014   Chronic pancreatitis 12/08/2013   Abnormal  liver enzymes 12/08/2013   Esophagitis 12/08/2013   Gastric catarrh 12/08/2013   H/O adenomatous polyp of colon 09/23/2012   Esophageal varices 09/09/2012   DDD (degenerative disc disease), cervical 08/17/2011   Vitamin D deficiency 08/19/2009   Type 2 diabetes mellitus with hyperglycemia, with long-term current use of insulin 10/01/2007   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 (Open repair of incisional hernia)  I discussed possibility of incarceration, strangulation, enlargement in size over time, and the need for emergency surgery in the face of these.  Also reviewed the techniques of reduction should incarceration occur, and when unsuccessful to present to the ED.  Also discussed that surgery risks include recurrence which can be up to 30% in the case of complex hernias, use of prosthetic materials (mesh) and the increased risk of infection and the possible need for re-operation and removal of mesh, possibility of post-op SBO or ileus, and the risks of general anesthetic including heart attack, stroke, sudden death or some reaction to anesthetic medications. The patient, and those present, appear to understand the risks, any and all questions were answered to the patient's satisfaction.  No guarantees were ever expressed or implied.   Face-to-face time spent with the patient and accompanying care providers(if present) was 30 minutes, with more than 50% of the time spent counseling, educating, and coordinating care of the patient.    These notes generated with voice recognition software. I apologize for typographical errors.  Campbell Lerner M.D., FACS 10/23/2022, 2:18 PM

## 2022-10-23 ENCOUNTER — Ambulatory Visit: Payer: HMO | Admitting: Surgery

## 2022-10-23 ENCOUNTER — Ambulatory Visit: Payer: Self-pay | Admitting: Surgery

## 2022-10-23 ENCOUNTER — Encounter: Payer: Self-pay | Admitting: Surgery

## 2022-10-23 VITALS — BP 119/72 | HR 97 | Temp 98.5°F | Ht 65.0 in | Wt 156.0 lb

## 2022-10-23 DIAGNOSIS — K432 Incisional hernia without obstruction or gangrene: Secondary | ICD-10-CM

## 2022-10-23 NOTE — Patient Instructions (Addendum)
You have chose to have your hernia repaired. This will be done by Dr. Rodenberg at [facility].  Please see your (blue) Pre-care information that you have been given today. Our surgery scheduler will call you to verify surgery date and to go over information.   You will need to arrange to be out of work for approximately 1-2 weeks and then you may return with a lifting restriction for 4 more weeks. If you have FMLA or Disability paperwork that needs to be filled out, please have your company fax your paperwork to [phone] or you may drop this by either office. This paperwork will be filled out within 3 days after your surgery has been completed.  You may have a bruise in your groin and also swelling and brusing in your testicle area. You may use ice 4-5 times daily for 15-20 minutes each time. Make sure that you place a barrier between you and the ice pack. To decrease the swelling, you may roll up a bath towel and place it vertically in between your thighs with your testicles resting on the towel. You will want to keep this area elevated as much as possible for several days following surgery.     Inguinal Hernia, Adult Muscles help keep everything in the body in its proper place. But if a weak spot in the muscles develops, something can poke through. That is called a hernia. When this happens in the lower part of the belly (abdomen), it is called an inguinal hernia. (It takes its name from a part of the body in this region called the inguinal canal.) A weak spot in the wall of muscles lets some fat or part of the small intestine bulge through. An inguinal hernia can develop at any age. Men get them more often than women. CAUSES  In adults, an inguinal hernia develops over time. It can be triggered by: Suddenly straining the muscles of the lower abdomen. Lifting heavy objects. Straining to have a bowel movement. Difficult bowel movements (constipation) can lead to this. Constant coughing. This may  be caused by smoking or lung disease. Being overweight. Being pregnant. Working at a job that requires long periods of standing or heavy lifting. Having had an inguinal hernia before. One type can be an emergency situation. It is called a strangulated inguinal hernia. It develops if part of the small intestine slips through the weak spot and cannot get back into the abdomen. The blood supply can be cut off. If that happens, part of the intestine may die. This situation requires emergency surgery. SYMPTOMS  Often, a small inguinal hernia has no symptoms. It is found when a healthcare provider does a physical exam. Larger hernias usually have symptoms.  In adults, symptoms may include: A lump in the groin. This is easier to see when the person is standing. It might disappear when lying down. In men, a lump in the scrotum. Pain or burning in the groin. This occurs especially when lifting, straining or coughing. A dull ache or feeling of pressure in the groin. Signs of a strangulated hernia can include: A bulge in the groin that becomes very painful and tender to the touch. A bulge that turns red or purple. Fever, nausea and vomiting. Inability to have a bowel movement or to pass gas. DIAGNOSIS  To decide if you have an inguinal hernia, a healthcare provider will probably do a physical examination. This will include asking questions about any symptoms you have noticed. The healthcare provider might   feel the groin area and ask you to cough. If an inguinal hernia is felt, the healthcare provider may try to slide it back into the abdomen. Usually no other tests are needed. TREATMENT  Treatments can vary. The size of the hernia makes a difference. Options include: Watchful waiting. This is often suggested if the hernia is small and you have had no symptoms. No medical procedure will be done unless symptoms develop. You will need to watch closely for symptoms. If any occur, contact your healthcare  provider right away. Surgery. This is used if the hernia is larger or you have symptoms. Open surgery. This is usually an outpatient procedure (you will not stay overnight in a hospital). An cut (incision) is made through the skin in the groin. The hernia is put back inside the abdomen. The weak area in the muscles is then repaired by herniorrhaphy or hernioplasty. Herniorrhaphy: in this type of surgery, the weak muscles are sewn back together. Hernioplasty: a patch or mesh is used to close the weak area in the abdominal wall. Laparoscopy. In this procedure, a surgeon makes small incisions. A thin tube with a tiny video camera (called a laparoscope) is put into the abdomen. The surgeon repairs the hernia with mesh by looking with the video camera and using two long instruments. HOME CARE INSTRUCTIONS  After surgery to repair an inguinal hernia: You will need to take pain medicine prescribed by your healthcare provider. Follow all directions carefully. You will need to take care of the wound from the incision. Your activity will be restricted for awhile. This will probably include no heavy lifting for several weeks. You also should not do anything too active for a few weeks. When you can return to work will depend on the type of job that you have. During "watchful waiting" periods, you should: Maintain a healthy weight. Eat a diet high in fiber (fruits, vegetables and whole grains). Drink plenty of fluids to avoid constipation. This means drinking enough water and other liquids to keep your urine clear or pale yellow. Do not lift heavy objects. Do not stand for long periods of time. Quit smoking. This should keep you from developing a frequent cough. SEEK MEDICAL CARE IF:  A bulge develops in your groin area. You feel pain, a burning sensation or pressure in the groin. This might be worse if you are lifting or straining. You develop a fever of more than 100.5 F (38.1 C). SEEK IMMEDIATE MEDICAL  CARE IF:  Pain in the groin increases suddenly. A bulge in the groin gets bigger suddenly and does not go down. For men, there is sudden pain in the scrotum. Or, the size of the scrotum increases. A bulge in the groin area becomes red or purple and is painful to touch. You have nausea or vomiting that does not go away. You feel your heart beating much faster than normal. You cannot have a bowel movement or pass gas. You develop a fever of more than 102.0 F (38.9 C).   This information is not intended to replace advice given to you by your health care provider. Make sure you discuss any questions you have with your health care provider.   Document Released: 11/11/2008 Document Revised: 09/17/2011 Document Reviewed: 12/27/2014 Elsevier Interactive Patient Education 2016 [employer] 

## 2022-10-23 NOTE — Addendum Note (Signed)
Addended by: Campbell Lerner on: 10/23/2022 02:26 PM   Modules accepted: Level of Service

## 2022-10-24 ENCOUNTER — Telehealth: Payer: Self-pay | Admitting: Surgery

## 2022-10-24 NOTE — Telephone Encounter (Signed)
Spoke with wife, Drue Flirt, they have been advised of Pre-Admission date/time, and Surgery date at [facility].  Surgery Date: 10/31/22 Preadmission Testing Date: 10/25/22 (phone 8a-1p)  Patient has been made aware to call [phone], between 1-3:00pm the day before surgery, to find out what time to arrive for surgery.

## 2022-10-25 ENCOUNTER — Other Ambulatory Visit: Payer: Self-pay

## 2022-10-25 ENCOUNTER — Encounter
Admission: RE | Admit: 2022-10-25 | Discharge: 2022-10-25 | Disposition: A | Discharge status: Home / Self Care | Payer: HMO | Source: Ambulatory Visit | Attending: Surgery | Admitting: Surgery

## 2022-10-25 DIAGNOSIS — R188 Other ascites: Secondary | ICD-10-CM

## 2022-10-25 DIAGNOSIS — Z794 Long term (current) use of insulin: Secondary | ICD-10-CM

## 2022-10-25 DIAGNOSIS — Z01812 Encounter for preprocedural laboratory examination: Secondary | ICD-10-CM

## 2022-10-25 HISTORY — DX: Malignant (primary) neoplasm, unspecified: C80.1

## 2022-10-25 HISTORY — DX: Unspecified osteoarthritis, unspecified site: M19.90

## 2022-10-25 HISTORY — DX: Depression, unspecified: F32.A

## 2022-10-25 HISTORY — DX: Anxiety disorder, unspecified: F41.9

## 2022-10-25 NOTE — Patient Instructions (Addendum)
Your procedure is scheduled on: 10/31/22 - Wednesday Report to the Registration Desk on the 1st floor of the [facility]. To find out your arrival time, please call [phone] between 1PM - 3PM on: 10/30/22 - Tuesday If your arrival time is 6:00 am, do not arrive before that time as the [facility] entrance doors do not open until 6:00 am.  REMEMBER: Instructions that are not followed completely may result in serious medical risk, up to and including death; or upon the discretion of your surgeon and anesthesiologist your surgery may need to be rescheduled.  Do not eat food or drink any liquids after midnight the night before surgery.  No gum chewing or hard candies.  One week prior to surgery: Stop Anti-inflammatories (NSAIDS) such as Advil, Aleve, Ibuprofen, Motrin, Naproxen, Naprosyn and Aspirin based products such as Excedrin, Goody's Powder, BC Powder.  Stop ANY OVER THE COUNTER supplements until after surgery.  You may however, continue to take Tylenol if needed for pain up until the day of surgery.  Continue taking all prescribed medications with the exception of the following:  Hold Morning dose of NOVOLOG on the day of surgery, may resume with meals. Inject only 1/2 of your scheduled dose of insulin degludec (TRESIBA FLEXTOUCH) on the night before your surgery.  TAKE ONLY THESE MEDICATIONS THE MORNING OF SURGERY WITH A SIP OF WATER:  allopurinol (ZYLOPRIM)  carvedilol (COREG)  3.   omeprazole (PRILOSEC)  (take one the night before and one on the morning of surgery - helps to prevent nausea after surgery.)  No Alcohol for 24 hours before or after surgery.  No Smoking including e-cigarettes for 24 hours before surgery.  No chewable tobacco products for at least 6 hours before surgery.  No nicotine patches on the day of surgery.  Do not use any "recreational" drugs for at least a week (preferably 2 weeks) before your surgery.  Please be advised that the combination of  cocaine and anesthesia may have negative outcomes, up to and including death. If you test positive for cocaine, your surgery will be cancelled.  On the morning of surgery brush your teeth with toothpaste and water, you may rinse your mouth with mouthwash if you wish. Do not swallow any toothpaste or mouthwash.  Use CHG Soap or wipes as directed on instruction sheet.  Do not wear jewelry, make-up, hairpins, clips or nail polish.  Do not wear lotions, powders, or perfumes.   Do not shave body hair from the neck down 48 hours before surgery.  Contact lenses, hearing aids and dentures may not be worn into surgery.  Do not bring valuables to the hospital. [facility] is not responsible for any missing/lost belongings or valuables.   Notify your doctor if there is any change in your medical condition (cold, fever, infection).  Wear comfortable clothing (specific to your surgery type) to the hospital.  After surgery, you can help prevent lung complications by doing breathing exercises.  Take deep breaths and cough every 1-2 hours. Your doctor may order a device called an Incentive Spirometer to help you take deep breaths. When coughing or sneezing, hold a pillow firmly against your incision with both hands. This is called "splinting." Doing this helps protect your incision. It also decreases belly discomfort.  If you are being admitted to the hospital overnight, leave your suitcase in the car. After surgery it may be brought to your room.  In case of increased patient census, it may be necessary for you,  the patient, to continue your postoperative care in the Same Day Surgery department.  If you are being discharged the day of surgery, you will not be allowed to drive home. You will need a responsible individual to drive you home and stay with you for 24 hours after surgery.   If you are taking public transportation, you will need to have a responsible individual with you.  Please call the  Pre-admissions Testing Dept. at [phone] if you have any questions about these instructions.  Surgery Visitation Policy:  Patients having surgery or a procedure may have two visitors.  Children under the age of 59 must have an adult with them who is not the patient.  Inpatient Visitation:    Visiting hours are 7 a.m. to 8 p.m. Up to four visitors are allowed at one time in a patient room. The visitors may rotate out with other people during the day.  One visitor age 54 or older may stay with the patient overnight and must be in the room by 8 p.m.    Preparing for Surgery with CHLORHEXIDINE GLUCONATE (CHG) Soap  Chlorhexidine Gluconate (CHG) Soap  o An antiseptic cleaner that kills germs and bonds with the skin to continue killing germs even after washing  o Used for showering the night before surgery and morning of surgery  Before surgery, you can play an important role by reducing the number of germs on your skin.  CHG (Chlorhexidine gluconate) soap is an antiseptic cleanser which kills germs and bonds with the skin to continue killing germs even after washing.  Please do not use if you have an allergy to CHG or antibacterial soaps. If your skin becomes reddened/irritated stop using the CHG.  1. Shower the NIGHT BEFORE SURGERY and the MORNING OF SURGERY with CHG soap.  2. If you choose to wash your hair, wash your hair first as usual with your normal shampoo.  3. After shampooing, rinse your hair and body thoroughly to remove the shampoo.  4. Use CHG as you would any other liquid soap. You can apply CHG directly to the skin and wash gently with a scrungie or a clean washcloth.  5. Apply the CHG soap to your body only from the neck down. Do not use on open wounds or open sores. Avoid contact with your eyes, ears, mouth, and genitals (private parts). Wash face and genitals (private parts) with your normal soap.  6. Wash thoroughly, paying special attention to the area where  your surgery will be performed.  7. Thoroughly rinse your body with warm water.  8. Do not shower/wash with your normal soap after using and rinsing off the CHG soap.  9. Pat yourself dry with a clean towel.  10. Wear clean pajamas to bed the night before surgery.  12. Place clean sheets on your bed the night of your first shower and do not sleep with pets.  13. Shower again with the CHG soap on the day of surgery prior to arriving at the hospital.  14. Do not apply any deodorants/lotions/powders.  15. Please wear clean clothes to the hospital.

## 2022-10-29 ENCOUNTER — Encounter: Payer: Self-pay | Admitting: Urgent Care

## 2022-10-29 ENCOUNTER — Encounter
Admission: RE | Admit: 2022-10-29 | Discharge: 2022-10-29 | Disposition: A | Discharge status: Home / Self Care | Payer: PPO | Source: Ambulatory Visit | Attending: Surgery | Admitting: Surgery

## 2022-10-29 DIAGNOSIS — R748 Abnormal levels of other serum enzymes: Secondary | ICD-10-CM | POA: Insufficient documentation

## 2022-10-29 DIAGNOSIS — K7460 Unspecified cirrhosis of liver: Secondary | ICD-10-CM | POA: Diagnosis not present

## 2022-10-29 DIAGNOSIS — K432 Incisional hernia without obstruction or gangrene: Secondary | ICD-10-CM | POA: Insufficient documentation

## 2022-10-29 DIAGNOSIS — Z8719 Personal history of other diseases of the digestive system: Secondary | ICD-10-CM | POA: Diagnosis not present

## 2022-10-29 DIAGNOSIS — Z01810 Encounter for preprocedural cardiovascular examination: Secondary | ICD-10-CM | POA: Diagnosis not present

## 2022-10-29 DIAGNOSIS — Z01812 Encounter for preprocedural laboratory examination: Secondary | ICD-10-CM

## 2022-10-29 DIAGNOSIS — E1165 Type 2 diabetes mellitus with hyperglycemia: Secondary | ICD-10-CM | POA: Diagnosis not present

## 2022-10-29 DIAGNOSIS — Z794 Long term (current) use of insulin: Secondary | ICD-10-CM | POA: Diagnosis not present

## 2022-10-29 DIAGNOSIS — K4090 Unilateral inguinal hernia, without obstruction or gangrene, not specified as recurrent: Secondary | ICD-10-CM

## 2022-10-29 DIAGNOSIS — D689 Coagulation defect, unspecified: Secondary | ICD-10-CM | POA: Diagnosis not present

## 2022-10-29 DIAGNOSIS — Z01818 Encounter for other preprocedural examination: Secondary | ICD-10-CM | POA: Diagnosis not present

## 2022-10-29 LAB — COMPREHENSIVE METABOLIC PANEL
ALT: 18 U/L (ref 0–44)
AST: 36 U/L (ref 15–41)
Albumin: 3.3 g/dL — ABNORMAL LOW (ref 3.5–5.0)
Alkaline Phosphatase: 172 U/L — ABNORMAL HIGH (ref 38–126)
Anion gap: 7 (ref 5–15)
BUN: 7 mg/dL — ABNORMAL LOW (ref 8–23)
CO2: 30 mmol/L (ref 22–32)
Calcium: 8.7 mg/dL — ABNORMAL LOW (ref 8.9–10.3)
Chloride: 99 mmol/L (ref 98–111)
Creatinine, Ser: 0.53 mg/dL — ABNORMAL LOW (ref 0.61–1.24)
GFR, Estimated: >60 mL/min (ref >60)
Glucose, Bld: 69 mg/dL — ABNORMAL LOW (ref 70–99)
Potassium: 4 mmol/L (ref 3.5–5.1)
Sodium: 136 mmol/L (ref 135–145)
Total Bilirubin: 2.2 mg/dL — ABNORMAL HIGH (ref 0.3–1.2)
Total Protein: 6.8 g/dL (ref 6.5–8.1)

## 2022-10-29 LAB — PROTIME-INR
INR: 1.5 — ABNORMAL HIGH (ref 0.8–1.2)
Prothrombin Time: 17.7 seconds — ABNORMAL HIGH (ref 11.4–15.2)

## 2022-10-29 LAB — CBC
HCT: 43.5 % (ref 39.0–52.0)
Hemoglobin: 14.9 g/dL (ref 13.0–17.0)
MCH: 33 pg (ref 26.0–34.0)
MCHC: 34.3 g/dL (ref 30.0–36.0)
MCV: 96.5 fL (ref 80.0–100.0)
Platelets: 193 10*3/uL (ref 150–400)
RBC: 4.51 MIL/uL (ref 4.22–5.81)
RDW: 13.9 % (ref 11.5–15.5)
WBC: 6.6 10*3/uL (ref 4.0–10.5)
nRBC: 0 % (ref 0.0–0.2)

## 2022-10-30 ENCOUNTER — Telehealth: Payer: Self-pay | Admitting: Gastroenterology

## 2022-10-30 ENCOUNTER — Ambulatory Visit
Admission: RE | Admit: 2022-10-30 | Discharge: 2022-10-30 | Disposition: A | Discharge status: Home / Self Care | Payer: PPO | Source: Ambulatory Visit | Attending: Surgery | Admitting: Surgery

## 2022-10-30 ENCOUNTER — Telehealth: Payer: Self-pay | Admitting: Surgery

## 2022-10-30 DIAGNOSIS — R188 Other ascites: Secondary | ICD-10-CM | POA: Diagnosis not present

## 2022-10-30 DIAGNOSIS — K7460 Unspecified cirrhosis of liver: Secondary | ICD-10-CM | POA: Diagnosis not present

## 2022-10-30 MED ORDER — IOHEXOL 300 MG/ML  SOLN
85.0000 mL | Freq: Once | INTRAMUSCULAR | Status: AC | PRN
  Administered 2022-10-30: 85 mL via INTRAVENOUS

## 2022-10-30 NOTE — Telephone Encounter (Signed)
Surgery scheduled for 10/31/22 is cancelled per Dr Claudine Mouton. Dr. Claudine Mouton did contact the patient and explained to him reason for cancelling.

## 2022-10-30 NOTE — Telephone Encounter (Signed)
Pt called with swelling in lower abdomin and pain for the last few days  please return call

## 2022-10-31 ENCOUNTER — Encounter: Admission: RE | Payer: Self-pay | Source: Home / Self Care

## 2022-10-31 ENCOUNTER — Ambulatory Visit: Admission: RE | Admit: 2022-10-31 | Payer: PPO | Source: Home / Self Care | Admitting: Surgery

## 2022-10-31 SURGERY — REPAIR, HERNIA, VENTRAL
Anesthesia: General

## 2022-10-31 NOTE — Telephone Encounter (Signed)
Patient called again expecting a call back very upset

## 2022-11-02 NOTE — Telephone Encounter (Signed)
Pt not available, spoke to wife Drue Flirt ok per HIPAA  Appt scheduled for 4/30

## 2022-11-06 ENCOUNTER — Encounter: Payer: Self-pay | Admitting: Gastroenterology

## 2022-11-06 ENCOUNTER — Ambulatory Visit (INDEPENDENT_AMBULATORY_CARE_PROVIDER_SITE_OTHER): Payer: PPO | Admitting: Gastroenterology

## 2022-11-06 DIAGNOSIS — K7030 Alcoholic cirrhosis of liver without ascites: Secondary | ICD-10-CM

## 2022-11-06 MED ORDER — FUROSEMIDE 20 MG PO TABS: 60.0000 mg | ORAL_TABLET | Freq: Every day | ORAL | 5 refills | Status: DC

## 2022-11-06 NOTE — Progress Notes (Signed)
Gastroenterology Consultation  Referring Provider:     Malva Limes, MD Primary Care Physician:  Malva Limes, MD Primary Gastroenterologist:  Dr. Servando Snare     Reason for Consultation:     Cirrhosis with abdominal swelling        HPI:   [name] is a 72 y.o. y/o male referred for consultation & management of cirrhosis with abdominal swelling by Dr. Malva Limes, MD. This patient comes in today after seeing me back in 2021 with a history of alcoholic cirrhosis.  At that time the patient had stopped the alcohol abuse.  The patient had a normal alpha-fetoprotein in September and a CT scan earlier this month that showed:  IMPRESSION: 1. Liver cirrhosis with splenomegaly and moderate volume of ascites. 2. 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 Colon wall thickening versus under distension of the hepatic flexure and transverse colon. 4. Aortic atherosclerosis.  The patient had contacted our office with worsening abdominal swelling. The patient reports that he does not watch his salt intake and he loves his salt.  There is no report of any black stools or bloody stools.  Past Medical History:  Diagnosis Date   Alcoholism (HCC)    Anxiety    Arthritis    Cancer (HCC)    Cirrhosis with alcoholism (HCC)    Depression    Diabetes mellitus without complication (HCC)    Dyspnea    Elevated liver enzymes    Esophageal varices (HCC)    Gastritis    GERD (gastroesophageal reflux disease)    Hyperlipidemia    Hypertension    Pancreatitis     Past Surgical History:  Procedure Laterality Date   CHOLECYSTECTOMY  2006   COLONOSCOPY WITH ESOPHAGOGASTRODUODENOSCOPY (EGD)     COLONOSCOPY WITH PROPOFOL N/A 11/21/2015   Procedure: COLONOSCOPY WITH PROPOFOL;  Surgeon: Scot Jun, MD;  Location[facility] ENDOSCOPY;  Service: Endoscopy;  Laterality: N/A;   ESOPHAGOGASTRODUODENOSCOPY (EGD) WITH PROPOFOL N/A 08/21/2019   Procedure:  ESOPHAGOGASTRODUODENOSCOPY (EGD) WITH PROPOFOL;  Surgeon: Midge Minium, MD;  Location: [facility] ENDOSCOPY;  Service: Endoscopy;  Laterality: N/A;   HERNIA REPAIR     Inguinal Hernia Repai. Left: 07/28/2013 Dr. Egbert Garibaldi; 2nd repair done 04/30/2014   TONSILLECTOMY      Prior to Admission medications   Medication Sig Start Date End Date Taking? Authorizing Provider  Accu-Chek Softclix Lancets lancets Use to check glucose ac and as needed 10/01/22   Malva Limes, MD  acetaminophen (TYLENOL) 500 MG tablet Take 1,000 mg by mouth every 6 (six) hours as needed.    [provider]  allopurinol (ZYLOPRIM) 100 MG tablet TAKE 1 TABLET BY MOUTH EVERY DAY 06/01/22   Malva Limes, MD  B-D UF III MINI PEN NEEDLES 31G X 5 MM MISC USE 1 TO CHECK BLOOD SUGAR 3 TIMES DAILY 03/09/22   Malva Limes, MD  carvedilol (COREG) 3.125 MG tablet TAKE 1 TABLET (3.125 MG TOTAL) BY MOUTH 2 (TWO) TIMES DAILY. SCHEDULE OFFICE VISIT 12/19/21   Midge Minium, MD  Cholecalciferol (D3-1000 PO) Take 1,000 mg by mouth daily.    [provider]  Continuous Blood Gluc Receiver (FREESTYLE LIBRE 14 DAY READER) DEVI 1 Device by Does not apply route every 14 (fourteen) days. for type insulin dependent type 2 diabetes 07/05/21   Malva Limes, MD  Continuous Blood Gluc Sensor (FREESTYLE LIBRE 14 DAY SENSOR) MISC Place 1 Device onto the skin every 14 (fourteen) days.  for insulin dependent type 2 diabetes 07/05/21   Malva Limes, MD  cyclobenzaprine (FLEXERIL) 5 MG tablet Take 1 tablet (5 mg total) by mouth 3 (three) times daily as needed for muscle spasms. 01/26/20   Malva Limes, MD  fluticasone (CUTIVATE) 0.05 % cream Apply topically. 12/22/21   [provider]  furosemide (LASIX) 40 MG tablet TAKE 1 TABLET BY MOUTH TWICE A DAY Patient taking differently: as needed. 07/04/21   Midge Minium, MD  insulin degludec (TRESIBA FLEXTOUCH) 100 UNIT/ML FlexTouch Pen INJECT UP TO 20 UNITS UNDER THE SKIN EVERY DAY AT  BEDTIME NEED INSURANCE CARD 10/23/21   Malva Limes, MD  lisinopril (ZESTRIL) 5 MG tablet Take 1 tablet (5 mg total) by mouth daily. 10/17/22 01/10/24  Malva Limes, MD  magnesium gluconate (MAGONATE) 500 MG tablet Take 500 mg by mouth daily.     [provider]  Multiple Vitamin (MULTIVITAMIN WITH MINERALS) TABS tablet Take 1 tablet by mouth daily.    [provider]  naltrexone (DEPADE) 50 MG tablet TAKE 1 TABLET BY MOUTH EVERY DAY 08/25/21   Malva Limes, MD  niacin 500 MG tablet Take 500 mg by mouth at bedtime.    [provider]  NOVOLOG FLEXPEN 100 UNIT/ML FlexPen INJECT 20 UNITS UP TO THREE TIMES A DAY BEFORE MEALS. 08/23/22   Malva Limes, MD  omeprazole (PRILOSEC) 40 MG capsule TAKE 1 CAPSULE BY MOUTH EVERY DAY 06/28/22   Malva Limes, MD  [facility] VERIO test strip USE TO CHECK SUGAR THREE TIMES DAILY FOR INSULIN DEPENDENT TYPE 2 DIABETES 06/28/22   Malva Limes, MD  Potassium Gluconate 550 MG TABS Take 550 mg by mouth daily.    [provider]  PRILOSEC OTC 20 MG tablet  08/03/21   [provider]  promethazine (PHENERGAN) 25 MG tablet Take 1 tablet (25 mg total) by mouth every 8 (eight) hours as needed for nausea or vomiting. 12/25/21   Ok Edwards, Lillia Abed, PA-C  pyridOXINE (VITAMIN B-6) 50 MG tablet Take 50 mg by mouth daily.    [provider]  sertraline (ZOLOFT) 100 MG tablet TAKE 1 TABLET BY MOUTH EVERY DAY 06/01/22   Malva Limes, MD  silver sulfADIAZINE (SILVADENE) 1 % cream Apply 1 application topically daily. 03/10/21   Malva Limes, MD  spironolactone (ALDACTONE) 100 MG tablet TAKE 1 TABLET (100 MG TOTAL) BY MOUTH DAILY. MUST SCHEDULE OFFICE VISIT 02/07/22   Midge Minium, MD  traMADol (ULTRAM) 50 MG tablet Take 1 tablet (50 mg total) by mouth every 8 (eight) hours as needed. 03/28/22   Malva Limes, MD  zinc gluconate 50 MG tablet Take 50 mg by mouth daily.    [provider]    Family History   Problem Relation Age of Onset   Colon polyps Father    Lung cancer Father    Hypertension Father    Dementia Mother    Hypertension Brother    Heart Problems Brother    Hypertension Brother    Diabetes Brother        type 2   Congestive Heart Failure Brother      Social History   Tobacco Use   Smoking status: Never   Smokeless tobacco: Never  Vaping Use   Vaping Use: Never used  Substance Use Topics   Alcohol use: Not Currently    Alcohol/week: 0.0 standard drinks of alcohol    Comment: previous heavy drinker   Drug  use: No    Allergies as of 11/06/2022 - Review Complete 10/30/2022  Allergen Reaction Noted   Hydrochlorothiazide Other (See Comments) 02/28/2015    Review of Systems:    All systems reviewed and negative except where noted in HPI.   Physical Exam:  There were no vitals taken for this visit. No LMP for male patient. General:   Alert,  Well-developed, well-nourished, pleasant and cooperative in NAD Head:  Normocephalic and atraumatic. Eyes:  Sclera clear, no icterus.   Conjunctiva pink. Ears:  Normal auditory acuity. Neck:  Supple; no masses or thyromegaly. Lungs:  Respirations even and unlabored.  Clear throughout to auscultation.   No wheezes, crackles, or rhonchi. No acute distress. Heart:  Regular rate and rhythm; no murmurs, clicks, rubs, or gallops. Abdomen:  Normal bowel sounds.  No bruits.  Soft, non-tender and non-distended without masses, hepatosplenomegaly or hernias noted.  No guarding or rebound tenderness.  Negative Carnett sign.   Rectal:  Deferred.  Pulses:  Normal pulses noted. Extremities:  No clubbing or edema.  No cyanosis. Neurologic:  Alert and oriented x3;  grossly normal neurologically. Skin:  Intact without significant lesions or rashes.  No jaundice. Lymph Nodes:  No significant cervical adenopathy. Psych:  Alert and cooperative. Normal mood and affect.  Imaging Studies: CT Abdomen Pelvis W Contrast  Result Date:  11/03/2022 CLINICAL DATA:  Cirrhosis periumbilical swelling nausea EXAM: CT ABDOMEN AND PELVIS WITH CONTRAST TECHNIQUE: Multidetector CT imaging of the abdomen and pelvis was performed using the standard protocol following bolus administration of intravenous contrast. RADIATION DOSE REDUCTION: This exam was performed according to the departmental dose-optimization program which includes automated exposure control, adjustment of the mA and/or kV according to patient size and/or use of iterative reconstruction technique. CONTRAST:  85mL OMNIPAQUE IOHEXOL 300 MG/ML  SOLN COMPARISON:  Ultrasound 02/21/2021, CT 12/08/2014 FINDINGS: Lower chest: Lung bases demonstrate linear atelectasis or scarring at the bases. No acute airspace disease. Ascending aortic diameter up to 4 cm. Gynecomastia. Coronary vascular calcification. Hepatobiliary: Liver cirrhosis. Status post cholecystectomy. No biliary dilatation Pancreas: Atrophic. No pancreatic ductal dilatation or surrounding inflammatory changes. Spleen: Enlarged measuring 15.4 cm Adrenals/Urinary Tract: Adrenal glands are normal. Kidneys show no hydronephrosis. The bladder is unremarkable. Stomach/Bowel: The stomach is nonenlarged. No dilated small bowel. Colon wall thickening versus under distended hepatic flexure and transverse colon. Negative appendix. Vascular/Lymphatic: Moderate aortic atherosclerosis. No aneurysm. Subcentimeter retroperitoneal lymph nodes. Subcentimeter peripancreatic nodes. 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 Reproductive: Prostate slightly enlarged Other: No free air. Moderate volume of ascites. Right inguinal hernia containing fluid and fat Musculoskeletal: No acute osseous abnormality. Grade 1 anterolisthesis L4 on L5 with advanced degenerative changes IMPRESSION: 1. Liver cirrhosis with splenomegaly and moderate volume of ascites. 2. 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  Colon wall thickening versus under distension of the hepatic flexure and transverse colon. 4. Aortic atherosclerosis. Aortic Atherosclerosis (ICD10-I70.0). Electronically Signed   By: Jasmine Pang M.D.   On: 11/03/2022 03:23    Assessment and Plan:   KAMARI BUCH is a 73 y.o. y/o male who comes in with a history of cirrhosis with worsening abdominal distention and ascites.  The patient is on Lasix 40 mg twice a day and Aldactone 100 mg twice a day.  The patient will have his Lasix increased to 60 mg twice a day and have his renal function checked in 1 week's time.  The patient has also been told to avoid his  salt intake.  The patient has been explained the plan and agrees with it.    Midge Minium, MD. Clementeen Graham    Note: This dictation was prepared with Dragon dictation along with smaller phrase technology. Any transcriptional errors that result from this process are unintentional.

## 2022-11-07 DIAGNOSIS — E119 Type 2 diabetes mellitus without complications: Secondary | ICD-10-CM | POA: Diagnosis not present

## 2022-11-07 DIAGNOSIS — H35362 Drusen (degenerative) of macula, left eye: Secondary | ICD-10-CM | POA: Diagnosis not present

## 2022-11-07 DIAGNOSIS — H2513 Age-related nuclear cataract, bilateral: Secondary | ICD-10-CM | POA: Diagnosis not present

## 2022-11-07 LAB — HM DIABETES EYE EXAM

## 2022-11-09 ENCOUNTER — Telehealth: Payer: Self-pay | Admitting: Family Medicine

## 2022-11-09 NOTE — Telephone Encounter (Signed)
Called patient to schedule Medicare Annual Wellness Visit (AWV). Left message for patient to call back and schedule Medicare Annual Wellness Visit (AWV).  Last date of AWV: 05/18/2020   Please schedule an AWVS appointment at any time with [facility] ANNUAL WELLNESS VISIT.  If any questions, please contact me at [phone].    Thank you,  [facility] Support [facility] Direct dial  [phone]

## 2022-11-12 NOTE — Progress Notes (Signed)
Diabetic Eye Exam Abstracted. Please see media.

## 2022-11-26 ENCOUNTER — Other Ambulatory Visit: Payer: Self-pay | Admitting: Family Medicine

## 2022-11-26 DIAGNOSIS — E1165 Type 2 diabetes mellitus with hyperglycemia: Secondary | ICD-10-CM

## 2022-11-27 NOTE — Telephone Encounter (Signed)
Requested medication (s) are due for refill today: yes  Requested medication (s) are on the active medication list: yes  Last refill:  03/09/22 #100/4  Future visit scheduled: no  Notes to clinic:  DX Code Needed     Requested Prescriptions  Pending Prescriptions Disp Refills   B-D UF III MINI PEN NEEDLES 31G X 5 MM MISC [Pharmacy Med Name: BD UF MINI PEN NEEDLE [id number]] 100 each 4    Sig: USE 1 TO CHECK BLOOD SUGAR 3 TIMES DAILY     Endocrinology: Diabetes - Testing Supplies Passed - 11/26/2022  3:05 PM      Passed - Valid encounter within last 12 months    Recent Outpatient Visits           8 months ago Type 2 diabetes mellitus with hyperglycemia, with long-term current use of insulin (HCC)   [facility] Malva Limes, MD   11 months ago Type 2 diabetes mellitus with hyperglycemia, with long-term current use of insulin (HCC)   [place] [facility] Ok Edwards, [place], PA-C   1 year ago Type 2 diabetes mellitus with hyperglycemia, with long-term current use of insulin (HCC)   [place] [facility] Malva Limes, MD   1 year ago Abscess of right thigh   [place] [facility] Malva Limes, MD   1 year ago Gastritis without bleeding, unspecified chronicity, unspecified gastritis type   [facility] Malva Limes, MD       Future Appointments             In 3 weeks Deirdre Evener, MD [facility]

## 2022-11-28 ENCOUNTER — Telehealth: Payer: Self-pay | Admitting: Gastroenterology

## 2022-11-28 NOTE — Telephone Encounter (Signed)
Pt left vmm to schedule colonoscopy with Dr. Servando Snare please return call

## 2022-11-30 NOTE — Telephone Encounter (Signed)
Message left for patient to return my call.  

## 2022-12-01 ENCOUNTER — Other Ambulatory Visit: Payer: Self-pay | Admitting: Family Medicine

## 2022-12-01 DIAGNOSIS — K219 Gastro-esophageal reflux disease without esophagitis: Secondary | ICD-10-CM

## 2022-12-04 NOTE — Telephone Encounter (Signed)
Requested Prescriptions  Pending Prescriptions Disp Refills   omeprazole (PRILOSEC) 40 MG capsule [Pharmacy Med Name: OMEPRAZOLE DR 40 MG CAPSULE] 90 capsule 1    Sig: TAKE 1 CAPSULE BY MOUTH EVERY DAY     Gastroenterology: Proton Pump Inhibitors Passed - 12/01/2022  8:53 AM      Passed - Valid encounter within last 12 months    Recent Outpatient Visits           8 months ago Type 2 diabetes mellitus with hyperglycemia, with long-term current use of insulin (HCC)   [place] [facility] Malva Limes, MD   11 months ago Type 2 diabetes mellitus with hyperglycemia, with long-term current use of insulin (HCC)   [place] [facility] Ok Edwards, [place], PA-C   1 year ago Type 2 diabetes mellitus with hyperglycemia, with long-term current use of insulin (HCC)   [place] [facility] Malva Limes, MD   1 year ago Abscess of right thigh   [place] [facility] Malva Limes, MD   1 year ago Gastritis without bleeding, unspecified chronicity, unspecified gastritis type   [place] Malva Limes, MD       Future Appointments             In 1 week Fisher, Demetrios Isaacs, MD [facility]   In 2 weeks Deirdre Evener, MD [facility]

## 2022-12-10 ENCOUNTER — Other Ambulatory Visit: Payer: Self-pay | Admitting: Family Medicine

## 2022-12-10 DIAGNOSIS — M479 Spondylosis, unspecified: Secondary | ICD-10-CM

## 2022-12-10 DIAGNOSIS — M4722 Other spondylosis with radiculopathy, cervical region: Secondary | ICD-10-CM

## 2022-12-10 NOTE — Telephone Encounter (Signed)
Please advise 

## 2022-12-10 NOTE — Telephone Encounter (Signed)
Message left for patient to return my call.  

## 2022-12-12 ENCOUNTER — Other Ambulatory Visit: Payer: Self-pay | Admitting: Family Medicine

## 2022-12-12 DIAGNOSIS — E1165 Type 2 diabetes mellitus with hyperglycemia: Secondary | ICD-10-CM

## 2022-12-12 NOTE — Telephone Encounter (Signed)
Requested medication (s) are due for refill today:   Yes  Requested medication (s) are on the active medication list:   Yes  Future visit scheduled:   Yes 6/7/ with Dr. Sherrie Mustache   Last ordered: 10/23/2021 15 ml, 11 refills  Returned because A1C is due so unable to refill.     Requested Prescriptions  Pending Prescriptions Disp Refills   TRESIBA FLEXTOUCH 100 UNIT/ML FlexTouch Pen [Pharmacy Med Name: TRESIBA FLEXTOUCH 100 UNIT/ML]  11    Sig: INJECT UP TO 20 UNITS UNDER THE SKIN EVERY DAY AT BEDTIME     Endocrinology:  Diabetes - Insulins Failed - 12/12/2022  2:34 AM      Failed - HBA1C is between 0 and 7.9 and within 180 days    Hemoglobin A1C  Date Value Ref Range Status  09/07/2012 11.3 (H) 4.2 - 6.3 % Final    Comment:    The American Diabetes Association recommends that a primary goal of therapy should be <7% and that physicians should reevaluate the treatment regimen in patients with HbA1c values consistently >8%.    Hgb A1c MFr Bld  Date Value Ref Range Status  03/28/2022 7.2 (H) 4.8 - 5.6 % Final    Comment:             Prediabetes: 5.7 - 6.4          Diabetes: >6.4          Glycemic control for adults with diabetes: <7.0          Failed - Valid encounter within last 6 months    Recent Outpatient Visits           8 months ago Type 2 diabetes mellitus with hyperglycemia, with long-term current use of insulin (HCC)   [place] [facility] Malva Limes, MD   11 months ago Type 2 diabetes mellitus with hyperglycemia, with long-term current use of insulin (HCC)   [place] [facility] Ok Edwards, [place], PA-C   1 year ago Type 2 diabetes mellitus with hyperglycemia, with long-term current use of insulin (HCC)   [place] [facility] Malva Limes, MD   1 year ago Abscess of right thigh   [place] [facility] Malva Limes, MD   1 year ago Gastritis without bleeding, unspecified chronicity,  unspecified gastritis type   [facility] Malva Limes, MD       Future Appointments             In 2 days Fisher, Demetrios Isaacs, MD [facility]   In 1 week Deirdre Evener, MD [facility] [place] [facility]

## 2022-12-14 ENCOUNTER — Ambulatory Visit (INDEPENDENT_AMBULATORY_CARE_PROVIDER_SITE_OTHER): Payer: HMO | Admitting: Family Medicine

## 2022-12-14 ENCOUNTER — Encounter: Payer: Self-pay | Admitting: Family Medicine

## 2022-12-14 VITALS — BP 112/75 | HR 65 | Wt 151.5 lb

## 2022-12-14 DIAGNOSIS — E1165 Type 2 diabetes mellitus with hyperglycemia: Secondary | ICD-10-CM

## 2022-12-14 DIAGNOSIS — Z794 Long term (current) use of insulin: Secondary | ICD-10-CM | POA: Diagnosis not present

## 2022-12-14 DIAGNOSIS — F1021 Alcohol dependence, in remission: Secondary | ICD-10-CM | POA: Diagnosis not present

## 2022-12-14 DIAGNOSIS — Z1211 Encounter for screening for malignant neoplasm of colon: Secondary | ICD-10-CM | POA: Diagnosis not present

## 2022-12-14 DIAGNOSIS — Z860100 Personal history of colon polyps, unspecified: Secondary | ICD-10-CM

## 2022-12-14 DIAGNOSIS — R413 Other amnesia: Secondary | ICD-10-CM | POA: Diagnosis not present

## 2022-12-14 DIAGNOSIS — K7031 Alcoholic cirrhosis of liver with ascites: Secondary | ICD-10-CM

## 2022-12-14 MED ORDER — HEPATITIS A-HEP B RECOMB VAC 720-20 ELU-MCG/ML IM SUSY
1.0000 mL | PREFILLED_SYRINGE | Freq: Once | INTRAMUSCULAR | 0 refills | Status: AC
Start: 2022-12-14 — End: 2022-12-14

## 2022-12-14 NOTE — Patient Instructions (Signed)
Please review the attached list of medications and notify my office if there are any errors.   You are due for the second dose of the Twinrix vaccine to prevent hepatitis which you can get from [facility].

## 2022-12-14 NOTE — Progress Notes (Addendum)
I,Sha'taria Tyson,acting as a Neurosurgeon for Mila Merry, MD.,have documented all relevant documentation on the behalf of Mila Merry, MD,as directed by  Mila Merry, MD   Established patient visit   Patient: [name]   DOB: 02-May-1951   72 y.o. Male  MRN: [id number] Visit Date: 12/14/2022  Today's healthcare provider: Mila Merry, MD    Subjective    HPI   Discussed the use of AI scribe software for clinical note transcription with the patient, who gave verbal consent to proceed. -Colonoscopy: ok to order Diabetes Mellitus Type II, Follow-up  Lab Results  Component Value Date   HGBA1C 7.2 (H) 03/28/2022   HGBA1C 5.7 (A) 12/25/2021   HGBA1C 7.9 (A) 08/15/2021   Wt Readings from Last 3 Encounters:  10/23/22 156 lb (70.8 kg)  03/28/22 155 lb (70.3 kg)  12/25/21 153 lb 9.6 oz (69.7 kg)    Symptoms: No fatigue No foot ulcerations  No appetite changes No nausea  No paresthesia of the feet  No polydipsia  No polyuria No visual disturbances   No vomiting      Episodes of hypoglycemia? Yes light headed   Current insulin regiment: Evaristo Bury every day usually 20 units and sliding scale novolog.   Pertinent Labs: Lab Results  Component Value Date   CHOL 130 09/08/2021   HDL 42 09/08/2021   LDLCALC 75 09/08/2021   TRIG 64 09/08/2021   CHOLHDL 3.1 09/08/2021   Lab Results  Component Value Date   NA 136 10/29/2022   K 4.0 10/29/2022   CREATININE 0.53 (L) 10/29/2022   GFRNONAA >60 10/29/2022   LABMICR 4.4 03/28/2022   MICRALBCREAT 4 03/28/2022     The patient has been managing his blood sugar levels by adjusting his insulin dosage. He has reduced his nighttime insulin from 20 units to 8 units due to episodes of hypoglycemia in the morning, with blood sugar levels sometimes dropping to the 40s. After the adjustment, his morning blood sugar level was 74. ---------------------------------------------------------------------------------------------------   He  also complains of a swelling in the abdomen, suspected to be a hernia or fluid accumulation. The swelling is visible but does not cause pain. He also reports constant back pain due to degenerative disc disease and arthritis.  The patient also reports memory issues and is concerned about potential dementia or Alzheimer's disease. He has been taking multivitamins, including vitamin B6 and magnesium, to manage previous issues with kidney stones.  The patient has stopped drinking alcohol for several months due to inability to control intake and a desire to improve his overall health. He also mentions a potential need for a colonoscopy, as his last one was seven years ago.        Medications: Outpatient Medications Prior to Visit  Medication Sig   Accu-Chek Softclix Lancets lancets Use to check glucose ac and as needed   acetaminophen (TYLENOL) 500 MG tablet Take 1,000 mg by mouth every 6 (six) hours as needed.   allopurinol (ZYLOPRIM) 100 MG tablet TAKE 1 TABLET BY MOUTH EVERY DAY   B-D UF III MINI PEN NEEDLES 31G X 5 MM MISC USE 1 TO CHECK BLOOD SUGAR 3 TIMES DAILY   carvedilol (COREG) 3.125 MG tablet TAKE 1 TABLET (3.125 MG TOTAL) BY MOUTH 2 (TWO) TIMES DAILY. SCHEDULE OFFICE VISIT   Cholecalciferol (D3-1000 PO) Take 1,000 mg by mouth daily.   Continuous Blood Gluc Receiver (FREESTYLE LIBRE 14 DAY READER) DEVI 1 Device by Does not apply route every 14 (fourteen)  days. for type insulin dependent type 2 diabetes   Continuous Blood Gluc Sensor (FREESTYLE LIBRE 14 DAY SENSOR) MISC Place 1 Device onto the skin every 14 (fourteen) days. for insulin dependent type 2 diabetes   cyclobenzaprine (FLEXERIL) 5 MG tablet Take 1 tablet (5 mg total) by mouth 3 (three) times daily as needed for muscle spasms.   fluticasone (CUTIVATE) 0.05 % cream Apply topically.   furosemide (LASIX) 20 MG tablet Take 3 tablets (60 mg total) by mouth daily.   insulin degludec (TRESIBA FLEXTOUCH) 100 UNIT/ML FlexTouch Pen INJECT  UP TO 20 UNITS UNDER THE SKIN EVERY DAY AT BEDTIME   lisinopril (ZESTRIL) 5 MG tablet Take 1 tablet (5 mg total) by mouth daily.   magnesium gluconate (MAGONATE) 500 MG tablet Take 500 mg by mouth daily.    Multiple Vitamin (MULTIVITAMIN WITH MINERALS) TABS tablet Take 1 tablet by mouth daily.   naltrexone (DEPADE) 50 MG tablet TAKE 1 TABLET BY MOUTH EVERY DAY   niacin 500 MG tablet Take 500 mg by mouth at bedtime.   NOVOLOG FLEXPEN 100 UNIT/ML FlexPen INJECT 20 UNITS UP TO THREE TIMES A DAY BEFORE MEALS.   omeprazole (PRILOSEC) 40 MG capsule TAKE 1 CAPSULE BY MOUTH EVERY DAY   ONETOUCH VERIO test strip USE TO CHECK SUGAR THREE TIMES DAILY FOR INSULIN DEPENDENT TYPE 2 DIABETES   Potassium Gluconate 550 MG TABS Take 550 mg by mouth daily.   PRILOSEC OTC 20 MG tablet    promethazine (PHENERGAN) 25 MG tablet Take 1 tablet (25 mg total) by mouth every 8 (eight) hours as needed for nausea or vomiting.   pyridOXINE (VITAMIN B-6) 50 MG tablet Take 50 mg by mouth daily.   sertraline (ZOLOFT) 100 MG tablet TAKE 1 TABLET BY MOUTH EVERY DAY   silver sulfADIAZINE (SILVADENE) 1 % cream Apply 1 application topically daily.   spironolactone (ALDACTONE) 100 MG tablet TAKE 1 TABLET (100 MG TOTAL) BY MOUTH DAILY. MUST SCHEDULE OFFICE VISIT   traMADol (ULTRAM) 50 MG tablet TAKE 1 TABLET BY MOUTH EVERY 8 HOURS AS NEEDED.   zinc gluconate 50 MG tablet Take 50 mg by mouth daily.   No facility-administered medications prior to visit.    Review of Systems  Constitutional:  Negative for appetite change, chills and fever.  Respiratory:  Negative for chest tightness, shortness of breath and wheezing.   Cardiovascular:  Negative for chest pain and palpitations.  Gastrointestinal:  Negative for abdominal pain, nausea and vomiting.       Objective    BP 112/75 (BP Location: Left Arm, Patient Position: Sitting, Cuff Size: Normal)   Pulse 65   Wt 151 lb 8 oz (68.7 kg)   SpO2 100%   BMI 25.21 kg/m    Physical  Exam   General: Appearance:    Well developed, well nourished male in no acute distress  Eyes:    PERRL, conjunctiva/corneas clear, EOM's intact       Lungs:     Clear to auscultation bilaterally, respirations unlabored  Heart:    Normal heart rate. Normal rhythm. No murmurs, rubs, or gallops.    Abd:   Mild fluid distention c/with minimal ascites, no discreet masses or tenderness.   MS:   All extremities are intact.    Neurologic:   Awake, alert, oriented x 3. No apparent focal neurological defect.         Assessment & Plan     Assessment and Plan    Abdominal Hernia/Fluid Accumulation: No  pain reported. Furosemide increased to 60mg  daily by Dr. Servando Snare, with noted improvement in size of the hernia/fluid accumulation. -Continue Furosemide 60mg  daily. -Check kidney function and electrolytes today due to increased Furosemide dose.  Chronic Back Pain: Reports constant pain due to degenerative disc disease and arthritis. -No changes to current management plan discussed.  Type 2 Diabetes Mellitus: Reports hypoglycemic episodes with blood glucose in the 40s overnight. Adjusted insulin dose to 8 units of long-acting insulin at night, resulting in morning blood glucose of 74. -Continue current insulin regimen. -Check A1C today.  Memory Concerns: Reports increasing forgetfulness. No specific cognitive assessment performed during the visit. -Check thiamine and vitamin B12 levels today. -Check thyroid function today.  Incomplete Hepatitis Vaccination: Received one dose of the hepatitis vaccine series at [facility] a couple of years ago. -Prescribe remaining two doses of the hepatitis vaccine series to be administered at [facility].  Colonoscopy: Last colonoscopy was in 2017. -He is to discuss with Dr. Servando Snare about colonoscopy scheduling.     Alcohol dependence in remission (HCC) - Vitamin B1 - B12 and Folate Panel      The entirety of the information documented in the History of Present Illness, Review  of Systems and Physical Exam were personally obtained by me. Portions of this information were initially documented by the CMA and reviewed by me for thoroughness and accuracy.     Mila Merry, MD  [facility] [phone] (phone) [phone] (fax)  [facility]

## 2022-12-15 LAB — RENAL FUNCTION PANEL
Creatinine, Ser: 0.62 mg/dL — ABNORMAL LOW (ref 0.76–1.27)
Glucose: 283 mg/dL — ABNORMAL HIGH (ref 70–99)
Sodium: 135 mmol/L (ref 134–144)

## 2022-12-15 LAB — HEMOGLOBIN A1C: Hgb A1c MFr Bld: 6.4 % — ABNORMAL HIGH (ref 4.8–5.6)

## 2022-12-19 ENCOUNTER — Ambulatory Visit: Payer: PPO | Admitting: Dermatology

## 2022-12-19 VITALS — BP 131/74

## 2022-12-19 DIAGNOSIS — Z5111 Encounter for antineoplastic chemotherapy: Secondary | ICD-10-CM

## 2022-12-19 DIAGNOSIS — X32XXXA Exposure to sunlight, initial encounter: Secondary | ICD-10-CM

## 2022-12-19 DIAGNOSIS — W908XXA Exposure to other nonionizing radiation, initial encounter: Secondary | ICD-10-CM

## 2022-12-19 DIAGNOSIS — D229 Melanocytic nevi, unspecified: Secondary | ICD-10-CM

## 2022-12-19 DIAGNOSIS — L570 Actinic keratosis: Secondary | ICD-10-CM

## 2022-12-19 DIAGNOSIS — L820 Inflamed seborrheic keratosis: Secondary | ICD-10-CM

## 2022-12-19 DIAGNOSIS — L821 Other seborrheic keratosis: Secondary | ICD-10-CM | POA: Diagnosis not present

## 2022-12-19 DIAGNOSIS — L814 Other melanin hyperpigmentation: Secondary | ICD-10-CM | POA: Diagnosis not present

## 2022-12-19 DIAGNOSIS — L578 Other skin changes due to chronic exposure to nonionizing radiation: Secondary | ICD-10-CM | POA: Diagnosis not present

## 2022-12-19 DIAGNOSIS — D692 Other nonthrombocytopenic purpura: Secondary | ICD-10-CM | POA: Diagnosis not present

## 2022-12-19 DIAGNOSIS — Z79899 Other long term (current) drug therapy: Secondary | ICD-10-CM

## 2022-12-19 DIAGNOSIS — D1801 Hemangioma of skin and subcutaneous tissue: Secondary | ICD-10-CM | POA: Diagnosis not present

## 2022-12-19 DIAGNOSIS — Z1283 Encounter for screening for malignant neoplasm of skin: Secondary | ICD-10-CM | POA: Diagnosis not present

## 2022-12-19 DIAGNOSIS — L590 Erythema ab igne [dermatitis ab igne]: Secondary | ICD-10-CM | POA: Diagnosis not present

## 2022-12-19 DIAGNOSIS — Z872 Personal history of diseases of the skin and subcutaneous tissue: Secondary | ICD-10-CM

## 2022-12-19 DIAGNOSIS — Z7189 Other specified counseling: Secondary | ICD-10-CM

## 2022-12-19 LAB — RENAL FUNCTION PANEL
Albumin: 3.4 g/dL — ABNORMAL LOW (ref 3.8–4.8)
BUN/Creatinine Ratio: 11 (ref 10–24)
BUN: 7 mg/dL — ABNORMAL LOW (ref 8–27)
CO2: 27 mmol/L (ref 20–29)
Calcium: 8.6 mg/dL (ref 8.6–10.2)
Chloride: 95 mmol/L — ABNORMAL LOW (ref 96–106)
Phosphorus: 4 mg/dL (ref 2.8–4.1)
Potassium: 5.1 mmol/L (ref 3.5–5.2)
eGFR: 102 mL/min/{1.73_m2} (ref >59)

## 2022-12-19 LAB — PROTIME-INR
INR: 1.3 — ABNORMAL HIGH (ref 0.9–1.2)
Prothrombin Time: 13.8 s — ABNORMAL HIGH (ref 9.1–12.0)

## 2022-12-19 LAB — VITAMIN B1: Thiamine: 126.9 nmol/L (ref 66.5–200.0)

## 2022-12-19 LAB — B12 AND FOLATE PANEL
Folate: 12.8 ng/mL (ref >3.0)
Vitamin B-12: 1631 pg/mL — ABNORMAL HIGH (ref 232–1245)

## 2022-12-19 LAB — HEMOGLOBIN A1C: Est. average glucose Bld gHb Est-mCnc: 137 mg/dL

## 2022-12-19 LAB — TSH: TSH: 3.68 u[IU]/mL (ref 0.450–4.500)

## 2022-12-19 NOTE — Patient Instructions (Addendum)
Cryotherapy Aftercare  Wash gently with soap and water everyday.   Apply Vaseline and Band-Aid daily until healed.   Apply 5-fluorouracil/calcipotriene cream twice a day for 7 days to affected areas including scalp (start in July). Prescription sent to [facility]. Patient advised they will receive an email to purchase the medication online and have it sent to their home. Patient provided with handout reviewing treatment course and side effects and advised to call or message [place] on MyChart with any concerns.   Due to recent changes in healthcare laws, you may see results of your pathology and/or laboratory studies on MyChart before the doctors have had a chance to review them. We understand that in some cases there may be results that are confusing or concerning to you. Please understand that not all results are received at the same time and often the doctors may need to interpret multiple results in order to provide you with the best plan of care or course of treatment. Therefore, we ask that you please give [place] 2 business days to thoroughly review all your results before contacting the office for clarification. Should we see a critical lab result, you will be contacted sooner.   If You Need Anything After Your Visit  If you have any questions or concerns for your doctor, please call our main line at [phone] and press option 4 to reach your doctor's medical assistant. If no one answers, please leave a voicemail as directed and we will return your call as soon as possible. Messages left after 4 pm will be answered the following business day.   You may also send [place] a message via MyChart. We typically respond to MyChart messages within 1-2 business days.  For prescription refills, please ask your pharmacy to contact our office. Our fax number is [phone].  If you have an urgent issue when the clinic is closed that cannot wait until the next business day, you can page  your doctor at the number below.    Please note that while we do our best to be available for urgent issues outside of office hours, we are not available 24/7.   If you have an urgent issue and are unable to reach [place], you may choose to seek medical care at your doctor's office, retail clinic, urgent care center, or emergency room.  If you have a medical emergency, please immediately call 911 or go to the emergency department.  Pager Numbers  - Dr. Gwen Pounds: [phone]  - Dr. Neale Burly: [phone]  - Dr. Roseanne Reno: [phone]  In the event of inclement weather, please call our main line at [phone] for an update on the status of any delays or closures.  Dermatology Medication Tips: Please keep the boxes that topical medications come in in order to help keep track of the instructions about where and how to use these. Pharmacies typically print the medication instructions only on the boxes and not directly on the medication tubes.   If your medication is too expensive, please contact our office at [phone] option 4 or send [place] a message through MyChart.   We are unable to tell what your co-pay for medications will be in advance as this is different depending on your insurance coverage. However, we may be able to find a substitute medication at lower cost or fill out paperwork to get insurance to cover a needed medication.   If a prior authorization is required to get your medication covered by your insurance company, please allow  [place] 1-2 business days to complete this process.  Drug prices often vary depending on where the prescription is filled and some pharmacies may offer cheaper prices.  The website www.goodrx.com contains coupons for medications through different pharmacies. The prices here do not account for what the cost may be with help from insurance (it may be cheaper with your insurance), but the website can give you the price if you did not use any insurance.  - You can  print the associated coupon and take it with your prescription to the pharmacy.  - You may also stop by our office during regular business hours and pick up a GoodRx coupon card.  - If you need your prescription sent electronically to a different pharmacy, notify our office through [facility] or by phone at [phone] option 4.     Si Usted Necesita Algo Despus de Su Visita  Tambin puede enviarnos un mensaje a travs de Pharmacist, community. Por lo general respondemos a los mensajes de MyChart en el transcurso de 1 a 2 das hbiles.  Para renovar recetas, por favor pida a su farmacia que se ponga en contacto con nuestra oficina. Harland Dingwall de fax es Guy [phone].  Si tiene un asunto urgente cuando la clnica est cerrada y que no puede esperar hasta el siguiente da hbil, puede llamar/localizar a su doctor(a) al nmero que aparece a continuacin.   Por favor, tenga en cuenta que aunque hacemos todo lo posible para estar disponibles para asuntos urgentes fuera del horario de [place], no estamos disponibles las 24 horas del da, los 7 das de la [place].   Si tiene un problema urgente y no puede comunicarse con nosotros, puede optar por buscar atencin mdica  en el consultorio de su doctor(a), en una clnica privada, en un centro de atencin urgente o en una sala de emergencias.  Si tiene Engineering geologist, por favor llame inmediatamente al 911 o vaya a la sala de emergencias.  Nmeros de bper  - Dr. Nehemiah Massed: [phone]  - Dra. Moye: [phone]  - Dra. Nicole Kindred: [phone]  En caso de inclemencias del [place], por favor llame a Johnsie Kindred principal al [phone] para una actualizacin sobre el [place] de cualquier retraso o cierre.  Consejos para la medicacin en dermatologa: Por favor, guarde las cajas en las que vienen los medicamentos de uso tpico para ayudarle a seguir las instrucciones sobre dnde y cmo usarlos. Las farmacias generalmente imprimen las  instrucciones del medicamento slo en las cajas y no directamente en los tubos del [place].   Si su medicamento es muy caro, por favor, pngase en contacto con Zigmund Daniel llamando al [phone] y presione la opcin 4 o envenos un mensaje a travs de Pharmacist, community.   No podemos decirle cul ser su copago por los medicamentos por adelantado ya que esto es diferente dependiendo de la cobertura de su seguro. Sin embargo, es posible que podamos encontrar un medicamento sustituto a Electrical engineer un formulario para que el seguro cubra el medicamento que se considera necesario.   Si se requiere una autorizacin previa para que su compaa de seguros [place] su medicamento, por favor permtanos de 1 a 2 das hbiles para completar este proceso.  Los precios de los medicamentos varan con frecuencia dependiendo del Environmental consultant de dnde se surte la receta y alguna farmacias pueden ofrecer precios ms baratos.  El sitio web www.goodrx.com tiene cupones para medicamentos de Airline pilot. Los precios aqu no tienen en cuenta lo que podra  lo que podra costar con la ayuda del seguro (puede ser ms barato con su seguro), pero el sitio web puede darle el precio si no utiliz ningn seguro.  - Puede imprimir el cupn correspondiente y llevarlo con su receta a la farmacia.  - Tambin puede pasar por nuestra oficina durante el horario de atencin regular y recoger una tarjeta de cupones de GoodRx.  - Si necesita que su receta se enve electrnicamente a una farmacia diferente, informe a nuestra oficina a travs de MyChart de [place] o por telfono llamando al [phone] y presione la opcin 4.  

## 2022-12-19 NOTE — Progress Notes (Signed)
New Patient Visit   Subjective  [name] is a 72 y.o. male who presents for the following: Skin Cancer Screening and Full Body Skin Exam - History of AK  The patient presents for Total-Body Skin Exam (TBSE) for skin cancer screening and mole check. The patient has spots, moles and lesions to be evaluated, some may be new or changing and the patient has concerns that these could be cancer.    The following portions of the chart were reviewed this encounter and updated as appropriate: medications, allergies, medical history  Review of Systems:  No other skin or systemic complaints except as noted in HPI or Assessment and Plan.  Objective  Well appearing patient in no apparent distress; mood and affect are within normal limits.  A full examination was performed including scalp, head, eyes, ears, nose, lips, neck, chest, axillae, abdomen, back, buttocks, bilateral upper extremities, bilateral lower extremities, hands, feet, fingers, toes, fingernails, and toenails. All findings within normal limits unless otherwise noted below.   Relevant physical exam findings are noted in the Assessment and Plan.  Scalp, face (20) Erythematous thin papules/macules with gritty scale.   Scalp (5) Erythematous stuck-on, waxy papule or plaque    Assessment & Plan   ERYTHEMA AB IGNE Exam: mottled, reticulated pink, reddish, violaceous and/or brown patches  Erythema ab igne is a benign condition caused by heat damage to the skin. The color changes are often permanent, but avoiding overheating the skin can prevent worsening of the spots.   Treatment Plan: Recommend avoiding heating pad and electric blanket use or standing too close to space heaters.      Purpura - Chronic; persistent and recurrent.  Treatable, but not curable. - Violaceous macules and patches - Benign - Related to trauma, age, sun damage and/or use of blood thinners, chronic use of topical and/or oral steroids - Observe -  Can use OTC arnica containing moisturizer such as Dermend Bruise Formula if desired - Call for worsening or other concerns   LENTIGINES, SEBORRHEIC KERATOSES, HEMANGIOMAS - Benign normal skin lesions - Benign-appearing - Call for any changes  MELANOCYTIC NEVI - Tan-brown and/or pink-flesh-colored symmetric macules and papules - Benign appearing on exam today - Observation - Call clinic for new or changing moles - Recommend daily use of broad spectrum spf 30+ sunscreen to sun-exposed areas.   ACTINIC DAMAGE - Chronic condition, secondary to cumulative UV/sun exposure - diffuse scaly erythematous macules with underlying dyspigmentation - Recommend daily broad spectrum sunscreen SPF 30+ to sun-exposed areas, reapply every 2 hours as needed.  - Staying in the shade or wearing long sleeves, sun glasses (UVA+UVB protection) and wide brim hats (4-inch brim around the entire circumference of the hat) are also recommended for sun protection.  - Call for new or changing lesions.  SKIN CANCER SCREENING PERFORMED TODAY.    AK (actinic keratosis) (20) Scalp, face  ACTINIC DAMAGE WITH PRECANCEROUS ACTINIC KERATOSES Counseling for Topical Chemotherapy Management: Patient exhibits: - Severe, confluent actinic changes with pre-cancerous actinic keratoses that is secondary to cumulative UV radiation exposure over time - Condition that is severe; chronic, not at goal. - diffuse scaly erythematous macules and papules with underlying dyspigmentation - Discussed Prescription "Field Treatment" topical Chemotherapy for Severe, Chronic Confluent Actinic Changes with Pre-Cancerous Actinic Keratoses Field treatment involves treatment of an entire area of skin that has confluent Actinic Changes (Sun/ Ultraviolet light damage) and PreCancerous Actinic Keratoses by method of PhotoDynamic Therapy (PDT) and/or prescription Topical Chemotherapy agents such as  5-fluorouracil, 5-fluorouracil/calcipotriene, and/or  imiquimod.  The purpose is to decrease the number of clinically evident and subclinical PreCancerous lesions to prevent progression to development of skin cancer by chemically destroying early precancer changes that may or may not be visible.  It has been shown to reduce the risk of developing skin cancer in the treated area. As a result of treatment, redness, scaling, crusting, and open sores may occur during treatment course. One or more than one of these methods may be used and may have to be used several times to control, suppress and eliminate the PreCancerous changes. Discussed treatment course, expected reaction, and possible side effects. - Recommend daily broad spectrum sunscreen SPF 30+ to sun-exposed areas, reapply every 2 hours as needed.  - Staying in the shade or wearing long sleeves, sun glasses (UVA+UVB protection) and wide brim hats (4-inch brim around the entire circumference of the hat) are also recommended. - Call for new or changing lesions.  Apply 5-fluorouracil/calcipotriene cream twice a day for 7 days to affected areas including scalp (start in July). Prescription sent to [facility]. Patient advised they will receive an email to purchase the medication online and have it sent to their home. Patient provided with handout reviewing treatment course and side effects and advised to call or message [place] on MyChart with any concerns.   Destruction of lesion - Scalp, face Complexity: simple   Destruction method: cryotherapy   Informed consent: discussed and consent obtained   Timeout:  patient name, date of birth, surgical site, and procedure verified Lesion destroyed using liquid nitrogen: Yes   Region frozen until ice ball extended beyond lesion: Yes   Outcome: patient tolerated procedure well with no complications   Post-procedure details: wound care instructions given    Inflamed seborrheic keratosis (5) Scalp  Symptomatic, irritating, patient would  like treated.  Benign-appearing.  Call clinic for new or changing lesions.   Prior to procedure, discussed risks of blister formation, small wound, skin dyspigmentation, or rare scar following treatment. Recommend Vaseline ointment to treated areas while healing.   Destruction of lesion - Scalp Complexity: simple   Destruction method: cryotherapy   Informed consent: discussed and consent obtained   Timeout:  patient name, date of birth, surgical site, and procedure verified Lesion destroyed using liquid nitrogen: Yes   Region frozen until ice ball extended beyond lesion: Yes   Outcome: patient tolerated procedure well with no complications   Post-procedure details: wound care instructions given      Return in about 6 months (around 06/20/2023) for AK follow up.  I, Joanie Coddington, CMA, am acting as scribe for Armida Sans, MD .   Documentation: I have reviewed the above documentation for accuracy and completeness, and I agree with the above.  Armida Sans, MD

## 2022-12-20 ENCOUNTER — Other Ambulatory Visit: Payer: Self-pay | Admitting: Gastroenterology

## 2022-12-20 NOTE — Telephone Encounter (Signed)
This was filled 12/09/2021 with no refills... I did not see any mention of in recent OV note... Please advise if okay to refill

## 2022-12-21 ENCOUNTER — Encounter: Payer: Self-pay | Admitting: Dermatology

## 2022-12-21 MED ORDER — CARVEDILOL 3.125 MG PO TABS: 3.1250 mg | ORAL_TABLET | Freq: Two times a day (BID) | ORAL | 2 refills | Status: DC

## 2022-12-26 NOTE — Addendum Note (Signed)
Addended by: Mila Merry E on: 12/26/2022 12:26 PM   Modules accepted: Orders

## 2022-12-27 ENCOUNTER — Other Ambulatory Visit: Payer: Self-pay

## 2022-12-27 ENCOUNTER — Telehealth: Payer: Self-pay

## 2022-12-27 DIAGNOSIS — Z86010 Personal history of colonic polyps: Secondary | ICD-10-CM

## 2022-12-27 MED ORDER — NA SULFATE-K SULFATE-MG SULF 17.5-3.13-1.6 GM/177ML PO SOLN: 1.0000 | Freq: Once | ORAL | 0 refills | Status: AC

## 2022-12-27 NOTE — Telephone Encounter (Signed)
Gastroenterology Pre-Procedure Review  Request Date: 01/23/23 Requesting Physician: Dr. Servando Snare  PATIENT REVIEW QUESTIONS: The patient responded to the following health history questions as indicated:    1. Are you having any GI issues? no 2. Do you have a personal history of Polyps? yes (last colonoscopy was with Dr. Mechele Collin 11/21/2015) 3. Do you have a family history of Colon Cancer or Polyps? yes (father colon polyps) 4. Diabetes Mellitus? yes (takes Novolog and Lantus) 5. Joint replacements in the past 12 months?no 6. Major health problems in the past 3 months?no 7. Any artificial heart valves, MVP, or defibrillator?no    MEDICATIONS & ALLERGIES:    Patient reports the following regarding taking any anticoagulation/antiplatelet therapy:   Plavix, Coumadin, Eliquis, Xarelto, Lovenox, Pradaxa, Brilinta, or Effient? no Aspirin? no  Patient confirms/reports the following medications:  Current Outpatient Medications  Medication Sig Dispense Refill   Accu-Chek Softclix Lancets lancets Use to check glucose ac and as needed 100 each 12   acetaminophen (TYLENOL) 500 MG tablet Take 1,000 mg by mouth every 6 (six) hours as needed.     allopurinol (ZYLOPRIM) 100 MG tablet TAKE 1 TABLET BY MOUTH EVERY DAY 90 tablet 4   B-D UF III MINI PEN NEEDLES 31G X 5 MM MISC USE 1 TO CHECK BLOOD SUGAR 3 TIMES DAILY 100 each 4   carvedilol (COREG) 3.125 MG tablet Take 1 tablet (3.125 mg total) by mouth 2 (two) times daily. 60 tablet 2   Continuous Blood Gluc Receiver (FREESTYLE LIBRE 14 DAY READER) DEVI 1 Device by Does not apply route every 14 (fourteen) days. for type insulin dependent type 2 diabetes 1 each 12   Continuous Blood Gluc Sensor (FREESTYLE LIBRE 14 DAY SENSOR) MISC Place 1 Device onto the skin every 14 (fourteen) days. for insulin dependent type 2 diabetes 2 each 12   cyclobenzaprine (FLEXERIL) 5 MG tablet Take 1 tablet (5 mg total) by mouth 3 (three) times daily as needed for muscle spasms. 30  tablet 1   fluticasone (CUTIVATE) 0.05 % cream Apply topically.     furosemide (LASIX) 20 MG tablet Take 3 tablets (60 mg total) by mouth daily. 90 tablet 5   insulin degludec (TRESIBA FLEXTOUCH) 100 UNIT/ML FlexTouch Pen INJECT UP TO 20 UNITS UNDER THE SKIN EVERY DAY AT BEDTIME 3 mL 0   lisinopril (ZESTRIL) 5 MG tablet Take 1 tablet (5 mg total) by mouth daily. 90 tablet 4   magnesium gluconate (MAGONATE) 500 MG tablet Take 500 mg by mouth daily.      Multiple Vitamin (MULTIVITAMIN WITH MINERALS) TABS tablet Take 1 tablet by mouth daily.     naltrexone (DEPADE) 50 MG tablet TAKE 1 TABLET BY MOUTH EVERY DAY 90 tablet 1   niacin 500 MG tablet Take 500 mg by mouth at bedtime.     NOVOLOG FLEXPEN 100 UNIT/ML FlexPen INJECT 20 UNITS UP TO THREE TIMES A DAY BEFORE MEALS. 45 mL 0   omeprazole (PRILOSEC) 40 MG capsule TAKE 1 CAPSULE BY MOUTH EVERY DAY 90 capsule 1   ONETOUCH VERIO test strip USE TO CHECK SUGAR THREE TIMES DAILY FOR INSULIN DEPENDENT TYPE 2 DIABETES 100 strip 4   Potassium Gluconate 550 MG TABS Take 550 mg by mouth daily.     PRILOSEC OTC 20 MG tablet      promethazine (PHENERGAN) 25 MG tablet Take 1 tablet (25 mg total) by mouth every 8 (eight) hours as needed for nausea or vomiting. 20 tablet 3   pyridOXINE (VITAMIN  B-6) 50 MG tablet Take 50 mg by mouth daily.     sertraline (ZOLOFT) 100 MG tablet TAKE 1 TABLET BY MOUTH EVERY DAY 90 tablet 4   silver sulfADIAZINE (SILVADENE) 1 % cream Apply 1 application topically daily. 50 g 0   spironolactone (ALDACTONE) 100 MG tablet TAKE 1 TABLET (100 MG TOTAL) BY MOUTH DAILY. MUST SCHEDULE OFFICE VISIT 30 tablet 0   traMADol (ULTRAM) 50 MG tablet TAKE 1 TABLET BY MOUTH EVERY 8 HOURS AS NEEDED. 15 tablet 2   zinc gluconate 50 MG tablet Take 50 mg by mouth daily.     No current facility-administered medications for this visit.    Patient confirms/reports the following allergies:  Allergies  Allergen Reactions   Hydrochlorothiazide Other (See  Comments)    Reaction:  Hyponatremia    No orders of the defined types were placed in this encounter.   AUTHORIZATION INFORMATION Primary Insurance: 1D#: Group #:  Secondary Insurance: 1D#: Group #:  SCHEDULE INFORMATION: Date: 01/23/23 Time: Location: [facility]

## 2023-01-04 ENCOUNTER — Other Ambulatory Visit: Payer: Self-pay | Admitting: Family Medicine

## 2023-01-04 DIAGNOSIS — E1165 Type 2 diabetes mellitus with hyperglycemia: Secondary | ICD-10-CM

## 2023-01-09 DIAGNOSIS — M25571 Pain in right ankle and joints of right foot: Secondary | ICD-10-CM | POA: Diagnosis not present

## 2023-01-09 DIAGNOSIS — E119 Type 2 diabetes mellitus without complications: Secondary | ICD-10-CM | POA: Diagnosis not present

## 2023-01-09 DIAGNOSIS — K219 Gastro-esophageal reflux disease without esophagitis: Secondary | ICD-10-CM | POA: Diagnosis not present

## 2023-01-14 ENCOUNTER — Other Ambulatory Visit: Payer: Self-pay | Admitting: Physician Assistant

## 2023-01-14 DIAGNOSIS — R110 Nausea: Secondary | ICD-10-CM

## 2023-01-15 NOTE — Telephone Encounter (Signed)
Requested medication (s) are due for refill today: yes  Requested medication (s) are on the active medication list: yes  Last refill:  12/25/21  Future visit scheduled: yes  Notes to clinic:  Unable to refill per protocol, cannot delegate.      Requested Prescriptions  Pending Prescriptions Disp Refills   promethazine (PHENERGAN) 25 MG tablet [Pharmacy Med Name: PROMETHAZINE 25 MG TABLET] 20 tablet 3    Sig: TAKE 1 TABLET BY MOUTH EVERY 8 HOURS AS NEEDED FOR NAUSEA OR VOMITING.     Not Delegated - Gastroenterology: Antiemetics Failed - 01/14/2023  1:30 PM      Failed - This refill cannot be delegated      Passed - Valid encounter within last 6 months    Recent Outpatient Visits           1 month ago Type 2 diabetes mellitus with hyperglycemia, with long-term current use of insulin (HCC)   [facility] Malva Limes, MD   9 months ago Type 2 diabetes mellitus with hyperglycemia, with long-term current use of insulin (HCC)   [facility] Malva Limes, MD   1 year ago Type 2 diabetes mellitus with hyperglycemia, with long-term current use of insulin [facility])   [facility] Alfredia Ferguson, PA-C   1 year ago Type 2 diabetes mellitus with hyperglycemia, with long-term current use of insulin [facility])   [facility] Malva Limes, MD   1 year ago Abscess of right thigh   [facility] Malva Limes, MD       Future Appointments             In 3 months Fisher, Demetrios Isaacs, MD [facility]   In 5 months Deirdre Evener, MD [facility]

## 2023-01-17 ENCOUNTER — Telehealth: Payer: Self-pay | Admitting: Gastroenterology

## 2023-01-17 NOTE — Telephone Encounter (Signed)
Left message on voicemail   The patient's most recent renal function looked good.  Have the patient continue on the Lasix 60 mg a day and increase the Aldactone from 100 mg a day to 100 mg twice a day

## 2023-01-17 NOTE — Telephone Encounter (Signed)
Pt has problem with stomach swelling may need to cancel procedure for 01/23/2023 please return call

## 2023-01-17 NOTE — Telephone Encounter (Signed)
Last renal function checked 12/14/2022... Please advise if pt needs to f/u in office, adjust diuretics, or schedule paracentesis

## 2023-01-21 ENCOUNTER — Telehealth: Payer: Self-pay

## 2023-01-21 NOTE — Telephone Encounter (Signed)
Pt [name] at our office about cancelling his colonoscopy with Dr Servando Snare, I called patient back and left him a message letting him know he has called the wrong office that Dr Servando Snare does not work here so he would still need to reach out to that office in particular and cancel his appt

## 2023-01-22 ENCOUNTER — Telehealth: Payer: Self-pay

## 2023-01-22 NOTE — Telephone Encounter (Signed)
Pt cancelled procedure due to cost

## 2023-01-23 ENCOUNTER — Ambulatory Visit: Admission: RE | Admit: 2023-01-23 | Payer: PPO | Source: Home / Self Care | Admitting: Gastroenterology

## 2023-01-23 ENCOUNTER — Encounter: Admission: RE | Payer: Self-pay | Source: Home / Self Care

## 2023-01-23 SURGERY — COLONOSCOPY WITH PROPOFOL
Anesthesia: General

## 2023-01-30 ENCOUNTER — Telehealth: Payer: Self-pay

## 2023-01-30 DIAGNOSIS — Z1211 Encounter for screening for malignant neoplasm of colon: Secondary | ICD-10-CM

## 2023-01-30 NOTE — Telephone Encounter (Signed)
Copied from CRM [id number]. Topic: Referral - Status >> Jan 30, 2023 10:35 AM Macon Large wrote: Reason for CRM: Pt stated he needs a referral sent to [facility] for colonoscopy and GI exam. [phone]

## 2023-01-30 NOTE — Telephone Encounter (Signed)
Referral was already done and he was scheduled for a colonoscopy this month which appears to have been cancelled. He needs to call their office to reschedule

## 2023-01-30 NOTE — Addendum Note (Signed)
Addended by: Hyacinth Meeker on: 01/30/2023 03:50 PM   Modules accepted: Orders

## 2023-02-11 ENCOUNTER — Other Ambulatory Visit: Payer: Self-pay | Admitting: Gastroenterology

## 2023-02-11 ENCOUNTER — Ambulatory Visit: Payer: Self-pay

## 2023-02-11 NOTE — Telephone Encounter (Signed)
Chief Complaint: Abdominal pain Symptoms: Abdominal pain and swelling, back pain Frequency: comes and goes  Pertinent Negatives: Patient denies chest pain, nausea, vomiting, blood in urine or stool Disposition: [] ED /[] Urgent Care (no appt availability in office) / [x] Appointment(In office/virtual)/ []  [facility]/ [] Home Care/ [] Refused Recommended Disposition /[] [place] [facility]/ []  Follow-up with PCP Additional Notes: Patient states that he has had abdominal pain over the past 3-4 weeks that has gradually gotten worse. Patient stated that he also has abdominal swelling and lower back pain. Patient states this happen about 5 years ago and he had to have fluid drained from the abdominal area. Advised patient that he needs to be evaluated. Patient is agreeable. Patient has been scheduled tomorrow at 1120. Advised patient is symptoms get worse he will need to go to the ED or UC. Patient verbalized understanding. Reason for Disposition  [1] MILD pain (e.g., does not interfere with normal activities) AND [2] pain comes and goes (cramps) [3] present > 48 hours  (Exception: This same abdominal pain is a chronic symptom recurrent or ongoing AND present > 4 weeks.)  Answer Assessment - Initial Assessment Questions 1. LOCATION: "Where does it hurt?"      My abdominal area 2. RADIATION: "Does the pain shoot anywhere else?" (e.g., chest, back)     Lower back pain 3. ONSET: "When did the pain begin?" (Minutes, hours or days ago)      About 3-4 weeks  4. SUDDEN: "Gradual or sudden onset?"     gradual 5. PATTERN "Does the pain come and go, or is it constant?"    - If it comes and goes: "How long does it last?" "Do you have pain now?"     (Note: Comes and goes means the pain is intermittent. It goes away completely between bouts.)    - If constant: "Is it getting better, staying the same, or getting worse?"      (Note: Constant means the pain never goes away completely; most serious pain  is constant and gets worse.)      Comes and goes  6. SEVERITY: "How bad is the pain?"  (e.g., Scale 1-10; mild, moderate, or severe)    - MILD (1-3): Doesn't interfere with normal activities, abdomen soft and not tender to touch.     - MODERATE (4-7): Interferes with normal activities or awakens from sleep, abdomen tender to touch.     - SEVERE (8-10): Excruciating pain, doubled over, unable to do any normal activities.       3/10 7. RECURRENT SYMPTOM: "Have you ever had this type of stomach pain before?" If Yes, ask: "When was the last time?" and "What happened that time?"      Yes, one other time 5 years ago 8. CAUSE: "What do you think is causing the stomach pain?"     I think it is from fluid build-up 9. RELIEVING/AGGRAVATING FACTORS: "What makes it better or worse?" (e.g., antacids, bending or twisting motion, bowel movement)     Bending over hurts and heating pain makes it feel better  10. OTHER SYMPTOMS: "Do you have any other symptoms?" (e.g., back pain, diarrhea, fever, urination pain, vomiting)       Back pain,  Protocols used: Abdominal Pain - Male-A-AH

## 2023-02-12 ENCOUNTER — Encounter: Payer: Self-pay | Admitting: Family Medicine

## 2023-02-12 ENCOUNTER — Ambulatory Visit (INDEPENDENT_AMBULATORY_CARE_PROVIDER_SITE_OTHER): Payer: HMO | Admitting: Family Medicine

## 2023-02-12 VITALS — BP 107/71 | HR 64 | Temp 97.6°F | Resp 16 | Ht 65.0 in | Wt 152.4 lb

## 2023-02-12 DIAGNOSIS — K7031 Alcoholic cirrhosis of liver with ascites: Secondary | ICD-10-CM

## 2023-02-12 MED ORDER — SPIRONOLACTONE 25 MG PO TABS: 25.0000 mg | ORAL_TABLET | Freq: Every day | ORAL | 1 refills | Status: DC

## 2023-02-12 NOTE — Progress Notes (Signed)
Established patient visit   Patient: [name]   DOB: 19-Nov-1950   72 y.o. Male  MRN: [id number] Visit Date: 02/12/2023  Today's healthcare provider: Mila Merry, MD   Chief Complaint  Patient presents with   Abdominal Pain    Patient C/O fluid in abdominal area 2-3 weeks. He reports fu with GI in September. He denies any shortness of breath or cough. He reports chronic loose stools. He denies any constipation, nausea or vomiting.    Subjective    HPI HPI     Abdominal Pain    Additional comments: Patient C/O fluid in abdominal area 2-3 weeks. He reports fu with GI in September. He denies any shortness of breath or cough. He reports chronic loose stools. He denies any constipation, nausea or vomiting.       Last edited by Myles Lipps, CMA on 02/12/2023 11:27 AM.      Discussed the use of AI scribe software for clinical note transcription with the patient, who gave verbal consent to proceed.  History of Present Illness   The patient, with a history of cirrhosis, hypertension and a umbilical hernia, presents with a chief complaint of abdominal fluid retention. He reports that the fluid accumulation has been causing discomfort and exacerbating his hernia for approximately three weeks. He is currently on a diuretic regimen, including Furosemide, but feels it is not sufficiently managing his symptoms. He is unable to recall the specifics of his medication regimen, but he expresses a desire for a stronger diuretic to alleviate his fluid retention. He is pretty sure he is taking furosemide 60mg  a day. was previously prescribed 100mg  spironolactone, but was last prescribed 30 days with no refills in August of 2023.   The patient reports a history of ascites aspiration from his abdomen, but notes that the current fluid accumulation is not as severe. He has been taking potassium supplements, but is willing to discontinue them if necessary. He has history of alcohol abuse  but states he has abstained for at least a year.  The patient's hypertension is managed with Lisinopril, but he reports a recent blood pressure reading of 170/71. He is open to adjusting his hypertension management in light of his fluid retention issues.       Medications: Outpatient Medications Prior to Visit  Medication Sig   Accu-Chek Softclix Lancets lancets Use to check glucose ac and as needed   acetaminophen (TYLENOL) 500 MG tablet Take 1,000 mg by mouth every 6 (six) hours as needed.   allopurinol (ZYLOPRIM) 100 MG tablet TAKE 1 TABLET BY MOUTH EVERY DAY   B-D UF III MINI PEN NEEDLES 31G X 5 MM MISC USE 1 TO CHECK BLOOD SUGAR 3 TIMES DAILY   carvedilol (COREG) 3.125 MG tablet Take 1 tablet (3.125 mg total) by mouth 2 (two) times daily.   Continuous Blood Gluc Receiver (FREESTYLE LIBRE 14 DAY READER) DEVI 1 Device by Does not apply route every 14 (fourteen) days. for type insulin dependent type 2 diabetes   Continuous Blood Gluc Sensor (FREESTYLE LIBRE 14 DAY SENSOR) MISC Place 1 Device onto the skin every 14 (fourteen) days. for insulin dependent type 2 diabetes   fluticasone (CUTIVATE) 0.05 % cream Apply topically.   furosemide (LASIX) 20 MG tablet Take 3 tablets (60 mg total) by mouth daily.   insulin degludec (TRESIBA FLEXTOUCH) 100 UNIT/ML FlexTouch Pen INJECT UP TO 20 UNITS UNDER THE SKIN EVERY DAY AT BEDTIME   lisinopril (ZESTRIL) 5  MG tablet Take 1 tablet (5 mg total) by mouth daily.   magnesium gluconate (MAGONATE) 500 MG tablet Take 500 mg by mouth daily.    Multiple Vitamin (MULTIVITAMIN WITH MINERALS) TABS tablet Take 1 tablet by mouth daily.   naltrexone (DEPADE) 50 MG tablet TAKE 1 TABLET BY MOUTH EVERY DAY   niacin 500 MG tablet Take 500 mg by mouth at bedtime.   NOVOLOG FLEXPEN 100 UNIT/ML FlexPen INJECT 20 UNITS UP TO THREE TIMES A DAY BEFORE MEALS.   omeprazole (PRILOSEC) 40 MG capsule TAKE 1 CAPSULE BY MOUTH EVERY DAY   ONETOUCH VERIO test strip USE TO CHECK SUGAR  THREE TIMES DAILY FOR INSULIN DEPENDENT TYPE 2 DIABETES   Potassium Gluconate 550 MG TABS Take 550 mg by mouth daily.   PRILOSEC OTC 20 MG tablet 40 mg daily.   promethazine (PHENERGAN) 25 MG tablet TAKE 1 TABLET BY MOUTH EVERY 8 HOURS AS NEEDED FOR NAUSEA OR VOMITING.   pyridOXINE (VITAMIN B-6) 50 MG tablet Take 50 mg by mouth daily.   sertraline (ZOLOFT) 100 MG tablet TAKE 1 TABLET BY MOUTH EVERY DAY   silver sulfADIAZINE (SILVADENE) 1 % cream Apply 1 application topically daily.   traMADol (ULTRAM) 50 MG tablet TAKE 1 TABLET BY MOUTH EVERY 8 HOURS AS NEEDED.   zinc gluconate 50 MG tablet Take 50 mg by mouth daily.   spironolactone (ALDACTONE) 100 MG tablet TAKE 1 TABLET (100 MG TOTAL) BY MOUTH DAILY. MUST SCHEDULE OFFICE VISIT (Patient not taking: Reported on 02/12/2023)   [DISCONTINUED] cyclobenzaprine (FLEXERIL) 5 MG tablet Take 1 tablet (5 mg total) by mouth 3 (three) times daily as needed for muscle spasms. (Patient not taking: Reported on 01/16/2023)   No facility-administered medications prior to visit.       Objective    BP 107/71 (BP Location: Left Arm, Patient Position: Sitting, Cuff Size: Normal)   Pulse 64   Temp 97.6 F (36.4 C) (Temporal)   Resp 16   Ht 5\' 5"  (1.651 m)   Wt 152 lb 6.4 oz (69.1 kg)   SpO2 99%   BMI 25.36 kg/m   Physical Exam  General appearance: Well developed, well nourished male, cooperative and in no acute distress Head: Normocephalic, without obvious abnormality, atraumatic Respiratory: Respirations even and unlabored, normal respiratory rate Extremities: All extremities are intact.  Abd:  Moderate Ascites. Slightly tender around umbilicus. No erythema.  Psych: Appropriate mood and affect. Neurologic: Mental status: Alert, oriented to person, place, and time, thought content appropriate.    Assessment & Plan     Assessment and Plan    Ascites Increased abdominal distension over the past 3 weeks. Currently on Furosemide 60mg  daily. Not  currently taking Spironolactone. No recent paracentesis. -Start Spironolactone 25mg  daily. -Discontinue Potassium supplementation due to risk of hyperkalemia with Spironolactone. -Check potassium levels in 2 weeks.  Hypertension Currently on Lisinopril 5mg  daily. Blood pressure 170/71 this morning. -Discontinue Lisinopril due to potential additive hypotensive effect with Spironolactone.  Follow-up in 2 weeks to assess response to Spironolactone and check potassium levels.        No follow-ups on file.         Mila Merry, MD  [facility] [phone] (phone) [phone] (fax)  [facility]

## 2023-02-12 NOTE — Patient Instructions (Addendum)
Please review the attached list of medications and notify my office if there are any errors.   Stop taking potassium and lisinopril when you start on spironolactone

## 2023-02-13 NOTE — Telephone Encounter (Signed)
Unable to contact letter mailed.

## 2023-02-20 ENCOUNTER — Ambulatory Visit (INDEPENDENT_AMBULATORY_CARE_PROVIDER_SITE_OTHER): Payer: HMO

## 2023-02-20 VITALS — Ht 65.0 in | Wt 152.0 lb

## 2023-02-20 DIAGNOSIS — Z0000 Encounter for general adult medical examination without abnormal findings: Secondary | ICD-10-CM | POA: Diagnosis not present

## 2023-02-20 NOTE — Progress Notes (Signed)
Subjective:   [name] is a 72 y.o. male who presents for Medicare Annual/Subsequent preventive examination.  Visit Complete: Virtual  I connected with  [name] on 02/20/23 by a audio enabled telemedicine application and verified that I am speaking with the correct person using two identifiers.  Patient Location: Home  Provider Location: Office/Clinic  I discussed the limitations of evaluation and management by telemedicine. The patient expressed understanding and agreed to proceed.  Vital Signs: Unable to obtain new vitals due to this being a telehealth visit.  Patient Medicare AWV questionnaire was completed by the patient on (not done); I have confirmed that all information answered by patient is correct and no changes since this date.  Review of Systems    Cardiac Risk Factors include: advanced age (>19men, >56 women);diabetes mellitus;hypertension;male gender    Objective:    Today's Vitals   02/20/23 1307  Weight: 152 lb (68.9 kg)  Height: 5\' 5"  (1.651 m)   Body mass index is 25.29 kg/m.     02/20/2023    1:24 PM 10/25/2022   10:27 AM 05/18/2020    2:57 PM 11/16/2019   12:53 PM 08/21/2019   11:55 AM 05/29/2018    4:11 PM 10/02/2016    9:18 AM  Advanced Directives  Does Patient Have a Medical Advance Directive? Yes Yes Yes No Yes No Yes  Type of Estate agent of [place];Living will  Healthcare Power of [place];Living will    Healthcare Power of [place];Living will  Copy of Healthcare Power of Attorney in Chart?   No - copy requested      Would patient like information on creating a medical advance directive?    No - Patient declined       Current Medications (verified) Outpatient Encounter Medications as of 02/20/2023  Medication Sig   Accu-Chek Softclix Lancets lancets Use to check glucose ac and as needed   acetaminophen (TYLENOL) 500 MG tablet Take 1,000 mg by mouth every 6 (six) hours as needed.   allopurinol (ZYLOPRIM) 100 MG  tablet TAKE 1 TABLET BY MOUTH EVERY DAY   B-D UF III MINI PEN NEEDLES 31G X 5 MM MISC USE 1 TO CHECK BLOOD SUGAR 3 TIMES DAILY   carvedilol (COREG) 3.125 MG tablet Take 1 tablet (3.125 mg total) by mouth 2 (two) times daily.   Continuous Blood Gluc Receiver (FREESTYLE LIBRE 14 DAY READER) DEVI 1 Device by Does not apply route every 14 (fourteen) days. for type insulin dependent type 2 diabetes   Continuous Blood Gluc Sensor (FREESTYLE LIBRE 14 DAY SENSOR) MISC Place 1 Device onto the skin every 14 (fourteen) days. for insulin dependent type 2 diabetes   fluticasone (CUTIVATE) 0.05 % cream Apply topically.   furosemide (LASIX) 20 MG tablet TAKE 3 TABLETS BY MOUTH EVERY DAY   insulin degludec (TRESIBA FLEXTOUCH) 100 UNIT/ML FlexTouch Pen INJECT UP TO 20 UNITS UNDER THE SKIN EVERY DAY AT BEDTIME   magnesium gluconate (MAGONATE) 500 MG tablet Take 500 mg by mouth daily.    Multiple Vitamin (MULTIVITAMIN WITH MINERALS) TABS tablet Take 1 tablet by mouth daily.   naltrexone (DEPADE) 50 MG tablet TAKE 1 TABLET BY MOUTH EVERY DAY   niacin 500 MG tablet Take 500 mg by mouth at bedtime.   NOVOLOG FLEXPEN 100 UNIT/ML FlexPen INJECT 20 UNITS UP TO THREE TIMES A DAY BEFORE MEALS.   omeprazole (PRILOSEC) 40 MG capsule TAKE 1 CAPSULE BY MOUTH EVERY DAY   ONETOUCH VERIO test  strip USE TO CHECK SUGAR THREE TIMES DAILY FOR INSULIN DEPENDENT TYPE 2 DIABETES   PRILOSEC OTC 20 MG tablet 40 mg daily.   promethazine (PHENERGAN) 25 MG tablet TAKE 1 TABLET BY MOUTH EVERY 8 HOURS AS NEEDED FOR NAUSEA OR VOMITING.   pyridOXINE (VITAMIN B-6) 50 MG tablet Take 50 mg by mouth daily.   sertraline (ZOLOFT) 100 MG tablet TAKE 1 TABLET BY MOUTH EVERY DAY   silver sulfADIAZINE (SILVADENE) 1 % cream Apply 1 application topically daily. (Patient not taking: Reported on 02/20/2023)   spironolactone (ALDACTONE) 25 MG tablet Take 1 tablet (25 mg total) by mouth daily. MUST SCHEDULE OFFICE VISIT   traMADol (ULTRAM) 50 MG tablet TAKE 1  TABLET BY MOUTH EVERY 8 HOURS AS NEEDED.   zinc gluconate 50 MG tablet Take 50 mg by mouth daily.   No facility-administered encounter medications on file as of 02/20/2023.    Allergies (verified) Hydrochlorothiazide   History: Past Medical History:  Diagnosis Date   Alcoholism (HCC)    Anxiety    Arthritis    Cancer (HCC)    Cirrhosis with alcoholism (HCC)    Depression    Diabetes mellitus without complication (HCC)    Dyspnea    Elevated liver enzymes    Esophageal varices (HCC)    Gastritis    GERD (gastroesophageal reflux disease)    Hyperlipidemia    Hypertension    Pancreatitis    Past Surgical History:  Procedure Laterality Date   CHOLECYSTECTOMY  2006   COLONOSCOPY WITH ESOPHAGOGASTRODUODENOSCOPY (EGD)     COLONOSCOPY WITH PROPOFOL N/A 11/21/2015   Procedure: COLONOSCOPY WITH PROPOFOL;  Surgeon: Scot Jun, MD;  Location: [facility] ENDOSCOPY;  Service: Endoscopy;  Laterality: N/A;   ESOPHAGOGASTRODUODENOSCOPY (EGD) WITH PROPOFOL N/A 08/21/2019   Procedure: ESOPHAGOGASTRODUODENOSCOPY (EGD) WITH PROPOFOL;  Surgeon: Midge Minium, MD;  Location: [facility] ENDOSCOPY;  Service: Endoscopy;  Laterality: N/A;   HERNIA REPAIR     Inguinal Hernia Repai. Left: 07/28/2013 Dr. Egbert Garibaldi; 2nd repair done 04/30/2014   TONSILLECTOMY     Family History  Problem Relation Age of Onset   Colon polyps Father    Lung cancer Father    Hypertension Father    Dementia Mother    Hypertension Brother    Heart Problems Brother    Hypertension Brother    Diabetes Brother        type 2   Congestive Heart Failure Brother    Social History   Socioeconomic History   Marital status: Married    Spouse name: Candy   Number of children: 3   Years of education: Not on file   Highest education level: Some college, no degree  Occupational History   Occupation: retired  Tobacco Use   Smoking status: Never   Smokeless tobacco: Never  Vaping Use   Vaping status: Never Used  Substance and Sexual  Activity   Alcohol use: Not Currently    Comment: previous heavy drinker   Drug use: No   Sexual activity: Not Currently  Other Topics Concern   Not on file  Social History Narrative   Not on file   Social Determinants of Health   Financial Resource Strain: Low Risk  (02/20/2023)   Overall Financial Resource Strain (CARDIA)    Difficulty of Paying Living Expenses: Not hard at all  Food Insecurity: No Food Insecurity (02/20/2023)   Hunger Vital Sign    Worried About Running Out of Food in the Last Year: Never true  Ran Out of Food in the Last Year: Never true  Transportation Needs: No Transportation Needs (02/20/2023)   PRAPARE - Administrator, Civil Service (Medical): No    Lack of Transportation (Non-Medical): No  Physical Activity: Sufficiently Active (02/20/2023)   Exercise Vital Sign    Days of Exercise per Week: 5 days    Minutes of Exercise per Session: 40 min  Stress: No Stress Concern Present (02/20/2023)   [employer] of Occupational Health - Occupational Stress Questionnaire    Feeling of Stress : Not at all  Social Connections: Moderately Isolated (02/20/2023)   Social Connection and Isolation Panel [NHANES]    Frequency of Communication with Friends and Family: More than three times a week    Frequency of Social Gatherings with Friends and Family: More than three times a week    Attends Religious Services: Never    Database administrator or Organizations: No    Attends Engineer, structural: Never    Marital Status: Married    Tobacco Counseling Counseling given: Not Answered   Clinical Intake:  Pre-visit preparation completed: Yes  Pain : No/denies pain   BMI - recorded: 25.29 Nutritional Status: BMI 25 -29 Overweight Nutritional Risks: Nausea/ vomitting/ diarrhea (nauseous some) Diabetes: Yes CBG done?: Yes (BS 74 this am at home) CBG resulted in Enter/ Edit results?: No Did pt. bring in CBG monitor from home?: No  How  often do you need to have someone help you when you read instructions, pamphlets, or other written materials from your doctor or pharmacy?: 1 - Never  Interpreter Needed?: No  Comments: lives with wife Information entered by :: B.,LPN   Activities of Daily Living    02/20/2023    1:24 PM 10/25/2022   10:29 AM  In your present state of health, do you have any difficulty performing the following activities:  Hearing? 0   Vision? 0   Difficulty concentrating or making decisions? 0   Walking or climbing stairs? 1   Dressing or bathing? 0   Doing errands, shopping? 1 0  Preparing Food and eating ? N   Using the Toilet? N   In the past six months, have you accidently leaked urine? N   Do you have problems with loss of bowel control? N   Managing your Medications? N   Managing your Finances? N   Housekeeping or managing your Housekeeping? N     Patient Care Team: Malva Limes, MD as PCP - General (Family Medicine) Midge Minium, MD as Consulting Physician (Gastroenterology) Cherre Huger, DC as Referring Physician (Chiropractic Medicine) Pcp, No Pa, [facility] (Optometry)  Indicate any recent Medical Services you may have received from other than [facility] providers in the past year (date may be approximate).     Assessment:   This is a routine wellness examination for Deanthony.  Hearing/Vision screen Hearing Screening - Comments:: Adequate hearing Vision Screening - Comments:: a  Dietary issues and exercise activities discussed:     Goals Addressed             This Visit's Progress    COMPLETED: DIET - INCREASE WATER INTAKE   On track    Recommend to drink at least 6-8 8oz glasses of water per day.       Depression Screen    02/20/2023    1:22 PM 02/12/2023   11:25 AM 09/14/2020   11:42 AM 04/29/2020   11:13 AM 09/15/2018  8:52 AM 10/03/2017    9:32 AM 10/02/2016    9:26 AM  PHQ 2/9 Scores  PHQ - 2 Score 0 0 0 0 1 0 0  PHQ- 9 Score   1 1   3      Fall Risk    02/20/2023    1:15 PM 02/12/2023   11:25 AM 03/01/2022    9:08 PM 09/14/2020   11:42 AM 05/18/2020    2:57 PM  Fall Risk   Falls in the past year? 1 1 1  0 0  Number falls in past yr: 0 0 1 0 0  Injury with Fall? 1 1 1  0 0  Risk for fall due to : Impaired balance/gait No Fall Risks;History of fall(s)     Follow up Falls prevention discussed;Education provided Falls evaluation completed       MEDICARE RISK AT HOME:  Medicare Risk at Home - 02/20/23 1316     Any stairs in or around the home? Yes    If so, are there any without handrails? Yes    Home free of loose throw rugs in walkways, pet beds, electrical cords, etc? Yes    Adequate lighting in your home to reduce risk of falls? Yes    Life alert? No    Use of a cane, walker or w/c? No    Grab bars in the bathroom? Yes    Shower chair or bench in shower? Yes    Elevated toilet seat or a handicapped toilet? Yes             TIMED UP AND GO:  Was the test performed?  No    Cognitive Function:        02/20/2023    1:25 PM 05/18/2020    3:06 PM  6CIT Screen  What Year? 0 points 0 points  What month? 0 points 0 points  What time? 0 points 0 points  Count back from 20 0 points 0 points  Months in reverse 0 points 0 points  Repeat phrase 0 points 0 points  Total Score 0 points 0 points    Immunizations Immunization History  Administered Date(s) Administered   COVID-19, mRNA, vaccine(Comirnaty)12 years and older 03/28/2022   Fluad Quad(high Dose 65+) 03/12/2022   Hep A / Hep B 03/28/2022, 12/14/2022   Influenza Split 05/12/2007, 05/24/2009, 06/05/2011, 05/12/2012   Influenza, High Dose Seasonal PF 04/03/2017, 04/14/2018, 04/03/2021   Influenza,inj,Quad PF,6+ Mos 05/26/2013, 05/20/2014, 04/11/2015   Influenza-Unspecified 03/09/2017, 05/01/2019, 04/22/2020   Moderna Sars-Covid-2 Vaccination 10/26/2019, 11/23/2019, 05/25/2020, 10/06/2020   Pneumococcal Conjugate-13 10/02/2016, 04/14/2018   Pneumococcal  Polysaccharide-23 08/06/2011, 10/03/2017   Td 07/09/2006   Tdap 06/05/2011, 09/15/2020   Zoster Recombinant(Shingrix) 09/15/2020, 08/23/2021   Zoster, Live 01/04/2011    TDAP status: Up to date  Flu Vaccine status: Up to date  Pneumococcal vaccine status: Up to date  Covid-19 vaccine status: Completed vaccines  Qualifies for Shingles Vaccine? Yes   Zostavax completed Yes   Shingrix Completed?: Yes  Screening Tests Health Maintenance  Topic Date Due   COVID-19 Vaccine (6 - 2023-24 season) 05/23/2022   Colonoscopy  11/21/2022   INFLUENZA VACCINE  10/07/2023 (Originally 02/07/2023)   Diabetic kidney evaluation - Urine ACR  03/29/2023   HEMOGLOBIN A1C  06/15/2023   OPHTHALMOLOGY EXAM  11/07/2023   Diabetic kidney evaluation - eGFR measurement  12/14/2023   Medicare Annual Wellness (AWV)  02/20/2024   DTaP/Tdap/Td (4 - Td or Tdap) 09/16/2030   Pneumonia Vaccine 65+ Years  old  Completed   Hepatitis C Screening  Completed   Zoster Vaccines- Shingrix  Completed   HPV VACCINES  Aged Out    Health Maintenance  Health Maintenance Due  Topic Date Due   COVID-19 Vaccine (6 - 2023-24 season) 05/23/2022   Colonoscopy  11/21/2022    Colorectal cancer screening: Type of screening: Colonoscopy. Completed no. Repeat every 5-10 years Has GE appt in Sept 2024 with Dr Servando Snare  Lung Cancer Screening: (Low Dose CT Chest recommended if Age 78-80 years, 20 pack-year currently smoking OR have quit w/in 15years.) does not qualify.   Lung Cancer Screening Referral: no  Additional Screening:  Hepatitis C Screening: does not qualify; Completed yes  Vision Screening: Recommended annual ophthalmology exams for early detection of glaucoma and other disorders of the eye. Is the patient up to date with their annual eye exam?  Yes  Who is the provider or what is the name of the office in which the patient attends annual eye exams? [facility] If pt is not established with a provider, would they like  to be referred to a provider to establish care? No .   Dental Screening: Recommended annual dental exams for proper oral hygiene  Diabetic Foot Exam: Diabetic Foot Exam: Overdue, Pt has been advised about the importance in completing this exam. Pt is scheduled for diabetic foot exam on 02/25/23 w/PCP.  Community Resource Referral / Chronic Care Management: CRR required this visit?  No   CCM required this visit?  No    Plan:     I have personally reviewed and noted the following in the patient's chart:   Medical and social history Use of alcohol, tobacco or illicit drugs  Current medications and supplements including opioid prescriptions. Patient is not currently taking opioid prescriptions. Functional ability and status Nutritional status Physical activity Advanced directives List of other physicians Hospitalizations, surgeries, and ER visits in previous 12 months Vitals Screenings to include cognitive, depression, and falls Referrals and appointments  In addition, I have reviewed and discussed with patient certain preventive protocols, quality metrics, and best practice recommendations. A written personalized care plan for preventive services as well as general preventive health recommendations were provided to patient.     Sue Lush, LPN   1/61/0960   After Visit Summary: (MyChart) Due to this being a telephonic visit, the after visit summary with patients personalized plan was offered to patient via MyChart   Nurse Notes: The patient states he is doing alright. He is concerned with loose stools he is having daily. He relays he has a GE appt in Sept 2024 with Dr. Servando Snare.  Otherwise, he has no concerns or questions at this time.He anticpates his upcoming visit next with PCP.

## 2023-02-20 NOTE — Patient Instructions (Signed)
[name] , Thank you for taking time to come for your Medicare Wellness Visit. I appreciate your ongoing commitment to your health goals. Please review the following plan we discussed and let me know if I can assist you in the future.   Referrals/Orders/Follow-Ups/Clinician Recommendations: none  This is a list of the screening recommended for you and due dates:  Health Maintenance  Topic Date Due   COVID-19 Vaccine (6 - 2023-24 season) 05/23/2022   Colon Cancer Screening  11/21/2022   Flu Shot  10/07/2023*   Yearly kidney health urinalysis for diabetes  03/29/2023   Hemoglobin A1C  06/15/2023   Eye exam for diabetics  11/07/2023   Yearly kidney function blood test for diabetes  12/14/2023   Medicare Annual Wellness Visit  02/20/2024   DTaP/Tdap/Td vaccine (4 - Td or Tdap) 09/16/2030   Pneumonia Vaccine  Completed   Hepatitis C Screening  Completed   Zoster (Shingles) Vaccine  Completed   HPV Vaccine  Aged Out  *Topic was postponed. The date shown is not the original due date.    Advanced directives: (Copy Requested) Please bring a copy of your health care power of attorney and living will to the office to be added to your chart at your convenience.  Next Medicare Annual Wellness Visit scheduled for next year: Yes 02/25/2024 @ 1pm telephone  Preventive Care 65 Years and Older, Male  Preventive care refers to lifestyle choices and visits with your health care provider that can promote health and wellness. What does preventive care include? A yearly physical exam. This is also called an annual well check. Dental exams once or twice a year. Routine eye exams. Ask your health care provider how often you should have your eyes checked. Personal lifestyle choices, including: Daily care of your teeth and gums. Regular physical activity. Eating a healthy diet. Avoiding tobacco and drug use. Limiting alcohol use. Practicing safe sex. Taking low doses of aspirin every day. Taking  vitamin and mineral supplements as recommended by your health care provider. What happens during an annual well check? The services and screenings done by your health care provider during your annual well check will depend on your age, overall health, lifestyle risk factors, and family history of disease. Counseling  Your health care provider may ask you questions about your: Alcohol use. Tobacco use. Drug use. Emotional well-being. Home and relationship well-being. Sexual activity. Eating habits. History of falls. Memory and ability to understand (cognition). Work and work Astronomer. Screening  You may have the following tests or measurements: Height, weight, and BMI. Blood pressure. Lipid and cholesterol levels. These may be checked every 5 years, or more frequently if you are over 55 years old. Skin check. Lung cancer screening. You may have this screening every year starting at age 65 if you have a 30-pack-year history of smoking and currently smoke or have quit within the past 15 years. Fecal occult blood test (FOBT) of the stool. You may have this test every year starting at age 63. Flexible sigmoidoscopy or colonoscopy. You may have a sigmoidoscopy every 5 years or a colonoscopy every 10 years starting at age 49. Prostate cancer screening. Recommendations will vary depending on your family history and other risks. Hepatitis C blood test. Hepatitis B blood test. Sexually transmitted disease (STD) testing. Diabetes screening. This is done by checking your blood sugar (glucose) after you have not eaten for a while (fasting). You may have this done every 1-3 years. Abdominal aortic aneurysm (AAA) screening.  You may need this if you are a current or former smoker. Osteoporosis. You may be screened starting at age 88 if you are at high risk. Talk with your health care provider about your test results, treatment options, and if necessary, the need for more tests. Vaccines  Your  health care provider may recommend certain vaccines, such as: Influenza vaccine. This is recommended every year. Tetanus, diphtheria, and acellular pertussis (Tdap, Td) vaccine. You may need a Td booster every 10 years. Zoster vaccine. You may need this after age 82. Pneumococcal 13-valent conjugate (PCV13) vaccine. One dose is recommended after age 44. Pneumococcal polysaccharide (PPSV23) vaccine. One dose is recommended after age 66. Talk to your health care provider about which screenings and vaccines you need and how often you need them. This information is not intended to replace advice given to you by your health care provider. Make sure you discuss any questions you have with your health care provider. Document Released: 07/22/2015 Document Revised: 03/14/2016 Document Reviewed: 04/26/2015 Elsevier Interactive Patient Education  2017 [employer].  Fall Prevention in the Home Falls can cause injuries. They can happen to people of all ages. There are many things you can do to make your home safe and to help prevent falls. What can I do on the outside of my home? Regularly fix the edges of walkways and driveways and fix any cracks. Remove anything that might make you trip as you walk through a door, such as a raised step or threshold. Trim any bushes or trees on the path to your home. Use bright outdoor lighting. Clear any walking paths of anything that might make someone trip, such as rocks or tools. Regularly check to see if handrails are loose or broken. Make sure that both sides of any steps have handrails. Any raised decks and porches should have guardrails on the edges. Have any leaves, snow, or ice cleared regularly. Use sand or salt on walking paths during winter. Clean up any spills in your garage right away. This includes oil or grease spills. What can I do in the bathroom? Use night lights. Install grab bars by the toilet and in the tub and shower. Do not use towel bars as  grab bars. Use non-skid mats or decals in the tub or shower. If you need to sit down in the shower, use a plastic, non-slip stool. Keep the floor dry. Clean up any water that spills on the floor as soon as it happens. Remove soap buildup in the tub or shower regularly. Attach bath mats securely with double-sided non-slip rug tape. Do not have throw rugs and other things on the floor that can make you trip. What can I do in the bedroom? Use night lights. Make sure that you have a light by your bed that is easy to reach. Do not use any sheets or blankets that are too big for your bed. They should not hang down onto the floor. Have a firm chair that has side arms. You can use this for support while you get dressed. Do not have throw rugs and other things on the floor that can make you trip. What can I do in the kitchen? Clean up any spills right away. Avoid walking on wet floors. Keep items that you use a lot in easy-to-reach places. If you need to reach something above you, use a strong step stool that has a grab bar. Keep electrical cords out of the way. Do not use floor polish or wax  that makes floors slippery. If you must use wax, use non-skid floor wax. Do not have throw rugs and other things on the floor that can make you trip. What can I do with my stairs? Do not leave any items on the stairs. Make sure that there are handrails on both sides of the stairs and use them. Fix handrails that are broken or loose. Make sure that handrails are as long as the stairways. Check any carpeting to make sure that it is firmly attached to the stairs. Fix any carpet that is loose or worn. Avoid having throw rugs at the top or bottom of the stairs. If you do have throw rugs, attach them to the floor with carpet tape. Make sure that you have a light switch at the top of the stairs and the bottom of the stairs. If you do not have them, ask someone to add them for you. What else can I do to help prevent  falls? Wear shoes that: Do not have high heels. Have rubber bottoms. Are comfortable and fit you well. Are closed at the toe. Do not wear sandals. If you use a stepladder: Make sure that it is fully opened. Do not climb a closed stepladder. Make sure that both sides of the stepladder are locked into place. Ask someone to hold it for you, if possible. Clearly mark and make sure that you can see: Any grab bars or handrails. First and last steps. Where the edge of each step is. Use tools that help you move around (mobility aids) if they are needed. These include: Canes. Walkers. Scooters. Crutches. Turn on the lights when you go into a dark area. Replace any light bulbs as soon as they burn out. Set up your furniture so you have a clear path. Avoid moving your furniture around. If any of your floors are uneven, fix them. If there are any pets around you, be aware of where they are. Review your medicines with your doctor. Some medicines can make you feel dizzy. This can increase your chance of falling. Ask your doctor what other things that you can do to help prevent falls. This information is not intended to replace advice given to you by your health care provider. Make sure you discuss any questions you have with your health care provider. Document Released: 04/21/2009 Document Revised: 12/01/2015 Document Reviewed: 07/30/2014 Elsevier Interactive Patient Education  2017 [employer].

## 2023-02-22 ENCOUNTER — Other Ambulatory Visit: Payer: Self-pay | Admitting: Family Medicine

## 2023-02-22 DIAGNOSIS — Z794 Long term (current) use of insulin: Secondary | ICD-10-CM

## 2023-02-22 NOTE — Telephone Encounter (Signed)
Requested Prescriptions  Pending Prescriptions Disp Refills   insulin degludec (TRESIBA FLEXTOUCH) 100 UNIT/ML FlexTouch Pen [Pharmacy Med Name: TRESIBA FLEXTOUCH 100 UNIT/ML] 9 mL 1    Sig: INJECT UP TO 20 UNITS UNDER THE SKIN EVERY DAY AT BEDTIME. NEED APPT FOR REFILLS.     Endocrinology:  Diabetes - Insulins Passed - 02/22/2023  1:28 AM      Passed - HBA1C is between 0 and 7.9 and within 180 days    Hemoglobin A1C  Date Value Ref Range Status  09/07/2012 11.3 (H) 4.2 - 6.3 % Final    Comment:    The American Diabetes Association recommends that a primary goal of therapy should be <7% and that physicians should reevaluate the treatment regimen in patients with HbA1c values consistently >8%.    Hgb A1c MFr Bld  Date Value Ref Range Status  12/14/2022 6.4 (H) 4.8 - 5.6 % Final    Comment:             Prediabetes: 5.7 - 6.4          Diabetes: >6.4          Glycemic control for adults with diabetes: <7.0          Passed - Valid encounter within last 6 months    Recent Outpatient Visits           1 week ago Ascites due to alcoholic cirrhosis (HCC)   [place] [facility] Malva Limes, MD   2 months ago Type 2 diabetes mellitus with hyperglycemia, with long-term current use of insulin (HCC)   [place] [facility] Malva Limes, MD   11 months ago Type 2 diabetes mellitus with hyperglycemia, with long-term current use of insulin (HCC)   [place] [facility] Malva Limes, MD   1 year ago Type 2 diabetes mellitus with hyperglycemia, with long-term current use of insulin [facility])   [place] [facility] Alfredia Ferguson, PA-C   1 year ago Type 2 diabetes mellitus with hyperglycemia, with long-term current use of insulin (HCC)   [place] [facility] Sherrie Mustache, Demetrios Isaacs, MD       Future Appointments             In 3 days Fisher, Demetrios Isaacs, MD [facility]   In  1 month Midge Minium, MD [facility] [place]   In 1 month Fisher, Demetrios Isaacs, MD [facility]   In 4 months Deirdre Evener, MD [facility]

## 2023-02-25 ENCOUNTER — Encounter: Payer: Self-pay | Admitting: Family Medicine

## 2023-02-25 ENCOUNTER — Ambulatory Visit (INDEPENDENT_AMBULATORY_CARE_PROVIDER_SITE_OTHER): Payer: HMO | Admitting: Family Medicine

## 2023-02-25 VITALS — BP 100/63 | HR 62 | Ht 65.0 in | Wt 152.2 lb

## 2023-02-25 DIAGNOSIS — K7031 Alcoholic cirrhosis of liver with ascites: Secondary | ICD-10-CM

## 2023-02-25 DIAGNOSIS — K7030 Alcoholic cirrhosis of liver without ascites: Secondary | ICD-10-CM | POA: Diagnosis not present

## 2023-02-25 NOTE — Progress Notes (Signed)
Established patient visit   Patient: [name]   DOB: 11-05-1950   72 y.o. Male  MRN: [id number] Visit Date: 02/25/2023  Today's healthcare provider: Mila Merry, MD   Chief Complaint  Patient presents with   Medical Management of Chronic Issues   Subjective    HPI  Here to follow up ascites since starting back on spironolactone and stopping potassium supplement last week. He hasn't noticed any change in abdominal swelling, but tolerating medication without adverse effect.  Lab Results  Component Value Date   NA 135 12/14/2022   CL 95 (L) 12/14/2022   K 5.1 12/14/2022   CO2 27 12/14/2022   BUN 7 (L) 12/14/2022   CREATININE 0.62 (L) 12/14/2022   EGFR 102 12/14/2022   CALCIUM 8.6 12/14/2022   PHOS 4.0 12/14/2022   ALBUMIN 3.4 (L) 12/14/2022   GLUCOSE 283 (H) 12/14/2022     Medications: Outpatient Medications Prior to Visit  Medication Sig   Accu-Chek Softclix Lancets lancets Use to check glucose ac and as needed   acetaminophen (TYLENOL) 500 MG tablet Take 1,000 mg by mouth every 6 (six) hours as needed.   allopurinol (ZYLOPRIM) 100 MG tablet TAKE 1 TABLET BY MOUTH EVERY DAY   B-D UF III MINI PEN NEEDLES 31G X 5 MM MISC USE 1 TO CHECK BLOOD SUGAR 3 TIMES DAILY   carvedilol (COREG) 3.125 MG tablet Take 1 tablet (3.125 mg total) by mouth 2 (two) times daily.   Continuous Blood Gluc Receiver (FREESTYLE LIBRE 14 DAY READER) DEVI 1 Device by Does not apply route every 14 (fourteen) days. for type insulin dependent type 2 diabetes   Continuous Blood Gluc Sensor (FREESTYLE LIBRE 14 DAY SENSOR) MISC Place 1 Device onto the skin every 14 (fourteen) days. for insulin dependent type 2 diabetes   fluticasone (CUTIVATE) 0.05 % cream Apply topically.   furosemide (LASIX) 20 MG tablet TAKE 3 TABLETS BY MOUTH EVERY DAY   insulin degludec (TRESIBA FLEXTOUCH) 100 UNIT/ML FlexTouch Pen INJECT UP TO 20 UNITS UNDER THE SKIN EVERY DAY AT BEDTIME. NEED APPT FOR REFILLS.   magnesium  gluconate (MAGONATE) 500 MG tablet Take 500 mg by mouth daily.    Multiple Vitamin (MULTIVITAMIN WITH MINERALS) TABS tablet Take 1 tablet by mouth daily.   naltrexone (DEPADE) 50 MG tablet TAKE 1 TABLET BY MOUTH EVERY DAY   niacin 500 MG tablet Take 500 mg by mouth at bedtime.   NOVOLOG FLEXPEN 100 UNIT/ML FlexPen INJECT 20 UNITS UP TO THREE TIMES A DAY BEFORE MEALS.   omeprazole (PRILOSEC) 40 MG capsule TAKE 1 CAPSULE BY MOUTH EVERY DAY   ONETOUCH VERIO test strip USE TO CHECK SUGAR THREE TIMES DAILY FOR INSULIN DEPENDENT TYPE 2 DIABETES   PRILOSEC OTC 20 MG tablet 40 mg daily.   promethazine (PHENERGAN) 25 MG tablet TAKE 1 TABLET BY MOUTH EVERY 8 HOURS AS NEEDED FOR NAUSEA OR VOMITING.   pyridOXINE (VITAMIN B-6) 50 MG tablet Take 50 mg by mouth daily.   sertraline (ZOLOFT) 100 MG tablet TAKE 1 TABLET BY MOUTH EVERY DAY   silver sulfADIAZINE (SILVADENE) 1 % cream Apply 1 application topically daily.   spironolactone (ALDACTONE) 25 MG tablet Take 1 tablet (25 mg total) by mouth daily. MUST SCHEDULE OFFICE VISIT   traMADol (ULTRAM) 50 MG tablet TAKE 1 TABLET BY MOUTH EVERY 8 HOURS AS NEEDED.   zinc gluconate 50 MG tablet Take 50 mg by mouth daily.   No facility-administered medications prior to  visit.     Objective    BP 100/63 (BP Location: Left Arm, Patient Position: Sitting, Cuff Size: Normal)   Pulse 62   Ht 5\' 5"  (1.651 m)   Wt 152 lb 3.2 oz (69 kg)   SpO2 100%   BMI 25.33 kg/m    Physical Exam  Abd:  Moderate Ascites. Slightly tender around umbilicus. No erythema.    Assessment & Plan     1. Alcoholic cirrhosis of liver with ascites (HCC) Tolerating restarting of spironolactone. Discussed option of u/s guided paracentesis versus medical management. He prefers medication at this point. If electrolytes stable will double dose of spironolaction.  - Comprehensive metabolic panel         Mila Merry, MD  [facility] [phone]  (phone) [phone] (fax)  [facility]

## 2023-02-26 LAB — COMPREHENSIVE METABOLIC PANEL
ALT: 13 IU/L (ref 0–44)
AST: 26 IU/L (ref 0–40)
Albumin: 3.3 g/dL — ABNORMAL LOW (ref 3.8–4.8)
Alkaline Phosphatase: 214 IU/L — ABNORMAL HIGH (ref 44–121)
BUN/Creatinine Ratio: 13 (ref 10–24)
BUN: 8 mg/dL (ref 8–27)
Bilirubin Total: 1.5 mg/dL — ABNORMAL HIGH (ref 0.0–1.2)
CO2: 26 mmol/L (ref 20–29)
Calcium: 8.5 mg/dL — ABNORMAL LOW (ref 8.6–10.2)
Chloride: 96 mmol/L (ref 96–106)
Creatinine, Ser: 0.6 mg/dL — ABNORMAL LOW (ref 0.76–1.27)
Globulin, Total: 2.9 g/dL (ref 1.5–4.5)
Glucose: 146 mg/dL — ABNORMAL HIGH (ref 70–99)
Potassium: 4.3 mmol/L (ref 3.5–5.2)
Sodium: 133 mmol/L — ABNORMAL LOW (ref 134–144)
Total Protein: 6.2 g/dL (ref 6.0–8.5)
eGFR: 103 mL/min/{1.73_m2} (ref >59)

## 2023-03-05 ENCOUNTER — Ambulatory Visit: Payer: Self-pay | Admitting: *Deleted

## 2023-03-05 MED ORDER — SPIRONOLACTONE 50 MG PO TABS: 50.0000 mg | ORAL_TABLET | Freq: Every day | ORAL | 1 refills | Status: DC

## 2023-03-05 NOTE — Telephone Encounter (Signed)
Patient advised as below.  

## 2023-03-05 NOTE — Telephone Encounter (Signed)
Summary: fluid on stomach   Per Agent:  "Pt states that he seen his PCP yesterday and they were talking about his medications. Pt thought he was still taking: spironolactone (ALDACTONE) 25 MG tablet. Pt states when he got home he realized that he does not have the medication. Pt called [facility] and they advised him that the medication was already filled and it would be a while before they can refill it. Pt states that he has fluid on his stomach and look like he is 13 months pregnant. Pt is wanting to know if his PCP could call him in stronger fluid pill to take until his able to get the Spironolactone. Pt is wanting to get the fluid off of his stomach. Pt is wanting to speak with a nurse."     [id number] [place], [place] - [address] AT [facility] Phone: [phone] Fax: [phone]         Chief Complaint: Medication Symptoms: Fluid in stomach Frequency: NA Pertinent Negatives: Patient denies NA Disposition: [] ED /[] Urgent Care (no appt availability in office) / [] Appointment(In office/virtual)/ []  [place] [facility]/ [] Home Care/ [] Refused Recommended Disposition /[] [place] [facility]/ [x]  Follow-up with PCP Additional Notes: Pt reports at OV 02/25/23 he reported to Dr. Sherrie Mustache he was taking Spirolactone. States he now realizes he does not have any of that med. States called pharmacy and was told he picked up on 02/12/23 Pt states "I don't  know what ever happened to them, they are not in the box I keep all my meds in."  States pharmacy will not refill. Unsure how long he has been without. States stomach is "Huge." Please advise.  Reason for Disposition  [1] Caller has URGENT medicine question about med that PCP or specialist prescribed AND [2] triager unable to answer question  Answer Assessment - Initial Assessment Questions 1. NAME of MEDICINE: "What medicine(s) are you calling about?"     Spironolactone 2. QUESTION: "What is your question?" (e.g.,  double dose of medicine, side effect)     Needs refill 3. PRESCRIBER: "Who prescribed the medicine?" Reason: if prescribed by specialist, call should be referred to that group.     PCP 4. SYMPTOMS: "Do you have any symptoms?" If Yes, ask: "What symptoms are you having?"  "How bad are the symptoms (e.g., mild, moderate, severe)     Fluid  Protocols used: Medication Question Call-A-AH

## 2023-03-05 NOTE — Telephone Encounter (Signed)
Have sent [facility]mg  [facility]. Just take one tablet daily.

## 2023-03-12 ENCOUNTER — Ambulatory Visit: Payer: Self-pay | Admitting: *Deleted

## 2023-03-12 NOTE — Telephone Encounter (Signed)
Reason for Disposition  Age > 60 years  Answer Assessment - Initial Assessment Questions 1. LOCATION: "Where does it hurt?"      My stomach is swollen.   It's been swollen for 2-3 months now.    I have a hernia in my belly button.   I have fluid in my stomach and it's putting pressure on my hernia.   Dr. Sherrie Mustache prescribed a fluid pill for over a week but it's not working.   I'm not urinating any more than my normal and my weight has not changed. I'm not sure what is causing the fluid in my abd.    I've had this before. 2. RADIATION: "Does the pain shoot anywhere else?" (e.g., chest, back)     I look like I'm pregnant.   3. ONSET: "When did the pain begin?" (Minutes, hours or days ago)      It has not changed. 4. SUDDEN: "Gradual or sudden onset?"     N/A 5. PATTERN "Does the pain come and go, or is it constant?"    - If it comes and goes: "How long does it last?" "Do you have pain now?"     (Note: Comes and goes means the pain is intermittent. It goes away completely between bouts.)    - If constant: "Is it getting better, staying the same, or getting worse?"      (Note: Constant means the pain never goes away completely; most serious pain is constant and gets worse.)      The fluid is constant  6. SEVERITY: "How bad is the pain?"  (e.g., Scale 1-10; mild, moderate, or severe)    - MILD (1-3): Doesn't interfere with normal activities, abdomen soft and not tender to touch.     - MODERATE (4-7): Interferes with normal activities or awakens from sleep, abdomen tender to touch.     - SEVERE (8-10): Excruciating pain, doubled over, unable to do any normal activities.       Moderate fluid in my abd 7. RECURRENT SYMPTOM: "Have you ever had this type of stomach pain before?" If Yes, ask: "When was the last time?" and "What happened that time?"      Yes 8. CAUSE: "What do you think is causing the stomach pain?"     I don't know 9. RELIEVING/AGGRAVATING FACTORS: "What makes it better or worse?"  (e.g., antacids, bending or twisting motion, bowel movement)     I get this fluid build up in my stomach.    I've had fluid drawn off my stomach before.    10. OTHER SYMPTOMS: "Do you have any other symptoms?" (e.g., back pain, diarrhea, fever, urination pain, vomiting)       No  Protocols used: Abdominal Pain - Male-A-AH

## 2023-03-12 NOTE — Telephone Encounter (Signed)
  Chief Complaint: Abd is swollen.  Full of fluid.   Fluid pill Dr. Sherrie Mustache gave him is not helping. Symptoms: abd swelling and has a hernia at belly button Frequency: For the last month Pertinent Negatives: Patient denies that the fluid pill is helping.   Not urinating any more than usual and weight is unchanged. Disposition: [] ED /[] Urgent Care (no appt availability in office) / [x] Appointment(In office/virtual)/ []  [place] [facility]/ [] Home Care/ [] Refused Recommended Disposition /[] [place] [facility]/ []  Follow-up with PCP Additional Notes: Appt made with Merita Norton, NP for 03/13/2023 at 2:40.

## 2023-03-13 ENCOUNTER — Encounter: Payer: Self-pay | Admitting: Family Medicine

## 2023-03-13 ENCOUNTER — Ambulatory Visit: Payer: HMO | Admitting: Family Medicine

## 2023-03-13 VITALS — BP 104/63 | HR 62 | Temp 98.1°F | Resp 16 | Ht 65.0 in | Wt 146.9 lb

## 2023-03-13 DIAGNOSIS — Z794 Long term (current) use of insulin: Secondary | ICD-10-CM | POA: Diagnosis not present

## 2023-03-13 DIAGNOSIS — K439 Ventral hernia without obstruction or gangrene: Secondary | ICD-10-CM | POA: Diagnosis not present

## 2023-03-13 DIAGNOSIS — I8510 Secondary esophageal varices without bleeding: Secondary | ICD-10-CM | POA: Diagnosis not present

## 2023-03-13 DIAGNOSIS — E1165 Type 2 diabetes mellitus with hyperglycemia: Secondary | ICD-10-CM | POA: Diagnosis not present

## 2023-03-13 DIAGNOSIS — K7031 Alcoholic cirrhosis of liver with ascites: Secondary | ICD-10-CM

## 2023-03-13 MED ORDER — SPIRONOLACTONE 100 MG PO TABS: 100.0000 mg | ORAL_TABLET | Freq: Every day | ORAL | 0 refills | Status: DC

## 2023-03-13 NOTE — Progress Notes (Signed)
Established patient visit   Patient: [name]   DOB: [birth year]   72 y.o. Male  MRN: [id number] Visit Date: 03/13/2023  Today's healthcare provider: Jacky Kindle, FNP  Introduced to nurse practitioner role and practice setting.  All questions answered.  Discussed provider/patient relationship and expectations.  Subjective    HPI HPI   Patient C/O fluid in abdominal area. Patient report abdominal pain due to hernia.  Last edited by Myles Lipps, CMA on 03/13/2023  3:01 PM.      Medications: Outpatient Medications Prior to Visit  Medication Sig   Accu-Chek Softclix Lancets lancets Use to check glucose ac and as needed   acetaminophen (TYLENOL) 500 MG tablet Take 1,000 mg by mouth every 6 (six) hours as needed.   allopurinol (ZYLOPRIM) 100 MG tablet TAKE 1 TABLET BY MOUTH EVERY DAY   B-D UF III MINI PEN NEEDLES 31G X 5 MM MISC USE 1 TO CHECK BLOOD SUGAR 3 TIMES DAILY   carvedilol (COREG) 3.125 MG tablet Take 1 tablet (3.125 mg total) by mouth 2 (two) times daily.   Continuous Blood Gluc Receiver (FREESTYLE LIBRE 14 DAY READER) DEVI 1 Device by Does not apply route every 14 (fourteen) days. for type insulin dependent type 2 diabetes   Continuous Blood Gluc Sensor (FREESTYLE LIBRE 14 DAY SENSOR) MISC Place 1 Device onto the skin every 14 (fourteen) days. for insulin dependent type 2 diabetes   fluticasone (CUTIVATE) 0.05 % cream Apply topically.   furosemide (LASIX) 20 MG tablet TAKE 3 TABLETS BY MOUTH EVERY DAY   insulin degludec (TRESIBA FLEXTOUCH) 100 UNIT/ML FlexTouch Pen INJECT UP TO 20 UNITS UNDER THE SKIN EVERY DAY AT BEDTIME. NEED APPT FOR REFILLS.   magnesium gluconate (MAGONATE) 500 MG tablet Take 500 mg by mouth daily.    Multiple Vitamin (MULTIVITAMIN WITH MINERALS) TABS tablet Take 1 tablet by mouth daily.   naltrexone (DEPADE) 50 MG tablet TAKE 1 TABLET BY MOUTH EVERY DAY   niacin 500 MG tablet Take 500 mg by mouth at bedtime.   NOVOLOG FLEXPEN 100 UNIT/ML  FlexPen INJECT 20 UNITS UP TO THREE TIMES A DAY BEFORE MEALS.   omeprazole (PRILOSEC) 40 MG capsule TAKE 1 CAPSULE BY MOUTH EVERY DAY   ONETOUCH VERIO test strip USE TO CHECK SUGAR THREE TIMES DAILY FOR INSULIN DEPENDENT TYPE 2 DIABETES   PRILOSEC OTC 20 MG tablet 40 mg daily.   promethazine (PHENERGAN) 25 MG tablet TAKE 1 TABLET BY MOUTH EVERY 8 HOURS AS NEEDED FOR NAUSEA OR VOMITING.   pyridOXINE (VITAMIN B-6) 50 MG tablet Take 50 mg by mouth daily.   sertraline (ZOLOFT) 100 MG tablet TAKE 1 TABLET BY MOUTH EVERY DAY   silver sulfADIAZINE (SILVADENE) 1 % cream Apply 1 application topically daily.   traMADol (ULTRAM) 50 MG tablet TAKE 1 TABLET BY MOUTH EVERY 8 HOURS AS NEEDED.   zinc gluconate 50 MG tablet Take 50 mg by mouth daily.   [DISCONTINUED] spironolactone (ALDACTONE) 50 MG tablet Take 1 tablet (50 mg total) by mouth daily.   No facility-administered medications prior to visit.     Objective    BP 104/63 (BP Location: Left Arm, Patient Position: Sitting, Cuff Size: Normal)   Pulse 62   Temp 98.1 F (36.7 C) (Temporal)   Resp 16   Ht 5\' 5"  (1.651 m)   Wt 146 lb 14.4 oz (66.6 kg)   SpO2 99%   BMI 24.45 kg/m   Physical Exam Vitals and nursing  note reviewed.  Constitutional:      Appearance: Normal appearance. He is normal weight.  HENT:     Head: Normocephalic and atraumatic.  Cardiovascular:     Rate and Rhythm: Normal rate and regular rhythm.     Pulses: Normal pulses.     Heart sounds: Murmur heard.  Pulmonary:     Effort: Pulmonary effort is normal.     Breath sounds: Normal breath sounds.  Abdominal:     General: Bowel sounds are normal. There is distension.     Palpations: Abdomen is soft.     Hernia: A hernia is present.    Musculoskeletal:        General: Normal range of motion.     Cervical back: Normal range of motion.  Skin:    General: Skin is warm and dry.     Capillary Refill: Capillary refill takes less than 2 seconds.  Neurological:      General: No focal deficit present.     Mental Status: He is alert and oriented to person, place, and time. Mental status is at baseline.  Psychiatric:        Mood and Affect: Mood normal.        Behavior: Behavior normal.        Thought Content: Thought content normal.        Judgment: Judgment normal.     No results found for any visits on 03/13/23.  Assessment & Plan     Problem List Items Addressed This Visit       Cardiovascular and Mediastinum   Esophageal varices (HCC)   Relevant Medications   spironolactone (ALDACTONE) 100 MG tablet     Digestive   Cirrhosis (HCC)    Chronic, worsening with known ascites Patient continues to prefer Rx mgmt at this time but wishes to establish care with gastro for need for dx para going forward Denies concerns with BM or acute confusion; able to answer questions appropriately and participate in PE Will repeat labs today and increase spiro from 50 to 100 mg every day to assist with mgmt 1 month f/u recommended       Relevant Orders   Ambulatory referral to General Surgery     Endocrine   Type 2 diabetes mellitus with hyperglycemia, with long-term current use of insulin (HCC)    Chronic, unknown Repeat A1c Continue to recommend balanced, lower carb meals. Smaller meal size, adding snacks. Choosing water as drink of choice and increasing purposeful exercise. 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     Leilani Merl, FNP, have reviewed all documentation for this visit. The documentation on 03/13/23 for the exam, diagnosis, procedures, and orders are all accurate and complete.  Jacky Kindle, FNP  [facility]  Practice [phone] (phone) [phone] (fax)  [facility]

## 2023-03-13 NOTE — Assessment & Plan Note (Signed)
Chronic, unknown Repeat A1c Continue to recommend balanced, lower carb meals. Smaller meal size, adding snacks. Choosing water as drink of choice and increasing purposeful exercise. 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

## 2023-03-13 NOTE — Assessment & Plan Note (Signed)
Chronic, worsening with ascites s/s EtoH use/misuse Referral placed to gen surg to discuss repair options given ongoing ascites concerns

## 2023-03-13 NOTE — Assessment & Plan Note (Signed)
Chronic, worsening with known ascites Patient continues to prefer Rx mgmt at this time but wishes to establish care with gastro for need for dx para going forward Denies concerns with BM or acute confusion; able to answer questions appropriately and participate in PE Will repeat labs today and increase spiro from 50 to 100 mg every day to assist with mgmt 1 month f/u recommended

## 2023-03-14 ENCOUNTER — Other Ambulatory Visit: Payer: Self-pay | Admitting: Family Medicine

## 2023-03-14 DIAGNOSIS — D509 Iron deficiency anemia, unspecified: Secondary | ICD-10-CM

## 2023-03-14 LAB — CBC WITH DIFFERENTIAL/PLATELET
Basophils Absolute: 0 10*3/uL (ref 0.0–0.2)
Basos: 1 %
EOS (ABSOLUTE): 0.2 10*3/uL (ref 0.0–0.4)
Eos: 3 %
Hematocrit: 34.6 % — ABNORMAL LOW (ref 37.5–51.0)
Hemoglobin: 11.7 g/dL — ABNORMAL LOW (ref 13.0–17.7)
Immature Grans (Abs): 0 10*3/uL (ref 0.0–0.1)
Immature Granulocytes: 0 %
Lymphocytes Absolute: 1 10*3/uL (ref 0.7–3.1)
Lymphs: 15 %
MCH: 31.3 pg (ref 26.6–33.0)
MCHC: 33.8 g/dL (ref 31.5–35.7)
MCV: 93 fL (ref 79–97)
Monocytes Absolute: 0.9 10*3/uL (ref 0.1–0.9)
Monocytes: 14 %
Neutrophils Absolute: 4.4 10*3/uL (ref 1.4–7.0)
Neutrophils: 67 %
Platelets: 226 10*3/uL (ref 150–450)
RBC: 3.74 x10E6/uL — ABNORMAL LOW (ref 4.14–5.80)
RDW: 13.2 % (ref 11.6–15.4)
WBC: 6.4 10*3/uL (ref 3.4–10.8)

## 2023-03-14 LAB — COMPREHENSIVE METABOLIC PANEL
ALT: 12 IU/L (ref 0–44)
AST: 25 IU/L (ref 0–40)
Albumin: 3.8 g/dL (ref 3.8–4.8)
Alkaline Phosphatase: 222 IU/L — ABNORMAL HIGH (ref 44–121)
BUN/Creatinine Ratio: 10 (ref 10–24)
BUN: 7 mg/dL — ABNORMAL LOW (ref 8–27)
Bilirubin Total: 1.5 mg/dL — ABNORMAL HIGH (ref 0.0–1.2)
CO2: 26 mmol/L (ref 20–29)
Calcium: 8.9 mg/dL (ref 8.6–10.2)
Chloride: 93 mmol/L — ABNORMAL LOW (ref 96–106)
Creatinine, Ser: 0.68 mg/dL — ABNORMAL LOW (ref 0.76–1.27)
Globulin, Total: 3.2 g/dL (ref 1.5–4.5)
Glucose: 249 mg/dL — ABNORMAL HIGH (ref 70–99)
Potassium: 5.4 mmol/L — ABNORMAL HIGH (ref 3.5–5.2)
Sodium: 129 mmol/L — ABNORMAL LOW (ref 134–144)
Total Protein: 7 g/dL (ref 6.0–8.5)
eGFR: 99 mL/min/{1.73_m2} (ref >59)

## 2023-03-14 LAB — PROTIME-INR
INR: 1.2 (ref 0.9–1.2)
Prothrombin Time: 13.2 s — ABNORMAL HIGH (ref 9.1–12.0)

## 2023-03-14 LAB — LIPID PANEL
Chol/HDL Ratio: 2 ratio (ref 0.0–5.0)
Cholesterol, Total: 88 mg/dL — ABNORMAL LOW (ref 100–199)
HDL: 43 mg/dL (ref >39)
LDL Chol Calc (NIH): 32 mg/dL (ref 0–99)
Triglycerides: 51 mg/dL (ref 0–149)
VLDL Cholesterol Cal: 13 mg/dL (ref 5–40)

## 2023-03-14 LAB — HEMOGLOBIN A1C
Est. average glucose Bld gHb Est-mCnc: 143 mg/dL
Hgb A1c MFr Bld: 6.6 % — ABNORMAL HIGH (ref 4.8–5.6)

## 2023-03-14 LAB — MICROALBUMIN / CREATININE URINE RATIO
Creatinine, Urine: 28 mg/dL
Microalb/Creat Ratio: 18 mg/g{creat} (ref 0–29)
Microalbumin, Urine: 4.9 ug/mL

## 2023-03-14 LAB — B12 AND FOLATE PANEL
Folate: 12.8 ng/mL (ref >3.0)
Vitamin B-12: 1311 pg/mL — ABNORMAL HIGH (ref 232–1245)

## 2023-03-14 LAB — APTT: aPTT: 31 s (ref 24–33)

## 2023-03-16 ENCOUNTER — Other Ambulatory Visit: Payer: Self-pay | Admitting: Gastroenterology

## 2023-03-19 ENCOUNTER — Inpatient Hospital Stay: Payer: PPO

## 2023-03-19 ENCOUNTER — Inpatient Hospital Stay: Payer: PPO | Attending: Oncology | Admitting: Oncology

## 2023-03-19 ENCOUNTER — Encounter: Payer: Self-pay | Admitting: Oncology

## 2023-03-19 VITALS — BP 108/78 | HR 70 | Temp 96.4°F | Wt 141.9 lb

## 2023-03-19 DIAGNOSIS — Z9049 Acquired absence of other specified parts of digestive tract: Secondary | ICD-10-CM | POA: Diagnosis not present

## 2023-03-19 DIAGNOSIS — Z8249 Family history of ischemic heart disease and other diseases of the circulatory system: Secondary | ICD-10-CM | POA: Diagnosis not present

## 2023-03-19 DIAGNOSIS — Z79899 Other long term (current) drug therapy: Secondary | ICD-10-CM | POA: Diagnosis not present

## 2023-03-19 DIAGNOSIS — F1021 Alcohol dependence, in remission: Secondary | ICD-10-CM | POA: Insufficient documentation

## 2023-03-19 DIAGNOSIS — D7589 Other specified diseases of blood and blood-forming organs: Secondary | ICD-10-CM | POA: Insufficient documentation

## 2023-03-19 DIAGNOSIS — E119 Type 2 diabetes mellitus without complications: Secondary | ICD-10-CM | POA: Insufficient documentation

## 2023-03-19 DIAGNOSIS — Z833 Family history of diabetes mellitus: Secondary | ICD-10-CM | POA: Diagnosis not present

## 2023-03-19 DIAGNOSIS — Z83719 Family history of colon polyps, unspecified: Secondary | ICD-10-CM | POA: Insufficient documentation

## 2023-03-19 DIAGNOSIS — Z818 Family history of other mental and behavioral disorders: Secondary | ICD-10-CM | POA: Diagnosis not present

## 2023-03-19 DIAGNOSIS — K439 Ventral hernia without obstruction or gangrene: Secondary | ICD-10-CM | POA: Diagnosis not present

## 2023-03-19 DIAGNOSIS — D649 Anemia, unspecified: Secondary | ICD-10-CM

## 2023-03-19 DIAGNOSIS — K219 Gastro-esophageal reflux disease without esophagitis: Secondary | ICD-10-CM | POA: Diagnosis not present

## 2023-03-19 DIAGNOSIS — F32A Depression, unspecified: Secondary | ICD-10-CM | POA: Diagnosis not present

## 2023-03-19 DIAGNOSIS — I10 Essential (primary) hypertension: Secondary | ICD-10-CM | POA: Insufficient documentation

## 2023-03-19 DIAGNOSIS — Z9089 Acquired absence of other organs: Secondary | ICD-10-CM | POA: Insufficient documentation

## 2023-03-19 DIAGNOSIS — Z801 Family history of malignant neoplasm of trachea, bronchus and lung: Secondary | ICD-10-CM | POA: Insufficient documentation

## 2023-03-19 DIAGNOSIS — Z8616 Personal history of COVID-19: Secondary | ICD-10-CM | POA: Diagnosis not present

## 2023-03-19 DIAGNOSIS — R5383 Other fatigue: Secondary | ICD-10-CM | POA: Diagnosis not present

## 2023-03-19 DIAGNOSIS — K7030 Alcoholic cirrhosis of liver without ascites: Secondary | ICD-10-CM | POA: Insufficient documentation

## 2023-03-19 DIAGNOSIS — Z87442 Personal history of urinary calculi: Secondary | ICD-10-CM | POA: Insufficient documentation

## 2023-03-19 DIAGNOSIS — D509 Iron deficiency anemia, unspecified: Secondary | ICD-10-CM | POA: Insufficient documentation

## 2023-03-19 DIAGNOSIS — Z8719 Personal history of other diseases of the digestive system: Secondary | ICD-10-CM | POA: Insufficient documentation

## 2023-03-19 LAB — RETICULOCYTES
Immature Retic Fract: 1.7 % — ABNORMAL LOW (ref 2.3–15.9)
RBC.: 3.57 MIL/uL — ABNORMAL LOW (ref 4.22–5.81)
Retic Count, Absolute: 44.6 10*3/uL (ref 19.0–186.0)
Retic Ct Pct: 1.3 % (ref 0.4–3.1)

## 2023-03-19 LAB — LACTATE DEHYDROGENASE: LDH: 119 U/L (ref 98–192)

## 2023-03-19 LAB — IRON AND TIBC
Iron: 115 ug/dL (ref 45–182)
Saturation Ratios: 33 % (ref 17.9–39.5)
TIBC: 354 ug/dL (ref 250–450)
UIBC: 239 ug/dL

## 2023-03-19 LAB — FERRITIN: Ferritin: 63 ng/mL (ref 24–336)

## 2023-03-19 NOTE — Progress Notes (Signed)
Hematology/Oncology Consult note [facility] [phone] [phone]  Patient Care Team: Malva Limes, MD as PCP - General (Family Medicine) Midge Minium, MD as Consulting Physician (Gastroenterology) Cherre Huger, DC as Referring Physician (Chiropractic Medicine) Pcp, No [place], [facility])   Name of the patient: [name]  [id number]  Nov 16, 1950    Reason for referral-iron deficiency anemia   Referring physician-Dr. Sherrie Mustache  Date of visit: 03/19/23   History of presenting illness- Patient is a 72 year old male with a past medical history significant for multiple medical issues including hypertension depression, cirrhosis of the liver referred for iron deficiency anemia.  CBC from 03/13/2023 showed an H&H of 11.7/34.6 with an MCV of 93.  White count and platelets were normal.  B12 levels were elevated at 1311.  Folate normal at 12.8.  No recent iron studies have been checked.  ECOG PS- 1  Pain scale- 0   Review of systems- Review of Systems  Constitutional:  Positive for malaise/fatigue. Negative for chills, fever and weight loss.  HENT:  Negative for congestion, ear discharge and nosebleeds.   Eyes:  Negative for blurred vision.  Respiratory:  Negative for cough, hemoptysis, sputum production, shortness of breath and wheezing.   Cardiovascular:  Negative for chest pain, palpitations, orthopnea and claudication.  Gastrointestinal:  Negative for abdominal pain, blood in stool, constipation, diarrhea, heartburn, melena, nausea and vomiting.  Genitourinary:  Negative for dysuria, flank pain, frequency, hematuria and urgency.  Musculoskeletal:  Negative for back pain, joint pain and myalgias.  Skin:  Negative for rash.  Neurological:  Negative for dizziness, tingling, focal weakness, seizures, weakness and headaches.  Endo/Heme/Allergies:  Does not bruise/bleed easily.  Psychiatric/Behavioral:  Negative for depression  and suicidal ideas. The patient does not have insomnia.     Allergies  Allergen Reactions   Hydrochlorothiazide Other (See Comments)    Reaction:  Hyponatremia    Patient Active Problem List   Diagnosis Date Noted   Ventral hernia without obstruction or gangrene 03/13/2023   Incisional hernia, without obstruction or gangrene 10/23/2022   Alcohol dependence in remission (HCC) 08/15/2021   Closed fracture of heel bone 05/11/2021   Primary localized osteoarthrosis of ankle and foot 05/11/2021   Ankle pain 05/11/2021   Sprain of right ankle 05/11/2021   Gynecomastia, male 02/19/2020   Gastric varices    Hyperuricemia 10/02/2016   Alcohol abuse 03/30/2015   Dermatitis, eczematoid 03/20/2015   GERD (gastroesophageal reflux disease) 03/20/2015   History of Helicobacter pylori infection 03/20/2015   Hearing loss of right ear 03/20/2015   Hernia, inguinal, left 03/20/2015   Supraspinatus sprain and strain 03/20/2015   Cirrhosis (HCC) 03/20/2015   Scalp laceration 02/28/2015   Rib contusion 02/28/2015   Splenic vein thrombosis    History of portal hypertension    Blood clotting disorder (HCC) 02/11/2014   Odynophagia 02/10/2014   Abnormal liver enzymes 12/08/2013   Esophagitis 12/08/2013   Gastric catarrh 12/08/2013   H/O adenomatous polyp of colon 09/23/2012   Esophageal varices (HCC) 09/09/2012   DDD (degenerative disc disease), cervical 08/17/2011   Vitamin D deficiency 08/19/2009   Type 2 diabetes mellitus with hyperglycemia, with long-term current use of insulin (HCC) 10/01/2007   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   Depression 05/13/2007   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   Obesity 07/09/2005   Hypertension 07/09/1998     Past Medical History:  Diagnosis Date   Alcoholism (HCC)  Anxiety    Arthritis    Cancer (HCC)    Cirrhosis with alcoholism (HCC)    Depression    Diabetes mellitus without complication (HCC)     Dyspnea    Elevated liver enzymes    Esophageal varices (HCC)    Gastritis    GERD (gastroesophageal reflux disease)    Hyperlipidemia    Hypertension    Pancreatitis      Past Surgical History:  Procedure Laterality Date   CHOLECYSTECTOMY  2006   COLONOSCOPY WITH ESOPHAGOGASTRODUODENOSCOPY (EGD)     COLONOSCOPY WITH PROPOFOL N/A 11/21/2015   Procedure: COLONOSCOPY WITH PROPOFOL;  Surgeon: Scot Jun, MD;  Location: [facility] ENDOSCOPY;  Service: Endoscopy;  Laterality: N/A;   ESOPHAGOGASTRODUODENOSCOPY (EGD) WITH PROPOFOL N/A 08/21/2019   Procedure: ESOPHAGOGASTRODUODENOSCOPY (EGD) WITH PROPOFOL;  Surgeon: Midge Minium, MD;  Location: [facility] ENDOSCOPY;  Service: Endoscopy;  Laterality: N/A;   HERNIA REPAIR     Inguinal Hernia Repai. Left: 07/28/2013 Dr. Egbert Garibaldi; 2nd repair done 04/30/2014   TONSILLECTOMY      Social History   Socioeconomic History   Marital status: Married    Spouse name: Candy   Number of children: 3   Years of education: Not on file   Highest education level: 12th grade  Occupational History   Occupation: retired  Tobacco Use   Smoking status: Never   Smokeless tobacco: Never  Vaping Use   Vaping status: Never Used  Substance and Sexual Activity   Alcohol use: Not Currently    Comment: previous heavy drinker   Drug use: No   Sexual activity: Not Currently  Other Topics Concern   Not on file  Social History Narrative   Not on file   Social Determinants of Health   Financial Resource Strain: Low Risk  (02/24/2023)   Overall Financial Resource Strain (CARDIA)    Difficulty of Paying Living Expenses: Not hard at all  Food Insecurity: No Food Insecurity (03/19/2023)   Hunger Vital Sign    Worried About Running Out of Food in the Last Year: Never true    Ran Out of Food in the Last Year: Never true  Transportation Needs: No Transportation Needs (03/19/2023)   PRAPARE - Administrator, Civil Service (Medical): No    Lack of Transportation  (Non-Medical): No  Physical Activity: Insufficiently Active (02/24/2023)   Exercise Vital Sign    Days of Exercise per Week: 7 days    Minutes of Exercise per Session: 20 min  Stress: No Stress Concern Present (02/24/2023)   [employer] of Occupational Health - Occupational Stress Questionnaire    Feeling of Stress : Not at all  Social Connections: Moderately Integrated (02/24/2023)   Social Connection and Isolation Panel [NHANES]    Frequency of Communication with Friends and Family: More than three times a week    Frequency of Social Gatherings with Friends and Family: Twice a week    Attends Religious Services: More than 4 times per year    Active Member of [employer] or Organizations: No    Attends Banker Meetings: Never    Marital Status: Married  Recent Concern: Social Connections - Moderately Isolated (02/20/2023)   Social Connection and Isolation Panel [NHANES]    Frequency of Communication with Friends and Family: More than three times a week    Frequency of Social Gatherings with Friends and Family: More than three times a week    Attends Religious Services: Never    Active Member of  Clubs or Organizations: No    Attends Banker Meetings: Never    Marital Status: Married  Catering manager Violence: Not At Risk (03/19/2023)   Humiliation, Afraid, Rape, and Kick questionnaire    Fear of Current or Ex-Partner: No    Emotionally Abused: No    Physically Abused: No    Sexually Abused: No     Family History  Problem Relation Age of Onset   Colon polyps Father    Lung cancer Father    Hypertension Father    Dementia Mother    Hypertension Brother    Heart Problems Brother    Hypertension Brother    Diabetes Brother        type 2   Congestive Heart Failure Brother      Current Outpatient Medications:    Accu-Chek Softclix Lancets lancets, Use to check glucose ac and as needed, Disp: 100 each, Rfl: 12   acetaminophen (TYLENOL) 500 MG tablet,  Take 1,000 mg by mouth every 6 (six) hours as needed., Disp: , Rfl:    allopurinol (ZYLOPRIM) 100 MG tablet, TAKE 1 TABLET BY MOUTH EVERY DAY, Disp: 90 tablet, Rfl: 4   B-D UF III MINI PEN NEEDLES 31G X 5 MM MISC, USE 1 TO CHECK BLOOD SUGAR 3 TIMES DAILY, Disp: 100 each, Rfl: 4   carvedilol (COREG) 3.125 MG tablet, TAKE 1 TABLET BY MOUTH 2 TIMES DAILY., Disp: 180 tablet, Rfl: 0   Continuous Blood Gluc Receiver (FREESTYLE LIBRE 14 DAY READER) DEVI, 1 Device by Does not apply route every 14 (fourteen) days. for type insulin dependent type 2 diabetes, Disp: 1 each, Rfl: 12   Continuous Blood Gluc Sensor (FREESTYLE LIBRE 14 DAY SENSOR) MISC, Place 1 Device onto the skin every 14 (fourteen) days. for insulin dependent type 2 diabetes, Disp: 2 each, Rfl: 12   fluticasone (CUTIVATE) 0.05 % cream, Apply topically., Disp: , Rfl:    furosemide (LASIX) 20 MG tablet, TAKE 3 TABLETS BY MOUTH EVERY DAY, Disp: 270 tablet, Rfl: 0   insulin degludec (TRESIBA FLEXTOUCH) 100 UNIT/ML FlexTouch Pen, INJECT UP TO 20 UNITS UNDER THE SKIN EVERY DAY AT BEDTIME. NEED APPT FOR REFILLS., Disp: 9 mL, Rfl: 1   magnesium gluconate (MAGONATE) 500 MG tablet, Take 500 mg by mouth daily. , Disp: , Rfl:    Multiple Vitamin (MULTIVITAMIN WITH MINERALS) TABS tablet, Take 1 tablet by mouth daily., Disp: , Rfl:    naltrexone (DEPADE) 50 MG tablet, TAKE 1 TABLET BY MOUTH EVERY DAY, Disp: 90 tablet, Rfl: 1   niacin 500 MG tablet, Take 500 mg by mouth at bedtime., Disp: , Rfl:    NOVOLOG FLEXPEN 100 UNIT/ML FlexPen, INJECT 20 UNITS UP TO THREE TIMES A DAY BEFORE MEALS., Disp: 45 mL, Rfl: 0   omeprazole (PRILOSEC) 40 MG capsule, TAKE 1 CAPSULE BY MOUTH EVERY DAY, Disp: 90 capsule, Rfl: 1   ONETOUCH VERIO test strip, USE TO CHECK SUGAR THREE TIMES DAILY FOR INSULIN DEPENDENT TYPE 2 DIABETES, Disp: 100 strip, Rfl: 4   PRILOSEC OTC 20 MG tablet, 40 mg daily., Disp: , Rfl:    promethazine (PHENERGAN) 25 MG tablet, TAKE 1 TABLET BY MOUTH EVERY 8  HOURS AS NEEDED FOR NAUSEA OR VOMITING., Disp: 20 tablet, Rfl: 3   pyridOXINE (VITAMIN B-6) 50 MG tablet, Take 50 mg by mouth daily., Disp: , Rfl:    sertraline (ZOLOFT) 100 MG tablet, TAKE 1 TABLET BY MOUTH EVERY DAY, Disp: 90 tablet, Rfl: 4   silver  sulfADIAZINE (SILVADENE) 1 % cream, Apply 1 application topically daily., Disp: 50 g, Rfl: 0   spironolactone (ALDACTONE) 100 MG tablet, Take 1 tablet (100 mg total) by mouth daily., Disp: 90 tablet, Rfl: 0   traMADol (ULTRAM) 50 MG tablet, TAKE 1 TABLET BY MOUTH EVERY 8 HOURS AS NEEDED., Disp: 15 tablet, Rfl: 2   zinc gluconate 50 MG tablet, Take 50 mg by mouth daily., Disp: , Rfl:    Physical exam:  Vitals:   03/19/23 1354  BP: 108/78  Pulse: 70  Temp: (!) 96.4 F (35.8 C)  TempSrc: Tympanic  SpO2: 100%  Weight: 141 lb 14.4 oz (64.4 kg)   Physical Exam Cardiovascular:     Rate and Rhythm: Normal rate and regular rhythm.     Heart sounds: Normal heart sounds.  Pulmonary:     Effort: Pulmonary effort is normal.     Breath sounds: Normal breath sounds.  Abdominal:     General: Bowel sounds are normal.     Palpations: Abdomen is soft.     Comments: Splenic tip palpable  Skin:    General: Skin is warm and dry.  Neurological:     Mental Status: He is alert and oriented to person, place, and time.           Latest Ref Rng & Units 03/13/2023    3:37 PM  CMP  Glucose 70 - 99 mg/dL 629   BUN 8 - 27 mg/dL 7   Creatinine 5.28 - 4.13 mg/dL 2.44   Sodium 010 - 272 mmol/L 129   Potassium 3.5 - 5.2 mmol/L 5.4   Chloride 96 - 106 mmol/L 93   CO2 20 - 29 mmol/L 26   Calcium 8.6 - 10.2 mg/dL 8.9   Total Protein 6.0 - 8.5 g/dL 7.0   Total Bilirubin 0.0 - 1.2 mg/dL 1.5   Alkaline Phos 44 - 121 IU/L 222   AST 0 - 40 IU/L 25   ALT 0 - 44 IU/L 12       Latest Ref Rng & Units 03/13/2023    3:37 PM  CBC  WBC 3.4 - 10.8 x10E3/uL 6.4   Hemoglobin 13.0 - 17.7 g/dL 53.6   Hematocrit 64.4 - 51.0 % 34.6   Platelets 150 - 450 x10E3/uL 226      Assessment and plan- Patient is a 72 y.o. male referred for normocytic anemia  Patient's baseline hemoglobin runs between 13-14.  It has drifted down to 11.7 recently.  B12 and folate levels are normal.  No recent iron studies were checked.  I will check ferritin and iron studies reticulocyte count haptoglobin myeloma panel and serum free light chains today.In person or video visit with me in 2 weeks time   Thank you for this kind referral and the opportunity to participate in the care of this patient   Visit Diagnosis 1. Normocytic anemia     Dr. Owens Shark, MD, MPH [facility] [phone] 03/19/2023

## 2023-03-20 LAB — KAPPA/LAMBDA LIGHT CHAINS
Kappa free light chain: 64.3 mg/L — ABNORMAL HIGH (ref 3.3–19.4)
Kappa, lambda light chain ratio: 1.33 (ref 0.26–1.65)
Lambda free light chains: 48.3 mg/L — ABNORMAL HIGH (ref 5.7–26.3)

## 2023-03-21 LAB — HAPTOGLOBIN: Haptoglobin: 70 mg/dL (ref 34–355)

## 2023-03-22 LAB — MULTIPLE MYELOMA PANEL, SERUM
Albumin SerPl Elph-Mcnc: 3.2 g/dL (ref 2.9–4.4)
Albumin/Glob SerPl: 1 (ref 0.7–1.7)
Alpha 1: 0.2 g/dL (ref 0.0–0.4)
Alpha2 Glob SerPl Elph-Mcnc: 0.4 g/dL (ref 0.4–1.0)
B-Globulin SerPl Elph-Mcnc: 0.8 g/dL (ref 0.7–1.3)
Gamma Glob SerPl Elph-Mcnc: 1.8 g/dL (ref 0.4–1.8)
Globulin, Total: 3.3 g/dL (ref 2.2–3.9)
IgA: 538 mg/dL — ABNORMAL HIGH (ref 61–437)
IgG (Immunoglobin G), Serum: 1671 mg/dL — ABNORMAL HIGH (ref 603–1613)
IgM (Immunoglobulin M), Srm: 412 mg/dL — ABNORMAL HIGH (ref 15–143)
Total Protein ELP: 6.5 g/dL (ref 6.0–8.5)

## 2023-03-28 ENCOUNTER — Ambulatory Visit: Payer: HMO | Admitting: Surgery

## 2023-03-28 ENCOUNTER — Encounter: Payer: Self-pay | Admitting: Surgery

## 2023-03-28 VITALS — BP 107/68 | HR 66 | Temp 98.5°F | Ht 65.0 in | Wt 140.0 lb

## 2023-03-28 DIAGNOSIS — R188 Other ascites: Secondary | ICD-10-CM | POA: Diagnosis not present

## 2023-03-28 DIAGNOSIS — K429 Umbilical hernia without obstruction or gangrene: Secondary | ICD-10-CM | POA: Diagnosis not present

## 2023-03-28 DIAGNOSIS — K432 Incisional hernia without obstruction or gangrene: Secondary | ICD-10-CM

## 2023-03-28 NOTE — H&P (View-Only) (Signed)
Patient ID: [name], male   DOB: 06/12/1951, 72 y.o.   MRN: [id number]  Chief Complaint: Umbilical hernia  History of Present Illness Previously scheduled for hernia repair October 31, 2022.  Canceled, due to unanticipated moderate volume of ascites. Right inguinal hernia containing fluid and fat noted on last CT, with cirrhosis.    Currently reports he is taking sufficient diuretics, and notes diminished abdominal girth.  Definitely wishes to pursue hernia repair as this is troublesome.  He has a visit with oncology next week, and may have additional diagnoses that may prioritize his care and force defer of surgery once again.  Previous visit: [name] is a 72 y.o. male with tender knot at the navel over the last 2 weeks.  Progressively more tender, often having a bulge which seems to spontaneously reduce when recumbent.  Sometimes the pain is severe is a 6-10.  Associated nausea.  Bowel movements appear to be loose and denying constipation.  Had prior laparoscopic cholecystectomy with supra umbilical incision.   Prior history of left inguinal hernia repair, x 2.  Past Medical History Past Medical History:  Diagnosis Date   Alcoholism (HCC)    Anxiety    Arthritis    Cancer (HCC)    Cirrhosis with alcoholism (HCC)    Depression    Diabetes mellitus without complication (HCC)    Dyspnea    Elevated liver enzymes    Esophageal varices (HCC)    Gastritis    GERD (gastroesophageal reflux disease)    Hyperlipidemia    Hypertension    Pancreatitis       Past Surgical History:  Procedure Laterality Date   CHOLECYSTECTOMY  2006   COLONOSCOPY WITH ESOPHAGOGASTRODUODENOSCOPY (EGD)     COLONOSCOPY WITH PROPOFOL N/A 11/21/2015   Procedure: COLONOSCOPY WITH PROPOFOL;  Surgeon: Scot Jun, MD;  Location: [facility] ENDOSCOPY;  Service: Endoscopy;  Laterality: N/A;   ESOPHAGOGASTRODUODENOSCOPY (EGD) WITH PROPOFOL N/A 08/21/2019   Procedure: ESOPHAGOGASTRODUODENOSCOPY (EGD) WITH  PROPOFOL;  Surgeon: James Minium, MD;  Location: [facility] ENDOSCOPY;  Service: Endoscopy;  Laterality: N/A;   HERNIA REPAIR     Inguinal Hernia Repai. Left: 07/28/2013 Dr. Egbert Moss; 2nd repair done 04/30/2014   TONSILLECTOMY      Allergies  Allergen Reactions   Hydrochlorothiazide Other (See Comments)    Reaction:  Hyponatremia    Current Outpatient Medications  Medication Sig Dispense Refill   Accu-Chek Softclix Lancets lancets Use to check glucose ac and as needed 100 each 12   acetaminophen (TYLENOL) 500 MG tablet Take 1,000 mg by mouth every 6 (six) hours as needed.     allopurinol (ZYLOPRIM) 100 MG tablet TAKE 1 TABLET BY MOUTH EVERY DAY 90 tablet 4   B-D UF III MINI PEN NEEDLES 31G X 5 MM MISC USE 1 TO CHECK BLOOD SUGAR 3 TIMES DAILY 100 each 4   carvedilol (COREG) 3.125 MG tablet TAKE 1 TABLET BY MOUTH 2 TIMES DAILY. 180 tablet 0   Continuous Blood Gluc Receiver (FREESTYLE LIBRE 14 DAY READER) DEVI 1 Device by Does not apply route every 14 (fourteen) days. for type insulin dependent type 2 diabetes 1 each 12   Continuous Blood Gluc Sensor (FREESTYLE LIBRE 14 DAY SENSOR) MISC Place 1 Device onto the skin every 14 (fourteen) days. for insulin dependent type 2 diabetes 2 each 12   fluticasone (CUTIVATE) 0.05 % cream Apply topically.     furosemide (LASIX) 20 MG tablet TAKE 3 TABLETS BY MOUTH EVERY DAY 270 tablet  0   insulin degludec (TRESIBA FLEXTOUCH) 100 UNIT/ML FlexTouch Pen INJECT UP TO 20 UNITS UNDER THE SKIN EVERY DAY AT BEDTIME. NEED APPT FOR REFILLS. 9 mL 1   magnesium gluconate (MAGONATE) 500 MG tablet Take 500 mg by mouth daily.      Multiple Vitamin (MULTIVITAMIN WITH MINERALS) TABS tablet Take 1 tablet by mouth daily.     naltrexone (DEPADE) 50 MG tablet TAKE 1 TABLET BY MOUTH EVERY DAY 90 tablet 1   niacin 500 MG tablet Take 500 mg by mouth at bedtime.     NOVOLOG FLEXPEN 100 UNIT/ML FlexPen INJECT 20 UNITS UP TO THREE TIMES A DAY BEFORE MEALS. 45 mL 0   omeprazole (PRILOSEC) 40  MG capsule TAKE 1 CAPSULE BY MOUTH EVERY DAY 90 capsule 1   ONETOUCH VERIO test strip USE TO CHECK SUGAR THREE TIMES DAILY FOR INSULIN DEPENDENT TYPE 2 DIABETES 100 strip 4   PRILOSEC OTC 20 MG tablet 40 mg daily.     promethazine (PHENERGAN) 25 MG tablet TAKE 1 TABLET BY MOUTH EVERY 8 HOURS AS NEEDED FOR NAUSEA OR VOMITING. 20 tablet 3   pyridOXINE (VITAMIN B-6) 50 MG tablet Take 50 mg by mouth daily.     sertraline (ZOLOFT) 100 MG tablet TAKE 1 TABLET BY MOUTH EVERY DAY 90 tablet 4   silver sulfADIAZINE (SILVADENE) 1 % cream Apply 1 application topically daily. 50 g 0   spironolactone (ALDACTONE) 100 MG tablet Take 1 tablet (100 mg total) by mouth daily. 90 tablet 0   traMADol (ULTRAM) 50 MG tablet TAKE 1 TABLET BY MOUTH EVERY 8 HOURS AS NEEDED. 15 tablet 2   zinc gluconate 50 MG tablet Take 50 mg by mouth daily.     No current facility-administered medications for this visit.    Family History Family History  Problem Relation Age of Onset   Colon polyps Father    Lung cancer Father    Hypertension Father    Dementia Mother    Hypertension Brother    Heart Problems Brother    Hypertension Brother    Diabetes Brother        type 2   Congestive Heart Failure Brother       Social History Social History   Tobacco Use   Smoking status: Never   Smokeless tobacco: Never  Vaping Use   Vaping status: Never Used  Substance Use Topics   Alcohol use: Not Currently    Comment: previous heavy drinker   Drug use: No        Review of Systems  Constitutional: Negative.   HENT: Negative.    Eyes: Negative.   Respiratory: Negative.    Cardiovascular: Negative.   Gastrointestinal:  Positive for abdominal pain, diarrhea, heartburn, nausea and vomiting. Negative for blood in stool, constipation and melena.  Genitourinary: Negative.   Skin: Negative.   Neurological: Negative.   Psychiatric/Behavioral: Negative.       Physical Exam Blood pressure 107/68, pulse 66, temperature  98.5 F (36.9 C), temperature source Oral, height 5\' 5"  (1.651 m), weight 140 lb (63.5 kg), SpO2 100%. Last Weight  Most recent update: 03/28/2023  3:19 PM    Weight  63.5 kg (140 lb)              CONSTITUTIONAL: Well developed, and nourished, appropriately responsive and aware without distress.   EYES: Sclera non-icteric.   EARS, NOSE, MOUTH AND THROAT:  The oropharynx is clear. Oral mucosa is pink and moist.  Hearing is intact to voice.  NECK: Trachea is midline, and there is no jugular venous distension.  LYMPH NODES:  Lymph nodes in the neck are not appreciated. RESPIRATORY:  Lungs are clear, and breath sounds are equal bilaterally.  Normal respiratory effort without pathologic use of accessory muscles. CARDIOVASCULAR: Heart is regular in rate and rhythm.   Well perfused.  GI: The abdomen is notable for a small tender bulge at the cephalad aspect of the umbilicus under an adjacent scar, otherwise soft, nontender, and nondistended. There were no palpable masses.  I did not appreciate hepatosplenomegaly.  No appreciable ascites on clinical exam today. MUSCULOSKELETAL:  Symmetrical muscle tone appreciated in all four extremities.    SKIN: Skin turgor is normal. No pathologic skin lesions appreciated.  NEUROLOGIC:  Motor and sensation appear grossly normal.  Cranial nerves are grossly without defect. PSYCH:  Alert and oriented to person, place and time. Affect is appropriate for situation.  Data Reviewed I have personally reviewed what is currently available of the patient's imaging, recent labs and medical records.   Labs:     Latest Ref Rng & Units 03/13/2023    3:37 PM 10/29/2022   11:11 AM 03/28/2022   10:16 AM  CBC  WBC 3.4 - 10.8 x10E3/uL 6.4  6.6  7.2   Hemoglobin 13.0 - 17.7 g/dL 62.9  52.8  41.3   Hematocrit 37.5 - 51.0 % 34.6  43.5  40.6   Platelets 150 - 450 x10E3/uL 226  193  178       Latest Ref Rng & Units 03/13/2023    3:37 PM 02/25/2023    1:00 PM 12/14/2022   11:31  AM  CMP  Glucose 70 - 99 mg/dL 244  010  272   BUN 8 - 27 mg/dL 7  8  7    Creatinine 0.76 - 1.27 mg/dL 5.36  6.44  0.34   Sodium 134 - 144 mmol/L 129  133  135   Potassium 3.5 - 5.2 mmol/L 5.4  4.3  5.1   Chloride 96 - 106 mmol/L 93  96  95   CO2 20 - 29 mmol/L 26  26  27    Calcium 8.6 - 10.2 mg/dL 8.9  8.5  8.6   Total Protein 6.0 - 8.5 g/dL 7.0  6.2    Total Bilirubin 0.0 - 1.2 mg/dL 1.5  1.5    Alkaline Phos 44 - 121 IU/L 222  214    AST 0 - 40 IU/L 25  26    ALT 0 - 44 IU/L 12  13        Imaging: Radiological images reviewed:  CLINICAL DATA:  Cirrhosis periumbilical swelling nausea   EXAM: CT ABDOMEN AND PELVIS WITH CONTRAST   TECHNIQUE: Multidetector CT imaging of the abdomen and pelvis was performed using the standard protocol following bolus administration of intravenous contrast.   RADIATION DOSE REDUCTION: This exam was performed according to the departmental dose-optimization program which includes automated exposure control, adjustment of the mA and/or kV according to patient size and/or use of iterative reconstruction technique.   CONTRAST:  85mL OMNIPAQUE IOHEXOL 300 MG/ML  SOLN   COMPARISON:  Ultrasound 02/21/2021, CT 12/08/2014   FINDINGS: Lower chest: Lung bases demonstrate linear atelectasis or scarring at the bases. No acute airspace disease. Ascending aortic diameter up to 4 cm. Gynecomastia. Coronary vascular calcification.   Hepatobiliary: Liver cirrhosis. Status post cholecystectomy. No biliary dilatation   Pancreas: Atrophic. No pancreatic ductal dilatation or surrounding inflammatory  changes.   Spleen: Enlarged measuring 15.4 cm   Adrenals/Urinary Tract: Adrenal glands are normal. Kidneys show no hydronephrosis. The bladder is unremarkable.   Stomach/Bowel: The stomach is nonenlarged. No dilated small bowel. Colon wall thickening versus under distended hepatic flexure and transverse colon. Negative appendix.   Vascular/Lymphatic:  Moderate aortic atherosclerosis. No aneurysm. Subcentimeter retroperitoneal lymph nodes. Subcentimeter peripancreatic nodes. 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   Reproductive: Prostate slightly enlarged   Other: No free air. Moderate volume of ascites. Right inguinal hernia containing fluid and fat   Musculoskeletal: No acute osseous abnormality. Grade 1 anterolisthesis L4 on L5 with advanced degenerative changes   IMPRESSION: 1. Liver cirrhosis with splenomegaly and moderate volume of ascites. 2. 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 Colon wall thickening versus under distension of the hepatic flexure and transverse colon. 4. Aortic atherosclerosis.   Aortic Atherosclerosis (ICD10-I70.0).     Electronically Signed   By: Jasmine Pang M.D.   On: 11/03/2022 03:23 Within last 24 hrs: No results found.  Assessment    Incisional hernia, reducible.  Estimated defect size with umbilical hernia defect approximately 3 cm or more.  Ascites seems to be controlled. Patient Active Problem List   Diagnosis Date Noted   Ventral hernia without obstruction or gangrene 03/13/2023   Incisional hernia, without obstruction or gangrene 10/23/2022   Alcohol dependence in remission (HCC) 08/15/2021   Closed fracture of heel bone 05/11/2021   Primary localized osteoarthrosis of ankle and foot 05/11/2021   Ankle pain 05/11/2021   Sprain of right ankle 05/11/2021   Gynecomastia, male 02/19/2020   Gastric varices    Hyperuricemia 10/02/2016   Alcohol abuse 03/30/2015   Dermatitis, eczematoid 03/20/2015   GERD (gastroesophageal reflux disease) 03/20/2015   History of Helicobacter pylori infection 03/20/2015   Hearing loss of right ear 03/20/2015   Hernia, inguinal, left 03/20/2015   Supraspinatus sprain and strain 03/20/2015   Cirrhosis (HCC) 03/20/2015   Scalp laceration 02/28/2015   Rib contusion 02/28/2015    Splenic vein thrombosis    History of portal hypertension    Blood clotting disorder (HCC) 02/11/2014   Odynophagia 02/10/2014   Abnormal liver enzymes 12/08/2013   Esophagitis 12/08/2013   Gastric catarrh 12/08/2013   H/O adenomatous polyp of colon 09/23/2012   Esophageal varices (HCC) 09/09/2012   DDD (degenerative disc disease), cervical 08/17/2011   Vitamin D deficiency 08/19/2009   Type 2 diabetes mellitus with hyperglycemia, with long-term current use of insulin (HCC) 10/01/2007   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 I believe he has a pending evaluation with oncology in the near future, and will defer proceeding with surgery until after this visit is completed.  At present it appears that his ascites is under good control and we discussed the risks of venous collateralization, and the risks of bleeding associated with this.  We also discussed the risks of ascitic leak showed his ascites recur.  I discussed possibility of incarceration, strangulation, enlargement in size over time, and the need for emergency surgery in the face of these.  Also reviewed the techniques of reduction should incarceration occur, and when unsuccessful to present to the ED.  Also discussed that surgery  risks include recurrence which can be up to 30% in the case of complex hernias, use of prosthetic materials (mesh) and the increased risk of infection and the possible need for re-operation and removal of mesh, possibility of post-op SBO or ileus, and the risks of general anesthetic including heart attack, stroke, sudden death or some reaction to anesthetic medications. The patient, and those present, appear to understand the risks, any and all questions were  answered to the patient's satisfaction.  No guarantees were ever expressed or implied.   Face-to-face time spent with the patient and accompanying care providers(if present) was 30 minutes, with more than 50% of the time spent counseling, educating, and coordinating care of the patient.    These notes generated with voice recognition software. I apologize for typographical errors.  Campbell Lerner M.D., FACS 03/29/2023, 2:13 PM

## 2023-03-28 NOTE — Progress Notes (Signed)
Patient ID: [name], male   DOB: 06/12/1951, 72 y.o.   MRN: [id number]  Chief Complaint: Umbilical hernia  History of Present Illness Previously scheduled for hernia repair October 31, 2022.  Canceled, due to unanticipated moderate volume of ascites. Right inguinal hernia containing fluid and fat noted on last CT, with cirrhosis.    Currently reports he is taking sufficient diuretics, and notes diminished abdominal girth.  Definitely wishes to pursue hernia repair as this is troublesome.  He has a visit with oncology next week, and may have additional diagnoses that may prioritize his care and force defer of surgery once again.  Previous visit: [name] is a 72 y.o. male with tender knot at the navel over the last 2 weeks.  Progressively more tender, often having a bulge which seems to spontaneously reduce when recumbent.  Sometimes the pain is severe is a 6-10.  Associated nausea.  Bowel movements appear to be loose and denying constipation.  Had prior laparoscopic cholecystectomy with supra umbilical incision.   Prior history of left inguinal hernia repair, x 2.  Past Medical History Past Medical History:  Diagnosis Date   Alcoholism (HCC)    Anxiety    Arthritis    Cancer (HCC)    Cirrhosis with alcoholism (HCC)    Depression    Diabetes mellitus without complication (HCC)    Dyspnea    Elevated liver enzymes    Esophageal varices (HCC)    Gastritis    GERD (gastroesophageal reflux disease)    Hyperlipidemia    Hypertension    Pancreatitis       Past Surgical History:  Procedure Laterality Date   CHOLECYSTECTOMY  2006   COLONOSCOPY WITH ESOPHAGOGASTRODUODENOSCOPY (EGD)     COLONOSCOPY WITH PROPOFOL N/A 11/21/2015   Procedure: COLONOSCOPY WITH PROPOFOL;  Surgeon: Scot Jun, MD;  Location: [facility] ENDOSCOPY;  Service: Endoscopy;  Laterality: N/A;   ESOPHAGOGASTRODUODENOSCOPY (EGD) WITH PROPOFOL N/A 08/21/2019   Procedure: ESOPHAGOGASTRODUODENOSCOPY (EGD) WITH  PROPOFOL;  Surgeon: Midge Minium, MD;  Location: [facility] ENDOSCOPY;  Service: Endoscopy;  Laterality: N/A;   HERNIA REPAIR     Inguinal Hernia Repai. Left: 07/28/2013 Dr. Egbert Garibaldi; 2nd repair done 04/30/2014   TONSILLECTOMY      Allergies  Allergen Reactions   Hydrochlorothiazide Other (See Comments)    Reaction:  Hyponatremia    Current Outpatient Medications  Medication Sig Dispense Refill   Accu-Chek Softclix Lancets lancets Use to check glucose ac and as needed 100 each 12   acetaminophen (TYLENOL) 500 MG tablet Take 1,000 mg by mouth every 6 (six) hours as needed.     allopurinol (ZYLOPRIM) 100 MG tablet TAKE 1 TABLET BY MOUTH EVERY DAY 90 tablet 4   B-D UF III MINI PEN NEEDLES 31G X 5 MM MISC USE 1 TO CHECK BLOOD SUGAR 3 TIMES DAILY 100 each 4   carvedilol (COREG) 3.125 MG tablet TAKE 1 TABLET BY MOUTH 2 TIMES DAILY. 180 tablet 0   Continuous Blood Gluc Receiver (FREESTYLE LIBRE 14 DAY READER) DEVI 1 Device by Does not apply route every 14 (fourteen) days. for type insulin dependent type 2 diabetes 1 each 12   Continuous Blood Gluc Sensor (FREESTYLE LIBRE 14 DAY SENSOR) MISC Place 1 Device onto the skin every 14 (fourteen) days. for insulin dependent type 2 diabetes 2 each 12   fluticasone (CUTIVATE) 0.05 % cream Apply topically.     furosemide (LASIX) 20 MG tablet TAKE 3 TABLETS BY MOUTH EVERY DAY 270 tablet  0   insulin degludec (TRESIBA FLEXTOUCH) 100 UNIT/ML FlexTouch Pen INJECT UP TO 20 UNITS UNDER THE SKIN EVERY DAY AT BEDTIME. NEED APPT FOR REFILLS. 9 mL 1   magnesium gluconate (MAGONATE) 500 MG tablet Take 500 mg by mouth daily.      Multiple Vitamin (MULTIVITAMIN WITH MINERALS) TABS tablet Take 1 tablet by mouth daily.     naltrexone (DEPADE) 50 MG tablet TAKE 1 TABLET BY MOUTH EVERY DAY 90 tablet 1   niacin 500 MG tablet Take 500 mg by mouth at bedtime.     NOVOLOG FLEXPEN 100 UNIT/ML FlexPen INJECT 20 UNITS UP TO THREE TIMES A DAY BEFORE MEALS. 45 mL 0   omeprazole (PRILOSEC) 40  MG capsule TAKE 1 CAPSULE BY MOUTH EVERY DAY 90 capsule 1   ONETOUCH VERIO test strip USE TO CHECK SUGAR THREE TIMES DAILY FOR INSULIN DEPENDENT TYPE 2 DIABETES 100 strip 4   PRILOSEC OTC 20 MG tablet 40 mg daily.     promethazine (PHENERGAN) 25 MG tablet TAKE 1 TABLET BY MOUTH EVERY 8 HOURS AS NEEDED FOR NAUSEA OR VOMITING. 20 tablet 3   pyridOXINE (VITAMIN B-6) 50 MG tablet Take 50 mg by mouth daily.     sertraline (ZOLOFT) 100 MG tablet TAKE 1 TABLET BY MOUTH EVERY DAY 90 tablet 4   silver sulfADIAZINE (SILVADENE) 1 % cream Apply 1 application topically daily. 50 g 0   spironolactone (ALDACTONE) 100 MG tablet Take 1 tablet (100 mg total) by mouth daily. 90 tablet 0   traMADol (ULTRAM) 50 MG tablet TAKE 1 TABLET BY MOUTH EVERY 8 HOURS AS NEEDED. 15 tablet 2   zinc gluconate 50 MG tablet Take 50 mg by mouth daily.     No current facility-administered medications for this visit.    Family History Family History  Problem Relation Age of Onset   Colon polyps Father    Lung cancer Father    Hypertension Father    Dementia Mother    Hypertension Brother    Heart Problems Brother    Hypertension Brother    Diabetes Brother        type 2   Congestive Heart Failure Brother       Social History Social History   Tobacco Use   Smoking status: Never   Smokeless tobacco: Never  Vaping Use   Vaping status: Never Used  Substance Use Topics   Alcohol use: Not Currently    Comment: previous heavy drinker   Drug use: No        Review of Systems  Constitutional: Negative.   HENT: Negative.    Eyes: Negative.   Respiratory: Negative.    Cardiovascular: Negative.   Gastrointestinal:  Positive for abdominal pain, diarrhea, heartburn, nausea and vomiting. Negative for blood in stool, constipation and melena.  Genitourinary: Negative.   Skin: Negative.   Neurological: Negative.   Psychiatric/Behavioral: Negative.       Physical Exam Blood pressure 107/68, pulse 66, temperature  98.5 F (36.9 C), temperature source Oral, height 5\' 5"  (1.651 m), weight 140 lb (63.5 kg), SpO2 100%. Last Weight  Most recent update: 03/28/2023  3:19 PM    Weight  63.5 kg (140 lb)              CONSTITUTIONAL: Well developed, and nourished, appropriately responsive and aware without distress.   EYES: Sclera non-icteric.   EARS, NOSE, MOUTH AND THROAT:  The oropharynx is clear. Oral mucosa is pink and moist.  Hearing is intact to voice.  NECK: Trachea is midline, and there is no jugular venous distension.  LYMPH NODES:  Lymph nodes in the neck are not appreciated. RESPIRATORY:  Lungs are clear, and breath sounds are equal bilaterally.  Normal respiratory effort without pathologic use of accessory muscles. CARDIOVASCULAR: Heart is regular in rate and rhythm.   Well perfused.  GI: The abdomen is notable for a small tender bulge at the cephalad aspect of the umbilicus under an adjacent scar, otherwise soft, nontender, and nondistended. There were no palpable masses.  I did not appreciate hepatosplenomegaly.  No appreciable ascites on clinical exam today. MUSCULOSKELETAL:  Symmetrical muscle tone appreciated in all four extremities.    SKIN: Skin turgor is normal. No pathologic skin lesions appreciated.  NEUROLOGIC:  Motor and sensation appear grossly normal.  Cranial nerves are grossly without defect. PSYCH:  Alert and oriented to person, place and time. Affect is appropriate for situation.  Data Reviewed I have personally reviewed what is currently available of the patient's imaging, recent labs and medical records.   Labs:     Latest Ref Rng & Units 03/13/2023    3:37 PM 10/29/2022   11:11 AM 03/28/2022   10:16 AM  CBC  WBC 3.4 - 10.8 x10E3/uL 6.4  6.6  7.2   Hemoglobin 13.0 - 17.7 g/dL 62.9  52.8  41.3   Hematocrit 37.5 - 51.0 % 34.6  43.5  40.6   Platelets 150 - 450 x10E3/uL 226  193  178       Latest Ref Rng & Units 03/13/2023    3:37 PM 02/25/2023    1:00 PM 12/14/2022   11:31  AM  CMP  Glucose 70 - 99 mg/dL 244  010  272   BUN 8 - 27 mg/dL 7  8  7    Creatinine 0.76 - 1.27 mg/dL 5.36  6.44  0.34   Sodium 134 - 144 mmol/L 129  133  135   Potassium 3.5 - 5.2 mmol/L 5.4  4.3  5.1   Chloride 96 - 106 mmol/L 93  96  95   CO2 20 - 29 mmol/L 26  26  27    Calcium 8.6 - 10.2 mg/dL 8.9  8.5  8.6   Total Protein 6.0 - 8.5 g/dL 7.0  6.2    Total Bilirubin 0.0 - 1.2 mg/dL 1.5  1.5    Alkaline Phos 44 - 121 IU/L 222  214    AST 0 - 40 IU/L 25  26    ALT 0 - 44 IU/L 12  13        Imaging: Radiological images reviewed:  CLINICAL DATA:  Cirrhosis periumbilical swelling nausea   EXAM: CT ABDOMEN AND PELVIS WITH CONTRAST   TECHNIQUE: Multidetector CT imaging of the abdomen and pelvis was performed using the standard protocol following bolus administration of intravenous contrast.   RADIATION DOSE REDUCTION: This exam was performed according to the departmental dose-optimization program which includes automated exposure control, adjustment of the mA and/or kV according to patient size and/or use of iterative reconstruction technique.   CONTRAST:  85mL OMNIPAQUE IOHEXOL 300 MG/ML  SOLN   COMPARISON:  Ultrasound 02/21/2021, CT 12/08/2014   FINDINGS: Lower chest: Lung bases demonstrate linear atelectasis or scarring at the bases. No acute airspace disease. Ascending aortic diameter up to 4 cm. Gynecomastia. Coronary vascular calcification.   Hepatobiliary: Liver cirrhosis. Status post cholecystectomy. No biliary dilatation   Pancreas: Atrophic. No pancreatic ductal dilatation or surrounding inflammatory  changes.   Spleen: Enlarged measuring 15.4 cm   Adrenals/Urinary Tract: Adrenal glands are normal. Kidneys show no hydronephrosis. The bladder is unremarkable.   Stomach/Bowel: The stomach is nonenlarged. No dilated small bowel. Colon wall thickening versus under distended hepatic flexure and transverse colon. Negative appendix.   Vascular/Lymphatic:  Moderate aortic atherosclerosis. No aneurysm. Subcentimeter retroperitoneal lymph nodes. Subcentimeter peripancreatic nodes. 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   Reproductive: Prostate slightly enlarged   Other: No free air. Moderate volume of ascites. Right inguinal hernia containing fluid and fat   Musculoskeletal: No acute osseous abnormality. Grade 1 anterolisthesis L4 on L5 with advanced degenerative changes   IMPRESSION: 1. Liver cirrhosis with splenomegaly and moderate volume of ascites. 2. 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 Colon wall thickening versus under distension of the hepatic flexure and transverse colon. 4. Aortic atherosclerosis.   Aortic Atherosclerosis (ICD10-I70.0).     Electronically Signed   By: Jasmine Pang M.D.   On: 11/03/2022 03:23 Within last 24 hrs: No results found.  Assessment    Incisional hernia, reducible.  Estimated defect size with umbilical hernia defect approximately 3 cm or more.  Ascites seems to be controlled. Patient Active Problem List   Diagnosis Date Noted   Ventral hernia without obstruction or gangrene 03/13/2023   Incisional hernia, without obstruction or gangrene 10/23/2022   Alcohol dependence in remission (HCC) 08/15/2021   Closed fracture of heel bone 05/11/2021   Primary localized osteoarthrosis of ankle and foot 05/11/2021   Ankle pain 05/11/2021   Sprain of right ankle 05/11/2021   Gynecomastia, male 02/19/2020   Gastric varices    Hyperuricemia 10/02/2016   Alcohol abuse 03/30/2015   Dermatitis, eczematoid 03/20/2015   GERD (gastroesophageal reflux disease) 03/20/2015   History of Helicobacter pylori infection 03/20/2015   Hearing loss of right ear 03/20/2015   Hernia, inguinal, left 03/20/2015   Supraspinatus sprain and strain 03/20/2015   Cirrhosis (HCC) 03/20/2015   Scalp laceration 02/28/2015   Rib contusion 02/28/2015    Splenic vein thrombosis    History of portal hypertension    Blood clotting disorder (HCC) 02/11/2014   Odynophagia 02/10/2014   Abnormal liver enzymes 12/08/2013   Esophagitis 12/08/2013   Gastric catarrh 12/08/2013   H/O adenomatous polyp of colon 09/23/2012   Esophageal varices (HCC) 09/09/2012   DDD (degenerative disc disease), cervical 08/17/2011   Vitamin D deficiency 08/19/2009   Type 2 diabetes mellitus with hyperglycemia, with long-term current use of insulin (HCC) 10/01/2007   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 I believe he has a pending evaluation with oncology in the near future, and will defer proceeding with surgery until after this visit is completed.  At present it appears that his ascites is under good control and we discussed the risks of venous collateralization, and the risks of bleeding associated with this.  We also discussed the risks of ascitic leak showed his ascites recur.  I discussed possibility of incarceration, strangulation, enlargement in size over time, and the need for emergency surgery in the face of these.  Also reviewed the techniques of reduction should incarceration occur, and when unsuccessful to present to the ED.  Also discussed that surgery  risks include recurrence which can be up to 30% in the case of complex hernias, use of prosthetic materials (mesh) and the increased risk of infection and the possible need for re-operation and removal of mesh, possibility of post-op SBO or ileus, and the risks of general anesthetic including heart attack, stroke, sudden death or some reaction to anesthetic medications. The patient, and those present, appear to understand the risks, any and all questions were  answered to the patient's satisfaction.  No guarantees were ever expressed or implied.   Face-to-face time spent with the patient and accompanying care providers(if present) was 30 minutes, with more than 50% of the time spent counseling, educating, and coordinating care of the patient.    These notes generated with voice recognition software. I apologize for typographical errors.  Campbell Lerner M.D., FACS 03/29/2023, 2:13 PM

## 2023-03-28 NOTE — Patient Instructions (Addendum)
You have requested for your Umbilical Hernia be repaired. This will be scheduled with Dr. Claudine Mouton at [facility].   Please see your (blue)pre-care sheet for information. Our surgery scheduler will call you to verify surgery date and to go over information.   You will need to arrange to be off work for 1-2 weeks but will have to have a lifting restriction of no more than 15 lbs for 6 weeks following your surgery. If you have FMLA or disability paperwork that needs filled out you may drop this off at our office or this can be faxed to [phone].     Umbilical Hernia, Adult A hernia is a bulge of tissue that pushes through an opening between muscles. An umbilical hernia happens in the abdomen, near the belly button (umbilicus). The hernia may contain tissues from the small intestine, large intestine, or fatty tissue covering the intestines (omentum). Umbilical hernias in adults tend to get worse over time, and they require surgical treatment. There are several types of umbilical hernias. You may have: A hernia located just above or below the umbilicus (indirect hernia). This is the most common type of umbilical hernia in adults. A hernia that forms through an opening formed by the umbilicus (direct hernia). A hernia that comes and goes (reducible hernia). A reducible hernia may be visible only when you strain, lift something heavy, or cough. This type of hernia can be pushed back into the abdomen (reduced). A hernia that traps abdominal tissue inside the hernia (incarcerated hernia). This type of hernia cannot be reduced. A hernia that cuts off blood flow to the tissues inside the hernia (strangulated hernia). The tissues can start to die if this happens. This type of hernia requires emergency treatment.  What are the causes? An umbilical hernia happens when tissue inside the abdomen presses on a weak area of the abdominal muscles. What increases the risk? You may have  a greater risk of this condition if you: Are obese. Have had several pregnancies. Have a buildup of fluid inside your abdomen (ascites). Have had surgery that weakens the abdominal muscles.  What are the signs or symptoms? The main symptom of this condition is a painless bulge at or near the belly button. A reducible hernia may be visible only when you strain, lift something heavy, or cough. Other symptoms may include: Dull pain. A feeling of pressure.  Symptoms of a strangulated hernia may include: Pain that gets increasingly worse. Nausea and vomiting. Pain when pressing on the hernia. Skin over the hernia becoming red or purple. Constipation. Blood in the stool.  How is this diagnosed? This condition may be diagnosed based on: A physical exam. You may be asked to cough or strain while standing. These actions increase the pressure inside your abdomen and force the hernia through the opening in your muscles. Your health care provider may try to reduce the hernia by pressing on it. Your symptoms and medical history.  How is this treated? Surgery is the only treatment for an umbilical hernia. Surgery for a strangulated hernia is done as soon as possible. If you have a small hernia that is not incarcerated, you may need to lose weight before having surgery. Follow these instructions at home: Lose weight, if told by your health care provider. Do not try to push the hernia back in. Watch your hernia for any changes in color or size. Tell your health care provider if any changes occur. You may need to avoid activities  that increase pressure on your hernia. Do not lift anything that is heavier than 10 lb (4.5 kg) until your health care provider says that this is safe. Take over-the-counter and prescription medicines only as told by your health care provider. Keep all follow-up visits as told by your health care provider. This is important. Contact a health care provider if: Your hernia  gets larger. Your hernia becomes painful. Get help right away if: You develop sudden, severe pain near the area of your hernia. You have pain as well as nausea or vomiting. You have pain and the skin over your hernia changes color. You develop a fever. This information is not intended to replace advice given to you by your health care provider. Make sure you discuss any questions you have with your health care provider. Document Released: 11/25/2015 Document Revised: 02/26/2016 Document Reviewed: 11/25/2015 Elsevier Interactive Patient Education  [employer].

## 2023-03-29 ENCOUNTER — Telehealth: Payer: Self-pay | Admitting: Surgery

## 2023-03-29 NOTE — Telephone Encounter (Signed)
Patient has been advised of Pre-Admission date/time, and Surgery date at [facility].  Surgery Date: 04/10/23 Preadmission Testing Date: 04/03/23 (phone 1p-4p)  Patient has been made aware to call [phone], between 1-3:00pm the day before surgery, to find out what time to arrive for surgery.

## 2023-04-02 ENCOUNTER — Ambulatory Visit: Payer: Self-pay | Admitting: Surgery

## 2023-04-02 ENCOUNTER — Ambulatory Visit: Payer: PPO | Admitting: Gastroenterology

## 2023-04-02 ENCOUNTER — Telehealth: Payer: Self-pay | Admitting: Pharmacist

## 2023-04-02 NOTE — Progress Notes (Signed)
04/02/2023  Patient ID: [name], male   DOB: May 30, 1951, 72 y.o.   MRN: [id number]  Pharmacy Quality Measure Review  This patient is appearing on a report for being at risk of failing the adherence measure for hypertension (ACEi/ARB) medications this calendar year.   Medication: Lisinopril 10mg  Last fill date: 07/20/22 for 90 day supply  However received Lisinopril 5mg  on 10/17/22 and 02/15/23 for 90DS. NO longer taking Lisinopril due to starting high dose of Spironolactone with potassium concerns!  Insurance report was not up to date. No action needed at this time. Should pass metric however despite discontinuation.    Marlowe Aschoff, PharmD [facility] Phone Number: [phone]

## 2023-04-03 ENCOUNTER — Encounter: Payer: Self-pay | Admitting: Oncology

## 2023-04-03 ENCOUNTER — Other Ambulatory Visit: Payer: Self-pay | Admitting: Gastroenterology

## 2023-04-03 ENCOUNTER — Inpatient Hospital Stay: Payer: PPO | Admitting: Oncology

## 2023-04-03 ENCOUNTER — Ambulatory Visit: Payer: Self-pay | Admitting: Surgery

## 2023-04-03 ENCOUNTER — Other Ambulatory Visit: Payer: Self-pay | Admitting: Family Medicine

## 2023-04-03 ENCOUNTER — Inpatient Hospital Stay: Admission: RE | Admit: 2023-04-03 | Payer: PPO | Source: Ambulatory Visit

## 2023-04-03 VITALS — BP 107/74 | HR 61 | Temp 97.4°F | Resp 18 | Ht 65.0 in | Wt 146.4 lb

## 2023-04-03 DIAGNOSIS — D509 Iron deficiency anemia, unspecified: Secondary | ICD-10-CM | POA: Diagnosis not present

## 2023-04-03 DIAGNOSIS — K432 Incisional hernia without obstruction or gangrene: Secondary | ICD-10-CM

## 2023-04-03 DIAGNOSIS — D649 Anemia, unspecified: Secondary | ICD-10-CM

## 2023-04-03 DIAGNOSIS — K219 Gastro-esophageal reflux disease without esophagitis: Secondary | ICD-10-CM

## 2023-04-03 HISTORY — DX: Vitamin D deficiency, unspecified: E55.9

## 2023-04-03 HISTORY — DX: Unspecified hearing loss, right ear: H91.91

## 2023-04-03 HISTORY — DX: Ventral hernia without obstruction or gangrene: K43.9

## 2023-04-03 HISTORY — DX: Other specified bacterial intestinal infections: A04.8

## 2023-04-03 HISTORY — DX: Gastric varices: I86.4

## 2023-04-03 HISTORY — DX: Esophagitis, unspecified without bleeding: K20.90

## 2023-04-03 HISTORY — DX: Personal history of other diseases of the digestive system: Z87.19

## 2023-04-03 HISTORY — DX: Iron deficiency anemia, unspecified: D50.9

## 2023-04-03 HISTORY — DX: Obesity, unspecified: E66.9

## 2023-04-03 HISTORY — DX: Lipodystrophy, not elsewhere classified: E88.1

## 2023-04-03 HISTORY — DX: Polyp of colon: K63.5

## 2023-04-03 HISTORY — DX: Alcohol abuse, uncomplicated: F10.10

## 2023-04-03 HISTORY — DX: Male erectile dysfunction, unspecified: N52.9

## 2023-04-03 HISTORY — DX: Type 2 diabetes mellitus without complications: E11.9

## 2023-04-03 HISTORY — DX: Unilateral inguinal hernia, without obstruction or gangrene, not specified as recurrent: K40.90

## 2023-04-03 HISTORY — DX: Atherosclerosis of aorta: I70.0

## 2023-04-03 HISTORY — DX: Gout, unspecified: M10.9

## 2023-04-03 HISTORY — DX: Dysphagia, unspecified: R13.10

## 2023-04-03 HISTORY — DX: Other ascites: R18.8

## 2023-04-03 HISTORY — DX: Splenomegaly, not elsewhere classified: R16.1

## 2023-04-03 HISTORY — DX: Personal history of other specified conditions: Z87.898

## 2023-04-03 HISTORY — DX: Acute embolism and thrombosis of other specified veins: I82.890

## 2023-04-03 HISTORY — DX: Other cervical disc degeneration, unspecified cervical region: M50.30

## 2023-04-03 HISTORY — DX: Gastritis, unspecified, without bleeding: K29.70

## 2023-04-03 NOTE — Pre-Procedure Instructions (Signed)
Called pt to do anesthesia interview. Wife states pt is not home and has 2 appointments this afternoon and wont be back until later this evening. Pts wife asked if this phone call could be moved to tomorrow morning. She requested the phone call somewhere between 10-11 am as pt would be home at this time. I moved pts appt to 04-04-23

## 2023-04-04 ENCOUNTER — Encounter
Admission: RE | Admit: 2023-04-04 | Discharge: 2023-04-04 | Disposition: A | Discharge status: Home / Self Care | Payer: PPO | Source: Ambulatory Visit | Attending: Surgery | Admitting: Surgery

## 2023-04-04 DIAGNOSIS — Z01818 Encounter for other preprocedural examination: Secondary | ICD-10-CM

## 2023-04-04 HISTORY — DX: Hyperkalemia: E87.5

## 2023-04-04 HISTORY — DX: Hypo-osmolality and hyponatremia: E87.1

## 2023-04-04 HISTORY — DX: Unspecified convulsions: R56.9

## 2023-04-04 HISTORY — DX: Personal history of urinary calculi: Z87.442

## 2023-04-04 HISTORY — DX: Reserved for inherently not codable concepts without codable children: IMO0001

## 2023-04-04 HISTORY — DX: COVID-19: U07.1

## 2023-04-04 NOTE — Patient Instructions (Signed)
Your procedure is scheduled on:04-10-23 Wednesday Report to the Registration Desk on the 1st floor of the [facility].Then proceed to the 2nd floor Surgery Desk To find out your arrival time, please call [phone] between 1PM - 3PM on:04-09-23 Tuesday If your arrival time is 6:00 am, do not arrive before that time as the [facility] entrance doors do not open until 6:00 am.  REMEMBER: Instructions that are not followed completely may result in serious medical risk, up to and including death; or upon the discretion of your surgeon and anesthesiologist your surgery may need to be rescheduled.  Do not eat food OR drink any liquids after midnight the night before surgery.  No gum chewing or hard candies.  One week prior to surgery: Stop Anti-inflammatories (NSAIDS) such as Advil, Aleve, Ibuprofen, Motrin, Naproxen, Naprosyn and Aspirin based products such as Excedrin, Goody's Powder, BC Powder.You may however, take Tylenol/Tramadol if needed for pain up until the day of surgery. Stop ANY OVER THE COUNTER supplements/vitamins NOW (04-04-23) until after surgery (Magnesium, multivitamin, niacin, vitamin B6, zinc)   Continue taking all prescribed medications  TAKE ONLY THESE MEDICATIONS THE MORNING OF SURGERY WITH A SIP OF WATER: -allopurinol (ZYLOPRIM)  -carvedilol (COREG)  -sertraline (ZOLOFT)  -omeprazole (PRILOSEC)-take one the night before and one on the morning of surgery - helps to prevent nausea after surgery.)  Take half of your Tresiba Insulin the night prior to surgery-NO Insulin the morning of surgery  No Alcohol for 24 hours before or after surgery.  No Smoking including e-cigarettes for 24 hours before surgery.  No chewable tobacco products for at least 6 hours before surgery.  No nicotine patches on the day of surgery.  Do not use any "recreational" drugs for at least a week (preferably 2 weeks) before your surgery.  Please be advised that the combination of cocaine and  anesthesia may have negative outcomes, up to and including death. If you test positive for cocaine, your surgery will be cancelled.  On the morning of surgery brush your teeth with toothpaste and water, you may rinse your mouth with mouthwash if you wish. Do not swallow any toothpaste or mouthwash.  Use CHG Soap as directed on instruction sheet.  Do not wear jewelry, make-up, hairpins, clips or nail polish.  For welded (permanent) jewelry: bracelets, anklets, waist bands, etc.  Please have this removed prior to surgery.  If it is not removed, there is a chance that hospital personnel will need to cut it off on the day of surgery.  Do not wear lotions, powders, or perfumes.   Do not shave body hair from the neck down 48 hours before surgery.  Contact lenses, hearing aids and dentures may not be worn into surgery.  Do not bring valuables to the hospital. [facility] is not responsible for any missing/lost belongings or valuables.   Notify your doctor if there is any change in your medical condition (cold, fever, infection).  Wear comfortable clothing (specific to your surgery type) to the hospital.  After surgery, you can help prevent lung complications by doing breathing exercises.  Take deep breaths and cough every 1-2 hours. Your doctor may order a device called an Incentive Spirometer to help you take deep breaths. When coughing or sneezing, hold a pillow firmly against your incision with both hands. This is called "splinting." Doing this helps protect your incision. It also decreases belly discomfort.  If you are being admitted to the hospital overnight, leave your suitcase in the car.  After surgery it may be brought to your room.  In case of increased patient census, it may be necessary for you, the patient, to continue your postoperative care in the Same Day Surgery department.  If you are being discharged the day of surgery, you will not be allowed to drive home. You will need  a responsible individual to drive you home and stay with you for 24 hours after surgery.   If you are taking public transportation, you will need to have a responsible individual with you.  Please call the Pre-admissions Testing Dept. at [phone] if you have any questions about these instructions.  Surgery Visitation Policy:  Patients having surgery or a procedure may have two visitors.  Children under the age of 41 must have an adult with them who is not the patient.     Preparing for Surgery with CHLORHEXIDINE GLUCONATE (CHG) Soap  Chlorhexidine Gluconate (CHG) Soap  o An antiseptic cleaner that kills germs and bonds with the skin to continue killing germs even after washing  o Used for showering the night before surgery and morning of surgery  Before surgery, you can play an important role by reducing the number of germs on your skin.  CHG (Chlorhexidine gluconate) soap is an antiseptic cleanser which kills germs and bonds with the skin to continue killing germs even after washing.  Please do not use if you have an allergy to CHG or antibacterial soaps. If your skin becomes reddened/irritated stop using the CHG.  1. Shower the NIGHT BEFORE SURGERY and the MORNING OF SURGERY with CHG soap.  2. If you choose to wash your hair, wash your hair first as usual with your normal shampoo.  3. After shampooing, rinse your hair and body thoroughly to remove the shampoo.  4. Use CHG as you would any other liquid soap. You can apply CHG directly to the skin and wash gently with a scrungie or a clean washcloth.  5. Apply the CHG soap to your body only from the neck down. Do not use on open wounds or open sores. Avoid contact with your eyes, ears, mouth, and genitals (private parts). Wash face and genitals (private parts) with your normal soap.  6. Wash thoroughly, paying special attention to the area where your surgery will be performed.  7. Thoroughly rinse your body with warm  water.  8. Do not shower/wash with your normal soap after using and rinsing off the CHG soap.  9. Pat yourself dry with a clean towel.  10. Wear clean pajamas to bed the night before surgery.  12. Place clean sheets on your bed the night of your first shower and do not sleep with pets.  13. Shower again with the CHG soap on the day of surgery prior to arriving at the hospital.  14. Do not apply any deodorants/lotions/powders.  15. Please wear clean clothes to the hospital.

## 2023-04-05 ENCOUNTER — Encounter
Admission: RE | Admit: 2023-04-05 | Discharge: 2023-04-05 | Disposition: A | Discharge status: Home / Self Care | Payer: PPO | Source: Ambulatory Visit | Attending: Surgery | Admitting: Surgery

## 2023-04-05 DIAGNOSIS — Z79899 Other long term (current) drug therapy: Secondary | ICD-10-CM

## 2023-04-05 DIAGNOSIS — Z01812 Encounter for preprocedural laboratory examination: Secondary | ICD-10-CM | POA: Diagnosis not present

## 2023-04-05 DIAGNOSIS — Z01818 Encounter for other preprocedural examination: Secondary | ICD-10-CM | POA: Diagnosis present

## 2023-04-05 DIAGNOSIS — I8510 Secondary esophageal varices without bleeding: Secondary | ICD-10-CM | POA: Diagnosis not present

## 2023-04-05 DIAGNOSIS — K432 Incisional hernia without obstruction or gangrene: Secondary | ICD-10-CM

## 2023-04-05 DIAGNOSIS — T502X5A Adverse effect of carbonic-anhydrase inhibitors, benzothiadiazides and other diuretics, initial encounter: Secondary | ICD-10-CM | POA: Diagnosis not present

## 2023-04-05 DIAGNOSIS — K7460 Unspecified cirrhosis of liver: Secondary | ICD-10-CM | POA: Insufficient documentation

## 2023-04-05 DIAGNOSIS — I864 Gastric varices: Secondary | ICD-10-CM | POA: Diagnosis not present

## 2023-04-05 DIAGNOSIS — E876 Hypokalemia: Secondary | ICD-10-CM

## 2023-04-05 LAB — COMPREHENSIVE METABOLIC PANEL
ALT: 17 U/L (ref 0–44)
AST: 26 U/L (ref 15–41)
Albumin: 3 g/dL — ABNORMAL LOW (ref 3.5–5.0)
Alkaline Phosphatase: 177 U/L — ABNORMAL HIGH (ref 38–126)
Anion gap: 8 (ref 5–15)
BUN: 11 mg/dL (ref 8–23)
CO2: 30 mmol/L (ref 22–32)
Calcium: 8.5 mg/dL — ABNORMAL LOW (ref 8.9–10.3)
Chloride: 99 mmol/L (ref 98–111)
Creatinine, Ser: 0.47 mg/dL — ABNORMAL LOW (ref 0.61–1.24)
GFR, Estimated: >60 mL/min (ref >60)
Glucose, Bld: 119 mg/dL — ABNORMAL HIGH (ref 70–99)
Potassium: 3 mmol/L — ABNORMAL LOW (ref 3.5–5.1)
Sodium: 137 mmol/L (ref 135–145)
Total Bilirubin: 1.8 mg/dL — ABNORMAL HIGH (ref 0.3–1.2)
Total Protein: 6.5 g/dL (ref 6.5–8.1)

## 2023-04-05 LAB — CBC WITH DIFFERENTIAL/PLATELET
Abs Immature Granulocytes: 0.01 10*3/uL (ref 0.00–0.07)
Basophils Absolute: 0 10*3/uL (ref 0.0–0.1)
Basophils Relative: 1 %
Eosinophils Absolute: 0.3 10*3/uL (ref 0.0–0.5)
Eosinophils Relative: 5 %
HCT: 35.1 % — ABNORMAL LOW (ref 39.0–52.0)
Hemoglobin: 12 g/dL — ABNORMAL LOW (ref 13.0–17.0)
Immature Granulocytes: 0 %
Lymphocytes Relative: 11 %
Lymphs Abs: 0.7 10*3/uL (ref 0.7–4.0)
MCH: 31.6 pg (ref 26.0–34.0)
MCHC: 34.2 g/dL (ref 30.0–36.0)
MCV: 92.4 fL (ref 80.0–100.0)
Monocytes Absolute: 0.6 10*3/uL (ref 0.1–1.0)
Monocytes Relative: 10 %
Neutro Abs: 4.3 10*3/uL (ref 1.7–7.7)
Neutrophils Relative %: 73 %
Platelets: 166 10*3/uL (ref 150–400)
RBC: 3.8 MIL/uL — ABNORMAL LOW (ref 4.22–5.81)
RDW: 14.9 % (ref 11.5–15.5)
WBC: 6 10*3/uL (ref 4.0–10.5)
nRBC: 0 % (ref 0.0–0.2)

## 2023-04-05 LAB — PROTIME-INR
INR: 1.4 — ABNORMAL HIGH (ref 0.8–1.2)
Prothrombin Time: 17.1 s — ABNORMAL HIGH (ref 11.4–15.2)

## 2023-04-05 MED ORDER — POTASSIUM CHLORIDE CRYS ER 20 MEQ PO TBCR: EXTENDED_RELEASE_TABLET | ORAL | 0 refills | Status: DC

## 2023-04-05 NOTE — Progress Notes (Unsigned)
[facility] Perioperative Services: Pre-Admission/Anesthesia Testing  Abnormal Lab Notification and Treatment Plan of Care   Date: 04/05/23  Name: [name] MRN:   [id number]  Re: Abnormal labs noted during PAT appointment   Notified:  Provider Name Provider Role Notification Mode  Campbell Lerner, MD General Surgery (Surgeon) Routed and/or faxed via Vonzell Schlatter, MD Primary Care Provider Routed and/or faxed via [facility]   Clinical Information and Notes:  ABNORMAL LAB VALUE(S): Lab Results  Component Value Date   K 3.0 (L) 04/05/2023   [name] is scheduled for an elective XI ROBOTIC ASSISTED VENTRAL HERNIA on 04/09/2023. In review of his medication reconciliation, it is noted that the patient is taking prescribed diuretic medications (spironolactone + furosemide) daily.   Please note, in efforts to promote a safe and effective anesthetic course, per current guidelines/standards set by the [facility] anesthesia team, the minimal acceptable K+ level for the patient to proceed with general anesthesia is 3.0 mmol/L. With that being said, if the patient drops any lower, his elective procedure will need to be postponed until K+ is better optimized. In efforts to prevent case cancellation, will make efforts to optimize pre-surgical K+ level so that patient can safely undergo the planned surgical intervention.   Impression and Plan:  [name] found to be HYPOkalemic at 3.0 mmol/L on preoperative labs.   Communicated with patient to discuss results and plans for correction of noted electrolyte derangement.  He is on daily K+ sparing diuretic with additional loop therapy PRN.  Discussed diuretic therapy as likely etiology in the absence of GI related symptoms (no diarrhea). Patient denies regular use of laxative medications. Reviewed other potential causes, including decreased intake of dietary K+ and warmer weather resulting in increased insensible losses.  Reviewed plans for preoperative optimization as follows:   Meds ordered this encounter  Medications   potassium chloride SA (KLOR-CON M) 20 MEQ tablet    Sig: Take 2 tablets (40 mEq) today, then take 1 tablet (20 mEq) daily until surgery. Be sure to take dose on day of procedure. Follow up with PCP for repeat labs.    Dispense:  6 tablet    Refill:  0    Please contact patient once Rx is filled and ready. This is for preoperative optimization and needs to be started ASAP.   Encouraged patient to follow up with PCP about 2-3 weeks postoperatively to have labs rechecked to ensure that levels are remaining within normal range. Discussed nutritional intake of K+ rich foods as an adjunctive way to keep his K+ levels normal; list of K+ rich foods provided. Also mentioned ORS, however advised him not to rely solely on these drinks, as they are high in Na+, and he has a HTN diagnosis.   Will send copy of this note to surgeon and PCP to make them aware of K+ level and plans for correction. Discussed that PCP may elect to pursue a change in diuretic therapy or to add a daily K+ supplement if levels remain low on recheck. Order entered to recheck K+ on the day of his surgery to ensure optimization. Wished patient the best of luck with his upcoming surgery and subsequent recovery. He was encouraged to return call to the PAT clinic, or to his surgeon's office, should any questions or concerns arise between now and the time of his surgery. Patient was appreciative of the care/concern expressed by PAT staff.   Encounter Diagnoses  Name Primary?  Pre-operative laboratory examination Yes   Diuretic-induced hypokalemia    Long term current use of diuretic    On potassium sparing diuretic therapy    Quentin Mulling, MSN, APRN, FNP-C, CEN [facility]  Perioperative Services Nurse Practitioner Phone: [phone] 04/05/23 1:29 PM  NOTE: This note has been prepared using Dragon dictation  software. Despite my best ability to proofread, there is always the potential that unintentional transcriptional errors may still occur from this process.

## 2023-04-06 NOTE — Progress Notes (Unsigned)
Hematology/Oncology Consult note [facility]  Telephone:([phone] [phone]  Patient Care Team: Malva Limes, MD as PCP - General (Family Medicine) Midge Minium, MD as Consulting Physician (Gastroenterology) Cherre Huger, DC as Referring Physician (Chiropractic Medicine) Pcp, No Pa, [facility])   Name of the patient: [name]  [id number]  1950/12/23   Date of visit: 04/06/23  Diagnosis- anemia  Chief complaint/ Reason for visit- discuss results of bloodwork  Heme/Onc history:  Patient is a 71 year old male with a past medical history significant for multiple medical issues including hypertension depression, cirrhosis of the liver referred for iron deficiency anemia.  CBC from 03/13/2023 showed an H&H of 11.7/34.6 with an MCV of 93.  White count and platelets were normal.  B12 levels were elevated at 1311.  Folate normal at 12.8.  No recent iron studies have been checked.   Results of blood work from 03/19/2023 were as follows: CBC showed H&H of 11.7/34.6.  Both kappa and lambda free light chains were elevated with a normal free light chain ratio 1.33.  Myeloma panel did not show any evidence of M protein on SPEP but a small amount of IgG lambda protein was detected on immunofixation.  Haptoglobin normal.  Reticulocyte count was mildly low for the degree of anemia.  Iron studies were normal including an iron saturation of 33% and a ferritin level of 63.  LDH normal at 119.   Interval history-no acute issues since his last visit.  Overall he is doing well.  Denies any blood loss in his stool or urine  ECOG PS- 1 Pain scale- 0   Review of systems- Review of Systems  Constitutional:  Positive for malaise/fatigue. Negative for chills and fever.  HENT:  Negative for congestion, ear discharge and nosebleeds.   Eyes:  Negative for blurred vision.  Respiratory:  Negative for cough, hemoptysis, sputum production, shortness of  breath and wheezing.   Cardiovascular:  Negative for chest pain, palpitations, orthopnea and claudication.  Gastrointestinal:  Negative for abdominal pain, blood in stool, constipation, diarrhea, heartburn, melena, nausea and vomiting.  Genitourinary:  Negative for dysuria, flank pain, frequency, hematuria and urgency.  Musculoskeletal:  Negative for back pain, joint pain and myalgias.  Skin:  Negative for rash.  Neurological:  Negative for dizziness, tingling, focal weakness, seizures, weakness and headaches.  Endo/Heme/Allergies:  Does not bruise/bleed easily.  Psychiatric/Behavioral:  Negative for depression and suicidal ideas. The patient does not have insomnia.       Allergies  Allergen Reactions   Hydrochlorothiazide Other (See Comments)    Reaction:  Hyponatremia     Past Medical History:  Diagnosis Date   Alcohol abuse    Alcoholism (HCC)    Anxiety    Aortic atherosclerosis (HCC)    Arthritis    Ascites    Cancer (HCC)    Cirrhosis with alcoholism (HCC)    Colon polyp    COVID-19    DDD (degenerative disc disease), cervical    Depression    DM (diabetes mellitus), type 2 (HCC)    Dyspnea    ED (erectile dysfunction)    Elevated liver enzymes    Esophageal varices (HCC)    Esophagitis    Gastric catarrh    Gastric varices    Gastritis    GERD (gastroesophageal reflux disease)    Gout    H. pylori infection    H/O gynecomastia    Hearing loss in right ear  History of kidney stones    History of portal hypertension    Hyperkalemia    Hyperlipidemia    Hypertension    Hyponatremia    IDA (iron deficiency anemia)    Lipodystrophy    Obesity    Odynophagia    Pancreatitis    Refusal of blood transfusions as patient is Jehovah's Witness    Right inguinal hernia    Seizure (HCC)    as a child-age 58-none since-happened from fall as a child   Splenic vein thrombosis    Splenomegaly    Ventral hernia    Vitamin D deficiency      Past Surgical  History:  Procedure Laterality Date   CHOLECYSTECTOMY  2006   COLONOSCOPY WITH ESOPHAGOGASTRODUODENOSCOPY (EGD)     COLONOSCOPY WITH PROPOFOL N/A 11/21/2015   Procedure: COLONOSCOPY WITH PROPOFOL;  Surgeon: Scot Jun, MD;  Location: [facility] ENDOSCOPY;  Service: Endoscopy;  Laterality: N/A;   ESOPHAGOGASTRODUODENOSCOPY (EGD) WITH PROPOFOL N/A 08/21/2019   Procedure: ESOPHAGOGASTRODUODENOSCOPY (EGD) WITH PROPOFOL;  Surgeon: Midge Minium, MD;  Location: [facility] ENDOSCOPY;  Service: Endoscopy;  Laterality: N/A;   HERNIA REPAIR     Inguinal Hernia Repai. Left: 07/28/2013 Dr. Egbert Garibaldi; 2nd repair done 04/30/2014   TONSILLECTOMY      Social History   Socioeconomic History   Marital status: Married    Spouse name: Candy   Number of children: 3   Years of education: Not on file   Highest education level: 12th grade  Occupational History   Occupation: retired  Tobacco Use   Smoking status: Never   Smokeless tobacco: Never  Vaping Use   Vaping status: Never Used  Substance and Sexual Activity   Alcohol use: Not Currently    Comment: previous heavy drinker-quit in 2023   Drug use: No   Sexual activity: Not Currently  Other Topics Concern   Not on file  Social History Narrative   Not on file   Social Determinants of Health   Financial Resource Strain: Low Risk  (02/24/2023)   Overall Financial Resource Strain (CARDIA)    Difficulty of Paying Living Expenses: Not hard at all  Food Insecurity: No Food Insecurity (03/19/2023)   Hunger Vital Sign    Worried About Running Out of Food in the Last Year: Never true    Ran Out of Food in the Last Year: Never true  Transportation Needs: No Transportation Needs (03/19/2023)   PRAPARE - Administrator, Civil Service (Medical): No    Lack of Transportation (Non-Medical): No  Physical Activity: Insufficiently Active (02/24/2023)   Exercise Vital Sign    Days of Exercise per Week: 7 days    Minutes of Exercise per Session: 20 min   Stress: No Stress Concern Present (02/24/2023)   [employer] of Occupational Health - Occupational Stress Questionnaire    Feeling of Stress : Not at all  Social Connections: Moderately Integrated (02/24/2023)   Social Connection and Isolation Panel [NHANES]    Frequency of Communication with Friends and Family: More than three times a week    Frequency of Social Gatherings with Friends and Family: Twice a week    Attends Religious Services: More than 4 times per year    Active Member of [employer] or Organizations: No    Attends Banker Meetings: Never    Marital Status: Married  Recent Concern: Social Connections - Moderately Isolated (02/20/2023)   Social Connection and Isolation Panel [NHANES]    Frequency of Communication  with Friends and Family: More than three times a week    Frequency of Social Gatherings with Friends and Family: More than three times a week    Attends Religious Services: Never    Database administrator or Organizations: No    Attends Banker Meetings: Never    Marital Status: Married  Catering manager Violence: Not At Risk (03/19/2023)   Humiliation, Afraid, Rape, and Kick questionnaire    Fear of Current or Ex-Partner: No    Emotionally Abused: No    Physically Abused: No    Sexually Abused: No    Family History  Problem Relation Age of Onset   Colon polyps Father    Lung cancer Father    Hypertension Father    Dementia Mother    Hypertension Brother    Heart Problems Brother    Hypertension Brother    Diabetes Brother        type 2   Congestive Heart Failure Brother      Current Outpatient Medications:    Accu-Chek Softclix Lancets lancets, Use to check glucose ac and as needed, Disp: 100 each, Rfl: 12   acetaminophen (TYLENOL) 500 MG tablet, Take 1,000 mg by mouth every 6 (six) hours as needed., Disp: , Rfl:    allopurinol (ZYLOPRIM) 100 MG tablet, TAKE 1 TABLET BY MOUTH EVERY DAY (Patient taking differently: Take 100  mg by mouth every morning.), Disp: 90 tablet, Rfl: 4   B-D UF III MINI PEN NEEDLES 31G X 5 MM MISC, USE 1 TO CHECK BLOOD SUGAR 3 TIMES DAILY, Disp: 100 each, Rfl: 4   Continuous Blood Gluc Receiver (FREESTYLE LIBRE 14 DAY READER) DEVI, 1 Device by Does not apply route every 14 (fourteen) days. for type insulin dependent type 2 diabetes (Patient not taking: Reported on 04/04/2023), Disp: 1 each, Rfl: 12   Continuous Blood Gluc Sensor (FREESTYLE LIBRE 14 DAY SENSOR) MISC, Place 1 Device onto the skin every 14 (fourteen) days. for insulin dependent type 2 diabetes (Patient not taking: Reported on 04/04/2023), Disp: 2 each, Rfl: 12   furosemide (LASIX) 20 MG tablet, TAKE 3 TABLETS BY MOUTH EVERY DAY (Patient taking differently: Take 60 mg by mouth every morning.), Disp: 270 tablet, Rfl: 0   insulin degludec (TRESIBA FLEXTOUCH) 100 UNIT/ML FlexTouch Pen, INJECT UP TO 20 UNITS UNDER THE SKIN EVERY DAY AT BEDTIME. NEED APPT FOR REFILLS. (Patient taking differently: 14-20 Units at bedtime.), Disp: 9 mL, Rfl: 1   magnesium gluconate (MAGONATE) 500 MG tablet, Take 500 mg by mouth daily. , Disp: , Rfl:    Multiple Vitamin (MULTIVITAMIN WITH MINERALS) TABS tablet, Take 1 tablet by mouth daily., Disp: , Rfl:    naltrexone (DEPADE) 50 MG tablet, TAKE 1 TABLET BY MOUTH EVERY DAY (Patient taking differently: Take 50 mg by mouth every morning.), Disp: 90 tablet, Rfl: 1   niacin 500 MG tablet, Take 500 mg by mouth at bedtime., Disp: , Rfl:    NOVOLOG FLEXPEN 100 UNIT/ML FlexPen, INJECT 20 UNITS UP TO THREE TIMES A DAY BEFORE MEALS. (Patient taking differently: 5-20 Units 3 (three) times daily with meals. Inject 20 units up to three times a day before meals. SS), Disp: 45 mL, Rfl: 0   omeprazole (PRILOSEC) 40 MG capsule, TAKE 1 CAPSULE BY MOUTH EVERY DAY (Patient taking differently: 40 mg every morning. TAKE 1 CAPSULE BY MOUTH EVERY DAY), Disp: 90 capsule, Rfl: 1   ONETOUCH VERIO test strip, USE TO CHECK SUGAR THREE TIMES  DAILY FOR INSULIN DEPENDENT TYPE 2 DIABETES, Disp: 100 strip, Rfl: 4   promethazine (PHENERGAN) 25 MG tablet, TAKE 1 TABLET BY MOUTH EVERY 8 HOURS AS NEEDED FOR NAUSEA OR VOMITING., Disp: 20 tablet, Rfl: 3   pyridOXINE (VITAMIN B-6) 50 MG tablet, Take 50 mg by mouth daily., Disp: , Rfl:    sertraline (ZOLOFT) 100 MG tablet, TAKE 1 TABLET BY MOUTH EVERY DAY (Patient taking differently: Take 100 mg by mouth every morning.), Disp: 90 tablet, Rfl: 4   traMADol (ULTRAM) 50 MG tablet, TAKE 1 TABLET BY MOUTH EVERY 8 HOURS AS NEEDED., Disp: 15 tablet, Rfl: 2   zinc gluconate 50 MG tablet, Take 50 mg by mouth daily., Disp: , Rfl:    carvedilol (COREG) 3.125 MG tablet, TAKE 1 TABLET BY MOUTH 2 TIMES DAILY., Disp: 180 tablet, Rfl: 0   potassium chloride SA (KLOR-CON M) 20 MEQ tablet, Take 2 tablets (40 mEq) today, then take 1 tablet (20 mEq) daily until surgery. Be sure to take dose on day of procedure. Follow up with PCP for repeat labs., Disp: 6 tablet, Rfl: 0   spironolactone (ALDACTONE) 100 MG tablet, TAKE 1 TABLET BY MOUTH EVERY DAY (Patient taking differently: Take 100 mg by mouth every morning.), Disp: 90 tablet, Rfl: 0  Physical exam:  Vitals:   04/03/23 1427  BP: 107/74  Pulse: 61  Resp: 18  Temp: (!) 97.4 F (36.3 C)  TempSrc: Tympanic  SpO2: 98%  Weight: 146 lb 6.4 oz (66.4 kg)  Height: 5\' 5"  (1.651 m)   Physical Exam Cardiovascular:     Rate and Rhythm: Normal rate and regular rhythm.     Heart sounds: Normal heart sounds.  Pulmonary:     Effort: Pulmonary effort is normal.     Breath sounds: Normal breath sounds.  Abdominal:     General: Bowel sounds are normal.     Palpations: Abdomen is soft.  Skin:    General: Skin is warm and dry.  Neurological:     Mental Status: He is alert and oriented to person, place, and time.         Latest Ref Rng & Units 04/05/2023   10:28 AM  CMP  Glucose 70 - 99 mg/dL 161   BUN 8 - 23 mg/dL 11   Creatinine 0.96 - 1.24 mg/dL 0.45   Sodium  409 - 811 mmol/L 137   Potassium 3.5 - 5.1 mmol/L 3.0   Chloride 98 - 111 mmol/L 99   CO2 22 - 32 mmol/L 30   Calcium 8.9 - 10.3 mg/dL 8.5   Total Protein 6.5 - 8.1 g/dL 6.5   Total Bilirubin 0.3 - 1.2 mg/dL 1.8   Alkaline Phos 38 - 126 U/L 177   AST 15 - 41 U/L 26   ALT 0 - 44 U/L 17       Latest Ref Rng & Units 04/05/2023   10:28 AM  CBC  WBC 4.0 - 10.5 K/uL 6.0   Hemoglobin 13.0 - 17.0 g/dL 91.4   Hematocrit 78.2 - 52.0 % 35.1   Platelets 150 - 400 K/uL 166     Assessment and plan- Patient is a 72 y.o. male here for routine f/u for anemia  Has mild normocytic anemia with his hemoglobin around 11-12 which I suspect is secondary to chronic disease.  His anemia workup including iron studies were normal.No evidence of hemolysis.  Recently check B12 and folate levels were normal.  Myeloma panel showed no M protein on  SPEP but a small amount of IgG lambda protein detected on immunofixation which we will monitor on a yearly basis.  Both kappa and lambda light chains were elevated but the total ratio was normal and therefore not indicated of plasma cell dyscrasia.  CBC ferritin and iron studies in 3 and 6 months and I will see him back in 6 months   Visit Diagnosis 1. Normocytic anemia      Dr. Owens Shark, MD, MPH [facility] at [facility] [phone] 04/06/2023 3:28 PM

## 2023-04-10 ENCOUNTER — Other Ambulatory Visit: Payer: Self-pay

## 2023-04-10 ENCOUNTER — Ambulatory Visit: Payer: Self-pay

## 2023-04-10 ENCOUNTER — Encounter: Payer: Self-pay | Admitting: Surgery

## 2023-04-10 ENCOUNTER — Ambulatory Visit
Admission: RE | Admit: 2023-04-10 | Discharge: 2023-04-10 | Disposition: A | Discharge status: Home / Self Care | Payer: PPO | Attending: Surgery | Admitting: Surgery

## 2023-04-10 ENCOUNTER — Encounter
Admission: RE | Disposition: A | Discharge status: Home / Self Care | Payer: Self-pay | Source: Home / Self Care | Attending: Surgery

## 2023-04-10 ENCOUNTER — Ambulatory Visit: Payer: Self-pay | Admitting: Urgent Care

## 2023-04-10 DIAGNOSIS — K219 Gastro-esophageal reflux disease without esophagitis: Secondary | ICD-10-CM | POA: Diagnosis not present

## 2023-04-10 DIAGNOSIS — I10 Essential (primary) hypertension: Secondary | ICD-10-CM | POA: Diagnosis not present

## 2023-04-10 DIAGNOSIS — E119 Type 2 diabetes mellitus without complications: Secondary | ICD-10-CM | POA: Insufficient documentation

## 2023-04-10 DIAGNOSIS — K432 Incisional hernia without obstruction or gangrene: Secondary | ICD-10-CM

## 2023-04-10 DIAGNOSIS — Z794 Long term (current) use of insulin: Secondary | ICD-10-CM | POA: Insufficient documentation

## 2023-04-10 DIAGNOSIS — K429 Umbilical hernia without obstruction or gangrene: Secondary | ICD-10-CM | POA: Diagnosis not present

## 2023-04-10 DIAGNOSIS — Z79899 Other long term (current) drug therapy: Secondary | ICD-10-CM

## 2023-04-10 DIAGNOSIS — Z9049 Acquired absence of other specified parts of digestive tract: Secondary | ICD-10-CM | POA: Insufficient documentation

## 2023-04-10 DIAGNOSIS — K7031 Alcoholic cirrhosis of liver with ascites: Secondary | ICD-10-CM | POA: Diagnosis not present

## 2023-04-10 DIAGNOSIS — E876 Hypokalemia: Secondary | ICD-10-CM

## 2023-04-10 DIAGNOSIS — Z9889 Other specified postprocedural states: Secondary | ICD-10-CM | POA: Insufficient documentation

## 2023-04-10 DIAGNOSIS — K439 Ventral hernia without obstruction or gangrene: Secondary | ICD-10-CM | POA: Diagnosis not present

## 2023-04-10 DIAGNOSIS — Z01812 Encounter for preprocedural laboratory examination: Secondary | ICD-10-CM

## 2023-04-10 DIAGNOSIS — Z01818 Encounter for other preprocedural examination: Secondary | ICD-10-CM

## 2023-04-10 DIAGNOSIS — I8510 Secondary esophageal varices without bleeding: Secondary | ICD-10-CM | POA: Diagnosis not present

## 2023-04-10 HISTORY — PX: XI ROBOTIC ASSISTED VENTRAL HERNIA: SHX6789

## 2023-04-10 HISTORY — PX: INSERTION OF MESH: SHX5868

## 2023-04-10 LAB — POCT I-STAT, CHEM 8
BUN: 9 mg/dL (ref 8–23)
Calcium, Ion: 1.11 mmol/L — ABNORMAL LOW (ref 1.15–1.40)
Chloride: 96 mmol/L — ABNORMAL LOW (ref 98–111)
Creatinine, Ser: 0.7 mg/dL (ref 0.61–1.24)
Glucose, Bld: 110 mg/dL — ABNORMAL HIGH (ref 70–99)
HCT: 34 % — ABNORMAL LOW (ref 39.0–52.0)
Hemoglobin: 11.6 g/dL — ABNORMAL LOW (ref 13.0–17.0)
Potassium: 3.7 mmol/L (ref 3.5–5.1)
Sodium: 138 mmol/L (ref 135–145)
TCO2: 27 mmol/L (ref 22–32)

## 2023-04-10 LAB — NO BLOOD PRODUCTS

## 2023-04-10 LAB — GLUCOSE, CAPILLARY: Glucose-Capillary: 173 mg/dL — ABNORMAL HIGH (ref 70–99)

## 2023-04-10 SURGERY — REPAIR, HERNIA, VENTRAL, ROBOT-ASSISTED
Anesthesia: General | Site: Abdomen

## 2023-04-10 MED ORDER — DEXAMETHASONE SODIUM PHOSPHATE 10 MG/ML IJ SOLN
INTRAMUSCULAR | Status: AC
  Filled 2023-04-10: qty 1

## 2023-04-10 MED ORDER — OXYCODONE HCL 5 MG PO TABS
ORAL_TABLET | ORAL | Status: AC
  Filled 2023-04-10: qty 1

## 2023-04-10 MED ORDER — DROPERIDOL 2.5 MG/ML IJ SOLN: 0.6250 mg | Freq: Once | INTRAMUSCULAR | Status: DC | PRN

## 2023-04-10 MED ORDER — LIDOCAINE HCL (PF) 2 % IJ SOLN
INTRAMUSCULAR | Status: AC
  Filled 2023-04-10: qty 5

## 2023-04-10 MED ORDER — ORAL CARE MOUTH RINSE: 15.0000 mL | Freq: Once | OROMUCOSAL | Status: AC

## 2023-04-10 MED ORDER — BUPIVACAINE LIPOSOME 1.3 % IJ SUSP: 20.0000 mL | Freq: Once | INTRAMUSCULAR | Status: DC

## 2023-04-10 MED ORDER — CHLORHEXIDINE GLUCONATE 0.12 % MT SOLN
OROMUCOSAL | Status: AC
  Filled 2023-04-10: qty 15

## 2023-04-10 MED ORDER — CHLORHEXIDINE GLUCONATE CLOTH 2 % EX PADS
6.0000 | MEDICATED_PAD | Freq: Once | CUTANEOUS | Status: AC
  Administered 2023-04-10: 6 via TOPICAL

## 2023-04-10 MED ORDER — SODIUM CHLORIDE 0.9 % IV BOLUS
500.0000 mL | Freq: Once | INTRAVENOUS | Status: AC
  Administered 2023-04-10: 500 mL via INTRAVENOUS

## 2023-04-10 MED ORDER — ONDANSETRON HCL 4 MG/2ML IJ SOLN
INTRAMUSCULAR | Status: DC | PRN
  Administered 2023-04-10: 4 mg via INTRAVENOUS

## 2023-04-10 MED ORDER — ONDANSETRON HCL 4 MG/2ML IJ SOLN
INTRAMUSCULAR | Status: AC
  Filled 2023-04-10: qty 2

## 2023-04-10 MED ORDER — PROPOFOL 10 MG/ML IV BOLUS
INTRAVENOUS | Status: AC
  Filled 2023-04-10: qty 40

## 2023-04-10 MED ORDER — BUPIVACAINE-EPINEPHRINE 0.25% -1:200000 IJ SOLN
INTRAMUSCULAR | Status: DC | PRN
  Administered 2023-04-10: 50 mL

## 2023-04-10 MED ORDER — LIDOCAINE HCL (CARDIAC) PF 100 MG/5ML IV SOSY
PREFILLED_SYRINGE | INTRAVENOUS | Status: DC | PRN
  Administered 2023-04-10: 80 mg via INTRAVENOUS

## 2023-04-10 MED ORDER — GABAPENTIN 300 MG PO CAPS
300.0000 mg | ORAL_CAPSULE | ORAL | Status: AC
  Administered 2023-04-10: 300 mg via ORAL

## 2023-04-10 MED ORDER — ACETAMINOPHEN 500 MG PO TABS
1000.0000 mg | ORAL_TABLET | ORAL | Status: AC
  Administered 2023-04-10: 1000 mg via ORAL

## 2023-04-10 MED ORDER — CEFAZOLIN SODIUM-DEXTROSE 2-4 GM/100ML-% IV SOLN
INTRAVENOUS | Status: AC
  Filled 2023-04-10: qty 100

## 2023-04-10 MED ORDER — FENTANYL CITRATE (PF) 100 MCG/2ML IJ SOLN
INTRAMUSCULAR | Status: DC | PRN
  Administered 2023-04-10: 50 ug via INTRAVENOUS

## 2023-04-10 MED ORDER — SUGAMMADEX SODIUM 200 MG/2ML IV SOLN
INTRAVENOUS | Status: DC | PRN
  Administered 2023-04-10: 200 mg via INTRAVENOUS

## 2023-04-10 MED ORDER — FENTANYL CITRATE (PF) 100 MCG/2ML IJ SOLN: 25.0000 ug | INTRAMUSCULAR | Status: DC | PRN

## 2023-04-10 MED ORDER — CELECOXIB 200 MG PO CAPS
ORAL_CAPSULE | ORAL | Status: AC
  Filled 2023-04-10: qty 1

## 2023-04-10 MED ORDER — ACETAMINOPHEN 500 MG PO TABS
ORAL_TABLET | ORAL | Status: AC
  Filled 2023-04-10: qty 2

## 2023-04-10 MED ORDER — CELECOXIB 200 MG PO CAPS
200.0000 mg | ORAL_CAPSULE | ORAL | Status: AC
  Administered 2023-04-10: 200 mg via ORAL

## 2023-04-10 MED ORDER — PHENYLEPHRINE HCL-NACL 20-0.9 MG/250ML-% IV SOLN
INTRAVENOUS | Status: AC
  Filled 2023-04-10: qty 250

## 2023-04-10 MED ORDER — OXYCODONE HCL 5 MG PO TABS: 5.0000 mg | ORAL_TABLET | Freq: Four times a day (QID) | ORAL | 0 refills | Status: DC | PRN

## 2023-04-10 MED ORDER — PROPOFOL 10 MG/ML IV BOLUS
INTRAVENOUS | Status: DC | PRN
  Administered 2023-04-10: 100 mg via INTRAVENOUS

## 2023-04-10 MED ORDER — GABAPENTIN 300 MG PO CAPS
ORAL_CAPSULE | ORAL | Status: AC
  Filled 2023-04-10: qty 1

## 2023-04-10 MED ORDER — BUPIVACAINE LIPOSOME 1.3 % IJ SUSP
INTRAMUSCULAR | Status: AC
  Filled 2023-04-10: qty 20

## 2023-04-10 MED ORDER — BUPIVACAINE-EPINEPHRINE (PF) 0.25% -1:200000 IJ SOLN
INTRAMUSCULAR | Status: AC
  Filled 2023-04-10: qty 30

## 2023-04-10 MED ORDER — ROCURONIUM BROMIDE 100 MG/10ML IV SOLN
INTRAVENOUS | Status: DC | PRN
  Administered 2023-04-10: 50 mg via INTRAVENOUS
  Administered 2023-04-10: 20 mg via INTRAVENOUS

## 2023-04-10 MED ORDER — OXYCODONE HCL 5 MG PO TABS
5.0000 mg | ORAL_TABLET | Freq: Once | ORAL | Status: AC | PRN
  Administered 2023-04-10: 5 mg via ORAL

## 2023-04-10 MED ORDER — MIDAZOLAM HCL 2 MG/2ML IJ SOLN
INTRAMUSCULAR | Status: AC
  Filled 2023-04-10: qty 2

## 2023-04-10 MED ORDER — ROCURONIUM BROMIDE 10 MG/ML (PF) SYRINGE
PREFILLED_SYRINGE | INTRAVENOUS | Status: AC
  Filled 2023-04-10: qty 10

## 2023-04-10 MED ORDER — CHLORHEXIDINE GLUCONATE 0.12 % MT SOLN
15.0000 mL | Freq: Once | OROMUCOSAL | Status: AC
  Administered 2023-04-10: 15 mL via OROMUCOSAL

## 2023-04-10 MED ORDER — MIDAZOLAM HCL 2 MG/2ML IJ SOLN
INTRAMUSCULAR | Status: DC | PRN
  Administered 2023-04-10: 1 mg via INTRAVENOUS

## 2023-04-10 MED ORDER — ACETAMINOPHEN 10 MG/ML IV SOLN: 1000.0000 mg | Freq: Once | INTRAVENOUS | Status: DC | PRN

## 2023-04-10 MED ORDER — FENTANYL CITRATE (PF) 100 MCG/2ML IJ SOLN
INTRAMUSCULAR | Status: AC
  Filled 2023-04-10: qty 2

## 2023-04-10 MED ORDER — SODIUM CHLORIDE 0.9 % IV SOLN: INTRAVENOUS | Status: DC

## 2023-04-10 MED ORDER — OXYCODONE HCL 5 MG/5ML PO SOLN: 5.0000 mg | Freq: Once | ORAL | Status: AC | PRN

## 2023-04-10 MED ORDER — PHENYLEPHRINE HCL-NACL 20-0.9 MG/250ML-% IV SOLN
INTRAVENOUS | Status: DC | PRN
Start: 2023-04-10 — End: 2023-04-10
  Administered 2023-04-10: 30 ug/min via INTRAVENOUS

## 2023-04-10 MED ORDER — CEFAZOLIN SODIUM-DEXTROSE 2-4 GM/100ML-% IV SOLN
2.0000 g | INTRAVENOUS | Status: AC
  Administered 2023-04-10: 2 g via INTRAVENOUS

## 2023-04-10 MED ORDER — DEXAMETHASONE SODIUM PHOSPHATE 10 MG/ML IJ SOLN
INTRAMUSCULAR | Status: DC | PRN
  Administered 2023-04-10: 5 mg via INTRAVENOUS

## 2023-04-10 SURGICAL SUPPLY — 41 items
ADH SKN CLS APL DERMABOND .7 (GAUZE/BANDAGES/DRESSINGS) ×1
APL PRP STRL LF DISP 70% ISPRP (MISCELLANEOUS) ×1
CHLORAPREP W/TINT 26 (MISCELLANEOUS) ×2 IMPLANT
COVER TIP SHEARS 8 DVNC (MISCELLANEOUS) ×2 IMPLANT
COVER WAND RF STERILE (DRAPES) ×2 IMPLANT
DERMABOND ADVANCED .7 DNX12 (GAUZE/BANDAGES/DRESSINGS) ×2 IMPLANT
DRAPE ARM DVNC X/XI (DISPOSABLE) ×6 IMPLANT
DRAPE COLUMN DVNC XI (DISPOSABLE) ×2 IMPLANT
ELECT REM PT RETURN 9FT ADLT (ELECTROSURGICAL) ×1
ELECTRODE REM PT RTRN 9FT ADLT (ELECTROSURGICAL) ×2 IMPLANT
FORCEPS BPLR R/ABLATION 8 DVNC (INSTRUMENTS) ×2 IMPLANT
GLOVE ORTHO TXT STRL SZ7.5 (GLOVE) ×4 IMPLANT
GOWN STRL REUS W/ TWL LRG LVL3 (GOWN DISPOSABLE) ×2 IMPLANT
GOWN STRL REUS W/ TWL XL LVL3 (GOWN DISPOSABLE) ×4 IMPLANT
GOWN STRL REUS W/TWL LRG LVL3 (GOWN DISPOSABLE) ×1
GOWN STRL REUS W/TWL XL LVL3 (GOWN DISPOSABLE) ×2
GRASPER SUT TROCAR 14GX15 (MISCELLANEOUS) IMPLANT
KIT PINK PAD W/HEAD ARE REST (MISCELLANEOUS) ×1
KIT PINK PAD W/HEAD ARM REST (MISCELLANEOUS) ×2 IMPLANT
MESH VENTRALIGHT ST 4.5IN (Mesh General) IMPLANT
NDL DRIVE SUT CUT DVNC (INSTRUMENTS) ×2 IMPLANT
NDL HYPO 22X1.5 SAFETY MO (MISCELLANEOUS) ×2 IMPLANT
NDL INSUFFLATION 14GA 120MM (NEEDLE) ×2 IMPLANT
NEEDLE DRIVE SUT CUT DVNC (INSTRUMENTS) ×1 IMPLANT
NEEDLE HYPO 22X1.5 SAFETY MO (MISCELLANEOUS) ×1 IMPLANT
NEEDLE INSUFFLATION 14GA 120MM (NEEDLE) ×1 IMPLANT
NS IRRIG 500ML POUR BTL (IV SOLUTION) ×2 IMPLANT
PACK LAP CHOLECYSTECTOMY (MISCELLANEOUS) ×2 IMPLANT
SCISSORS MNPLR CVD DVNC XI (INSTRUMENTS) ×2 IMPLANT
SEAL UNIV 5-12 XI (MISCELLANEOUS) ×6 IMPLANT
SET TUBE SMOKE EVAC HIGH FLOW (TUBING) ×2 IMPLANT
SOL ELECTROSURG ANTI STICK (MISCELLANEOUS) ×1
SOLUTION ELECTROSURG ANTI STCK (MISCELLANEOUS) ×2 IMPLANT
SUT MNCRL 4-0 (SUTURE) ×1
SUT MNCRL 4-0 27XMFL (SUTURE) ×1
SUT STRATAFIX 0 PDS+ CT-2 23 (SUTURE) ×1
SUT VICRYL 0 UR6 27IN ABS (SUTURE) ×2 IMPLANT
SUT VLOC 90 S/L VL9 GS22 (SUTURE) ×4 IMPLANT
SUTURE MNCRL 4-0 27XMF (SUTURE) ×2 IMPLANT
SUTURE STRATFX 0 PDS+ CT-2 23 (SUTURE) IMPLANT
TROCAR Z-THREAD FIOS 11X100 BL (TROCAR) ×2 IMPLANT

## 2023-04-10 NOTE — Progress Notes (Signed)
Awake/alert x4  tolerating crackers and gingerale without event.

## 2023-04-10 NOTE — Op Note (Signed)
Robotic assisted laparoscopic anterior abdominal hernia repair, IPOM.    Pre-operative Diagnosis: Anterior abdominal hernia, preop estimated fascial defect size  >3 cm.   Post-operative Diagnosis: same, except defect at 2 cm.    Surgeon:  Campbell Lerner, M.D., FACS   Anesthesia: Gen. with endotracheal tube   Findings:  2 CM umbilical fascial defect.  Estimated Blood Loss: 3 mL   Complications: none           Procedure Details  The patient was seen again in the Holding Room. The benefits, complications, treatment options, and expected outcomes were discussed with the patient. The risks of bleeding, infection, recurrence of symptoms, failure to resolve symptoms, bowel injury, mesh placement, mesh infection, any of which could require further surgery were reviewed with the patient. The likelihood of improving the patient's symptoms with return to their baseline status is good.  The patient and/or family concurred with the proposed plan, giving informed consent.  The patient was taken to Operating Room, identified and the procedure verified.  A Time Out was held and the above information confirmed.   Prior to the induction of general anesthesia, antibiotic prophylaxis was administered. VTE prophylaxis was in place. General endotracheal anesthesia was then administered and tolerated well. After the induction, the abdomen was prepped with Chloraprep and draped in the sterile fashion. The patient was positioned in the supine position.  After local infiltration of quarter percent Marcaine with epinephrine, stab incision was made left upper quadrant.  Just below the costal margin approximately midclavicular line the Veress needle is passed with sensation of the layers to penetrate the abdominal wall and into the peritoneum.  Saline drop test is confirmed peritoneal placement.  Insufflation is initiated with carbon dioxide to pressures of 15 mmHg. Marcaine quarter percent  with Exparel was used to  inject all the incision sites. We used a left upper quadrant subcostal incision and using an [employer] FIOS 11 mm trocar, it was inserted with direct visualization and pneumoperitoneum maintained.  No hemodynamic compromise, no evidence of intraperitoneal injury.   2 additional 8 mm ports were placed under direct visualization on the left lateral abdomen.  I visualized the hernia and there was a ascitic cystic contents within a 2 cm umbilical hernia.    The robot was brought to the surgical field and docked in the standard fashion.  We made sure that all instrumentation was kept under direct vision at all times and there was no collision between the arms.  I scrubbed out and went to the console. Falciform and adjacent preperitoneal adipose and umbilical ligaments and hernia sac were taken down with electrocautery to allow adequate mesh placement to be secured to the fascial tissue. Confirm and measured that the defect was 2 cm.  .  Using a 0 Stratafix suture we closed the ventral defect primarily.  At this time I went ahead and inserted the Ventralight ST mesh of 11 cm diameter. I used the same suture to secure the mesh and centered it over the fascial closure.    The mesh was secured circumferentially to the abdominal wall using 2-0 V-lock in the standard fashion.   The mesh appeared well secured against the  abdominal wall. A second look laparoscopy revealed no evidence of intra-abdominal injury and good hemostasis.    All the needles were removed under direct visualization.  The instruments were removed and the robot was undocked.  The laparoscopic ports were removed under direct visualization and the pneumoperitoneum was deflated.  The fascial sutures approximated in the standard fashion,  utilizing the PMI under direct visualization. Incisions were closed with  4-0 Monocryl   Dermabond was used to coat the skin.  Patient tolerated procedure well and there were no immediate complications.  Needle and laparotomy counts were correct   Campbell Lerner M.D., [facility] 04/10/2023 10:23 AM

## 2023-04-10 NOTE — Interval H&P Note (Signed)
History and Physical Interval Note:  04/10/2023 8:13 AM  [name]  has presented today for surgery, with the diagnosis of ventral hernia greater 3 cm initial.  The various methods of treatment have been discussed with the patient and family. After consideration of risks, benefits and other options for treatment, the patient has consented to  Procedure(s): XI ROBOTIC ASSISTED VENTRAL HERNIA (N/A) as a surgical intervention.  The patient's history has been reviewed, patient examined, no change in status, stable for surgery.  I have reviewed the patient's chart and labs.  Questions were answered to the patient's satisfaction.     Campbell Lerner

## 2023-04-10 NOTE — Transfer of Care (Signed)
Immediate Anesthesia Transfer of Care Note  Patient: [name]  Procedure(s) Performed: XI ROBOTIC ASSISTED VENTRAL HERNIA (Abdomen)  Patient Location: PACU  Anesthesia Type:General  Level of Consciousness: awake and alert   Airway & Oxygen Therapy: Patient Spontanous Breathing and Patient connected to face mask oxygen  Post-op Assessment: Report given to RN and Post -op Vital signs reviewed and stable  Post vital signs: Reviewed and stable  Last Vitals:  Vitals Value Taken Time  BP 102/72 04/10/23 1019  Temp 36.3 C 04/10/23 1019  Pulse 57 04/10/23 1019  Resp 18 04/10/23 1019  SpO2 99 % 04/10/23 1019    Last Pain:  Vitals:   04/10/23 0709  TempSrc: Temporal  PainSc: 0-No pain         Complications: No notable events documented.

## 2023-04-10 NOTE — Discharge Instructions (Signed)
AMBULATORY SURGERY  DISCHARGE INSTRUCTIONS   The drugs that you were given will stay in your system until tomorrow so for the next 24 hours you should not:  Drive an automobile Make any legal decisions Drink any alcoholic beverage   You may resume regular meals tomorrow.  Today it is better to start with liquids and gradually work up to solid foods.  You may eat anything you prefer, but it is better to start with liquids, then soup and crackers, and gradually work up to solid foods.   Please notify your doctor immediately if you have any unusual bleeding, trouble breathing, redness and pain at the surgery site, drainage, fever, or pain not relieved by medication.   Additional Instructions:  leave green armband on for 4 days

## 2023-04-10 NOTE — Anesthesia Postprocedure Evaluation (Signed)
Anesthesia Post Note  Patient: [name]  Procedure(s) Performed: XI ROBOTIC ASSISTED VENTRAL HERNIA (Abdomen) INSERTION OF MESH (Abdomen)  Patient location during evaluation: Endoscopy Anesthesia Type: General Level of consciousness: awake and alert Pain management: pain level controlled Vital Signs Assessment: post-procedure vital signs reviewed and stable Respiratory status: spontaneous breathing, nonlabored ventilation and respiratory function stable Cardiovascular status: blood pressure returned to baseline and stable Postop Assessment: no apparent nausea or vomiting Anesthetic complications: no   No notable events documented.   Last Vitals:  Vitals:   04/10/23 1115 04/10/23 1134  BP: 90/65 93/66  Pulse: 66 60  Resp: 19 18  Temp:  (!) 36.4 C  SpO2: 96% 96%    Last Pain:  Vitals:   04/10/23 1134  TempSrc: Temporal  PainSc: 3                  Foye Deer

## 2023-04-10 NOTE — Anesthesia Preprocedure Evaluation (Addendum)
Anesthesia Evaluation  Patient identified by MRN, date of birth, ID band Patient awake    Reviewed: Allergy & Precautions, H&P , NPO status , Patient's Chart, lab work & pertinent test results  Airway Mallampati: II  TM Distance: >3 FB Neck ROM: full    Dental no notable dental hx.    Pulmonary neg pulmonary ROS   Pulmonary exam normal        Cardiovascular Exercise Tolerance: Good hypertension, Normal cardiovascular exam     Neuro/Psych negative neurological ROS  negative psych ROS   GI/Hepatic ,GERD  Controlled and Medicated,,(+) Cirrhosis   Esophageal Varices      Endo/Other  diabetes, Type 2, Insulin Dependent    Renal/GU      Musculoskeletal   Abdominal   Peds  Hematology  (+) Blood dyscrasia, anemia   Anesthesia Other Findings ventral hernia greater 3 cm initial  HYPOkalemic at 3.0 mmol/L on preoperative labs  Past Medical History: No date: Alcohol abuse No date: Alcoholism (HCC) No date: Anxiety No date: Aortic atherosclerosis (HCC) No date: Arthritis No date: Ascites No date: Cancer (HCC) No date: Cirrhosis with alcoholism (HCC) No date: Colon polyp No date: COVID-19 No date: DDD (degenerative disc disease), cervical No date: Depression No date: DM (diabetes mellitus), type 2 (HCC) No date: Dyspnea No date: ED (erectile dysfunction) No date: Elevated liver enzymes No date: Esophageal varices (HCC) No date: Esophagitis No date: Gastric catarrh No date: Gastric varices No date: Gastritis No date: GERD (gastroesophageal reflux disease) No date: Gout No date: H. 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 No date: Splenomegaly No date: Ventral hernia No date: Vitamin D deficiency  Past Surgical History: 2006: CHOLECYSTECTOMY No date: COLONOSCOPY WITH ESOPHAGOGASTRODUODENOSCOPY (EGD) 11/21/2015: COLONOSCOPY WITH PROPOFOL; N/A     Comment:  Procedure: COLONOSCOPY WITH PROPOFOL;  Surgeon: Scot Jun, MD;  Location: [facility] ENDOSCOPY;  Service:               Endoscopy;  Laterality: N/A; 08/21/2019: ESOPHAGOGASTRODUODENOSCOPY (EGD) WITH PROPOFOL; N/A     Comment:  Procedure: ESOPHAGOGASTRODUODENOSCOPY (EGD) WITH               PROPOFOL;  Surgeon: Midge Minium, MD;  Location: [facility]               ENDOSCOPY;  Service: Endoscopy;  Laterality: N/A; No date: HERNIA REPAIR     Comment:  Inguinal Hernia Repai. Left: 07/28/2013 Dr. Egbert Garibaldi; 2nd               repair done 04/30/2014 No date: TONSILLECTOMY     Reproductive/Obstetrics negative OB ROS                              Anesthesia Physical Anesthesia Plan  ASA: 3  Anesthesia Plan: General ETT   Post-op Pain Management: Celebrex PO (pre-op)*, Gabapentin PO (pre-op)* and Tylenol PO (pre-op)*   Induction: Intravenous  PONV Risk Score and Plan: 2 and Ondansetron, Dexamethasone and Midazolam  Airway Management Planned: Oral ETT  Additional Equipment:   Intra-op Plan:   Post-operative Plan: Extubation in OR  Informed Consent: I have reviewed the patients History and Physical, chart, labs and discussed the procedure including the risks, benefits and alternatives for the proposed anesthesia with the patient or authorized representative who has indicated his/her understanding and acceptance.     Dental Advisory  Given  Plan Discussed with: CRNA and Surgeon  Anesthesia Plan Comments:          Anesthesia Quick Evaluation

## 2023-04-10 NOTE — Anesthesia Procedure Notes (Signed)
Procedure Name: Intubation Date/Time: 04/10/2023 8:31 AM  Performed by: Carolynne Edouard, RNPre-anesthesia Checklist: Patient identified, Patient being monitored, Timeout performed, Emergency Drugs available and Suction available Patient Re-evaluated:Patient Re-evaluated prior to induction Oxygen Delivery Method: Circle system utilized Preoxygenation: Pre-oxygenation with 100% oxygen Induction Type: IV induction Ventilation: Mask ventilation without difficulty and Oral airway inserted - appropriate to patient size Laryngoscope Size: Mac and 3 Grade View: Grade I Tube type: Oral Tube size: 7.5 mm Number of attempts: 1 Airway Equipment and Method: Stylet Placement Confirmation: ETT inserted through vocal cords under direct vision, positive ETCO2 and breath sounds checked- equal and bilateral Secured at: 21 cm Tube secured with: Tape Dental Injury: Teeth and Oropharynx as per pre-operative assessment

## 2023-04-11 ENCOUNTER — Encounter: Payer: Self-pay | Admitting: Surgery

## 2023-04-12 ENCOUNTER — Ambulatory Visit: Payer: HMO | Admitting: Family Medicine

## 2023-04-19 ENCOUNTER — Ambulatory Visit: Payer: HMO | Admitting: Family Medicine

## 2023-04-19 VITALS — BP 116/69 | HR 62 | Temp 98.4°F | Ht 65.0 in | Wt 148.0 lb

## 2023-04-19 DIAGNOSIS — K7031 Alcoholic cirrhosis of liver with ascites: Secondary | ICD-10-CM | POA: Diagnosis not present

## 2023-04-19 MED ORDER — OXYCODONE HCL 5 MG PO TABS: 5.0000 mg | ORAL_TABLET | Freq: Four times a day (QID) | ORAL | 0 refills | Status: DC | PRN

## 2023-04-20 LAB — CBC
Hematocrit: 34.6 % — ABNORMAL LOW (ref 37.5–51.0)
Hemoglobin: 11.6 g/dL — ABNORMAL LOW (ref 13.0–17.7)
MCH: 32 pg (ref 26.6–33.0)
MCHC: 33.5 g/dL (ref 31.5–35.7)
MCV: 95 fL (ref 79–97)
Platelets: 195 10*3/uL (ref 150–450)
RBC: 3.63 x10E6/uL — ABNORMAL LOW (ref 4.14–5.80)
RDW: 14 % (ref 11.6–15.4)
WBC: 4.9 10*3/uL (ref 3.4–10.8)

## 2023-04-20 LAB — COMPREHENSIVE METABOLIC PANEL
ALT: 16 [IU]/L (ref 0–44)
AST: 33 [IU]/L (ref 0–40)
Albumin: 3.2 g/dL — ABNORMAL LOW (ref 3.8–4.8)
Alkaline Phosphatase: 223 [IU]/L — ABNORMAL HIGH (ref 44–121)
BUN/Creatinine Ratio: 15 (ref 10–24)
BUN: 9 mg/dL (ref 8–27)
Bilirubin Total: 1 mg/dL (ref 0.0–1.2)
CO2: 24 mmol/L (ref 20–29)
Calcium: 8.5 mg/dL — ABNORMAL LOW (ref 8.6–10.2)
Chloride: 97 mmol/L (ref 96–106)
Creatinine, Ser: 0.62 mg/dL — ABNORMAL LOW (ref 0.76–1.27)
Globulin, Total: 3.1 g/dL (ref 1.5–4.5)
Glucose: 251 mg/dL — ABNORMAL HIGH (ref 70–99)
Potassium: 5.3 mmol/L — ABNORMAL HIGH (ref 3.5–5.2)
Sodium: 133 mmol/L — ABNORMAL LOW (ref 134–144)
Total Protein: 6.3 g/dL (ref 6.0–8.5)
eGFR: 102 mL/min/{1.73_m2} (ref >59)

## 2023-04-22 NOTE — Progress Notes (Signed)
Established patient visit   Patient: [name]   DOB: Jun 04, 1951   72 y.o. Male  MRN: [id number] Visit Date: 04/19/2023  Today's healthcare provider: Mila Merry, MD   Chief Complaint  Patient presents with   Follow-up   Subjective    Discussed the use of AI scribe software for clinical note transcription with the patient, who gave verbal consent to proceed.  History of Present Illness   The patient, with a history of cirrhosis, presented with concerns of fluid retention in the abdomen following an umbilical hernia surgery performed the previous week. He reported a sensation of bloating and constant pain in the abdomen. The patient also noted swelling in the scrotal area, which was not red or painful but caused discomfort when sitting.  In addition to the post-surgical symptoms, the patient requested a refill of his hydrocodone prescription for pain management, stating that the medication provided relief. He reported having used up the fifteen pills previously prescribed by the surgeon.  The patient is on a regimen of Spironolactone (100mg ) and Furosemide (20mg , three times daily) for fluid management. However, he felt the current dosage was insufficient in controlling the fluid retention. He states he is not currently taking any potassium supplements.       Medications: Outpatient Medications Prior to Visit  Medication Sig   Accu-Chek Softclix Lancets lancets Use to check glucose ac and as needed   acetaminophen (TYLENOL) 500 MG tablet Take 1,000 mg by mouth every 6 (six) hours as needed.   allopurinol (ZYLOPRIM) 100 MG tablet TAKE 1 TABLET BY MOUTH EVERY DAY (Patient taking differently: Take 100 mg by mouth every morning.)   B-D UF III MINI PEN NEEDLES 31G X 5 MM MISC USE 1 TO CHECK BLOOD SUGAR 3 TIMES DAILY   carvedilol (COREG) 3.125 MG tablet TAKE 1 TABLET BY MOUTH 2 TIMES DAILY.   Continuous Blood Gluc Receiver (FREESTYLE LIBRE 14 DAY READER) DEVI 1 Device by Does  not apply route every 14 (fourteen) days. for type insulin dependent type 2 diabetes   Continuous Blood Gluc Sensor (FREESTYLE LIBRE 14 DAY SENSOR) MISC Place 1 Device onto the skin every 14 (fourteen) days. for insulin dependent type 2 diabetes   furosemide (LASIX) 20 MG tablet TAKE 3 TABLETS BY MOUTH EVERY DAY (Patient taking differently: Take 60 mg by mouth every morning.)   insulin degludec (TRESIBA FLEXTOUCH) 100 UNIT/ML FlexTouch Pen INJECT UP TO 20 UNITS UNDER THE SKIN EVERY DAY AT BEDTIME. NEED APPT FOR REFILLS. (Patient taking differently: 14-20 Units at bedtime.)   magnesium gluconate (MAGONATE) 500 MG tablet Take 500 mg by mouth daily.    Multiple Vitamin (MULTIVITAMIN WITH MINERALS) TABS tablet Take 1 tablet by mouth daily.   naltrexone (DEPADE) 50 MG tablet TAKE 1 TABLET BY MOUTH EVERY DAY (Patient taking differently: Take 50 mg by mouth every morning.)   niacin 500 MG tablet Take 500 mg by mouth at bedtime.   NOVOLOG FLEXPEN 100 UNIT/ML FlexPen INJECT 20 UNITS UP TO THREE TIMES A DAY BEFORE MEALS. (Patient taking differently: 5-20 Units 3 (three) times daily with meals. Inject 20 units up to three times a day before meals. SS)   omeprazole (PRILOSEC) 40 MG capsule TAKE 1 CAPSULE BY MOUTH EVERY DAY (Patient taking differently: 40 mg every morning. TAKE 1 CAPSULE BY MOUTH EVERY DAY)   ONETOUCH VERIO test strip USE TO CHECK SUGAR THREE TIMES DAILY FOR INSULIN DEPENDENT TYPE 2 DIABETES   promethazine (PHENERGAN) 25  MG tablet TAKE 1 TABLET BY MOUTH EVERY 8 HOURS AS NEEDED FOR NAUSEA OR VOMITING.   pyridOXINE (VITAMIN B-6) 50 MG tablet Take 50 mg by mouth daily.   sertraline (ZOLOFT) 100 MG tablet TAKE 1 TABLET BY MOUTH EVERY DAY (Patient taking differently: Take 100 mg by mouth every morning.)   spironolactone (ALDACTONE) 100 MG tablet TAKE 1 TABLET BY MOUTH EVERY DAY (Patient taking differently: Take 100 mg by mouth every morning.)   traMADol (ULTRAM) 50 MG tablet TAKE 1 TABLET BY MOUTH  EVERY 8 HOURS AS NEEDED.   zinc gluconate 50 MG tablet Take 50 mg by mouth daily.   [DISCONTINUED] oxyCODONE (OXY IR/ROXICODONE) 5 MG immediate release tablet Take 1 tablet (5 mg total) by mouth every 6 (six) hours as needed for severe pain.   potassium chloride SA (KLOR-CON M) 20 MEQ tablet Take 2 tablets (40 mEq) today, then take 1 tablet (20 mEq) daily until surgery. Be sure to take dose on day of procedure. Follow up with PCP for repeat labs. (Patient not taking: Reported on 04/22/2023)   No facility-administered medications prior to visit.   Review of Systems     Objective    BP 116/69 (BP Location: Left Arm, Patient Position: Sitting, Cuff Size: Normal)   Pulse 62   Temp 98.4 F (36.9 C) (Oral)   Ht 5\' 5"  (1.651 m)   Wt 148 lb (67.1 kg)   SpO2 100%   BMI 24.63 kg/m   Physical Exam    General: Appearance:    Well developed, well nourished male in no acute distress.Awake, alert, oriented x 3. No apparent focal neurological defect.   Eyes:    PERRL, conjunctiva/corneas clear, EOM's intact       Lungs:     Clear to auscultation bilaterally, respirations unlabored  Heart:    Normal heart rate. Normal rhythm. No murmurs, rubs, or gallops.    MS:   All extremities are intact.  No edema  Abd/GU:   Mild ascites of abdomen. Baseball sized swelling of scrotum c/with ascites. No erythema. No testicular masses or unusual tenderness.         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 No signs of infection. No indication of CHF.  Currently on Spironolactone 100mg  and Furosemide 20mg  TID. Blood pressure slightly low. -Check potassium levels due to potential imbalance from diuretic use. -Consider adjustment of diuretic therapy based on potassium results.    Continues to have some post operative pain.  -Refill Oxycodone prescription for pain  management.  -Follow up with surgeon on 04/24/2023 as already schedule    No follow-ups on file.      Mila Merry, MD  [facility] [phone] (phone) [phone] (fax)  [facility]

## 2023-04-24 ENCOUNTER — Telehealth: Payer: Self-pay | Admitting: Family Medicine

## 2023-04-24 ENCOUNTER — Encounter: Payer: Self-pay | Admitting: Physician Assistant

## 2023-04-24 ENCOUNTER — Ambulatory Visit: Payer: HMO | Admitting: Physician Assistant

## 2023-04-24 VITALS — BP 120/75 | HR 58 | Temp 98.1°F | Ht 65.0 in | Wt 142.8 lb

## 2023-04-24 DIAGNOSIS — K429 Umbilical hernia without obstruction or gangrene: Secondary | ICD-10-CM

## 2023-04-24 DIAGNOSIS — Z09 Encounter for follow-up examination after completed treatment for conditions other than malignant neoplasm: Secondary | ICD-10-CM | POA: Diagnosis not present

## 2023-04-24 DIAGNOSIS — K432 Incisional hernia without obstruction or gangrene: Secondary | ICD-10-CM

## 2023-04-24 NOTE — Progress Notes (Signed)
[facility] POST-OP OFFICE VISIT  04/24/2023  HPI: [name] is a 72 y.o. male 14 days s/p robotics assisted anterior abdominal hernia repair with Dr Claudine Mouton   From a surgical perspective, he is doing well. He reports abdominal soreness; worse at hernia repair site and incisions This is improving No fever, chills, nausea, emesis, or bowel changes He is ambulating better Edema is improving with change in his diuretics  Incisions well healed   He did see his PCP on 10/11 regarding his swelling. He has a history of cirrhosis. Diuretics were adjusted. Labs were drawn which I have reviewed: WBC 4.9K, Hgb to 11.6, sCr - 0.62, K+ 5.3  Vital signs: BP 120/75 (BP Location: Left Arm, Patient Position: Sitting, Cuff Size: Small)   Pulse (!) 58   Temp 98.1 F (36.7 C) (Oral)   Ht 5\' 5"  (1.651 m)   Wt 142 lb 12.8 oz (64.8 kg)   SpO2 97%   BMI 23.76 kg/m    Physical Exam: Constitutional: Well appearing male, NAD Abdomen: Soft, mild incisional soreness , non-distended, no rebound/guarding Skin: Laparoscopic incisions are healing well, there is scant amount of edema here, no erythema or drainage   Assessment/Plan: This is a 72 y.o. male 14 days s/p robotics assisted anterior abdominal hernia repair with Dr Claudine Mouton    - Continue diuretics as adjusted by PCP   - Pain control prn  - Reviewed wound care recommendation  - Reviewed lifting restrictions; 6 weeks total  - He can follow up with surgery on as needed basis; He understands to call with questions/concerns  Face-to-face time spent with the patient and care providers was 20 minutes, with more than 50% of the time spent counseling, educating, and coordinating care of the patient.     -- Lynden Oxford, PA-C [facility] 04/24/2023, 3:16 PM M-F: 7am - 4pm

## 2023-04-24 NOTE — Telephone Encounter (Signed)
Patient wants someone to call him regarding how he is to take his diuretics since Dr. Sherrie Mustache changed his dosing.

## 2023-04-24 NOTE — Patient Instructions (Signed)

## 2023-04-25 NOTE — Telephone Encounter (Signed)
Patient advised verbalized understanding

## 2023-04-30 ENCOUNTER — Other Ambulatory Visit: Payer: Self-pay | Admitting: Family Medicine

## 2023-05-01 ENCOUNTER — Ambulatory Visit: Payer: HMO | Admitting: Family Medicine

## 2023-05-01 NOTE — Telephone Encounter (Signed)
Unable to refill per protocol, Rx expired. Discontinued, dose change.  Requested Prescriptions  Pending Prescriptions Disp Refills   spironolactone (ALDACTONE) 25 MG tablet [Pharmacy Med Name: SPIRONOLACTONE 25 MG TABLET] 30 tablet 1    Sig: TAKE 1 TABLET (25 MG TOTAL) BY MOUTH DAILY. MUST SCHEDULE OFFICE VISIT     Cardiovascular: Diuretics - Aldosterone Antagonist Failed - 04/30/2023  1:39 AM      Failed - Cr in normal range and within 180 days    Creat  Date Value Ref Range Status  06/10/2017 0.81 0.70 - 1.25 mg/dL Final    Comment:    For patients >28 years of age, the reference limit for Creatinine is approximately 13% higher for people identified as African-American. .    Creatinine, Ser  Date Value Ref Range Status  04/19/2023 0.62 (L) 0.76 - 1.27 mg/dL Final         Failed - K in normal range and within 180 days    Potassium  Date Value Ref Range Status  04/19/2023 5.3 (H) 3.5 - 5.2 mmol/L Final  04/27/2014 4.5 3.5 - 5.1 mmol/L Final         Failed - Na in normal range and within 180 days    Sodium  Date Value Ref Range Status  04/19/2023 133 (L) 134 - 144 mmol/L Final  04/27/2014 136 136 - 145 mmol/L Final         Passed - eGFR is 30 or above and within 180 days    GFR, Est African American  Date Value Ref Range Status  06/10/2017 107 > OR = 60 mL/min/1.36m2 Final   GFR calc Af Amer  Date Value Ref Range Status  03/03/2020 111 >59 mL/min/1.73 Final    Comment:    **[facility] currently reports eGFR in compliance with the current**   recommendations of the [employer]. [facility] will   update reporting as new guidelines are published from the NKF-ASN   Task force.    GFR, Est Non African American  Date Value Ref Range Status  06/10/2017 93 > OR = 60 mL/min/1.77m2 Final   GFR, Estimated  Date Value Ref Range Status  04/05/2023 >60 >60 mL/min Final    Comment:    (NOTE) Calculated using the CKD-EPI Creatinine Equation (2021)    eGFR   Date Value Ref Range Status  04/19/2023 102 >59 mL/min/1.73 Final         Passed - Last BP in normal range    BP Readings from Last 1 Encounters:  04/24/23 120/75         Passed - Valid encounter within last 6 months    Recent Outpatient Visits           1 week ago Alcoholic cirrhosis of liver with ascites (HCC)   [place] [facility] Malva Limes, MD   1 month ago Ventral hernia without obstruction or gangrene   [place] [facility] Merita Norton T, FNP   2 months ago Alcoholic cirrhosis of liver with ascites [facility])   [place] [facility] Malva Limes, MD   2 months ago Ascites due to alcoholic cirrhosis [facility])   [place] [facility] Malva Limes, MD   4 months ago Type 2 diabetes mellitus with hyperglycemia, with long-term current use of insulin [facility])   [place] [facility] Malva Limes, MD       Future Appointments  Today Fisher, Demetrios Isaacs, MD [facility]   In 1 week Sherrie Mustache, Demetrios Isaacs, MD [facility], [facility]   In 1 month Deirdre Evener, MD [place]             spironolactone (ALDACTONE) 50 MG tablet [Pharmacy Med Name: SPIRONOLACTONE 50 MG TABLET] 30 tablet 1    Sig: TAKE 1 TABLET BY MOUTH EVERY DAY     Cardiovascular: Diuretics - Aldosterone Antagonist Failed - 04/30/2023  1:39 AM      Failed - Cr in normal range and within 180 days    Creat  Date Value Ref Range Status  06/10/2017 0.81 0.70 - 1.25 mg/dL Final    Comment:    For patients >63 years of age, the reference limit for Creatinine is approximately 13% higher for people identified as African-American. .    Creatinine, Ser  Date Value Ref Range Status  04/19/2023 0.62 (L) 0.76 - 1.27 mg/dL Final         Failed - K in normal range and within 180 days    Potassium  Date Value Ref Range Status   04/19/2023 5.3 (H) 3.5 - 5.2 mmol/L Final  04/27/2014 4.5 3.5 - 5.1 mmol/L Final         Failed - Na in normal range and within 180 days    Sodium  Date Value Ref Range Status  04/19/2023 133 (L) 134 - 144 mmol/L Final  04/27/2014 136 136 - 145 mmol/L Final         Passed - eGFR is 30 or above and within 180 days    GFR, Est African American  Date Value Ref Range Status  06/10/2017 107 > OR = 60 mL/min/1.70m2 Final   GFR calc Af Amer  Date Value Ref Range Status  03/03/2020 111 >59 mL/min/1.73 Final    Comment:    **[facility] currently reports eGFR in compliance with the current**   recommendations of the [employer]. [facility] will   update reporting as new guidelines are published from the NKF-ASN   Task force.    GFR, Est Non African American  Date Value Ref Range Status  06/10/2017 93 > OR = 60 mL/min/1.34m2 Final   GFR, Estimated  Date Value Ref Range Status  04/05/2023 >60 >60 mL/min Final    Comment:    (NOTE) Calculated using the CKD-EPI Creatinine Equation (2021)    eGFR  Date Value Ref Range Status  04/19/2023 102 >59 mL/min/1.73 Final         Passed - Last BP in normal range    BP Readings from Last 1 Encounters:  04/24/23 120/75         Passed - Valid encounter within last 6 months    Recent Outpatient Visits           1 week ago Alcoholic cirrhosis of liver with ascites (HCC)   [place] Malva Limes, MD   1 month ago Ventral hernia without obstruction or gangrene   [place] [facility] Merita Norton T, FNP   2 months ago Alcoholic cirrhosis of liver with ascites [facility])   [place] [facility] Malva Limes, MD   2 months ago Ascites due to alcoholic cirrhosis [facility])   [place] [facility] Malva Limes, MD   4 months ago Type 2 diabetes mellitus with hyperglycemia, with long-term current use of insulin (  [facility]   [place] [facility] Sherrie Mustache, Demetrios Isaacs, MD       Future Appointments             Today Sherrie Mustache, Demetrios Isaacs, MD [facility], [facility]   In 1 week Sherrie Mustache, Demetrios Isaacs, MD [facility], [facility]   In 1 month Deirdre Evener, MD [facility]

## 2023-05-10 ENCOUNTER — Encounter: Payer: Self-pay | Admitting: Family Medicine

## 2023-05-10 ENCOUNTER — Ambulatory Visit (INDEPENDENT_AMBULATORY_CARE_PROVIDER_SITE_OTHER): Payer: HMO | Admitting: Family Medicine

## 2023-05-10 VITALS — BP 106/68 | HR 57 | Ht 65.0 in | Wt 138.4 lb

## 2023-05-10 DIAGNOSIS — E1165 Type 2 diabetes mellitus with hyperglycemia: Secondary | ICD-10-CM

## 2023-05-10 DIAGNOSIS — E875 Hyperkalemia: Secondary | ICD-10-CM | POA: Diagnosis not present

## 2023-05-10 DIAGNOSIS — E559 Vitamin D deficiency, unspecified: Secondary | ICD-10-CM

## 2023-05-10 DIAGNOSIS — Z794 Long term (current) use of insulin: Secondary | ICD-10-CM | POA: Diagnosis not present

## 2023-05-10 DIAGNOSIS — K7031 Alcoholic cirrhosis of liver with ascites: Secondary | ICD-10-CM

## 2023-05-10 NOTE — Patient Instructions (Signed)
Please review the attached list of medications and notify my office if there are any errors.   I recommend that you get the RSV vaccine (Arexvy) to protect yourself from certain types of viral pneumonia. This vaccine is typically covered under Medicare prescription plans and should be covered if you get it at your pharmacy.

## 2023-05-10 NOTE — Progress Notes (Unsigned)
Established patient visit   Patient: [name]   DOB: 1951/06/20   72 y.o. Male  MRN: [id number] Visit Date: 05/10/2023  Today's healthcare provider: Mila Merry, MD   Chief Complaint  Patient presents with   Medical Management of Chronic Issues    3 week follow-up.    Subjective    Discussed the use of AI scribe software for clinical note transcription with the patient, who gave verbal consent to proceed.  History of Present Illness   The patient presents for follow up cirrhosis with ascites, also reports concerns about memory loss, stating he is "having trouble remembering things." He reports frequent episodes of forgetting recent conversations and why he entered a room. He requests testing for dementia or Alzheimer's disease.  His potassium was somewhat elevated at his last visit in October at which time furosemide was increased to 3 x 20mg  every day. The patient has been abstinent from alcohol for over a year and is actively trying to improve his health. He also mentions past issues with the congregation due to heavy drinking. Additionally, the patient reports a slow but steady weight loss, currently weighing around 130 pounds. He notes a significant overnight weight loss, attributing it to frequent urination during the night. He expresses concern about this weight loss, but also acknowledges recent changes to his fluid pills.     Lab Results  Component Value Date   NA 133 (L) 04/19/2023   K 5.3 (H) 04/19/2023   CREATININE 0.62 (L) 04/19/2023   EGFR 102 04/19/2023   GLUCOSE 251 (H) 04/19/2023    Lab Results  Component Value Date   HGBA1C 6.6 (H) 03/13/2023     Medications: Outpatient Medications Prior to Visit  Medication Sig   Accu-Chek Softclix Lancets lancets Use to check glucose ac and as needed   acetaminophen (TYLENOL) 500 MG tablet Take 1,000 mg by mouth every 6 (six) hours as needed.   allopurinol (ZYLOPRIM) 100 MG tablet TAKE 1 TABLET BY MOUTH EVERY  DAY (Patient taking differently: Take 100 mg by mouth every morning.)   B-D UF III MINI PEN NEEDLES 31G X 5 MM MISC USE 1 TO CHECK BLOOD SUGAR 3 TIMES DAILY   carvedilol (COREG) 3.125 MG tablet TAKE 1 TABLET BY MOUTH 2 TIMES DAILY.   Continuous Blood Gluc Receiver (FREESTYLE LIBRE 14 DAY READER) DEVI 1 Device by Does not apply route every 14 (fourteen) days. for type insulin dependent type 2 diabetes   Continuous Blood Gluc Sensor (FREESTYLE LIBRE 14 DAY SENSOR) MISC Place 1 Device onto the skin every 14 (fourteen) days. for insulin dependent type 2 diabetes   furosemide (LASIX) 20 MG tablet TAKE 3 TABLETS BY MOUTH EVERY DAY (Patient taking differently: Take 60 mg by mouth every morning.)   insulin degludec (TRESIBA FLEXTOUCH) 100 UNIT/ML FlexTouch Pen INJECT UP TO 20 UNITS UNDER THE SKIN EVERY DAY AT BEDTIME. NEED APPT FOR REFILLS. (Patient taking differently: 10-20 Units at bedtime.)   magnesium gluconate (MAGONATE) 500 MG tablet Take 500 mg by mouth daily.    Multiple Vitamin (MULTIVITAMIN WITH MINERALS) TABS tablet Take 1 tablet by mouth daily.   niacin 500 MG tablet Take 500 mg by mouth at bedtime.   NOVOLOG FLEXPEN 100 UNIT/ML FlexPen INJECT 20 UNITS UP TO THREE TIMES A DAY BEFORE MEALS. (Patient taking differently: 5-20 Units 3 (three) times daily with meals. Inject 20 units up to three times a day before meals. SS)   omeprazole (PRILOSEC) 40  MG capsule TAKE 1 CAPSULE BY MOUTH EVERY DAY (Patient taking differently: 40 mg every morning. TAKE 1 CAPSULE BY MOUTH EVERY DAY)   ONETOUCH VERIO test strip USE TO CHECK SUGAR THREE TIMES DAILY FOR INSULIN DEPENDENT TYPE 2 DIABETES   oxyCODONE (OXY IR/ROXICODONE) 5 MG immediate release tablet Take 1 tablet (5 mg total) by mouth every 6 (six) hours as needed for severe pain.   promethazine (PHENERGAN) 25 MG tablet TAKE 1 TABLET BY MOUTH EVERY 8 HOURS AS NEEDED FOR NAUSEA OR VOMITING.   pyridOXINE (VITAMIN B-6) 50 MG tablet Take 50 mg by mouth daily.    sertraline (ZOLOFT) 100 MG tablet TAKE 1 TABLET BY MOUTH EVERY DAY (Patient taking differently: Take 100 mg by mouth every morning.)   spironolactone (ALDACTONE) 100 MG tablet TAKE 1 TABLET BY MOUTH EVERY DAY (Patient taking differently: Take 100 mg by mouth every morning.)   traMADol (ULTRAM) 50 MG tablet TAKE 1 TABLET BY MOUTH EVERY 8 HOURS AS NEEDED.   zinc gluconate 50 MG tablet Take 50 mg by mouth daily.   naltrexone (DEPADE) 50 MG tablet TAKE 1 TABLET BY MOUTH EVERY DAY (Patient not taking: Reported on 05/10/2023)   No facility-administered medications prior to visit.   Review of Systems  Constitutional:  Negative for appetite change, chills and fever.  Respiratory:  Negative for chest tightness, shortness of breath and wheezing.   Cardiovascular:  Negative for chest pain and palpitations.  Gastrointestinal:  Negative for abdominal pain, nausea and vomiting.   {Insert previous labs (optional):23779} {See past labs  Heme  Chem  Endocrine  Serology  Results Review (optional):1}   Objective    BP 106/68 (BP Location: Left Arm, Patient Position: Sitting, Cuff Size: Normal)   Pulse (!) 57   Ht 5\' 5"  (1.651 m)   Wt 138 lb 6.4 oz (62.8 kg)   SpO2 100%   BMI 23.03 kg/m {Insert last BP/Wt (optional):23777}{See vitals history (optional):1}  Physical Exam  General appearance: Well developed, well nourished male, cooperative and in no acute distress Head: Normocephalic, without obvious abnormality, atraumatic Respiratory: Respirations even and unlabored, normal respiratory rate Abd: Mildlly distended, significantly improved from last visit.  Psych: Appropriate mood and affect. Neurologic: Mental status: Alert, oriented to person, place, and time, thought content appropriate.   6CIT=0  Assessment & Plan        Memory Concerns Patient reports frequent forgetfulness, including difficulty recalling recent conversations and reasons for entering rooms. Cognitive screening was  performed during the visit and was within normal limits. Discussed potential causes of memory issues, including vitamin deficiencies and elevated ammonia levels related to liver disease. -Order labs to check vitamin B1, B6, B12, D, and ammonia levels. -Continue multivitamin and B6 supplementation.  Ascites Improvement in abdominal distension with current diuretic regimen (Furosemide 3 tablets daily, Aldactone 100mg  daily). Weight loss is likely due to fluid loss. -Continue current diuretic regimen. -Check electrolytes and kidney function to ensure diuretics are not causing harm.  Diabetes Patient reports taking Tresiba (14 units nightly) and Novolog (sliding scale). Recent fasting blood glucose was 74. -Continue current insulin regimen.  General Health Maintenance Patient inquired about RSV vaccine. -Recommend RSV vaccine, which [facility] be obtained [facility].  Follow-up Plan to review lab results and schedule follow-up appointment accordingly.    No follow-ups on file.      Mila Merry, MD  [facility] [phone] (phone) [phone] (fax)  [facility]

## 2023-05-13 ENCOUNTER — Ambulatory Visit (INDEPENDENT_AMBULATORY_CARE_PROVIDER_SITE_OTHER): Payer: HMO | Admitting: Family Medicine

## 2023-05-13 VITALS — BP 105/70 | HR 63 | Temp 98.4°F | Resp 16 | Ht 65.0 in | Wt 139.0 lb

## 2023-05-13 DIAGNOSIS — N6321 Unspecified lump in the left breast, upper outer quadrant: Secondary | ICD-10-CM

## 2023-05-13 DIAGNOSIS — K7031 Alcoholic cirrhosis of liver with ascites: Secondary | ICD-10-CM | POA: Diagnosis not present

## 2023-05-13 MED ORDER — FUROSEMIDE 20 MG PO TABS: 40.0000 mg | ORAL_TABLET | Freq: Two times a day (BID) | ORAL | 1 refills | Status: DC

## 2023-05-13 NOTE — Progress Notes (Unsigned)
Established patient visit   Patient: [name]   DOB: 04-22-1951   72 y.o. Male  MRN: [id number] Visit Date: 05/13/2023  Today's healthcare provider: Mila Merry, MD   Chief Complaint  Patient presents with   Breast Problem    Patient reports lump on left breast that is sore to the touch X    Subjective    Discussed the use of AI scribe software for clinical note transcription with the patient, who gave verbal consent to proceed.  History of Present Illness   The patient presented with a newly discovered lump, described as a "nice hard lump," located in left lateral breast. The lump was reported to be sore to the touch and around the area.  The patient also reported results from recent blood work, which indicated slightly elevated potassium levels. He was taking furosemide, a diuretic, three times a day in addition to spironolactone for ascites.       Medications: Outpatient Medications Prior to Visit  Medication Sig   Accu-Chek Softclix Lancets lancets Use to check glucose ac and as needed   acetaminophen (TYLENOL) 500 MG tablet Take 1,000 mg by mouth every 6 (six) hours as needed.   allopurinol (ZYLOPRIM) 100 MG tablet TAKE 1 TABLET BY MOUTH EVERY DAY (Patient taking differently: Take 100 mg by mouth every morning.)   B-D UF III MINI PEN NEEDLES 31G X 5 MM MISC USE 1 TO CHECK BLOOD SUGAR 3 TIMES DAILY   carvedilol (COREG) 3.125 MG tablet TAKE 1 TABLET BY MOUTH 2 TIMES DAILY.   Continuous Blood Gluc Receiver (FREESTYLE LIBRE 14 DAY READER) DEVI 1 Device by Does not apply route every 14 (fourteen) days. for type insulin dependent type 2 diabetes   Continuous Blood Gluc Sensor (FREESTYLE LIBRE 14 DAY SENSOR) MISC Place 1 Device onto the skin every 14 (fourteen) days. for insulin dependent type 2 diabetes   insulin degludec (TRESIBA FLEXTOUCH) 100 UNIT/ML FlexTouch Pen INJECT UP TO 20 UNITS UNDER THE SKIN EVERY DAY AT BEDTIME. NEED APPT FOR REFILLS. (Patient taking  differently: 10-20 Units at bedtime.)   magnesium gluconate (MAGONATE) 500 MG tablet Take 500 mg by mouth daily.    Multiple Vitamin (MULTIVITAMIN WITH MINERALS) TABS tablet Take 1 tablet by mouth daily.   niacin 500 MG tablet Take 500 mg by mouth at bedtime.   NOVOLOG FLEXPEN 100 UNIT/ML FlexPen INJECT 20 UNITS UP TO THREE TIMES A DAY BEFORE MEALS. (Patient taking differently: 5-20 Units 3 (three) times daily with meals. Inject 20 units up to three times a day before meals. SS)   omeprazole (PRILOSEC) 40 MG capsule TAKE 1 CAPSULE BY MOUTH EVERY DAY (Patient taking differently: 40 mg every morning. TAKE 1 CAPSULE BY MOUTH EVERY DAY)   ONETOUCH VERIO test strip USE TO CHECK SUGAR THREE TIMES DAILY FOR INSULIN DEPENDENT TYPE 2 DIABETES   oxyCODONE (OXY IR/ROXICODONE) 5 MG immediate release tablet Take 1 tablet (5 mg total) by mouth every 6 (six) hours as needed for severe pain.   promethazine (PHENERGAN) 25 MG tablet TAKE 1 TABLET BY MOUTH EVERY 8 HOURS AS NEEDED FOR NAUSEA OR VOMITING.   pyridOXINE (VITAMIN B-6) 50 MG tablet Take 50 mg by mouth daily.   sertraline (ZOLOFT) 100 MG tablet TAKE 1 TABLET BY MOUTH EVERY DAY (Patient taking differently: Take 100 mg by mouth every morning.)   spironolactone (ALDACTONE) 100 MG tablet TAKE 1 TABLET BY MOUTH EVERY DAY (Patient taking differently: Take 100 mg by  mouth every morning.)   traMADol (ULTRAM) 50 MG tablet TAKE 1 TABLET BY MOUTH EVERY 8 HOURS AS NEEDED.   zinc gluconate 50 MG tablet Take 50 mg by mouth daily.   [DISCONTINUED] furosemide (LASIX) 20 MG tablet TAKE 3 TABLETS BY MOUTH EVERY DAY (Patient taking differently: Take 60 mg by mouth every morning.)   No facility-administered medications prior to visit.   Review of Systems {Insert previous labs (optional):23779} {See past labs  Heme  Chem  Endocrine  Serology  Results Review (optional):1}   Objective    BP 105/70 (BP Location: Right Arm, Patient Position: Sitting, Cuff Size: Normal)    Pulse 63   Temp 98.4 F (36.9 C)   Resp 16   Ht 5\' 5"  (1.651 m)   Wt 139 lb (63 kg)   SpO2 99%   BMI 23.13 kg/m {Insert last BP/Wt (optional):23777}{See vitals history (optional):1}  Physical Exam  Physical Exam   GENITOURINARY: Palpable hard lump on one side, tender to touch. Contralateral side without palpable abnormalities.    No results found for any visits on 05/13/23.  Assessment & Plan        Breast Mass New palpable mass left lateral breast, minimally tender to touch. No discharge or trauma reported. -Order breast ultrasound and MM for further evaluation.  Ascites Elevated potassium levels  -Discontinue potassium supplementation. -Increase Furosemide to 4 pills daily (2 in the morning, 2 in the evening) to help lower potassium levels. -[facility] [place]    No follow-ups on file.      Mila Merry, MD  [facility] [phone] (phone) [phone] (fax)  [facility]

## 2023-05-16 LAB — RENAL FUNCTION PANEL
Albumin: 3.6 g/dL — ABNORMAL LOW (ref 3.8–4.8)
BUN/Creatinine Ratio: 21 (ref 10–24)
BUN: 16 mg/dL (ref 8–27)
CO2: 27 mmol/L (ref 20–29)
Calcium: 8.9 mg/dL (ref 8.6–10.2)
Chloride: 90 mmol/L — ABNORMAL LOW (ref 96–106)
Creatinine, Ser: 0.78 mg/dL (ref 0.76–1.27)
Glucose: 312 mg/dL — ABNORMAL HIGH (ref 70–99)
Phosphorus: 3.5 mg/dL (ref 2.8–4.1)
Potassium: 5.3 mmol/L — ABNORMAL HIGH (ref 3.5–5.2)
Sodium: 127 mmol/L — ABNORMAL LOW (ref 134–144)
eGFR: 95 mL/min/{1.73_m2} (ref >59)

## 2023-05-16 LAB — VITAMIN D 25 HYDROXY (VIT D DEFICIENCY, FRACTURES): Vit D, 25-Hydroxy: 24 ng/mL — ABNORMAL LOW (ref 30.0–100.0)

## 2023-05-16 LAB — VITAMIN B1: Thiamine: 143 nmol/L (ref 66.5–200.0)

## 2023-05-16 LAB — AMMONIA: Ammonia: 41 ug/dL (ref 31–169)

## 2023-05-21 ENCOUNTER — Ambulatory Visit
Admission: RE | Admit: 2023-05-21 | Discharge: 2023-05-21 | Disposition: A | Discharge status: Home / Self Care | Payer: PPO | Source: Ambulatory Visit | Attending: Family Medicine | Admitting: Family Medicine

## 2023-05-21 DIAGNOSIS — N62 Hypertrophy of breast: Secondary | ICD-10-CM | POA: Diagnosis not present

## 2023-05-21 DIAGNOSIS — N6342 Unspecified lump in left breast, subareolar: Secondary | ICD-10-CM | POA: Diagnosis not present

## 2023-05-21 DIAGNOSIS — N6321 Unspecified lump in the left breast, upper outer quadrant: Secondary | ICD-10-CM | POA: Insufficient documentation

## 2023-05-21 DIAGNOSIS — R92323 Mammographic fibroglandular density, bilateral breasts: Secondary | ICD-10-CM | POA: Diagnosis not present

## 2023-05-27 ENCOUNTER — Other Ambulatory Visit: Payer: Self-pay

## 2023-05-27 MED ORDER — EPLERENONE 25 MG PO TABS: 25.0000 mg | ORAL_TABLET | Freq: Two times a day (BID) | ORAL | 0 refills | Status: DC

## 2023-05-30 ENCOUNTER — Encounter: Payer: Self-pay | Admitting: Dermatology

## 2023-05-30 ENCOUNTER — Ambulatory Visit: Payer: PPO | Admitting: Dermatology

## 2023-05-30 DIAGNOSIS — L821 Other seborrheic keratosis: Secondary | ICD-10-CM

## 2023-05-30 DIAGNOSIS — D692 Other nonthrombocytopenic purpura: Secondary | ICD-10-CM | POA: Diagnosis not present

## 2023-05-30 DIAGNOSIS — L578 Other skin changes due to chronic exposure to nonionizing radiation: Secondary | ICD-10-CM

## 2023-05-30 NOTE — Patient Instructions (Signed)

## 2023-05-30 NOTE — Progress Notes (Signed)
   Follow-Up Visit   Subjective  [name] is a 72 y.o. male who presents for the following: check spot L cheek, noticed 3 wks ago, getting larger, tender to touch The patient has spots, moles and lesions to be evaluated, some may be new or changing and the patient may have concern these could be cancer.   The following portions of the chart were reviewed this encounter and updated as appropriate: medications, allergies, medical history  Review of Systems:  No other skin or systemic complaints except as noted in HPI or Assessment and Plan.  Objective  Well appearing patient in no apparent distress; mood and affect are within normal limits.   A focused examination was performed of the following areas: Face, hands, arms  Relevant exam findings are noted in the Assessment and Plan.    Assessment & Plan   SEBORRHEIC KERATOSIS L cheek - Stuck-on, waxy, tan-brown papules and/or plaques  - Benign-appearing - Discussed benign etiology and prognosis. - Observe - Call for any changes  Actinic elastosis/Purpura - Chronic; persistent and recurrent.  Treatable, but not curable. Arms, hands - Violaceous macules and patches - Benign - Related to trauma, age, sun damage and/or use of blood thinners, chronic use of topical and/or oral steroids - Observe - Call for worsening or other concerns    Return if symptoms worsen or fail to improve.  I, Ardis Rowan, RMA, am acting as scribe for Elie Goody, MD .   Documentation: I have reviewed the above documentation for accuracy and completeness, and I agree with the above.  Elie Goody, MD

## 2023-06-11 ENCOUNTER — Other Ambulatory Visit: Payer: Self-pay | Admitting: Family Medicine

## 2023-06-11 DIAGNOSIS — Z794 Long term (current) use of insulin: Secondary | ICD-10-CM

## 2023-06-13 NOTE — Telephone Encounter (Signed)
Requested medications are due for refill today.  Unsure  Requested medications are on the active medications list.  yes  Last refill. 11/27/2022   Future visit scheduled.   yes  Notes to clinic.  Sig states that pt is to use this Rx(needles)to check blood sugar. Please review.    Requested Prescriptions  Pending Prescriptions Disp Refills   B-D UF III MINI PEN NEEDLES 31G X 5 MM MISC [Pharmacy Med Name: BD UF MINI PEN NEEDLE [id number]] 100 each 4    Sig: USE 1 TO CHECK BLOOD SUGAR 3 TIMES DAILY     Endocrinology: Diabetes - Testing Supplies Passed - 06/11/2023  2:50 PM      Passed - Valid encounter within last 12 months    Recent Outpatient Visits           1 month ago Mass of upper outer quadrant of left breast   [place] [facility] Malva Limes, MD   1 month ago Alcoholic cirrhosis of liver with ascites [facility])   [place] [facility] Malva Limes, MD   1 month ago Alcoholic cirrhosis of liver with ascites [facility])   [place] [facility] Malva Limes, MD   3 months ago Ventral hernia without obstruction or gangrene   [place] [facility] Merita Norton T, FNP   3 months ago Alcoholic cirrhosis of liver with ascites [facility])   [place] [facility] Malva Limes, MD       Future Appointments             In 4 days Fisher, Demetrios Isaacs, MD [facility], [facility]   In 1 week Deirdre Evener, MD [facility]

## 2023-06-17 ENCOUNTER — Encounter: Payer: Self-pay | Admitting: Family Medicine

## 2023-06-17 ENCOUNTER — Ambulatory Visit (INDEPENDENT_AMBULATORY_CARE_PROVIDER_SITE_OTHER): Payer: HMO | Admitting: Family Medicine

## 2023-06-17 VITALS — BP 119/73 | HR 60 | Ht 65.0 in | Wt 151.0 lb

## 2023-06-17 DIAGNOSIS — K7031 Alcoholic cirrhosis of liver with ascites: Secondary | ICD-10-CM | POA: Diagnosis not present

## 2023-06-17 DIAGNOSIS — N62 Hypertrophy of breast: Secondary | ICD-10-CM | POA: Diagnosis not present

## 2023-06-17 MED ORDER — EPLERENONE 25 MG PO TABS: 25.0000 mg | ORAL_TABLET | Freq: Two times a day (BID) | ORAL | 0 refills | Status: DC

## 2023-06-17 NOTE — Progress Notes (Signed)
Established patient visit   Patient: [name]   DOB: 21-Jan-1951   72 y.o. Male  MRN: [id number] Visit Date: 06/17/2023  Today's healthcare provider: Mila Merry, MD   Chief Complaint  Patient presents with   Medical Management of Chronic Issues    3 week follow-up    Subjective    HPI Follow up ascites and gynecomastia. Was supposed to have changed from spironolactone to eplerenone which was prescribed on 11-18, but he doesn't recall if he has made the medication change or not. Reports that ascites is getting worse, but breast tenderness has improved.   Medications: Outpatient Medications Prior to Visit  Medication Sig   Accu-Chek Softclix Lancets lancets Use to check glucose ac and as needed   acetaminophen (TYLENOL) 500 MG tablet Take 1,000 mg by mouth every 6 (six) hours as needed.   allopurinol (ZYLOPRIM) 100 MG tablet TAKE 1 TABLET BY MOUTH EVERY DAY (Patient taking differently: Take 100 mg by mouth every morning.)   B-D UF III MINI PEN NEEDLES 31G X 5 MM MISC USE 1 TO CHECK BLOOD SUGAR 3 TIMES DAILY   carvedilol (COREG) 3.125 MG tablet TAKE 1 TABLET BY MOUTH 2 TIMES DAILY.   Continuous Blood Gluc Receiver (FREESTYLE LIBRE 14 DAY READER) DEVI 1 Device by Does not apply route every 14 (fourteen) days. for type insulin dependent type 2 diabetes   Continuous Blood Gluc Sensor (FREESTYLE LIBRE 14 DAY SENSOR) MISC Place 1 Device onto the skin every 14 (fourteen) days. for insulin dependent type 2 diabetes   furosemide (LASIX) 20 MG tablet Take 2 tablets (40 mg total) by mouth 2 (two) times daily.   insulin degludec (TRESIBA FLEXTOUCH) 100 UNIT/ML FlexTouch Pen INJECT UP TO 20 UNITS UNDER THE SKIN EVERY DAY AT BEDTIME. NEED APPT FOR REFILLS. (Patient taking differently: 10-20 Units at bedtime.)   magnesium gluconate (MAGONATE) 500 MG tablet Take 500 mg by mouth daily.    Multiple Vitamin (MULTIVITAMIN WITH MINERALS) TABS tablet Take 1 tablet by mouth daily.   niacin 500 MG  tablet Take 500 mg by mouth at bedtime.   NOVOLOG FLEXPEN 100 UNIT/ML FlexPen INJECT 20 UNITS UP TO THREE TIMES A DAY BEFORE MEALS. (Patient taking differently: 5-20 Units 3 (three) times daily with meals. Inject 20 units up to three times a day before meals. SS)   omeprazole (PRILOSEC) 40 MG capsule TAKE 1 CAPSULE BY MOUTH EVERY DAY (Patient taking differently: 40 mg every morning. TAKE 1 CAPSULE BY MOUTH EVERY DAY)   ONETOUCH VERIO test strip USE TO CHECK SUGAR THREE TIMES DAILY FOR INSULIN DEPENDENT TYPE 2 DIABETES   oxyCODONE (OXY IR/ROXICODONE) 5 MG immediate release tablet Take 1 tablet (5 mg total) by mouth every 6 (six) hours as needed for severe pain.   promethazine (PHENERGAN) 25 MG tablet TAKE 1 TABLET BY MOUTH EVERY 8 HOURS AS NEEDED FOR NAUSEA OR VOMITING.   pyridOXINE (VITAMIN B-6) 50 MG tablet Take 50 mg by mouth daily.   sertraline (ZOLOFT) 100 MG tablet TAKE 1 TABLET BY MOUTH EVERY DAY (Patient taking differently: Take 100 mg by mouth every morning.)   traMADol (ULTRAM) 50 MG tablet TAKE 1 TABLET BY MOUTH EVERY 8 HOURS AS NEEDED.   zinc gluconate 50 MG tablet Take 50 mg by mouth daily.   [DISCONTINUED] eplerenone (INSPRA) 25 MG tablet Take 1 tablet (25 mg total) by mouth 2 (two) times daily.   No facility-administered medications prior to visit.  Objective    BP 119/73 (BP Location: Left Arm, Patient Position: Sitting, Cuff Size: Normal)   Pulse 60   Ht 5\' 5"  (1.651 m)   Wt 151 lb (68.5 kg)   SpO2 100%   BMI 25.13 kg/m    Physical Exam   General appearance: Well developed, well nourished male, cooperative and in no acute distress Head: Normocephalic, without obvious abnormality, atraumatic Respiratory: Respirations even and unlabored, normal respiratory rate Extremities: All extremities are intact.  Abd: Moderate non-tender ascites.  Psych: Appropriate mood and affect. Neurologic: Mental status: Alert, oriented to person, place, and time, thought content  appropriate.    Assessment & Plan     1. Alcoholic cirrhosis of liver with ascites [facility]) Last seen by GI April 2024.   2. Gynecomastia, male  Patient does not know if he has yet changed spironolactone to eplerenone. He is going to check his rxs at home and given printed printed prescription for eplerenone to fill if not. If he has started eplerenone we will check potassium before titrating up the dose.      Mila Merry, MD  [facility] [phone] (phone) [phone] (fax)  [facility]

## 2023-06-17 NOTE — Patient Instructions (Signed)
Please review the attached list of medications and notify my office if there are any errors.   Check to see if you have already changed from spironolactone to eplerenone. If not then take the prescription the [facility] and start taking. If you are already taking eplerenone, then let me know and we will send you to the labs to check your potassium before increasing the dose.

## 2023-06-18 ENCOUNTER — Telehealth: Payer: Self-pay

## 2023-06-18 NOTE — Telephone Encounter (Signed)
I assume that means he has not being taken it before today. If that is the case, then he needs to schedule follow up appointment in 3 weeks to assess how well it is working and to check his potassium levels.

## 2023-06-18 NOTE — Telephone Encounter (Signed)
Copied from CRM [id number]. Topic: General - Other >> Jun 18, 2023  9:27 AM Marlow Baars wrote: Reason for CRM: The patient called in stating he in fact does have the  eplerenone (INSPRA) 25 MG tablet and he is going to start taking it today.

## 2023-06-19 NOTE — Telephone Encounter (Signed)
Try calling again. Unable to get through.

## 2023-06-20 ENCOUNTER — Other Ambulatory Visit: Payer: Self-pay | Admitting: Family Medicine

## 2023-06-20 NOTE — Telephone Encounter (Signed)
Requested Prescriptions  Refused Prescriptions Disp Refills   eplerenone (INSPRA) 25 MG tablet [Pharmacy Med Name: EPLERENONE 25 MG TABLET] 60 tablet 0    Sig: TAKE 1 TABLET BY MOUTH TWICE A DAY     Cardiovascular: Diuretics - Aldosterone Antagonist Failed - 06/20/2023  5:50 PM      Failed - K in normal range and within 180 days    Potassium  Date Value Ref Range Status  05/10/2023 5.3 (H) 3.5 - 5.2 mmol/L Final  04/27/2014 4.5 3.5 - 5.1 mmol/L Final         Failed - Na in normal range and within 180 days    Sodium  Date Value Ref Range Status  05/10/2023 127 (L) 134 - 144 mmol/L Final  04/27/2014 136 136 - 145 mmol/L Final         Passed - Cr in normal range and within 180 days    Creat  Date Value Ref Range Status  06/10/2017 0.81 0.70 - 1.25 mg/dL Final    Comment:    For patients >75 years of age, the reference limit for Creatinine is approximately 13% higher for people identified as African-American. .    Creatinine, Ser  Date Value Ref Range Status  05/10/2023 0.78 0.76 - 1.27 mg/dL Final         Passed - eGFR is 30 or above and within 180 days    GFR, Est African American  Date Value Ref Range Status  06/10/2017 107 > OR = 60 mL/min/1.58m2 Final   GFR calc Af Amer  Date Value Ref Range Status  03/03/2020 111 >59 mL/min/1.73 Final    Comment:    **[facility] currently reports eGFR in compliance with the current**   recommendations of the [employer]. [facility] will   update reporting as new guidelines are published from the NKF-ASN   Task force.    GFR, Est Non African American  Date Value Ref Range Status  06/10/2017 93 > OR = 60 mL/min/1.43m2 Final   GFR, Estimated  Date Value Ref Range Status  04/05/2023 >60 >60 mL/min Final    Comment:    (NOTE) Calculated using the CKD-EPI Creatinine Equation (2021)    eGFR  Date Value Ref Range Status  05/10/2023 95 >59 mL/min/1.73 Final         Passed - Last BP in normal range    BP Readings  from Last 1 Encounters:  06/17/23 119/73         Passed - Valid encounter within last 6 months    Recent Outpatient Visits           3 days ago Alcoholic cirrhosis of liver with ascites (HCC)   [place] [facility] Malva Limes, MD   1 month ago Mass of upper outer quadrant of left breast   [place] [facility] Malva Limes, MD   1 month ago Alcoholic cirrhosis of liver with ascites [facility])   [facility] Malva Limes, MD   2 months ago Alcoholic cirrhosis of liver with ascites [facility])   [place] [facility] Malva Limes, MD   3 months ago Ventral hernia without obstruction or gangrene   [facility] Jacky Kindle, FNP       Future Appointments             In 6 days Deirdre Evener, MD [facility]

## 2023-06-24 NOTE — Telephone Encounter (Signed)
Tried calling patient. NA 

## 2023-06-26 ENCOUNTER — Ambulatory Visit: Payer: PPO | Admitting: Dermatology

## 2023-06-26 DIAGNOSIS — Z7189 Other specified counseling: Secondary | ICD-10-CM

## 2023-06-26 DIAGNOSIS — L570 Actinic keratosis: Secondary | ICD-10-CM

## 2023-06-26 DIAGNOSIS — W908XXA Exposure to other nonionizing radiation, initial encounter: Secondary | ICD-10-CM | POA: Diagnosis not present

## 2023-06-26 DIAGNOSIS — T07XXXA Unspecified multiple injuries, initial encounter: Secondary | ICD-10-CM

## 2023-06-26 DIAGNOSIS — S0001XA Abrasion of scalp, initial encounter: Secondary | ICD-10-CM

## 2023-06-26 DIAGNOSIS — L820 Inflamed seborrheic keratosis: Secondary | ICD-10-CM | POA: Diagnosis not present

## 2023-06-26 DIAGNOSIS — L578 Other skin changes due to chronic exposure to nonionizing radiation: Secondary | ICD-10-CM

## 2023-06-26 DIAGNOSIS — Z79899 Other long term (current) drug therapy: Secondary | ICD-10-CM

## 2023-06-26 MED ORDER — MUPIROCIN 2 % EX OINT: 1.0000 | TOPICAL_OINTMENT | Freq: Every day | CUTANEOUS | 2 refills | Status: DC

## 2023-06-26 NOTE — Progress Notes (Signed)
Follow-Up Visit   Subjective  [name] is a 72 y.o. male who presents for the following: AK face, scalp f/u, pt did not use 5FU/Calcipotirene cr The patient has spots, moles and lesions to be evaluated, some may be new or changing and the patient may have concern these could be cancer.   The following portions of the chart were reviewed this encounter and updated as appropriate: medications, allergies, medical history  Review of Systems:  No other skin or systemic complaints except as noted in HPI or Assessment and Plan.  Objective  Well appearing patient in no apparent distress; mood and affect are within normal limits.   A focused examination was performed of the following areas: Face, scalp, ears  Relevant exam findings are noted in the Assessment and Plan.  face, scalp x 20 (20) Pink scaly macules Scalp x 5 (5) Stuck on waxy paps with erythema  Assessment & Plan   ACTINIC DAMAGE WITH PRECANCEROUS ACTINIC KERATOSES Counseling for Topical Chemotherapy Management: Patient exhibits: - Severe, confluent actinic changes with pre-cancerous actinic keratoses that is secondary to cumulative UV radiation exposure over time - Condition that is severe; chronic, not at goal. - diffuse scaly erythematous macules and papules with underlying dyspigmentation - Discussed Prescription "Field Treatment" topical Chemotherapy for Severe, Chronic Confluent Actinic Changes with Pre-Cancerous Actinic Keratoses Field treatment involves treatment of an entire area of skin that has confluent Actinic Changes (Sun/ Ultraviolet light damage) and PreCancerous Actinic Keratoses by method of PhotoDynamic Therapy (PDT) and/or prescription Topical Chemotherapy agents such as 5-fluorouracil, 5-fluorouracil/calcipotriene, and/or imiquimod.  The purpose is to decrease the number of clinically evident and subclinical PreCancerous lesions to prevent progression to development of skin cancer by chemically  destroying early precancer changes that may or may not be visible.  It has been shown to reduce the risk of developing skin cancer in the treated area. As a result of treatment, redness, scaling, crusting, and open sores may occur during treatment course. One or more than one of these methods may be used and may have to be used several times to control, suppress and eliminate the PreCancerous changes. Discussed treatment course, expected reaction, and possible side effects. - Recommend daily broad spectrum sunscreen SPF 30+ to sun-exposed areas, reapply every 2 hours as needed.  - Staying in the shade or wearing long sleeves, sun glasses (UVA+UVB protection) and wide brim hats (4-inch brim around the entire circumference of the hat) are also recommended. - Call for new or changing lesions.  -Recommend Red Light treatment with debridement to face and scalp  AK (ACTINIC KERATOSIS) (20) face, scalp x 20 (20) ACTINIC DAMAGE WITH PRECANCEROUS ACTINIC KERATOSES Counseling for Topical Chemotherapy Management: Patient exhibits: - Severe, confluent actinic changes with pre-cancerous actinic keratoses that is secondary to cumulative UV radiation exposure over time - Condition that is severe; chronic, not at goal. - diffuse scaly erythematous macules and papules with underlying dyspigmentation - Discussed Prescription "Field Treatment" topical Chemotherapy for Severe, Chronic Confluent Actinic Changes with Pre-Cancerous Actinic Keratoses Field treatment involves treatment of an entire area of skin that has confluent Actinic Changes (Sun/ Ultraviolet light damage) and PreCancerous Actinic Keratoses by method of PhotoDynamic Therapy (PDT) and/or prescription Topical Chemotherapy agents such as 5-fluorouracil, 5-fluorouracil/calcipotriene, and/or imiquimod.  The purpose is to decrease the number of clinically evident and subclinical PreCancerous lesions to prevent progression to development of skin cancer by  chemically destroying early precancer changes that may or may not be visible.  It has  been shown to reduce the risk of developing skin cancer in the treated area. As a result of treatment, redness, scaling, crusting, and open sores may occur during treatment course. One or more than one of these methods may be used and may have to be used several times to control, suppress and eliminate the PreCancerous changes. Discussed treatment course, expected reaction, and possible side effects. - Recommend daily broad spectrum sunscreen SPF 30+ to sun-exposed areas, reapply every 2 hours as needed.  - Staying in the shade or wearing long sleeves, sun glasses (UVA+UVB protection) and wide brim hats (4-inch brim around the entire circumference of the hat) are also recommended. - Call for new or changing lesions.   EXCORIATIONS scalp Exam: excoriations to scalp  Treatment Plan: Start Mupirocin oint qd to scalp  Destruction of lesion - face, scalp x 20 (20) Complexity: simple   Destruction method: cryotherapy   Informed consent: discussed and consent obtained   Timeout:  patient name, date of birth, surgical site, and procedure verified Lesion destroyed using liquid nitrogen: Yes   Region frozen until ice ball extended beyond lesion: Yes   Outcome: patient tolerated procedure well with no complications   Post-procedure details: wound care instructions given   INFLAMED SEBORRHEIC KERATOSIS (5) Scalp x 5 (5) Symptomatic, irritating, patient would like treated. Destruction of lesion - Scalp x 5 (5) Complexity: simple   Destruction method: cryotherapy   Informed consent: discussed and consent obtained   Timeout:  patient name, date of birth, surgical site, and procedure verified Lesion destroyed using liquid nitrogen: Yes   Region frozen until ice ball extended beyond lesion: Yes   Outcome: patient tolerated procedure well with no complications   Post-procedure details: wound care instructions given     Return for Red light with debridement to scalp and face, 28m AK f/u.  I, Ardis Rowan, RMA, am acting as scribe for Armida Sans, MD .   Documentation: I have reviewed the above documentation for accuracy and completeness, and I agree with the above.  Armida Sans, MD

## 2023-06-26 NOTE — Patient Instructions (Addendum)
Photodynamic Therapy- "Blue or Red Light Therapy"  Actinic keratoses are the dry, red scaly spots on the skin caused by sun damage. A portion of these spots can turn into skin cancer with time, and treating them can help prevent development of skin cancer.   Treatment of these spots requires removal of the defective skin cells. There are various ways to remove actinic keratoses, including freezing with liquid nitrogen, treatment with creams, or treatment with a blue light procedure in the office.   Photodynamic Therapy (PDT), also known as "blue or red light therapy" is an in office procedure used to treat actinic keratoses. It works by targeting precancerous cells. After treatment, these cells peel off and are replaced by healthy ones.   For your phototherapy appointment, you will have two appointments on the day of your treatment. The first appointment will be to apply a cream to the treatment area. You will leave this cream on for 1-2 hours depending on the area being treated. The second appointment will be to shine a blue or red light on the area for 16-20 minutes to kill off the precancer cells. It is common to experience a burning sensation during the treatment.  After your treatment, it will be important to keep the treated areas of skin out of the sun completely for 48-72 hours (2-3 days) to prevent having a reaction.   Common side effects include: - Burning or stinging, which may be severe and can last up to 24-72 hours after your treatment - Scaling and crusting which may last up to 2 weeks - Redness, swelling and/or peeling which can last up to 4 weeks  To Care for Your Skin After PDT/Blue/Red Light Therapy: - Wash with soap, water and shampoo as normal. - If needed, you can use cold compresses (e.g. ice packs) for comfort - If okay with your primary care doctor, you may use analgesics such as acetaminophen (tylenol) every 4-6 hours, not to exceed recommended dose - You may apply  Cerave Healing Ointment, Vaseline or Aquaphor as needed - If you have a lot of swelling you may take a Benadryl to help with this (this may cause drowsiness), not to exceed recommended dose. This may increase the risk of falls in people over 65 and may slow reaction time while driving, so it is not recommended to take before driving or operating machinery. - Sun Precautions - Wear a wide brim hat for the next week if outside  - Wear a sunblock with zinc or titanium dioxide at least SPF 50 daily  If you have any questions or concerns, please call the office and ask to speak with a nurse.   --------------------------------------------------------------------------------------------------------------    Cryotherapy Aftercare  Wash gently with soap and water everyday.   Apply Vaseline and Band-Aid daily until healed. Due to recent changes in healthcare laws, you may see results of your pathology and/or laboratory studies on MyChart before the doctors have had a chance to review them. We understand that in some cases there may be results that are confusing or concerning to you. Please understand that not all results are received at the same time and often the doctors may need to interpret multiple results in order to provide you with the best plan of care or course of treatment. Therefore, we ask that you please give [place] 2 business days to thoroughly review all your results before contacting the office for clarification. Should we see a critical lab result, you will be contacted sooner.  If You Need Anything After Your Visit  If you have any questions or concerns for your doctor, please call our main line at [phone] and press option 4 to reach your doctor's medical assistant. If no one answers, please leave a voicemail as directed and we will return your call as soon as possible. Messages left after 4 pm will be answered the following business day.   You may also send [place] a message via MyChart. We  typically respond to MyChart messages within 1-2 business days.  For prescription refills, please ask your pharmacy to contact our office. Our fax number is [phone].  If you have an urgent issue when the clinic is closed that cannot wait until the next business day, you can page your doctor at the number below.    Please note that while we do our best to be available for urgent issues outside of office hours, we are not available 24/7.   If you have an urgent issue and are unable to reach [place], you may choose to seek medical care at your doctor's office, retail clinic, urgent care center, or emergency room.  If you have a medical emergency, please immediately call 911 or go to the emergency department.  Pager Numbers  - Dr. Gwen Pounds: [phone]  - Dr. Roseanne Reno: [phone]  - Dr. Katrinka Blazing: [phone]   In the event of inclement weather, please call our main line at [phone] for an update on the status of any delays or closures.  Dermatology Medication Tips: Please keep the boxes that topical medications come in in order to help keep track of the instructions about where and how to use these. Pharmacies typically print the medication instructions only on the boxes and not directly on the medication tubes.   If your medication is too expensive, please contact our office at [phone] option 4 or send [place] a message through MyChart.   We are unable to tell what your co-pay for medications will be in advance as this is different depending on your insurance coverage. However, we may be able to find a substitute medication at lower cost or fill out paperwork to get insurance to cover a needed medication.   If a prior authorization is required to get your medication covered by your insurance company, please allow [place] 1-2 business days to complete this process.  Drug prices often vary depending on where the prescription is filled and some pharmacies may offer cheaper prices.  The  website www.goodrx.com contains coupons for medications through different pharmacies. The prices here do not account for what the cost may be with help from insurance (it may be cheaper with your insurance), but the website can give you the price if you did not use any insurance.  - You can print the associated coupon and take it with your prescription to the pharmacy.  - You may also stop by our office during regular business hours and pick up a GoodRx coupon card.  - If you need your prescription sent electronically to a different pharmacy, notify our office through [facility] or by phone at [phone] option 4.     Si Usted Necesita Algo Despus de Su Visita  Tambin puede enviarnos un mensaje a travs de Clinical cytogeneticist. Por lo general respondemos a los mensajes de MyChart en el transcurso de 1 a 2 das hbiles.  Para renovar recetas, por favor pida a su farmacia que se ponga en contacto con nuestra oficina. Annie Sable de fax es [place] [phone].  Si tiene un asunto urgente cuando la clnica est cerrada y que no puede esperar hasta el siguiente da hbil, puede llamar/localizar a su doctor(a) al nmero que aparece a continuacin.   Por favor, tenga en cuenta que aunque hacemos todo lo posible para estar disponibles para asuntos urgentes fuera del horario de [place], no estamos disponibles las 24 horas del da, los 7 [address]  [address] de la [place].   Si tiene un problema urgente y no puede comunicarse con nosotros, puede optar por buscar atencin mdica  en el consultorio de su doctor(a), en una clnica privada, en un centro de atencin urgente o en una sala de emergencias.  Si tiene Engineer, drilling, por favor llame inmediatamente al 911 o vaya a la sala de emergencias.  Nmeros de bper  - Dr. Gwen Pounds: [phone]  - Dra. Roseanne Reno: [phone]  - Dr. Katrinka Blazing: [phone]   En caso de inclemencias del tiempo, por favor llame a Lacy Duverney principal al [phone] para una  actualizacin sobre el [place] de cualquier retraso o cierre.  Consejos para la medicacin en dermatologa: Por favor, guarde las cajas en las que vienen los medicamentos de uso tpico para ayudarle a seguir las instrucciones sobre dnde y cmo usarlos. Las farmacias generalmente imprimen las instrucciones del medicamento slo en las cajas y no directamente en los tubos del [place].   Si su medicamento es muy caro, por favor, pngase en contacto con Rolm Gala llamando al [phone] y presione la opcin 4 o envenos un mensaje a travs de Clinical cytogeneticist.   No podemos decirle cul ser su copago por los medicamentos por adelantado ya que esto es diferente dependiendo de la cobertura de su seguro. Sin embargo, es posible que podamos encontrar un medicamento sustituto a Audiological scientist un formulario para que el seguro cubra el medicamento que se considera necesario.   Si se requiere una autorizacin previa para que su compaa de seguros [place] su medicamento, por favor permtanos de 1 a 2 das hbiles para completar [address]  Los precios de los medicamentos varan con frecuencia dependiendo del Environmental consultant de dnde se surte la receta y alguna farmacias pueden ofrecer precios ms baratos.  El sitio web www.goodrx.com tiene cupones para medicamentos de Health and safety inspector. Los precios aqu no tienen en cuenta lo que podra costar con la ayuda del seguro (puede ser ms barato con su seguro), pero el sitio web puede darle el precio si no utiliz Tourist information centre manager.  - Puede imprimir el cupn correspondiente y llevarlo con su receta a la farmacia.  - Tambin puede pasar por nuestra oficina durante el horario de atencin regular y Education officer, museum una tarjeta de cupones de GoodRx.  - Si necesita que su receta se enve electrnicamente a una farmacia diferente, informe a nuestra oficina a travs de MyChart de [place] o por telfono llamando al [phone] y presione la opcin 4.

## 2023-07-01 ENCOUNTER — Inpatient Hospital Stay: Payer: PPO | Attending: Oncology

## 2023-07-01 ENCOUNTER — Other Ambulatory Visit: Payer: Self-pay

## 2023-07-01 DIAGNOSIS — D649 Anemia, unspecified: Secondary | ICD-10-CM

## 2023-07-01 DIAGNOSIS — D508 Other iron deficiency anemias: Secondary | ICD-10-CM | POA: Diagnosis not present

## 2023-07-01 DIAGNOSIS — Z79899 Other long term (current) drug therapy: Secondary | ICD-10-CM | POA: Insufficient documentation

## 2023-07-01 LAB — CBC WITH DIFFERENTIAL (CANCER CENTER ONLY)
Abs Immature Granulocytes: 0.02 10*3/uL (ref 0.00–0.07)
Basophils Absolute: 0.1 10*3/uL (ref 0.0–0.1)
Basophils Relative: 1 %
Eosinophils Absolute: 0.3 10*3/uL (ref 0.0–0.5)
Eosinophils Relative: 6 %
HCT: 34.4 % — ABNORMAL LOW (ref 39.0–52.0)
Hemoglobin: 11.5 g/dL — ABNORMAL LOW (ref 13.0–17.0)
Immature Granulocytes: 0 %
Lymphocytes Relative: 16 %
Lymphs Abs: 0.9 10*3/uL (ref 0.7–4.0)
MCH: 31.1 pg (ref 26.0–34.0)
MCHC: 33.4 g/dL (ref 30.0–36.0)
MCV: 93 fL (ref 80.0–100.0)
Monocytes Absolute: 0.8 10*3/uL (ref 0.1–1.0)
Monocytes Relative: 14 %
Neutro Abs: 3.5 10*3/uL (ref 1.7–7.7)
Neutrophils Relative %: 63 %
Platelet Count: 178 10*3/uL (ref 150–400)
RBC: 3.7 MIL/uL — ABNORMAL LOW (ref 4.22–5.81)
RDW: 13.2 % (ref 11.5–15.5)
WBC Count: 5.6 10*3/uL (ref 4.0–10.5)
nRBC: 0 % (ref 0.0–0.2)

## 2023-07-01 LAB — FERRITIN: Ferritin: 25 ng/mL (ref 24–336)

## 2023-07-01 LAB — IRON AND TIBC
Iron: 65 ug/dL (ref 45–182)
Saturation Ratios: 16 % — ABNORMAL LOW (ref 17.9–39.5)
TIBC: 407 ug/dL (ref 250–450)
UIBC: 342 ug/dL

## 2023-07-01 NOTE — Telephone Encounter (Signed)
LMTCB at phone [phone]

## 2023-07-02 ENCOUNTER — Telehealth: Payer: Self-pay | Admitting: *Deleted

## 2023-07-02 ENCOUNTER — Other Ambulatory Visit: Payer: Self-pay | Admitting: Oncology

## 2023-07-02 DIAGNOSIS — D509 Iron deficiency anemia, unspecified: Secondary | ICD-10-CM | POA: Insufficient documentation

## 2023-07-02 DIAGNOSIS — D508 Other iron deficiency anemias: Secondary | ICD-10-CM

## 2023-07-02 NOTE — Telephone Encounter (Signed)
Called the pt. And told him that his ferritin and iron sats low and rao would like pt to get iv iron. Pt is ok for getting iv iron and we will get him appts and have someone to call him back

## 2023-07-06 ENCOUNTER — Encounter: Payer: Self-pay | Admitting: Dermatology

## 2023-07-09 ENCOUNTER — Telehealth: Payer: Self-pay | Admitting: Family Medicine

## 2023-07-09 ENCOUNTER — Telehealth: Payer: PPO | Admitting: Family Medicine

## 2023-07-09 ENCOUNTER — Encounter: Payer: Self-pay | Admitting: Oncology

## 2023-07-09 DIAGNOSIS — K7031 Alcoholic cirrhosis of liver with ascites: Secondary | ICD-10-CM

## 2023-07-09 MED ORDER — EPLERENONE 25 MG PO TABS: ORAL_TABLET | ORAL | 0 refills | Status: DC

## 2023-07-09 NOTE — Telephone Encounter (Signed)
 Patient needs follow up office visit in 2-3 weeks. I told him I had an opening on the 22nd, but it looks like he has a conflicting appointment at cancer center that morning.  Please contact to schedule appointment on another day, OK to use a SameDay appointment slot.

## 2023-07-09 NOTE — Progress Notes (Signed)
 MyChart Video Visit    Virtual Visit via Video Note   This format is felt to be most appropriate for this patient at this time. Physical exam was limited by quality of the video and audio technology used for the visit.   Patient location: home Provider location: [facility]  I discussed the limitations of evaluation and management by telemedicine and the availability of in person appointments. The patient expressed understanding and agreed to proceed.  Patient: [name]   DOB: 05-Nov-1950   72 y.o. Male  MRN: [id number] Visit Date: 07/09/2023  Today's healthcare provider: Nancyann Perry, MD   Chief Complaint  Patient presents with   Abdominal swelling   Subjective    HPI  Patient c/o worsening abdominal swelling. He had been controlled on spironolactone  which was discontinued when he developed painful gynecomastia in the fall. He as sent prescription for eplerenone  in November, but now he does not recall ever starting that medication and does not have any at this time. He is still taking 40 mg furosemide  twice a day.   Medications: Outpatient Medications Prior to Visit  Medication Sig   Accu-Chek Softclix Lancets lancets Use to check glucose ac and as needed   acetaminophen  (TYLENOL ) 500 MG tablet Take 1,000 mg by mouth every 6 (six) hours as needed.   allopurinol  (ZYLOPRIM ) 100 MG tablet TAKE 1 TABLET BY MOUTH EVERY DAY (Patient taking differently: Take 100 mg by mouth every morning.)   B-D UF III MINI PEN NEEDLES 31G X 5 MM MISC USE 1 TO CHECK BLOOD SUGAR 3 TIMES DAILY   carvedilol  (COREG ) 3.125 MG tablet TAKE 1 TABLET BY MOUTH 2 TIMES DAILY.   Continuous Blood Gluc Receiver (FREESTYLE LIBRE 14 DAY READER) DEVI 1 Device by Does not apply route every 14 (fourteen) days. for type insulin  dependent type 2 diabetes   Continuous Blood Gluc Sensor (FREESTYLE LIBRE 14 DAY SENSOR) MISC Place 1 Device onto the skin every 14 (fourteen) days. for insulin  dependent type 2 diabetes    eplerenone  (INSPRA ) 25 MG tablet Take 1 tablet (25 mg total) by mouth 2 (two) times daily.   furosemide  (LASIX ) 20 MG tablet Take 2 tablets (40 mg total) by mouth 2 (two) times daily.   insulin  degludec (TRESIBA  FLEXTOUCH) 100 UNIT/ML FlexTouch Pen INJECT UP TO 20 UNITS UNDER THE SKIN EVERY DAY AT BEDTIME. NEED APPT FOR REFILLS. (Patient taking differently: 10-20 Units at bedtime.)   magnesium  gluconate (MAGONATE) 500 MG tablet Take 500 mg by mouth daily.    Multiple Vitamin (MULTIVITAMIN WITH MINERALS) TABS tablet Take 1 tablet by mouth daily.   mupirocin  ointment (BACTROBAN ) 2 % Apply 1 Application topically daily. qd to open sores on scalp   niacin 500 MG tablet Take 500 mg by mouth at bedtime.   NOVOLOG  FLEXPEN 100 UNIT/ML FlexPen INJECT 20 UNITS UP TO THREE TIMES A DAY BEFORE MEALS. (Patient taking differently: 5-20 Units 3 (three) times daily with meals. Inject 20 units up to three times a day before meals. SS)   omeprazole  (PRILOSEC) 40 MG capsule TAKE 1 CAPSULE BY MOUTH EVERY DAY (Patient taking differently: 40 mg every morning. TAKE 1 CAPSULE BY MOUTH EVERY DAY)   ONETOUCH VERIO test strip USE TO CHECK SUGAR THREE TIMES DAILY FOR INSULIN  DEPENDENT TYPE 2 DIABETES   oxyCODONE  (OXY IR/ROXICODONE ) 5 MG immediate release tablet Take 1 tablet (5 mg total) by mouth every 6 (six) hours as needed for severe pain.   promethazine  (PHENERGAN ) 25 MG  tablet TAKE 1 TABLET BY MOUTH EVERY 8 HOURS AS NEEDED FOR NAUSEA OR VOMITING.   pyridOXINE (VITAMIN B-6) 50 MG tablet Take 50 mg by mouth daily.   sertraline  (ZOLOFT ) 100 MG tablet TAKE 1 TABLET BY MOUTH EVERY DAY (Patient taking differently: Take 100 mg by mouth every morning.)   traMADol  (ULTRAM ) 50 MG tablet TAKE 1 TABLET BY MOUTH EVERY 8 HOURS AS NEEDED.   zinc gluconate 50 MG tablet Take 50 mg by mouth daily.   No facility-administered medications prior to visit.     Objective    There were no vitals taken for this visit.   Physical Exam   Awake, alert, oriented x 3. In no apparent distress   Assessment & Plan     1. Alcoholic cirrhosis of liver with ascites (HCC) (Primary) Worse since stopping spironolactone , never started eplerenone  prescribed in November  Start eplerenone  (INSPRA ) 25 MG tablet; Take 1 tablet once daily for 1 week, then increase to 1 tablet twice daily  Dispense: 60 tablet; Refill: 0   Follow up to check electrolytes 2 weeks.     I discussed the assessment and treatment plan with the patient. The patient was provided an opportunity to ask questions and all were answered. The patient agreed with the plan and demonstrated an understanding of the instructions.   The patient was advised to call back or seek an in-person evaluation if the symptoms worsen or if the condition fails to improve as anticipated.  I provided 10 minutes of non-face-to-face time during this encounter.   Nancyann Perry, MD [facility] [phone] (phone) [phone] (fax)  [facility]

## 2023-07-11 ENCOUNTER — Encounter: Payer: Self-pay | Admitting: Oncology

## 2023-07-11 NOTE — Telephone Encounter (Signed)
 Error

## 2023-07-12 NOTE — Telephone Encounter (Signed)
 Patient's appointment has been scheduled for 08/02/23 with Dr. Sherrie Mustache

## 2023-07-16 ENCOUNTER — Inpatient Hospital Stay: Payer: PPO | Attending: Oncology

## 2023-07-16 VITALS — BP 108/71 | HR 65 | Temp 96.7°F | Resp 18

## 2023-07-16 DIAGNOSIS — D508 Other iron deficiency anemias: Secondary | ICD-10-CM | POA: Diagnosis not present

## 2023-07-16 DIAGNOSIS — Z79899 Other long term (current) drug therapy: Secondary | ICD-10-CM | POA: Diagnosis not present

## 2023-07-16 MED ORDER — SODIUM CHLORIDE 0.9 % IV SOLN
Freq: Once | INTRAVENOUS | Status: AC
  Filled 2023-07-16: qty 250

## 2023-07-16 MED ORDER — SODIUM CHLORIDE 0.9 % IV SOLN
510.0000 mg | INTRAVENOUS | Status: DC
  Administered 2023-07-16: 510 mg via INTRAVENOUS
  Filled 2023-07-16: qty 510

## 2023-07-17 ENCOUNTER — Other Ambulatory Visit: Payer: Self-pay | Admitting: Family Medicine

## 2023-07-19 ENCOUNTER — Other Ambulatory Visit: Payer: Self-pay | Admitting: Family Medicine

## 2023-07-19 DIAGNOSIS — E1165 Type 2 diabetes mellitus with hyperglycemia: Secondary | ICD-10-CM

## 2023-07-23 ENCOUNTER — Inpatient Hospital Stay: Payer: PPO

## 2023-07-23 VITALS — BP 138/75 | HR 64 | Temp 95.7°F | Resp 18

## 2023-07-23 DIAGNOSIS — D508 Other iron deficiency anemias: Secondary | ICD-10-CM

## 2023-07-23 MED ORDER — SODIUM CHLORIDE 0.9 % IV SOLN
510.0000 mg | INTRAVENOUS | Status: DC
  Administered 2023-07-23: 510 mg via INTRAVENOUS
  Filled 2023-07-23: qty 510

## 2023-07-23 MED ORDER — SODIUM CHLORIDE 0.9 % IV SOLN
INTRAVENOUS | Status: DC
Start: 2023-07-23 — End: 2023-07-23
  Filled 2023-07-23: qty 250

## 2023-07-31 ENCOUNTER — Ambulatory Visit: Payer: PPO

## 2023-08-02 ENCOUNTER — Ambulatory Visit (INDEPENDENT_AMBULATORY_CARE_PROVIDER_SITE_OTHER): Payer: PPO | Admitting: Family Medicine

## 2023-08-02 VITALS — BP 127/78 | HR 57 | Temp 97.4°F | Resp 16 | Ht 66.0 in | Wt 147.6 lb

## 2023-08-02 DIAGNOSIS — K7030 Alcoholic cirrhosis of liver without ascites: Secondary | ICD-10-CM | POA: Diagnosis not present

## 2023-08-02 DIAGNOSIS — Z794 Long term (current) use of insulin: Secondary | ICD-10-CM

## 2023-08-02 DIAGNOSIS — E1165 Type 2 diabetes mellitus with hyperglycemia: Secondary | ICD-10-CM

## 2023-08-02 NOTE — Progress Notes (Signed)
Established patient visit   Patient: [name]   DOB: 1950/09/18   73 y.o. Male  MRN: [id number] Visit Date: 08/02/2023  Today's healthcare provider: Mila Merry, MD   Chief Complaint  Patient presents with   Follow-up    2-3 wk f/u still has a lot of fluid in stomach and it causes pain when bending over   Subjective    HPI Follow up alcoholic cirrhosis with ascites since changing from spironolactone to eplerenone due to gynecomastia. Breast pain has resolved, but has been having more abdominal swelling since stopping spironolactone. Is taking eplerenone and furosemide twice a day consistently.   Is also due to follow up for diabetes.   Lab Results  Component Value Date   NA 127 (L) 05/10/2023   CL 90 (L) 05/10/2023   K 5.3 (H) 05/10/2023   CO2 27 05/10/2023   BUN 16 05/10/2023   CREATININE 0.78 05/10/2023   EGFR 95 05/10/2023   CALCIUM 8.9 05/10/2023   PHOS 3.5 05/10/2023   ALBUMIN 3.6 (L) 05/10/2023   GLUCOSE 312 (H) 05/10/2023   Lab Results  Component Value Date   HGBA1C 6.6 (H) 03/13/2023     Medications: Outpatient Medications Prior to Visit  Medication Sig   Accu-Chek Softclix Lancets lancets Use to check glucose ac and as needed   acetaminophen (TYLENOL) 500 MG tablet Take 1,000 mg by mouth every 6 (six) hours as needed.   allopurinol (ZYLOPRIM) 100 MG tablet TAKE 1 TABLET BY MOUTH EVERY DAY (Patient taking differently: Take 100 mg by mouth every morning.)   B-D UF III MINI PEN NEEDLES 31G X 5 MM MISC USE 1 TO CHECK BLOOD SUGAR 3 TIMES DAILY   carvedilol (COREG) 3.125 MG tablet TAKE 1 TABLET BY MOUTH 2 TIMES DAILY.   Continuous Blood Gluc Receiver (FREESTYLE LIBRE 14 DAY READER) DEVI 1 Device by Does not apply route every 14 (fourteen) days. for type insulin dependent type 2 diabetes   Continuous Blood Gluc Sensor (FREESTYLE LIBRE 14 DAY SENSOR) MISC Place 1 Device onto the skin every 14 (fourteen) days. for insulin dependent type 2 diabetes    eplerenone (INSPRA) 25 MG tablet Take 1 tablet once daily for 1 week, then increase to 1 tablet twice daily   furosemide (LASIX) 20 MG tablet Take 2 tablets (40 mg total) by mouth 2 (two) times daily.   insulin degludec (TRESIBA FLEXTOUCH) 100 UNIT/ML FlexTouch Pen INJECT UP TO 20 UNITS UNDER THE SKIN EVERY DAY AT BEDTIME. NEED APPT FOR REFILLS.   magnesium gluconate (MAGONATE) 500 MG tablet Take 500 mg by mouth daily.    Multiple Vitamin (MULTIVITAMIN WITH MINERALS) TABS tablet Take 1 tablet by mouth daily.   mupirocin ointment (BACTROBAN) 2 % Apply 1 Application topically daily. qd to open sores on scalp   niacin 500 MG tablet Take 500 mg by mouth at bedtime.   NOVOLOG FLEXPEN 100 UNIT/ML FlexPen INJECT 20 UNITS UP TO THREE TIMES A DAY BEFORE MEALS. (Patient taking differently: 5-20 Units 3 (three) times daily with meals. Inject 20 units up to three times a day before meals. SS)   omeprazole (PRILOSEC) 40 MG capsule TAKE 1 CAPSULE BY MOUTH EVERY DAY (Patient taking differently: 40 mg every morning. TAKE 1 CAPSULE BY MOUTH EVERY DAY)   ONETOUCH VERIO test strip USE TO CHECK SUGAR THREE TIMES DAILY FOR INSULIN DEPENDENT TYPE 2 DIABETES   oxyCODONE (OXY IR/ROXICODONE) 5 MG immediate release tablet Take 1 tablet (5 mg  total) by mouth every 6 (six) hours as needed for severe pain.   promethazine (PHENERGAN) 25 MG tablet TAKE 1 TABLET BY MOUTH EVERY 8 HOURS AS NEEDED FOR NAUSEA OR VOMITING.   pyridOXINE (VITAMIN B-6) 50 MG tablet Take 50 mg by mouth daily.   sertraline (ZOLOFT) 100 MG tablet TAKE 1 TABLET BY MOUTH EVERY DAY   traMADol (ULTRAM) 50 MG tablet TAKE 1 TABLET BY MOUTH EVERY 8 HOURS AS NEEDED.   zinc gluconate 50 MG tablet Take 50 mg by mouth daily.   No facility-administered medications prior to visit.    Review of Systems  Constitutional:  Negative for appetite change, chills and fever.  Respiratory:  Negative for chest tightness, shortness of breath and wheezing.   Cardiovascular:   Negative for chest pain and palpitations.  Gastrointestinal:  Negative for abdominal pain, nausea and vomiting.       Objective    BP 127/78 (BP Location: Left Arm, Patient Position: Sitting, Cuff Size: Normal)   Pulse (!) 57   Temp (!) 97.4 F (36.3 C)   Resp 16   Ht 5\' 6"  (1.676 m)   Wt 147 lb 9.6 oz (67 kg)   SpO2 100%   BMI 23.82 kg/m    Physical Exam  General Appearance:    Well developed, well nourished male, alert, cooperative, in no acute distress  Eyes:    PERRL, conjunctiva/corneas clear, EOM's intact       Lungs:     Clear to auscultation bilaterally, respirations unlabored  Heart:    Bradycardic. Normal rhythm. No murmurs, rubs, or gallops.    Abdomen:   soft, round. Non tender. Moderately distended.         Assessment & Plan     1. Alcoholic cirrhosis of liver without ascites (HCC) (Primary) Doing well with change from spirolactone to eplerenone due to gynecomastia which has resolved. Still with feeling of fullness, but not painful.  - Renal function panel - AFP tumor marker  2. Type 2 diabetes mellitus with hyperglycemia, with long-term current use of insulin (HCC) Doing well on current insulin regiment.  - Hemoglobin A1c         Mila Merry, MD  [facility] [phone] (phone) [phone] (fax)  [facility]

## 2023-08-03 LAB — AFP TUMOR MARKER: AFP, Serum, Tumor Marker: <1.8 ng/mL (ref 0.0–8.4)

## 2023-08-03 LAB — RENAL FUNCTION PANEL
Albumin: 3.9 g/dL (ref 3.8–4.8)
BUN/Creatinine Ratio: 11 (ref 10–24)
BUN: 8 mg/dL (ref 8–27)
CO2: 24 mmol/L (ref 20–29)
Calcium: 9.1 mg/dL (ref 8.6–10.2)
Chloride: 101 mmol/L (ref 96–106)
Creatinine, Ser: 0.71 mg/dL — ABNORMAL LOW (ref 0.76–1.27)
Glucose: 164 mg/dL — ABNORMAL HIGH (ref 70–99)
Phosphorus: 4.1 mg/dL (ref 2.8–4.1)
Potassium: 5.8 mmol/L — ABNORMAL HIGH (ref 3.5–5.2)
Sodium: 139 mmol/L (ref 134–144)
eGFR: 97 mL/min/{1.73_m2} (ref >59)

## 2023-08-03 LAB — HEMOGLOBIN A1C
Est. average glucose Bld gHb Est-mCnc: 143 mg/dL
Hgb A1c MFr Bld: 6.6 % — ABNORMAL HIGH (ref 4.8–5.6)

## 2023-08-05 ENCOUNTER — Other Ambulatory Visit: Payer: Self-pay | Admitting: Family Medicine

## 2023-08-05 DIAGNOSIS — E875 Hyperkalemia: Secondary | ICD-10-CM

## 2023-08-05 MED ORDER — LOKELMA 5 G PO PACK: 5.0000 g | PACK | Freq: Every day | ORAL | 0 refills | Status: DC

## 2023-08-08 ENCOUNTER — Ambulatory Visit: Payer: PPO | Admitting: Dermatology

## 2023-08-08 DIAGNOSIS — L821 Other seborrheic keratosis: Secondary | ICD-10-CM

## 2023-08-08 DIAGNOSIS — Z7189 Other specified counseling: Secondary | ICD-10-CM | POA: Diagnosis not present

## 2023-08-08 DIAGNOSIS — W908XXA Exposure to other nonionizing radiation, initial encounter: Secondary | ICD-10-CM

## 2023-08-08 DIAGNOSIS — D692 Other nonthrombocytopenic purpura: Secondary | ICD-10-CM

## 2023-08-08 DIAGNOSIS — L578 Other skin changes due to chronic exposure to nonionizing radiation: Secondary | ICD-10-CM

## 2023-08-08 DIAGNOSIS — L570 Actinic keratosis: Secondary | ICD-10-CM

## 2023-08-08 DIAGNOSIS — L814 Other melanin hyperpigmentation: Secondary | ICD-10-CM

## 2023-08-08 MED ORDER — AMINOLEVULINIC ACID HCL 10 % EX GEL
2000.0000 mg | Freq: Once | CUTANEOUS | Status: AC
  Administered 2023-08-08: 2000 mg via TOPICAL

## 2023-08-08 NOTE — Patient Instructions (Addendum)
Ameluz/Red Light Treatment Common Side Effects  - Burning/stinging, which may be severe and last up to 24-72 hours after your treatment  - Redness, swelling and/or peeling which may last up to 4 weeks  - Scaling/crusting which may last up to 2 weeks  - Sun sensitivity (you MUST avoid sun exposure for 48-72 hours after treatment)  Care Instructions  - Okay to wash with soap and water and shampoo as normal  - If needed, you can do a cold compress (ex. Ice packs) for comfort  - If okay with your Primary Doctor, you may use analgesics such as Tylenol every 4-6 hours, not to exceed recommended dose  - You may apply Cerave Healing Ointment, Vaseline or Aquaphor  - If you have a lot of swelling you may take a Benadryl to help with this (this may cause drowsiness)  Sun Precautions  - Wear a wide brim hat for the next week if outside  - Wear a sunblock with zinc or titanium dioxide at least SPF 50 daily   We will recheck you in 10-12 weeks. If any problems, please call the office and ask to speak with a nurse.  Cryotherapy Aftercare  Wash gently with soap and water everyday.   Apply Vaseline and Band-Aid daily until healed.    Due to recent changes in healthcare laws, you may see results of your pathology and/or laboratory studies on MyChart before the doctors have had a chance to review them. We understand that in some cases there may be results that are confusing or concerning to you. Please understand that not all results are received at the same time and often the doctors may need to interpret multiple results in order to provide you with the best plan of care or course of treatment. Therefore, we ask that you please give [place] 2 business days to thoroughly review all your results before contacting the office for clarification. Should we see a critical lab result, you will be contacted sooner.   If You Need Anything After Your Visit  If you have any questions or concerns for your  doctor, please call our main line at [phone] and press option 4 to reach your doctor's medical assistant. If no one answers, please leave a voicemail as directed and we will return your call as soon as possible. Messages left after 4 pm will be answered the following business day.   You may also send [place] a message via MyChart. We typically respond to MyChart messages within 1-2 business days.  For prescription refills, please ask your pharmacy to contact our office. Our fax number is [phone].  If you have an urgent issue when the clinic is closed that cannot wait until the next business day, you can page your doctor at the number below.    Please note that while we do our best to be available for urgent issues outside of office hours, we are not available 24/7.   If you have an urgent issue and are unable to reach [place], you may choose to seek medical care at your doctor's office, retail clinic, urgent care center, or emergency room.  If you have a medical emergency, please immediately call 911 or go to the emergency department.  Pager Numbers  - Dr. Gwen Pounds: [phone]  - Dr. Roseanne Reno: [phone]  - Dr. Katrinka Blazing: [phone]   In the event of inclement weather, please call our main line at [phone] for an update on the status of any delays or closures.  Dermatology  Medication Tips: Please keep the boxes that topical medications come in in order to help keep track of the instructions about where and how to use these. Pharmacies typically print the medication instructions only on the boxes and not directly on the medication tubes.   If your medication is too expensive, please contact our office at [phone] option 4 or send [place] a message through MyChart.   We are unable to tell what your co-pay for medications will be in advance as this is different depending on your insurance coverage. However, we may be able to find a substitute medication at lower cost or fill out  paperwork to get insurance to cover a needed medication.   If a prior authorization is required to get your medication covered by your insurance company, please allow [place] 1-2 business days to complete this process.  Drug prices often vary depending on where the prescription is filled and some pharmacies may offer cheaper prices.  The website www.goodrx.com contains coupons for medications through different pharmacies. The prices here do not account for what the cost may be with help from insurance (it may be cheaper with your insurance), but the website can give you the price if you did not use any insurance.  - You can print the associated coupon and take it with your prescription to the pharmacy.  - You may also stop by our office during regular business hours and pick up a GoodRx coupon card.  - If you need your prescription sent electronically to a different pharmacy, notify our office through [facility] or by phone at [phone] option 4.     Si Usted Necesita Algo Despus de Su Visita  Tambin puede enviarnos un mensaje a travs de Clinical cytogeneticist. Por lo general respondemos a los mensajes de MyChart en el transcurso de 1 a 2 das hbiles.  Para renovar recetas, por favor pida a su farmacia que se ponga en contacto con nuestra oficina. Annie Sable de fax es [place] [phone].  Si tiene un asunto urgente cuando la clnica est cerrada y que no puede esperar hasta el siguiente da hbil, puede llamar/localizar a su doctor(a) al nmero que aparece a continuacin.   Por favor, tenga en cuenta que aunque hacemos todo lo posible para estar disponibles para asuntos urgentes fuera del horario de [employer], no estamos disponibles las 24 horas del da, los 7 [address]  [address] de la [place].   Si tiene un problema urgente y no puede comunicarse con nosotros, puede optar por buscar atencin mdica  en el consultorio de su doctor(a), en una clnica privada, en un centro de atencin urgente o en una sala de  emergencias.  Si tiene Engineer, drilling, por favor llame inmediatamente al 911 o vaya a la sala de emergencias.  Nmeros de bper  - Dr. Gwen Pounds: [phone]  - Dra. Roseanne Reno: [phone]  - Dr. Katrinka Blazing: [phone]   En caso de inclemencias del tiempo, por favor llame a Lacy Duverney principal al [phone] para una actualizacin sobre el [place] de cualquier retraso o cierre.  Consejos para la medicacin en dermatologa: Por favor, guarde las cajas en las que vienen los medicamentos de uso tpico para ayudarle a seguir las instrucciones sobre dnde y cmo usarlos. Las farmacias generalmente imprimen las instrucciones del medicamento slo en las cajas y no directamente en los tubos del [place].   Si su medicamento es muy caro, por favor, pngase en contacto con Rolm Gala llamando al [phone] y presione la opcin 4 o envenos un  mensaje a travs de MyChart.   No podemos decirle cul ser su copago por los medicamentos por adelantado ya que esto es diferente dependiendo de la cobertura de su seguro. Sin embargo, es posible que podamos encontrar un medicamento sustituto a Audiological scientist un formulario para que el seguro cubra el medicamento que se considera necesario.   Si se requiere una autorizacin previa para que su compaa de seguros [place] su medicamento, por favor permtanos de 1 a 2 das hbiles para completar [address]  Los precios de los medicamentos varan con frecuencia dependiendo del Environmental consultant de dnde se surte la receta y alguna farmacias pueden ofrecer precios ms baratos.  El sitio web www.goodrx.com tiene cupones para medicamentos de Health and safety inspector. Los precios aqu no tienen en cuenta lo que podra costar con la ayuda del seguro (puede ser ms barato con su seguro), pero el sitio web puede darle el precio si no utiliz Tourist information centre manager.  - Puede imprimir el cupn correspondiente y llevarlo con su receta a la farmacia.  - Tambin puede pasar por  nuestra oficina durante el horario de atencin regular y Education officer, museum una tarjeta de cupones de GoodRx.  - Si necesita que su receta se enve electrnicamente a una farmacia diferente, informe a nuestra oficina a travs de MyChart de [place] o por telfono llamando al [phone] y presione la opcin 4.

## 2023-08-08 NOTE — Progress Notes (Signed)
Follow-Up Visit   Subjective  [name] is a 73 y.o. male who presents for the following: 1 month f/u on precancers on his scalp, patient was scheduled for a red light treatment a few weeks ago but he canceled that appointment due to snow. .  The patient has spots on his hands and arms to be evaluated, some may be new or changing.    The following portions of the chart were reviewed this encounter and updated as appropriate: medications, allergies, medical history  Review of Systems:  No other skin or systemic complaints except as noted in HPI or Assessment and Plan.  Objective  Well appearing patient in no apparent distress; mood and affect are within normal limits.  A focused examination was performed of the following areas:face,scalp   Relevant exam findings are noted in the Assessment and Plan.  left frontal scalp x 3, left eye brow x 1, left crown x 5 (9) Erythematous thin papules/macules with gritty scale.   Assessment & Plan   Purpura - Chronic; persistent and recurrent.  Treatable, but not curable. Arms,hands  - Violaceous macules and patches - Benign - Related to trauma, age, sun damage and/or use of blood thinners, chronic use of topical and/or oral steroids - Observe - Can use OTC arnica containing moisturizer such as Dermend Bruise Formula if desired - Call for worsening or other concerns   LENTIGINES Exam: scattered tan macules Due to sun exposure Treatment Plan: Benign-appearing, observe. Recommend daily broad spectrum sunscreen SPF 30+ to sun-exposed areas, reapply every 2 hours as needed.  Call for any changes   SEBORRHEIC KERATOSIS - Stuck-on, waxy, tan-brown papules and/or plaques  - Benign-appearing - Discussed benign etiology and prognosis. - Observe - Call for any changes   ACTINIC DAMAGE WITH PRECANCEROUS ACTINIC KERATOSES Counseling for Topical Chemotherapy Management: Patient exhibits: - Severe, confluent actinic changes with  pre-cancerous actinic keratoses that is secondary to cumulative UV radiation exposure over time - Condition that is severe; chronic, not at goal. - diffuse scaly erythematous macules and papules with underlying dyspigmentation - Discussed Prescription "Field Treatment" topical Chemotherapy for Severe, Chronic Confluent Actinic Changes with Pre-Cancerous Actinic Keratoses Field treatment involves treatment of an entire area of skin that has confluent Actinic Changes (Sun/ Ultraviolet light damage) and PreCancerous Actinic Keratoses by method of PhotoDynamic Therapy (PDT) and/or prescription Topical Chemotherapy agents such as 5-fluorouracil, 5-fluorouracil/calcipotriene, and/or imiquimod.  The purpose is to decrease the number of clinically evident and subclinical PreCancerous lesions to prevent progression to development of skin cancer by chemically destroying early precancer changes that may or may not be visible.  It has been shown to reduce the risk of developing skin cancer in the treated area. As a result of treatment, redness, scaling, crusting, and open sores may occur during treatment course. One or more than one of these methods may be used and may have to be used several times to control, suppress and eliminate the PreCancerous changes. Discussed treatment course, expected reaction, and possible side effects. - Recommend daily broad spectrum sunscreen SPF 30+ to sun-exposed areas, reapply every 2 hours as needed.  - Staying in the shade or wearing long sleeves, sun glasses (UVA+UVB protection) and wide brim hats (4-inch brim around the entire circumference of the hat) are also recommended. - Call for new or changing lesions.  -Recommend Red Light treatment with debridement to scalp today and red light with debridement on face in February   AK (ACTINIC KERATOSIS)   Related Medications  Aminolevulinic Acid HCl 10 % GEL 2,000 mg  ACTINIC SKIN DAMAGE   COUNSELING AND COORDINATION OF  CARE   SENILE PURPURA (HCC)   SEBORRHEIC KERATOSIS   LENTIGO   HYPERTROPHIC ACTINIC KERATOSIS (9) left frontal scalp x 3, left eye brow x 1, left crown x 5 (9) Actinic keratoses are precancerous spots that appear secondary to cumulative UV radiation exposure/sun exposure over time. They are chronic with expected duration over 1 year. A portion of actinic keratoses will progress to squamous cell carcinoma of the skin. It is not possible to reliably predict which spots will progress to skin cancer and so treatment is recommended to prevent development of skin cancer.  Recommend daily broad spectrum sunscreen SPF 30+ to sun-exposed areas, reapply every 2 hours as needed.  Recommend staying in the shade or wearing long sleeves, sun glasses (UVA+UVB protection) and wide brim hats (4-inch brim around the entire circumference of the hat). Call for new or changing lesions.  Destruction of lesion - left frontal scalp x 3, left eye brow x 1, left crown x 5 (9)  Destruction method: cryotherapy   Informed consent: discussed and consent obtained   Lesion destroyed using liquid nitrogen: Yes   Region frozen until ice ball extended beyond lesion: Yes   Outcome: patient tolerated procedure well with no complications   Post-procedure details: wound care instructions given   Additional details:  Prior to procedure, discussed risks of blister formation, small wound, skin dyspigmentation, or rare scar following cryotherapy. Recommend Vaseline ointment to treated areas while healing.  Patient completed red light phototherapy with debridement today to scalp.  ACTINIC KERATOSES Exam: Erythematous thin papules/macules with gritty scale.  Treatment Plan:  Red Light Photodynamic therapy  Procedure discussed: discussed risks, benefits, side effects. and alternatives   Prep: site scrubbed/prepped with acetone   Debridement needed: Yes (performed by Physician with sand paper.  (CPT C5184948)) Location:   scalp Number of lesions:  Multiple (> 15) Type of treatment:  Red light Aminolevulinic Acid (see MAR for details): Ameluz Aminolevulinic Acid comment:  [id number] Amount of Ameluz (mg):  1 Incubation time (minutes):  120 Number of minutes under lamp:  10 Cooling:  Fan Outcome: patient tolerated procedure well with no complications   Post-procedure details: sunscreen applied and aftercare instructions given to patient    Related Medications Aminolevulinic Acid HCl 10 % GEL 2,000 mg  I personally debrided area prior to application of aminolevulinic acid, Willeen Niece MD    Return in about 6 months (around 02/05/2024) for AKs and return in 1 month with a nurse for red light with debridement-face.  I, Angelique Holm, CMA, am acting as scribe for Willeen Niece, MD .   Documentation: I have reviewed the above documentation for accuracy and completeness, and I agree with the above.  Willeen Niece, MD

## 2023-08-13 ENCOUNTER — Other Ambulatory Visit: Payer: Self-pay | Admitting: Family Medicine

## 2023-08-13 DIAGNOSIS — K7031 Alcoholic cirrhosis of liver with ascites: Secondary | ICD-10-CM

## 2023-08-14 ENCOUNTER — Encounter: Payer: Self-pay | Admitting: Family Medicine

## 2023-08-15 ENCOUNTER — Encounter: Payer: Self-pay | Admitting: Family Medicine

## 2023-08-15 ENCOUNTER — Other Ambulatory Visit: Payer: Self-pay | Admitting: Family Medicine

## 2023-08-15 DIAGNOSIS — E875 Hyperkalemia: Secondary | ICD-10-CM

## 2023-08-15 MED ORDER — EPLERENONE 25 MG PO TABS: 25.0000 mg | ORAL_TABLET | Freq: Two times a day (BID) | ORAL | 0 refills | Status: DC

## 2023-08-28 ENCOUNTER — Other Ambulatory Visit: Payer: Self-pay | Admitting: Family Medicine

## 2023-08-28 DIAGNOSIS — K7031 Alcoholic cirrhosis of liver with ascites: Secondary | ICD-10-CM

## 2023-08-28 NOTE — Telephone Encounter (Signed)
Requested medication (s) are due for refill today: yes  Requested medication (s) are on the active medication list: yes  Last refill:  06/01/22 #90/4  Future visit scheduled: no  Notes to clinic:  Unable to refill per protocol due to failed labs, no updated results.      Requested Prescriptions  Pending Prescriptions Disp Refills   allopurinol (ZYLOPRIM) 100 MG tablet [Pharmacy Med Name: ALLOPURINOL 100 MG TABLET] 90 tablet 4    Sig: TAKE 1 TABLET BY MOUTH EVERY DAY     Endocrinology:  Gout Agents - allopurinol Failed - 08/28/2023  2:55 PM      Failed - Uric Acid in normal range and within 360 days    Uric Acid  Date Value Ref Range Status  10/02/2016 2.6 (L) 3.7 - 8.6 mg/dL Final    Comment:               Therapeutic target for gout patients: <6.0         Failed - Cr in normal range and within 360 days    Creat  Date Value Ref Range Status  06/10/2017 0.81 0.70 - 1.25 mg/dL Final    Comment:    For patients >40 years of age, the reference limit for Creatinine is approximately 13% higher for people identified as African-American. .    Creatinine, Ser  Date Value Ref Range Status  08/02/2023 0.71 (L) 0.76 - 1.27 mg/dL Final         Passed - Valid encounter within last 12 months    Recent Outpatient Visits           3 weeks ago Alcoholic cirrhosis of liver without ascites (HCC)   [place] [facility] Malva Limes, MD   1 month ago Alcoholic cirrhosis of liver with ascites (HCC)   [place] [facility] Malva Limes, MD   2 months ago Alcoholic cirrhosis of liver with ascites (HCC)   [place] [facility] Malva Limes, MD   3 months ago Mass of upper outer quadrant of left breast   [place] [facility] Malva Limes, MD   3 months ago Alcoholic cirrhosis of liver with ascites [facility])   [place] [facility] Malva Limes, MD       Future Appointments              In 3 months Deirdre Evener, MD [facility]            Passed - CBC within normal limits and completed in the last 12 months    WBC  Date Value Ref Range Status  04/05/2023 6.0 4.0 - 10.5 K/uL Final   WBC Count  Date Value Ref Range Status  07/01/2023 5.6 4.0 - 10.5 K/uL Final   RBC  Date Value Ref Range Status  07/01/2023 3.70 (L) 4.22 - 5.81 MIL/uL Final   Hemoglobin  Date Value Ref Range Status  07/01/2023 11.5 (L) 13.0 - 17.0 g/dL Final  30/16/0109 32.3 (L) 13.0 - 17.7 g/dL Final   HCT  Date Value Ref Range Status  07/01/2023 34.4 (L) 39.0 - 52.0 % Final   Hematocrit  Date Value Ref Range Status  04/19/2023 34.6 (L) 37.5 - 51.0 % Final   MCHC  Date Value Ref Range Status  07/01/2023 33.4 30.0 - 36.0 g/dL Final   [facility]  Date Value Ref Range Status  07/01/2023 31.1 26.0 - 34.0  pg Final   MCV  Date Value Ref Range Status  07/01/2023 93.0 80.0 - 100.0 fL Final  04/19/2023 95 79 - 97 fL Final  04/27/2014 87 80 - 100 fL Final   No results found for: "PLTCOUNTKUC", "LABPLAT", "POCPLA" RDW  Date Value Ref Range Status  07/01/2023 13.2 11.5 - 15.5 % Final  04/19/2023 14.0 11.6 - 15.4 % Final  04/27/2014 16.4 (H) 11.5 - 14.5 % Final

## 2023-09-01 ENCOUNTER — Other Ambulatory Visit: Payer: Self-pay | Admitting: Family Medicine

## 2023-09-04 ENCOUNTER — Ambulatory Visit: Payer: PPO

## 2023-09-08 ENCOUNTER — Other Ambulatory Visit: Payer: Self-pay | Admitting: Gastroenterology

## 2023-09-09 MED ORDER — CARVEDILOL 3.125 MG PO TABS: 3.1250 mg | ORAL_TABLET | Freq: Two times a day (BID) | ORAL | 0 refills | Status: DC

## 2023-09-12 ENCOUNTER — Ambulatory Visit: Payer: PPO

## 2023-09-12 DIAGNOSIS — E875 Hyperkalemia: Secondary | ICD-10-CM | POA: Diagnosis not present

## 2023-09-12 DIAGNOSIS — L570 Actinic keratosis: Secondary | ICD-10-CM

## 2023-09-12 MED ORDER — AMINOLEVULINIC ACID HCL 10 % EX GEL
2000.0000 mg | Freq: Once | CUTANEOUS | Status: AC
  Administered 2023-09-12: 2000 mg via TOPICAL

## 2023-09-12 NOTE — Progress Notes (Signed)
 Patient completed red light phototherapy with debridement today.  ACTINIC KERATOSES Exam: Erythematous thin papules/macules with gritty scale.  Treatment Plan:  Red Light Photodynamic therapy  Procedure discussed: discussed risks, benefits, side effects. and alternatives   Prep: site scrubbed/prepped with acetone   Debridement needed: Debridement not done. Location:  face Number of lesions:  Multiple (> 15) Type of treatment:  Red light Aminolevulinic Acid (see MAR for details): Ameluz Aminolevulinic Acid comment:  [id number] Amount of Ameluz (mg):  1 Incubation time (minutes):  60 Number of minutes under lamp:  20 Cooling:  Fan Outcome: patient tolerated procedure well with no complications   Post-procedure details: sunscreen applied and aftercare instructions given to patient    Related Medications Aminolevulinic Acid HCl 10 % GEL 2,000 mg  [place] [facility]  I personally debrided area prior to application of aminolevulinic acid  Documentation: I have reviewed the above documentation for accuracy and completeness, and I agree with the above.  ASC-NURSE ROOM

## 2023-09-12 NOTE — Patient Instructions (Signed)

## 2023-09-13 ENCOUNTER — Encounter: Payer: Self-pay | Admitting: Family Medicine

## 2023-09-13 ENCOUNTER — Other Ambulatory Visit: Payer: Self-pay | Admitting: Family Medicine

## 2023-09-13 DIAGNOSIS — E875 Hyperkalemia: Secondary | ICD-10-CM

## 2023-09-13 LAB — RENAL FUNCTION PANEL
Albumin: 3.6 g/dL — ABNORMAL LOW (ref 3.8–4.8)
BUN/Creatinine Ratio: 14 (ref 10–24)
BUN: 9 mg/dL (ref 8–27)
CO2: 28 mmol/L (ref 20–29)
Calcium: 8.7 mg/dL (ref 8.6–10.2)
Chloride: 95 mmol/L — ABNORMAL LOW (ref 96–106)
Creatinine, Ser: 0.65 mg/dL — ABNORMAL LOW (ref 0.76–1.27)
Glucose: 184 mg/dL — ABNORMAL HIGH (ref 70–99)
Phosphorus: 2.9 mg/dL (ref 2.8–4.1)
Potassium: 3.9 mmol/L (ref 3.5–5.2)
Sodium: 135 mmol/L (ref 134–144)
eGFR: 100 mL/min/{1.73_m2} (ref >59)

## 2023-09-13 MED ORDER — LOKELMA 5 G PO PACK: 5.0000 g | PACK | Freq: Every day | ORAL | 5 refills | Status: DC

## 2023-10-01 ENCOUNTER — Other Ambulatory Visit: Payer: Self-pay

## 2023-10-01 DIAGNOSIS — D508 Other iron deficiency anemias: Secondary | ICD-10-CM

## 2023-10-02 ENCOUNTER — Encounter: Payer: Self-pay | Admitting: Oncology

## 2023-10-02 ENCOUNTER — Inpatient Hospital Stay (HOSPITAL_BASED_OUTPATIENT_CLINIC_OR_DEPARTMENT_OTHER): Payer: PPO | Admitting: Oncology

## 2023-10-02 ENCOUNTER — Inpatient Hospital Stay: Payer: PPO | Attending: Oncology

## 2023-10-02 VITALS — BP 125/90 | HR 65 | Temp 96.9°F | Resp 16 | Wt 142.5 lb

## 2023-10-02 DIAGNOSIS — Z8249 Family history of ischemic heart disease and other diseases of the circulatory system: Secondary | ICD-10-CM | POA: Diagnosis not present

## 2023-10-02 DIAGNOSIS — I10 Essential (primary) hypertension: Secondary | ICD-10-CM | POA: Diagnosis not present

## 2023-10-02 DIAGNOSIS — Z83719 Family history of colon polyps, unspecified: Secondary | ICD-10-CM | POA: Diagnosis not present

## 2023-10-02 DIAGNOSIS — Z833 Family history of diabetes mellitus: Secondary | ICD-10-CM | POA: Insufficient documentation

## 2023-10-02 DIAGNOSIS — Z801 Family history of malignant neoplasm of trachea, bronchus and lung: Secondary | ICD-10-CM | POA: Diagnosis not present

## 2023-10-02 DIAGNOSIS — D649 Anemia, unspecified: Secondary | ICD-10-CM | POA: Insufficient documentation

## 2023-10-02 DIAGNOSIS — Z8616 Personal history of COVID-19: Secondary | ICD-10-CM | POA: Diagnosis not present

## 2023-10-02 DIAGNOSIS — E119 Type 2 diabetes mellitus without complications: Secondary | ICD-10-CM | POA: Diagnosis not present

## 2023-10-02 DIAGNOSIS — Z87442 Personal history of urinary calculi: Secondary | ICD-10-CM | POA: Diagnosis not present

## 2023-10-02 DIAGNOSIS — R5383 Other fatigue: Secondary | ICD-10-CM | POA: Insufficient documentation

## 2023-10-02 DIAGNOSIS — Z9089 Acquired absence of other organs: Secondary | ICD-10-CM | POA: Insufficient documentation

## 2023-10-02 DIAGNOSIS — Z9049 Acquired absence of other specified parts of digestive tract: Secondary | ICD-10-CM | POA: Insufficient documentation

## 2023-10-02 DIAGNOSIS — Z79899 Other long term (current) drug therapy: Secondary | ICD-10-CM | POA: Insufficient documentation

## 2023-10-02 DIAGNOSIS — Z860100 Personal history of colon polyps, unspecified: Secondary | ICD-10-CM | POA: Diagnosis not present

## 2023-10-02 DIAGNOSIS — Z8719 Personal history of other diseases of the digestive system: Secondary | ICD-10-CM | POA: Diagnosis not present

## 2023-10-02 DIAGNOSIS — D508 Other iron deficiency anemias: Secondary | ICD-10-CM

## 2023-10-02 LAB — CBC WITH DIFFERENTIAL (CANCER CENTER ONLY)
Abs Immature Granulocytes: 0.02 10*3/uL (ref 0.00–0.07)
Basophils Absolute: 0 10*3/uL (ref 0.0–0.1)
Basophils Relative: 1 %
Eosinophils Absolute: 0.1 10*3/uL (ref 0.0–0.5)
Eosinophils Relative: 3 %
HCT: 41.5 % (ref 39.0–52.0)
Hemoglobin: 14.6 g/dL (ref 13.0–17.0)
Immature Granulocytes: 0 %
Lymphocytes Relative: 17 %
Lymphs Abs: 0.8 10*3/uL (ref 0.7–4.0)
MCH: 33 pg (ref 26.0–34.0)
MCHC: 35.2 g/dL (ref 30.0–36.0)
MCV: 93.9 fL (ref 80.0–100.0)
Monocytes Absolute: 0.7 10*3/uL (ref 0.1–1.0)
Monocytes Relative: 14 %
Neutro Abs: 3.2 10*3/uL (ref 1.7–7.7)
Neutrophils Relative %: 65 %
Platelet Count: 148 10*3/uL — ABNORMAL LOW (ref 150–400)
RBC: 4.42 MIL/uL (ref 4.22–5.81)
RDW: 14.4 % (ref 11.5–15.5)
WBC Count: 4.9 10*3/uL (ref 4.0–10.5)
nRBC: 0 % (ref 0.0–0.2)

## 2023-10-02 LAB — IRON AND TIBC
Iron: 144 ug/dL (ref 45–182)
Saturation Ratios: 40 % — ABNORMAL HIGH (ref 17.9–39.5)
TIBC: 358 ug/dL (ref 250–450)
UIBC: 214 ug/dL

## 2023-10-02 LAB — FERRITIN: Ferritin: 99 ng/mL (ref 24–336)

## 2023-10-02 NOTE — Progress Notes (Signed)
 Patient denies new or acute problems/concerns today.

## 2023-10-05 ENCOUNTER — Encounter: Payer: Self-pay | Admitting: Oncology

## 2023-10-05 NOTE — Progress Notes (Signed)
 Hematology/Oncology Consult note [facility]  [phone] [phone]  Patient Care Team: Malva Limes, MD as PCP - General (Family Medicine) Midge Minium, MD as Consulting Physician (Gastroenterology) Cherre Huger, DC as Referring Physician (Chiropractic Medicine) Pcp, No Pa, [facility] (Optometry) Creig Hines, MD as Consulting Physician (Oncology)   Name of the patient: [name]  [id number]  December 14, 1950   Date of visit: 10/05/23  Diagnosis-anemia of chronic disease  Chief complaint/ Reason for visit-routine follow-up of anemia  Heme/Onc history: Patient is a 73 year old male with a past medical history significant for multiple medical issues including hypertension depression, cirrhosis of the liver referred for iron deficiency anemia.  CBC from 03/13/2023 showed an H&H of 11.7/34.6 with an MCV of 93.  White count and platelets were normal.  B12 levels were elevated at 1311.  Folate normal at 12.8.  No recent iron studies have been checked.   Results of blood work from 03/19/2023 were as follows: CBC showed H&H of 11.7/34.6.  Both kappa and lambda free light chains were elevated with a normal free light chain ratio 1.33.  Myeloma panel did not show any evidence of M protein on SPEP but a small amount of IgG lambda protein was detected on immunofixation.  Haptoglobin normal.  Reticulocyte count was mildly low for the degree of anemia.  Iron studies were normal including an iron saturation of 33% and a ferritin level of 63.  LDH normal at 119.  Interval history-patient presently reports doing well and denies any specific complaints at this time other than baseline fatigue  ECOG PS- 1 Pain scale- 0   Review of systems- Review of Systems  Constitutional:  Positive for malaise/fatigue. Negative for chills, fever and weight loss.  HENT:  Negative for congestion, ear discharge and nosebleeds.   Eyes:  Negative for blurred vision.   Respiratory:  Negative for cough, hemoptysis, sputum production, shortness of breath and wheezing.   Cardiovascular:  Negative for chest pain, palpitations, orthopnea and claudication.  Gastrointestinal:  Negative for abdominal pain, blood in stool, constipation, diarrhea, heartburn, melena, nausea and vomiting.  Genitourinary:  Negative for dysuria, flank pain, frequency, hematuria and urgency.  Musculoskeletal:  Negative for back pain, joint pain and myalgias.  Skin:  Negative for rash.  Neurological:  Negative for dizziness, tingling, focal weakness, seizures, weakness and headaches.  Endo/Heme/Allergies:  Does not bruise/bleed easily.  Psychiatric/Behavioral:  Negative for depression and suicidal ideas. The patient does not have insomnia.       Allergies  Allergen Reactions   Hydrochlorothiazide Other (See Comments)    Reaction:  Hyponatremia     Past Medical History:  Diagnosis Date   Alcohol abuse    Alcoholism (HCC)    Anxiety    Aortic atherosclerosis (HCC)    Arthritis    Ascites    Cancer (HCC)    Cirrhosis with alcoholism (HCC)    Colon polyp    COVID-19    DDD (degenerative disc disease), cervical    Depression    DM (diabetes mellitus), type 2 (HCC)    Dyspnea    ED (erectile dysfunction)    Elevated liver enzymes    Esophageal varices (HCC)    Esophagitis    Gastric catarrh    Gastric varices    Gastritis    GERD (gastroesophageal reflux disease)    Gout    H. pylori infection    H/O gynecomastia    Hearing loss in right  ear    History of kidney stones    History of portal hypertension    Hyperkalemia    Hyperlipidemia    Hypertension    Hyponatremia    IDA (iron deficiency anemia)    Lipodystrophy    Obesity    Odynophagia    Pancreatitis    Refusal of blood transfusions as patient is Jehovah's Witness    Right inguinal hernia    Seizure (HCC)    as a child-age 24-none since-happened from fall as a child   Splenic vein thrombosis     Splenomegaly    Ventral hernia    Vitamin D deficiency      Past Surgical History:  Procedure Laterality Date   CHOLECYSTECTOMY  2006   COLONOSCOPY WITH ESOPHAGOGASTRODUODENOSCOPY (EGD)     COLONOSCOPY WITH PROPOFOL N/A 11/21/2015   Procedure: COLONOSCOPY WITH PROPOFOL;  Surgeon: Scot Jun, MD;  Location: [facility] ENDOSCOPY;  Service: Endoscopy;  Laterality: N/A;   ESOPHAGOGASTRODUODENOSCOPY (EGD) WITH PROPOFOL N/A 08/21/2019   Procedure: ESOPHAGOGASTRODUODENOSCOPY (EGD) WITH PROPOFOL;  Surgeon: Midge Minium, MD;  Location: [facility] ENDOSCOPY;  Service: Endoscopy;  Laterality: N/A;   HERNIA REPAIR     Inguinal Hernia Repai. Left: 07/28/2013 Dr. Egbert Garibaldi; 2nd repair done 04/30/2014   INSERTION OF MESH N/A 04/10/2023   Procedure: INSERTION OF MESH;  Surgeon: Campbell Lerner, MD;  Location: [facility] ORS;  Service: General;  Laterality: N/A;   TONSILLECTOMY     XI ROBOTIC ASSISTED VENTRAL HERNIA N/A 04/10/2023   Procedure: XI ROBOTIC ASSISTED VENTRAL HERNIA;  Surgeon: Campbell Lerner, MD;  Location: [facility] ORS;  Service: General;  Laterality: N/A;    Social History   Socioeconomic History   Marital status: Married    Spouse name: Candy   Number of children: 3   Years of education: Not on file   Highest education level: Some college, no degree  Occupational History   Occupation: retired  Tobacco Use   Smoking status: Never   Smokeless tobacco: Never  Vaping Use   Vaping status: Never Used  Substance and Sexual Activity   Alcohol use: Not Currently    Comment: previous heavy drinker-quit in 2023   Drug use: No   Sexual activity: Not Currently  Other Topics Concern   Not on file  Social History Narrative   Not on file   Social Drivers of Health   Financial Resource Strain: Low Risk  (07/30/2023)   Overall Financial Resource Strain (CARDIA)    Difficulty of Paying Living Expenses: Not hard at all  Food Insecurity: No Food Insecurity (07/30/2023)   Hunger Vital Sign    Worried About  Running Out of Food in the Last Year: Never true    Ran Out of Food in the Last Year: Never true  Transportation Needs: No Transportation Needs (07/30/2023)   PRAPARE - Administrator, Civil Service (Medical): No    Lack of Transportation (Non-Medical): No  Physical Activity: Insufficiently Active (07/30/2023)   Exercise Vital Sign    Days of Exercise per Week: 3 days    Minutes of Exercise per Session: 10 min  Stress: No Stress Concern Present (07/30/2023)   [employer] of Occupational Health - Occupational Stress Questionnaire    Feeling of Stress : Only a little  Social Connections: Socially Integrated (07/30/2023)   Social Connection and Isolation Panel [NHANES]    Frequency of Communication with Friends and Family: Three times a week    Frequency of Social Gatherings with Friends and  Family: Twice a week    Attends Religious Services: More than 4 times per year    Active Member of Clubs or Organizations: Yes    Attends Banker Meetings: More than 4 times per year    Marital Status: Married  Catering manager Violence: Not At Risk (03/19/2023)   Humiliation, Afraid, Rape, and Kick questionnaire    Fear of Current or Ex-Partner: No    Emotionally Abused: No    Physically Abused: No    Sexually Abused: No    Family History  Problem Relation Age of Onset   Colon polyps Father    Lung cancer Father    Hypertension Father    Dementia Mother    Hypertension Brother    Heart Problems Brother    Hypertension Brother    Diabetes Brother        type 2   Congestive Heart Failure Brother      Current Outpatient Medications:    Accu-Chek Softclix Lancets lancets, Use to check glucose ac and as needed, Disp: 100 each, Rfl: 12   acetaminophen (TYLENOL) 500 MG tablet, Take 1,000 mg by mouth every 6 (six) hours as needed., Disp: , Rfl:    allopurinol (ZYLOPRIM) 100 MG tablet, TAKE 1 TABLET BY MOUTH EVERY DAY, Disp: 90 tablet, Rfl: 4   B-D UF III MINI PEN  NEEDLES 31G X 5 MM MISC, USE 1 TO CHECK BLOOD SUGAR 3 TIMES DAILY, Disp: 100 each, Rfl: 4   carvedilol (COREG) 3.125 MG tablet, Take 1 tablet (3.125 mg total) by mouth 2 (two) times daily., Disp: 180 tablet, Rfl: 0   Continuous Blood Gluc Receiver (FREESTYLE LIBRE 14 DAY READER) DEVI, 1 Device by Does not apply route every 14 (fourteen) days. for type insulin dependent type 2 diabetes, Disp: 1 each, Rfl: 12   Continuous Blood Gluc Sensor (FREESTYLE LIBRE 14 DAY SENSOR) MISC, Place 1 Device onto the skin every 14 (fourteen) days. for insulin dependent type 2 diabetes, Disp: 2 each, Rfl: 12   eplerenone (INSPRA) 25 MG tablet, TAKE 1 TABLET BY MOUTH TWICE A DAY, Disp: 60 tablet, Rfl: 5   furosemide (LASIX) 20 MG tablet, Take 2 tablets (40 mg total) by mouth 2 (two) times daily., Disp: 360 tablet, Rfl: 1   insulin degludec (TRESIBA FLEXTOUCH) 100 UNIT/ML FlexTouch Pen, INJECT UP TO 20 UNITS UNDER THE SKIN EVERY DAY AT BEDTIME. NEED APPT FOR REFILLS., Disp: 15 mL, Rfl: 1   lisinopril (ZESTRIL) 5 MG tablet, Take 5 mg by mouth daily., Disp: , Rfl:    magnesium gluconate (MAGONATE) 500 MG tablet, Take 500 mg by mouth daily. , Disp: , Rfl:    Multiple Vitamin (MULTIVITAMIN WITH MINERALS) TABS tablet, Take 1 tablet by mouth daily., Disp: , Rfl:    mupirocin ointment (BACTROBAN) 2 %, Apply 1 Application topically daily. qd to open sores on scalp, Disp: 22 g, Rfl: 2   niacin 500 MG tablet, Take 500 mg by mouth at bedtime., Disp: , Rfl:    NOVOLOG FLEXPEN 100 UNIT/ML FlexPen, INJECT 20 UNITS UP TO THREE TIMES A DAY BEFORE MEALS. (Patient taking differently: 5-20 Units 3 (three) times daily with meals. Inject 20 units up to three times a day before meals. SS), Disp: 45 mL, Rfl: 0   omeprazole (PRILOSEC) 40 MG capsule, TAKE 1 CAPSULE BY MOUTH EVERY DAY (Patient taking differently: 40 mg every morning. TAKE 1 CAPSULE BY MOUTH EVERY DAY), Disp: 90 capsule, Rfl: 1   ONETOUCH VERIO test  strip, USE TO CHECK SUGAR THREE  TIMES DAILY FOR INSULIN DEPENDENT TYPE 2 DIABETES, Disp: 100 strip, Rfl: 4   oxyCODONE (OXY IR/ROXICODONE) 5 MG immediate release tablet, Take 1 tablet (5 mg total) by mouth every 6 (six) hours as needed for severe pain., Disp: 15 tablet, Rfl: 0   promethazine (PHENERGAN) 25 MG tablet, TAKE 1 TABLET BY MOUTH EVERY 8 HOURS AS NEEDED FOR NAUSEA OR VOMITING., Disp: 20 tablet, Rfl: 3   pyridOXINE (VITAMIN B-6) 50 MG tablet, Take 50 mg by mouth daily., Disp: , Rfl:    sertraline (ZOLOFT) 100 MG tablet, TAKE 1 TABLET BY MOUTH EVERY DAY, Disp: 90 tablet, Rfl: 4   sodium zirconium cyclosilicate (LOKELMA) 5 g packet, Take 5 g by mouth daily., Disp: 30 packet, Rfl: 5   spironolactone (ALDACTONE) 100 MG tablet, Take 100 mg by mouth daily., Disp: , Rfl:    traMADol (ULTRAM) 50 MG tablet, TAKE 1 TABLET BY MOUTH EVERY 8 HOURS AS NEEDED., Disp: 15 tablet, Rfl: 2   zinc gluconate 50 MG tablet, Take 50 mg by mouth daily., Disp: , Rfl:   Physical exam:  Vitals:   10/02/23 1300  BP: (!) 125/90  Pulse: 65  Resp: 16  Temp: (!) 96.9 F (36.1 C)  TempSrc: Tympanic  Weight: 142 lb 8 oz (64.6 kg)   Physical Exam Cardiovascular:     Rate and Rhythm: Normal rate and regular rhythm.     Heart sounds: Normal heart sounds.  Pulmonary:     Effort: Pulmonary effort is normal.     Breath sounds: Normal breath sounds.  Skin:    General: Skin is warm and dry.  Neurological:     Mental Status: He is alert and oriented to person, place, and time.         Latest Ref Rng & Units 09/12/2023    1:09 PM  CMP  Glucose 70 - 99 mg/dL 161   BUN 8 - 27 mg/dL 9   Creatinine 0.96 - 0.45 mg/dL 4.09   Sodium 811 - 914 mmol/L 135   Potassium 3.5 - 5.2 mmol/L 3.9   Chloride 96 - 106 mmol/L 95   CO2 20 - 29 mmol/L 28   Calcium 8.6 - 10.2 mg/dL 8.7       Latest Ref Rng & Units 10/02/2023   12:53 PM  CBC  WBC 4.0 - 10.5 K/uL 4.9   Hemoglobin 13.0 - 17.0 g/dL 78.2   Hematocrit 95.6 - 52.0 % 41.5   Platelets 150 - 400  K/uL 148      Assessment and plan- Patient is a 73 y.o. male here for routine follow-up of normocytic anemia likely anemia of Chronic disease  Patient's hemoglobin back in April 2024 was 14.  Drifted down to 11.5 between September to December 2024.  Results of anemia workup were otherwise unremarkable.  Myeloma panel did not show any evidence of M protein on SPEP although small amount of IgG lambda M protein was detected on immunofixation.  Both kappa and lambda light chains were elevated with a normal free light chain ratio.  Today his hemoglobin is back up to 14.6.  Ferritin and iron studies are presently within normal limits.  Patient can continue to follow-up with his primary care doctor and can be referred to [place] in the future if questions or concerns arise if his anemia worsens   Visit Diagnosis 1. Normocytic anemia      Dr. Owens Shark, MD, MPH [facility] at [facility] [phone] 10/05/2023 1:49 PM

## 2023-10-07 ENCOUNTER — Other Ambulatory Visit: Payer: Self-pay | Admitting: Family Medicine

## 2023-10-07 DIAGNOSIS — Z794 Long term (current) use of insulin: Secondary | ICD-10-CM

## 2023-10-10 ENCOUNTER — Other Ambulatory Visit: Payer: Self-pay | Admitting: Family Medicine

## 2023-10-28 ENCOUNTER — Encounter: Payer: Self-pay | Admitting: Intensive Care

## 2023-10-28 ENCOUNTER — Emergency Department

## 2023-10-28 ENCOUNTER — Other Ambulatory Visit: Payer: Self-pay

## 2023-10-28 ENCOUNTER — Inpatient Hospital Stay

## 2023-10-28 ENCOUNTER — Inpatient Hospital Stay
Admission: EM | Admit: 2023-10-28 | Discharge: 2023-10-31 | DRG: 871 | Discharge status: Home / Self Care | Attending: Internal Medicine | Admitting: Internal Medicine

## 2023-10-28 DIAGNOSIS — J690 Pneumonitis due to inhalation of food and vomit: Secondary | ICD-10-CM | POA: Diagnosis present

## 2023-10-28 DIAGNOSIS — G9341 Metabolic encephalopathy: Secondary | ICD-10-CM | POA: Diagnosis present

## 2023-10-28 DIAGNOSIS — R6520 Severe sepsis without septic shock: Secondary | ICD-10-CM | POA: Diagnosis present

## 2023-10-28 DIAGNOSIS — K7031 Alcoholic cirrhosis of liver with ascites: Secondary | ICD-10-CM | POA: Diagnosis present

## 2023-10-28 DIAGNOSIS — A419 Sepsis, unspecified organism: Secondary | ICD-10-CM | POA: Diagnosis present

## 2023-10-28 DIAGNOSIS — J189 Pneumonia, unspecified organism: Principal | ICD-10-CM

## 2023-10-28 DIAGNOSIS — R748 Abnormal levels of other serum enzymes: Secondary | ICD-10-CM

## 2023-10-28 DIAGNOSIS — Z87891 Personal history of nicotine dependence: Secondary | ICD-10-CM

## 2023-10-28 DIAGNOSIS — I63549 Cerebral infarction due to unspecified occlusion or stenosis of unspecified cerebellar artery: Secondary | ICD-10-CM | POA: Diagnosis not present

## 2023-10-28 DIAGNOSIS — I959 Hypotension, unspecified: Secondary | ICD-10-CM | POA: Diagnosis not present

## 2023-10-28 DIAGNOSIS — F10139 Alcohol abuse with withdrawal, unspecified: Secondary | ICD-10-CM | POA: Diagnosis not present

## 2023-10-28 DIAGNOSIS — R161 Splenomegaly, not elsewhere classified: Secondary | ICD-10-CM | POA: Diagnosis not present

## 2023-10-28 DIAGNOSIS — I2510 Atherosclerotic heart disease of native coronary artery without angina pectoris: Secondary | ICD-10-CM | POA: Diagnosis present

## 2023-10-28 DIAGNOSIS — R404 Transient alteration of awareness: Secondary | ICD-10-CM | POA: Diagnosis not present

## 2023-10-28 DIAGNOSIS — R9089 Other abnormal findings on diagnostic imaging of central nervous system: Secondary | ICD-10-CM | POA: Diagnosis not present

## 2023-10-28 DIAGNOSIS — E872 Acidosis, unspecified: Secondary | ICD-10-CM | POA: Diagnosis present

## 2023-10-28 DIAGNOSIS — R188 Other ascites: Secondary | ICD-10-CM | POA: Diagnosis not present

## 2023-10-28 DIAGNOSIS — I614 Nontraumatic intracerebral hemorrhage in cerebellum: Secondary | ICD-10-CM | POA: Diagnosis not present

## 2023-10-28 DIAGNOSIS — E785 Hyperlipidemia, unspecified: Secondary | ICD-10-CM | POA: Diagnosis present

## 2023-10-28 DIAGNOSIS — M6282 Rhabdomyolysis: Secondary | ICD-10-CM | POA: Diagnosis present

## 2023-10-28 DIAGNOSIS — Z87820 Personal history of traumatic brain injury: Secondary | ICD-10-CM

## 2023-10-28 DIAGNOSIS — G9340 Encephalopathy, unspecified: Secondary | ICD-10-CM | POA: Diagnosis not present

## 2023-10-28 DIAGNOSIS — F10930 Alcohol use, unspecified with withdrawal, uncomplicated: Secondary | ICD-10-CM | POA: Diagnosis not present

## 2023-10-28 DIAGNOSIS — K7011 Alcoholic hepatitis with ascites: Secondary | ICD-10-CM | POA: Diagnosis not present

## 2023-10-28 DIAGNOSIS — W19XXXA Unspecified fall, initial encounter: Secondary | ICD-10-CM | POA: Diagnosis not present

## 2023-10-28 DIAGNOSIS — E1169 Type 2 diabetes mellitus with other specified complication: Secondary | ICD-10-CM | POA: Diagnosis not present

## 2023-10-28 DIAGNOSIS — R4182 Altered mental status, unspecified: Secondary | ICD-10-CM | POA: Diagnosis not present

## 2023-10-28 DIAGNOSIS — I639 Cerebral infarction, unspecified: Secondary | ICD-10-CM | POA: Diagnosis not present

## 2023-10-28 DIAGNOSIS — R569 Unspecified convulsions: Secondary | ICD-10-CM | POA: Diagnosis not present

## 2023-10-28 DIAGNOSIS — R0689 Other abnormalities of breathing: Secondary | ICD-10-CM | POA: Diagnosis not present

## 2023-10-28 DIAGNOSIS — R918 Other nonspecific abnormal finding of lung field: Secondary | ICD-10-CM | POA: Diagnosis not present

## 2023-10-28 DIAGNOSIS — F1120 Opioid dependence, uncomplicated: Secondary | ICD-10-CM | POA: Diagnosis not present

## 2023-10-28 DIAGNOSIS — R29701 NIHSS score 1: Secondary | ICD-10-CM | POA: Diagnosis present

## 2023-10-28 DIAGNOSIS — G894 Chronic pain syndrome: Secondary | ICD-10-CM | POA: Diagnosis present

## 2023-10-28 DIAGNOSIS — G0491 Myelitis, unspecified: Secondary | ICD-10-CM | POA: Diagnosis not present

## 2023-10-28 DIAGNOSIS — Z888 Allergy status to other drugs, medicaments and biological substances status: Secondary | ICD-10-CM

## 2023-10-28 DIAGNOSIS — L03116 Cellulitis of left lower limb: Secondary | ICD-10-CM | POA: Diagnosis not present

## 2023-10-28 DIAGNOSIS — Z794 Long term (current) use of insulin: Secondary | ICD-10-CM | POA: Diagnosis not present

## 2023-10-28 DIAGNOSIS — J984 Other disorders of lung: Secondary | ICD-10-CM | POA: Diagnosis not present

## 2023-10-28 DIAGNOSIS — R000 Tachycardia, unspecified: Secondary | ICD-10-CM | POA: Diagnosis not present

## 2023-10-28 DIAGNOSIS — I6782 Cerebral ischemia: Secondary | ICD-10-CM | POA: Diagnosis not present

## 2023-10-28 DIAGNOSIS — I10 Essential (primary) hypertension: Secondary | ICD-10-CM | POA: Diagnosis not present

## 2023-10-28 DIAGNOSIS — D649 Anemia, unspecified: Secondary | ICD-10-CM | POA: Diagnosis not present

## 2023-10-28 DIAGNOSIS — I6389 Other cerebral infarction: Secondary | ICD-10-CM | POA: Diagnosis not present

## 2023-10-28 DIAGNOSIS — E119 Type 2 diabetes mellitus without complications: Secondary | ICD-10-CM | POA: Diagnosis present

## 2023-10-28 DIAGNOSIS — M7652 Patellar tendinitis, left knee: Secondary | ICD-10-CM | POA: Diagnosis not present

## 2023-10-28 DIAGNOSIS — N200 Calculus of kidney: Secondary | ICD-10-CM | POA: Diagnosis not present

## 2023-10-28 DIAGNOSIS — R471 Dysarthria and anarthria: Secondary | ICD-10-CM | POA: Diagnosis present

## 2023-10-28 DIAGNOSIS — K219 Gastro-esophageal reflux disease without esophagitis: Secondary | ICD-10-CM | POA: Diagnosis not present

## 2023-10-28 DIAGNOSIS — G40909 Epilepsy, unspecified, not intractable, without status epilepticus: Secondary | ICD-10-CM | POA: Diagnosis not present

## 2023-10-28 DIAGNOSIS — M25562 Pain in left knee: Secondary | ICD-10-CM | POA: Diagnosis not present

## 2023-10-28 DIAGNOSIS — G40509 Epileptic seizures related to external causes, not intractable, without status epilepticus: Secondary | ICD-10-CM | POA: Diagnosis present

## 2023-10-28 DIAGNOSIS — R29818 Other symptoms and signs involving the nervous system: Secondary | ICD-10-CM | POA: Diagnosis not present

## 2023-10-28 DIAGNOSIS — M1712 Unilateral primary osteoarthritis, left knee: Secondary | ICD-10-CM | POA: Diagnosis not present

## 2023-10-28 DIAGNOSIS — E876 Hypokalemia: Secondary | ICD-10-CM | POA: Diagnosis not present

## 2023-10-28 DIAGNOSIS — F1090 Alcohol use, unspecified, uncomplicated: Secondary | ICD-10-CM | POA: Diagnosis not present

## 2023-10-28 DIAGNOSIS — F32A Depression, unspecified: Secondary | ICD-10-CM | POA: Diagnosis not present

## 2023-10-28 DIAGNOSIS — M810 Age-related osteoporosis without current pathological fracture: Secondary | ICD-10-CM | POA: Diagnosis not present

## 2023-10-28 DIAGNOSIS — I63231 Cerebral infarction due to unspecified occlusion or stenosis of right carotid arteries: Secondary | ICD-10-CM | POA: Diagnosis not present

## 2023-10-28 DIAGNOSIS — J440 Chronic obstructive pulmonary disease with acute lower respiratory infection: Secondary | ICD-10-CM | POA: Diagnosis not present

## 2023-10-28 HISTORY — DX: Alcohol abuse, uncomplicated: F10.10

## 2023-10-28 HISTORY — DX: Sepsis, unspecified organism: R65.20

## 2023-10-28 HISTORY — DX: Unspecified convulsions: R56.9

## 2023-10-28 HISTORY — DX: Pneumonitis due to inhalation of food and vomit: J69.0

## 2023-10-28 LAB — COMPREHENSIVE METABOLIC PANEL WITH GFR
ALT: 47 U/L — ABNORMAL HIGH (ref 0–44)
AST: 132 U/L — ABNORMAL HIGH (ref 15–41)
Albumin: 3.2 g/dL — ABNORMAL LOW (ref 3.5–5.0)
Alkaline Phosphatase: 178 U/L — ABNORMAL HIGH (ref 38–126)
Anion gap: 14 (ref 5–15)
BUN: 25 mg/dL — ABNORMAL HIGH (ref 8–23)
CO2: 25 mmol/L (ref 22–32)
Calcium: 8.1 mg/dL — ABNORMAL LOW (ref 8.9–10.3)
Chloride: 98 mmol/L (ref 98–111)
Creatinine, Ser: 0.97 mg/dL (ref 0.61–1.24)
GFR, Estimated: >60 mL/min (ref >60)
Glucose, Bld: 87 mg/dL (ref 70–99)
Potassium: 3.1 mmol/L — ABNORMAL LOW (ref 3.5–5.1)
Sodium: 137 mmol/L (ref 135–145)
Total Bilirubin: 3.7 mg/dL — ABNORMAL HIGH (ref 0.0–1.2)
Total Protein: 6.3 g/dL — ABNORMAL LOW (ref 6.5–8.1)

## 2023-10-28 LAB — ACETAMINOPHEN LEVEL: Acetaminophen (Tylenol), Serum: <10 ug/mL — ABNORMAL LOW (ref 10–30)

## 2023-10-28 LAB — URINALYSIS, W/ REFLEX TO CULTURE (INFECTION SUSPECTED)
Bilirubin Urine: NEGATIVE
Glucose, UA: NEGATIVE mg/dL
Hgb urine dipstick: NEGATIVE
Ketones, ur: 20 mg/dL — AB
Leukocytes,Ua: NEGATIVE
Nitrite: NEGATIVE
Protein, ur: 100 mg/dL — AB
Specific Gravity, Urine: 1.023 (ref 1.005–1.030)
pH: 5 (ref 5.0–8.0)

## 2023-10-28 LAB — CBC WITH DIFFERENTIAL/PLATELET
Abs Immature Granulocytes: 0.08 10*3/uL — ABNORMAL HIGH (ref 0.00–0.07)
Basophils Absolute: 0.1 10*3/uL (ref 0.0–0.1)
Basophils Relative: 0 %
Eosinophils Absolute: 0.3 10*3/uL (ref 0.0–0.5)
Eosinophils Relative: 1 %
HCT: 43.8 % (ref 39.0–52.0)
Hemoglobin: 16.1 g/dL (ref 13.0–17.0)
Immature Granulocytes: 0 %
Lymphocytes Relative: 3 %
Lymphs Abs: 0.5 10*3/uL — ABNORMAL LOW (ref 0.7–4.0)
MCH: 33.9 pg (ref 26.0–34.0)
MCHC: 36.8 g/dL — ABNORMAL HIGH (ref 30.0–36.0)
MCV: 92.2 fL (ref 80.0–100.0)
Monocytes Absolute: 1.2 10*3/uL — ABNORMAL HIGH (ref 0.1–1.0)
Monocytes Relative: 6 %
Neutro Abs: 17.3 10*3/uL — ABNORMAL HIGH (ref 1.7–7.7)
Neutrophils Relative %: 90 %
Platelets: 141 10*3/uL — ABNORMAL LOW (ref 150–400)
RBC: 4.75 MIL/uL (ref 4.22–5.81)
RDW: 14.2 % (ref 11.5–15.5)
Smear Review: NORMAL
WBC: 19.5 10*3/uL — ABNORMAL HIGH (ref 4.0–10.5)
nRBC: 0 % (ref 0.0–0.2)

## 2023-10-28 LAB — AMMONIA: Ammonia: 27 umol/L (ref 9–35)

## 2023-10-28 LAB — PROTIME-INR
INR: 1.4 — ABNORMAL HIGH (ref 0.8–1.2)
Prothrombin Time: 17 s — ABNORMAL HIGH (ref 11.4–15.2)

## 2023-10-28 LAB — URINE DRUG SCREEN, QUALITATIVE (ARMC ONLY)
Amphetamines, Ur Screen: NOT DETECTED
Barbiturates, Ur Screen: NOT DETECTED
Benzodiazepine, Ur Scrn: NOT DETECTED
Cannabinoid 50 Ng, Ur ~~LOC~~: NOT DETECTED
Cocaine Metabolite,Ur ~~LOC~~: NOT DETECTED
MDMA (Ecstasy)Ur Screen: NOT DETECTED
Methadone Scn, Ur: NOT DETECTED
Opiate, Ur Screen: NOT DETECTED
Phencyclidine (PCP) Ur S: NOT DETECTED
Tricyclic, Ur Screen: NOT DETECTED

## 2023-10-28 LAB — LACTIC ACID, PLASMA
Lactic Acid, Venous: 3.8 mmol/L (ref 0.5–1.9)
Lactic Acid, Venous: 2 mmol/L (ref 0.5–1.9)

## 2023-10-28 LAB — RESP PANEL BY RT-PCR (RSV, FLU A&B, COVID)  RVPGX2
Influenza A by PCR: NEGATIVE
Influenza B by PCR: NEGATIVE
Resp Syncytial Virus by PCR: NEGATIVE
SARS Coronavirus 2 by RT PCR: NEGATIVE

## 2023-10-28 LAB — SALICYLATE LEVEL: Salicylate Lvl: <7 mg/dL — ABNORMAL LOW (ref 7.0–30.0)

## 2023-10-28 LAB — CK: Total CK: 2356 U/L — ABNORMAL HIGH (ref 49–397)

## 2023-10-28 LAB — HIV ANTIBODY (ROUTINE TESTING W REFLEX): HIV Screen 4th Generation wRfx: NONREACTIVE

## 2023-10-28 LAB — ETHANOL: Alcohol, Ethyl (B): <10 mg/dL (ref <10)

## 2023-10-28 LAB — HEMOGLOBIN A1C
Hgb A1c MFr Bld: 5.7 % — ABNORMAL HIGH (ref 4.8–5.6)
Mean Plasma Glucose: 116.89 mg/dL

## 2023-10-28 MED ORDER — LORAZEPAM 2 MG/ML IJ SOLN
1.0000 mg | INTRAMUSCULAR | Status: AC | PRN
  Administered 2023-10-30 (×2): 2 mg via INTRAVENOUS
  Filled 2023-10-28 (×3): qty 1

## 2023-10-28 MED ORDER — THIAMINE HCL 100 MG/ML IJ SOLN
100.0000 mg | Freq: Every day | INTRAMUSCULAR | Status: DC
  Administered 2023-10-31: 100 mg via INTRAVENOUS
  Filled 2023-10-28: qty 2

## 2023-10-28 MED ORDER — SODIUM CHLORIDE 0.9 % IV SOLN
2.0000 g | Freq: Once | INTRAVENOUS | Status: AC
  Administered 2023-10-28: 2 g via INTRAVENOUS
  Filled 2023-10-28: qty 12.5

## 2023-10-28 MED ORDER — HYDROMORPHONE HCL 1 MG/ML IJ SOLN: 0.5000 mg | INTRAMUSCULAR | Status: DC | PRN

## 2023-10-28 MED ORDER — SODIUM CHLORIDE 0.9 % IV SOLN
2.0000 g | INTRAVENOUS | Status: DC
  Administered 2023-10-28 – 2023-10-30 (×3): 2 g via INTRAVENOUS
  Filled 2023-10-28 (×4): qty 20

## 2023-10-28 MED ORDER — POTASSIUM CHLORIDE CRYS ER 20 MEQ PO TBCR
40.0000 meq | EXTENDED_RELEASE_TABLET | Freq: Once | ORAL | Status: AC
  Administered 2023-10-28: 40 meq via ORAL
  Filled 2023-10-28: qty 2

## 2023-10-28 MED ORDER — BISACODYL 5 MG PO TBEC: 5.0000 mg | DELAYED_RELEASE_TABLET | Freq: Every day | ORAL | Status: DC | PRN

## 2023-10-28 MED ORDER — AMPICILLIN-SULBACTAM SODIUM 3 (2-1) G IJ SOLR: 3.0000 g | Freq: Once | INTRAMUSCULAR | Status: DC

## 2023-10-28 MED ORDER — ACETAMINOPHEN 325 MG PO TABS
650.0000 mg | ORAL_TABLET | Freq: Four times a day (QID) | ORAL | Status: DC | PRN
  Administered 2023-10-28: 650 mg via ORAL
  Filled 2023-10-28 (×2): qty 2

## 2023-10-28 MED ORDER — OXYCODONE HCL 5 MG PO TABS
5.0000 mg | ORAL_TABLET | Freq: Four times a day (QID) | ORAL | Status: DC | PRN
  Filled 2023-10-28: qty 1

## 2023-10-28 MED ORDER — HYDRALAZINE HCL 20 MG/ML IJ SOLN: 5.0000 mg | Freq: Four times a day (QID) | INTRAMUSCULAR | Status: DC | PRN

## 2023-10-28 MED ORDER — ENOXAPARIN SODIUM 40 MG/0.4ML IJ SOSY
40.0000 mg | PREFILLED_SYRINGE | INTRAMUSCULAR | Status: DC
  Administered 2023-10-28 – 2023-10-29 (×2): 40 mg via SUBCUTANEOUS
  Filled 2023-10-28 (×2): qty 0.4

## 2023-10-28 MED ORDER — IOHEXOL 300 MG/ML  SOLN
100.0000 mL | Freq: Once | INTRAMUSCULAR | Status: AC | PRN
  Administered 2023-10-28: 100 mL via INTRAVENOUS

## 2023-10-28 MED ORDER — METRONIDAZOLE 500 MG/100ML IV SOLN
500.0000 mg | Freq: Once | INTRAVENOUS | Status: AC
  Administered 2023-10-28: 500 mg via INTRAVENOUS
  Filled 2023-10-28: qty 100

## 2023-10-28 MED ORDER — VANCOMYCIN HCL IN DEXTROSE 1-5 GM/200ML-% IV SOLN
1000.0000 mg | Freq: Once | INTRAVENOUS | Status: AC
  Administered 2023-10-28: 1000 mg via INTRAVENOUS
  Filled 2023-10-28: qty 200

## 2023-10-28 MED ORDER — FOLIC ACID 1 MG PO TABS
1.0000 mg | ORAL_TABLET | Freq: Every day | ORAL | Status: DC
  Administered 2023-10-28 – 2023-10-31 (×4): 1 mg via ORAL
  Filled 2023-10-28 (×4): qty 1

## 2023-10-28 MED ORDER — LORAZEPAM 1 MG PO TABS: 1.0000 mg | ORAL_TABLET | ORAL | Status: AC | PRN

## 2023-10-28 MED ORDER — ACETAMINOPHEN 325 MG RE SUPP
650.0000 mg | Freq: Once | RECTAL | Status: AC
  Administered 2023-10-28: 650 mg via RECTAL
  Filled 2023-10-28: qty 2

## 2023-10-28 MED ORDER — LACTATED RINGERS IV BOLUS (SEPSIS)
1000.0000 mL | Freq: Once | INTRAVENOUS | Status: AC
  Administered 2023-10-28: 1000 mL via INTRAVENOUS

## 2023-10-28 MED ORDER — SODIUM ZIRCONIUM CYCLOSILICATE 5 G PO PACK
5.0000 g | PACK | Freq: Every day | ORAL | Status: DC
  Filled 2023-10-28: qty 1

## 2023-10-28 MED ORDER — SENNOSIDES-DOCUSATE SODIUM 8.6-50 MG PO TABS: 1.0000 | ORAL_TABLET | Freq: Every evening | ORAL | Status: DC | PRN

## 2023-10-28 MED ORDER — LACTATED RINGERS IV BOLUS
1000.0000 mL | Freq: Once | INTRAVENOUS | Status: AC
  Administered 2023-10-28: 1000 mL via INTRAVENOUS

## 2023-10-28 MED ORDER — ONDANSETRON HCL 4 MG/2ML IJ SOLN: 4.0000 mg | Freq: Four times a day (QID) | INTRAMUSCULAR | Status: DC | PRN

## 2023-10-28 MED ORDER — INSULIN ASPART 100 UNIT/ML IJ SOLN
0.0000 [IU] | Freq: Three times a day (TID) | INTRAMUSCULAR | Status: DC
  Administered 2023-10-29 (×2): 3 [IU] via SUBCUTANEOUS
  Administered 2023-10-29: 8 [IU] via SUBCUTANEOUS
  Administered 2023-10-30 (×2): 3 [IU] via SUBCUTANEOUS
  Filled 2023-10-28 (×5): qty 1

## 2023-10-28 MED ORDER — METRONIDAZOLE 500 MG/100ML IV SOLN: 500.0000 mg | Freq: Two times a day (BID) | INTRAVENOUS | Status: DC

## 2023-10-28 MED ORDER — METRONIDAZOLE 500 MG/100ML IV SOLN
500.0000 mg | Freq: Two times a day (BID) | INTRAVENOUS | Status: DC
  Administered 2023-10-28 – 2023-10-29 (×3): 500 mg via INTRAVENOUS
  Filled 2023-10-28 (×5): qty 100

## 2023-10-28 MED ORDER — SERTRALINE HCL 50 MG PO TABS
100.0000 mg | ORAL_TABLET | Freq: Every day | ORAL | Status: DC
  Administered 2023-10-28 – 2023-10-31 (×4): 100 mg via ORAL
  Filled 2023-10-28 (×4): qty 2

## 2023-10-28 MED ORDER — SODIUM CHLORIDE 0.9 % IV SOLN: INTRAVENOUS | Status: AC

## 2023-10-28 MED ORDER — LORAZEPAM 2 MG/ML IJ SOLN
1.0000 mg | Freq: Once | INTRAMUSCULAR | Status: AC
  Administered 2023-10-28: 1 mg via INTRAVENOUS
  Filled 2023-10-28: qty 1

## 2023-10-28 MED ORDER — ALLOPURINOL 100 MG PO TABS
100.0000 mg | ORAL_TABLET | Freq: Every day | ORAL | Status: DC
  Administered 2023-10-29 – 2023-10-31 (×3): 100 mg via ORAL
  Filled 2023-10-28 (×3): qty 1

## 2023-10-28 MED ORDER — ADULT MULTIVITAMIN W/MINERALS CH
1.0000 | ORAL_TABLET | Freq: Every day | ORAL | Status: DC
  Administered 2023-10-28 – 2023-10-31 (×4): 1 via ORAL
  Filled 2023-10-28 (×4): qty 1

## 2023-10-28 MED ORDER — THIAMINE MONONITRATE 100 MG PO TABS
100.0000 mg | ORAL_TABLET | Freq: Every day | ORAL | Status: DC
Start: 2023-10-28 — End: 2023-10-31
  Administered 2023-10-28 – 2023-10-31 (×4): 100 mg via ORAL
  Filled 2023-10-28 (×4): qty 1

## 2023-10-28 MED ORDER — ONDANSETRON HCL 4 MG PO TABS
4.0000 mg | ORAL_TABLET | Freq: Four times a day (QID) | ORAL | Status: DC | PRN
Start: 2023-10-28 — End: 2023-10-31

## 2023-10-28 MED ORDER — ACETAMINOPHEN 650 MG RE SUPP
650.0000 mg | Freq: Four times a day (QID) | RECTAL | Status: DC | PRN
Start: 2023-10-28 — End: 2023-10-31
  Administered 2023-10-30: 650 mg via RECTAL
  Filled 2023-10-28: qty 1

## 2023-10-28 NOTE — ED Notes (Signed)
 Wells, MD, made aware of pt temp 102.6, increased respirations and agitation.

## 2023-10-28 NOTE — ED Triage Notes (Signed)
 Arrived by [facility] EMS from home. Family reported patient saw patient in bathtub last night, snoring and sleeping with no water. Family went over to check on patient this AM and found him still in bathtub with no water. They could not get him out so called 911.  EMS reports seizure while en route that lasted about 15-20 seconds. Snoring afterwards and postictal period of about 5 minutes and then back to being altered.  Baseline ambulatory and A&O x4. At this time patient is disoriented X4. Non ambulatory with EMS.  EMS vitals: 110HR 26RR 100.2oral 110/70 b/p 86CBG  EMS admnistered 500cc bolus

## 2023-10-28 NOTE — ED Provider Notes (Signed)
 [facility] Provider Note    Event Date/Time   First MD Initiated Contact with Patient 10/28/23 1039     (approximate)   History   Altered Mental Status and Seizures   HPI [name] is a 63 y.o. male presenting today for altered mental status.  Patient coming from home after family reportedly saw him in the bathtub last night snoring and sleeping with no water in the tub.  They went over to check on him this morning and he was still in the bathtub.  He was unable to get out and was altered so they called EMS.  EMS reports that he possibly had a seizure lasting approximately 15 to 20 seconds and then was back to snoring and postictal afterwards.  Started to become more alert but still not speaking with anyone.  Patient unable to contribute to any history.  Reportedly has a history of alcohol abuse and seizures.     Physical Exam   Triage Vital Signs: ED Triage Vitals  Encounter Vitals Group     BP      Systolic BP Percentile      Diastolic BP Percentile      Pulse      Resp      Temp      Temp src      SpO2      Weight      Height      Head Circumference      Peak Flow      Pain Score      Pain Loc      Pain Education      Exclude from Growth Chart     Most recent vital signs: Vitals:   10/28/23 1330 10/28/23 1400  BP: 116/69 118/69  Pulse: (!) 104 (!) 109  Resp: (!) 31 (!) 33  Temp:    SpO2: 97% 95%    Physical Exam: I have reviewed the vital signs and nursing notes. General: Awake, alert, no acute distress.  Significantly altered and not speaking Head:  Atraumatic, normocephalic.   ENT:  EOM intact, PERRL. Oral mucosa is pink and moist with no lesions. Neck: Neck is supple with full range of motion, No meningeal signs. Cardiovascular:  RRR, No murmurs. Peripheral pulses palpable and equal bilaterally. Respiratory:  Symmetrical chest wall expansion.  No rhonchi, rales, or wheezes.  Good air movement throughout.  No use of  accessory muscles.   Musculoskeletal:  No cyanosis or edema. Moving extremities with full ROM Abdomen:  Soft, nontender, nondistended. Neuro:  GCS 12 (E4, V1, M6), significantly altered and not speaking on exam.  Looking around the room.  Not following commands but moving all extremities Psych:  Calm, appropriate.   Skin:  Warm, dry, no rash.  Bruise with wound present to left proximal tibia   ED Results / Procedures / Treatments   Labs (all labs ordered are listed, but only abnormal results are displayed) Labs Reviewed  LACTIC ACID, PLASMA - Abnormal; Notable for the following components:      Result Value   Lactic Acid, Venous 3.8 (*)    All other components within normal limits  COMPREHENSIVE METABOLIC PANEL WITH GFR - Abnormal; Notable for the following components:   Potassium 3.1 (*)    BUN 25 (*)    Calcium 8.1 (*)    Total Protein 6.3 (*)    Albumin 3.2 (*)    AST 132 (*)    ALT 47 (*)  Alkaline Phosphatase 178 (*)    Total Bilirubin 3.7 (*)    All other components within normal limits  CBC WITH DIFFERENTIAL/PLATELET - Abnormal; Notable for the following components:   WBC 19.5 (*)    MCHC 36.8 (*)    Platelets 141 (*)    Neutro Abs 17.3 (*)    Lymphs Abs 0.5 (*)    Monocytes Absolute 1.2 (*)    Abs Immature Granulocytes 0.08 (*)    All other components within normal limits  PROTIME-INR - Abnormal; Notable for the following components:   Prothrombin Time 17.0 (*)    INR 1.4 (*)    All other components within normal limits  URINALYSIS, W/ REFLEX TO CULTURE (INFECTION SUSPECTED) - Abnormal; Notable for the following components:   Color, Urine AMBER (*)    APPearance CLEAR (*)    Ketones, ur 20 (*)    Protein, ur 100 (*)    Bacteria, UA FEW (*)    All other components within normal limits  CK - Abnormal; Notable for the following components:   Total CK 2,356 (*)    All other components within normal limits  SALICYLATE LEVEL - Abnormal; Notable for the following  components:   Salicylate Lvl <7.0 (*)    All other components within normal limits  ACETAMINOPHEN  LEVEL - Abnormal; Notable for the following components:   Acetaminophen  (Tylenol ), Serum <10 (*)    All other components within normal limits  RESP PANEL BY RT-PCR (RSV, FLU A&B, COVID)  RVPGX2  CULTURE, BLOOD (ROUTINE X 2)  CULTURE, BLOOD (ROUTINE X 2)  ETHANOL  AMMONIA  URINE DRUG SCREEN, QUALITATIVE ([facility] ONLY)  LACTIC ACID, PLASMA     EKG My EKG interpretation: Rate of 102, sinus tachycardia, no acute ST elevations or depressions   RADIOLOGY Independently interpreted chest x-ray with evidence of multifocal pneumonia.  Independently interpreted CT head with no acute pathology.  Infill interpreted CT abdomen/pelvis with some concern for cirrhosis but no other acute intra-abdominal pathology.   PROCEDURES:  Critical Care performed: Yes, see critical care procedure note(s)  .Critical Care  Performed by: Kandee Orion, MD Authorized by: Kandee Orion, MD   Critical care provider statement:    Critical care time (minutes):  30   Critical care was necessary to treat or prevent imminent or life-threatening deterioration of the following conditions:  Sepsis   Critical care was time spent personally by me on the following activities:  Development of treatment plan with patient or surrogate, discussions with consultants, evaluation of patient's response to treatment, examination of patient, ordering and review of laboratory studies, ordering and review of radiographic studies, ordering and performing treatments and interventions, pulse oximetry, re-evaluation of patient's condition and review of old charts   I assumed direction of critical care for this patient from another provider in my specialty: no     Care discussed with: admitting provider      MEDICATIONS ORDERED IN ED: Medications  LORazepam  (ATIVAN ) tablet 1-4 mg (has no administration in time range)    Or  LORazepam   (ATIVAN ) injection 1-4 mg (has no administration in time range)  thiamine  (VITAMIN B1) tablet 100 mg (has no administration in time range)    Or  thiamine  (VITAMIN B1) injection 100 mg (has no administration in time range)  folic acid  (FOLVITE ) tablet 1 mg (has no administration in time range)  multivitamin with minerals tablet 1 tablet (has no administration in time range)  0.9 %  sodium chloride  infusion (  has no administration in time range)  Ampicillin-Sulbactam (UNASYN) 3 g in sodium chloride  0.9 % 100 mL IVPB (has no administration in time range)  acetaminophen  (TYLENOL ) suppository 650 mg (650 mg Rectal Given 10/28/23 1119)  lactated ringers  bolus 1,000 mL (0 mLs Intravenous Stopped 10/28/23 1221)  ceFEPIme  (MAXIPIME ) 2 g in sodium chloride  0.9 % 100 mL IVPB (0 g Intravenous Stopped 10/28/23 1221)  metroNIDAZOLE  (FLAGYL ) IVPB 500 mg (0 mg Intravenous Stopped 10/28/23 1441)  vancomycin  (VANCOCIN ) IVPB 1000 mg/200 mL premix (0 mg Intravenous Stopped 10/28/23 1441)  lactated ringers  bolus 1,000 mL (0 mLs Intravenous Stopped 10/28/23 1441)  iohexol  (OMNIPAQUE ) 300 MG/ML solution 100 mL (100 mLs Intravenous Contrast Given 10/28/23 1156)  LORazepam  (ATIVAN ) injection 1 mg (1 mg Intravenous Given 10/28/23 1303)     IMPRESSION / MDM / ASSESSMENT AND PLAN / ED COURSE  I reviewed the triage vital signs and the nursing notes.                              Differential diagnosis includes, but is not limited to, seizures, alcohol intoxication, sepsis, pneumonia, UTI, polysubstance use, CVA  Patient's presentation is most consistent with acute presentation with potential threat to life or bodily function.  Patient is a 63 year old male presenting today for altered mental status.  No obvious last known well time.  On arrival he is looking around the room but not speaking or following commands.  No obvious immediate CVA symptoms and no last known normal requiring activation of a stroke alert.  Will evaluate  for CVA versus seizures versus infectious source or other polysubstance use.  Ethanol level normal.  WBC at 19.5.  Elevated lactic acid at 3.8.  Total CK at 2356.  Chest x-ray appears to show multifocal pneumonia which is likely source of his sepsis today.  Was given 2 L fluid and broad-spectrum antibiotics.  CT head and abdomen/pelvis showed no acute pathology.  Given concern that he may be also experiencing some withdrawal symptoms, did give him a dose of 1 mg Ativan  as well.  Patient admitted to hospitalist for sepsis secondary to multifocal pneumonia.  The patient is on the cardiac monitor to evaluate for evidence of arrhythmia and/or significant heart rate changes. Clinical Course as of 10/28/23 1454  Mon Oct 28, 2023  1143 Lactic Acid, Venous(!!): 3.8 Will add 2nd Liter of fluid [DW]    Clinical Course User Index [DW] Kandee Orion, MD     FINAL CLINICAL IMPRESSION(S) / ED DIAGNOSES   Final diagnoses:  Multifocal pneumonia  Sepsis, due to unspecified organism, unspecified whether acute organ dysfunction present (HCC)  Elevated CK     Rx / DC Orders   ED Discharge Orders     None        Note:  This document was prepared using Dragon voice recognition software and may include unintentional dictation errors.   Kandee Orion, MD 10/28/23 [phone]

## 2023-10-28 NOTE — Sepsis Progress Note (Signed)
 Elink will follow per sepsis protocol.

## 2023-10-28 NOTE — ED Notes (Signed)
 James Oyster, MD, made aware of lactic 3.8

## 2023-10-28 NOTE — H&P (Addendum)
History and Physical    [name] UJW:[id number] DOB: 04/09/1961 DOA: 10/28/2023  PCP: Lamon Pillow, MD (Confirm with patient/family/NH records and if not entered, this has to be entered at [facility] point of entry) Patient coming from: Home  I have personally briefly reviewed patient's old medical records in [facility]  Chief Complaint: AMS  HPI: [name] is a 63 y.o. male with medical history significant of alcohol abuse, chronic pain syndrome on chronic narcotic therapy, IDDM, HTN, brought in by family member for evaluation of altered mentations.  Patient is still confused unable to provide any history, all history provided by wife at bedside.  Patient does been drinking, earlier this year he was seen in the alcohol rehab place and remained sober until about 6 weeks ago.  And due to stressful life event patient started to pick up drinking again about 4 weeks ago, last drink was yesterday morning.  Yesterday morning, patient reported feeling nausea and went to bathroom.  Wife heard him had a shower but he remained in the bathroom for prolonged period time. Wife went back to see him in the evening and found him to be lying in the bathtub " snoring heavily and not responding, appears to be sleeping".  Wife herself has had severe knee problems and only able to help him to get out of bathtub.  Wife denied seeing any suspicious seizure-like movement or so any vomitus or diarrhea around him.  This morning, family ember went to check him again and found him to still remain in the postop remain on consciousness and called EMS. EMS reported that the patient had a short course of seizure activity 15-20 seconds, did not need any Ativan  and recovered consciousness symptoms of no tongue biting or loss control bowel movement or urination. ED Course: Febrile temperature 102.6 tachycardia borderline hypotensive not hypoxic.  Chest x-ray and CT chest abdomen pelvis showed bilateral lower field infiltrates  compatible with aspiration pneumonia.  Liver cirrhosis with focal ascites around the liver, sodium 137 potassium 3.1 BUN 25 creatinine 0.9 lactic acid 3.80 ammonia 26, CK20 355, AST 132 ALT 47 bilirubin 3.7.  No CBD dilatation, no signs to suspect acute cholecystitis.  WBC 19.5, hemoglobin 6.1.  Patient was given a total of 2.0 L IV bolus and vancomycin  and cefepime  in the ED.  In the ED, patient is awake and according to wife mentation has improved, able to recognize wife and daughter at bedside and engaged in some simple conversations.  Review of Systems: Unable to perform, patient is still confused.  Past Medical History:  Diagnosis Date   Alcohol abuse    Seizures (HCC)     History reviewed. No pertinent surgical history.   reports current alcohol use. No history on file for tobacco use and drug use.  Not on File  History reviewed. No pertinent family history.   Prior to Admission medications   Not on File    Physical Exam: Vitals:   10/28/23 1239 10/28/23 1300 10/28/23 1330 10/28/23 1400  BP:  (!) 117/90 116/69 118/69  Pulse:  (!) 107 (!) 104 (!) 109  Resp:  (!) 29 (!) 31 (!) 33  Temp: (!) 102.6 F (39.2 C)     TempSrc: Oral     SpO2:  98% 97% 95%  Weight:        Constitutional: NAD, calm, comfortable Vitals:   10/28/23 1239 10/28/23 1300 10/28/23 1330 10/28/23 1400  BP:  (!) 117/90 116/69 118/69  Pulse:  Aaron Aas)  107 (!) 104 (!) 109  Resp:  (!) 29 (!) 31 (!) 33  Temp: (!) 102.6 F (39.2 C)     TempSrc: Oral     SpO2:  98% 97% 95%  Weight:       Eyes: PERRL, lids and conjunctivae normal ENMT: Mucous membranes are moist. Posterior pharynx clear of any exudate or lesions.Normal dentition.  Neck: normal, supple, no masses, no thyromegaly Respiratory: clear to auscultation bilaterally, no wheezing, coarse crackles bilaterally and increasing respiratory effort. No accessory muscle use.  Cardiovascular: Regular rate and rhythm, no murmurs / rubs / gallops. No  extremity edema. 2+ pedal pulses. No carotid bruits.  Abdomen: no tenderness, no masses palpated. No hepatosplenomegaly. Bowel sounds positive.  Musculoskeletal: no clubbing / cyanosis. No joint deformity upper and lower extremities. Good ROM, no contractures. Normal muscle tone.  Skin: Left upper one third of the shin area rash and shallow ulcer Neurologic: CN 2-12 grossly intact. Sensation intact, DTR normal. Strength 5/5 in all 4.  Psychiatric: Normal judgment and insight. Alert and oriented x 3. Normal mood.     Labs on Admission: I have personally reviewed following labs and imaging studies  CBC: Recent Labs  Lab 10/28/23 1114  WBC 19.5*  NEUTROABS 17.3*  HGB 16.1  HCT 43.8  MCV 92.2  PLT 141*   Basic Metabolic Panel: Recent Labs  Lab 10/28/23 1114  NA 137  K 3.1*  CL 98  CO2 25  GLUCOSE 87  BUN 25*  CREATININE 0.97  CALCIUM 8.1*   GFR: CrCl cannot be calculated (Unknown ideal weight.). Liver Function Tests: Recent Labs  Lab 10/28/23 1114  AST 132*  ALT 47*  ALKPHOS 178*  BILITOT 3.7*  PROT 6.3*  ALBUMIN  3.2*   No results for input(s): "LIPASE", "AMYLASE" in the last 168 hours. Recent Labs  Lab 10/28/23 1114  AMMONIA 27   Coagulation Profile: Recent Labs  Lab 10/28/23 1114  INR 1.4*   Cardiac Enzymes: Recent Labs  Lab 10/28/23 1114  CKTOTAL 2,356*   BNP (last 3 results) No results for input(s): "PROBNP" in the last 8760 hours. HbA1C: No results for input(s): "HGBA1C" in the last 72 hours. CBG: No results for input(s): "GLUCAP" in the last 168 hours. Lipid Profile: No results for input(s): "CHOL", "HDL", "LDLCALC", "TRIG", "CHOLHDL", "LDLDIRECT" in the last 72 hours. Thyroid  Function Tests: No results for input(s): "TSH", "T4TOTAL", "FREET4", "T3FREE", "THYROIDAB" in the last 72 hours. Anemia Panel: No results for input(s): "VITAMINB12", "FOLATE", "FERRITIN", "TIBC", "IRON", "RETICCTPCT" in the last 72 hours. Urine analysis:     Component Value Date/Time   COLORURINE AMBER (A) 10/28/2023 1114   APPEARANCEUR CLEAR (A) 10/28/2023 1114   LABSPEC 1.023 10/28/2023 1114   PHURINE 5.0 10/28/2023 1114   GLUCOSEU NEGATIVE 10/28/2023 1114   HGBUR NEGATIVE 10/28/2023 1114   BILIRUBINUR NEGATIVE 10/28/2023 1114   KETONESUR 20 (A) 10/28/2023 1114   PROTEINUR 100 (A) 10/28/2023 1114   NITRITE NEGATIVE 10/28/2023 1114   LEUKOCYTESUR NEGATIVE 10/28/2023 1114    Radiological Exams on Admission: CT ABDOMEN PELVIS W CONTRAST Result Date: 10/28/2023 CLINICAL DATA:  Sepsis. Altered mental status with elevated liver function studies. EXAM: CT ABDOMEN AND PELVIS WITH CONTRAST TECHNIQUE: Multidetector CT imaging of the abdomen and pelvis was performed using the standard protocol following bolus administration of intravenous contrast. RADIATION DOSE REDUCTION: This exam was performed according to the departmental dose-optimization program which includes automated exposure control, adjustment of the mA and/or kV according to patient size and/or use  of iterative reconstruction technique. CONTRAST:  OMNIPAQUE  IOHEXOL  300 MG/ML  SOLN COMPARISON:  None Available. FINDINGS: Technical note: Despite efforts by the technologist and patient, mild motion artifact is present on today's exam and could not be eliminated. This reduces exam sensitivity and specificity. Lower chest: There are patchy airspace opacities at both lung bases, right greater than left, suspicious for aspiration. No significant pleural or pericardial effusion. There are prominent calcifications of the aortic valve with aortic and coronary artery atherosclerosis. Hepatobiliary: Severe hepatic steatosis with contour irregularity of the liver suspicious for cirrhosis. No focally suspicious liver lesions are identified. There are calcifications within the dome of the left hepatic lobe. No significant biliary dilatation status post cholecystectomy. Pancreas: Severe atrophy with scattered  parenchymal calcifications suggesting chronic calcific pancreatitis. No significant pancreatic ductal dilatation or surrounding fluid collection identified. Spleen: Borderline splenomegaly.  No focal abnormality. Adrenals/Urinary Tract: Both adrenal glands appear normal. Punctate nonobstructing calculus in the lower pole of the right kidney. No other evidence of urinary tract calculus, hydronephrosis or perinephric soft tissue stranding. There is no evidence of renal mass. The bladder appears unremarkable for its degree of distention. Stomach/Bowel: No enteric contrast administered. Possible diffuse gastric wall thickening versus incomplete distension. No evidence of bowel distension, wall thickening or surrounding inflammation. Vascular/Lymphatic: Aortic and branch vessel atherosclerosis without evidence of aneurysm or large vessel occlusion. There are venous collaterals in the mid abdomen, likely due to underlying portal hypertension. No evidence of acute venous thrombosis. No enlarged abdominopelvic lymph nodes are identified. Reproductive: Mild enlargement of the prostate gland. Other: Upper abdominal ascites, primarily around the liver. No organized fluid collection or pneumoperitoneum. The abdominal wall appears intact. Musculoskeletal: No acute or significant osseous findings. Advanced degenerative disc disease with grade 1 anterolisthesis and biforaminal narrowing at L4-5. IMPRESSION: 1. Patchy airspace opacities at both lung bases, right greater than left, suspicious for aspiration. 2. Severe hepatic steatosis with contour irregularity of the liver suspicious for cirrhosis. There are venous collaterals in the mid abdomen and borderline splenomegaly, likely due to underlying portal hypertension. 3. Upper abdominal ascites, primarily around the liver. 4. Possible diffuse gastric wall thickening versus incomplete distension. Correlate clinically for gastritis. 5. Punctate nonobstructing right renal calculus.  6. Prominent calcifications of the aortic valve. Aortic atherosclerosis. Electronically Signed   By: Elmon Hagedorn M.D.   On: 10/28/2023 14:07   DG Chest Port 1 View Result Date: 10/28/2023 CLINICAL DATA:  Questionable sepsis.  Altered mental status. EXAM: PORTABLE CHEST 1 VIEW COMPARISON:  None Available. FINDINGS: 1110 hours. The heart size and mediastinal contours are normal. There are patchy airspace opacities in both lungs, right greater than left. No evidence of pneumothorax or significant pleural effusion. The bones appear unremarkable. Telemetry leads overlie the chest. IMPRESSION: Patchy airspace opacities in both lungs, right greater than left, suspicious for multifocal pneumonia. Electronically Signed   By: Elmon Hagedorn M.D.   On: 10/28/2023 13:56   CT Head Wo Contrast Result Date: 10/28/2023 CLINICAL DATA:  Altered mental status, aphasic, possible history of seizures. EXAM: CT HEAD WITHOUT CONTRAST TECHNIQUE: Contiguous axial images were obtained from the base of the skull through the vertex without intravenous contrast. RADIATION DOSE REDUCTION: This exam was performed according to the departmental dose-optimization program which includes automated exposure control, adjustment of the mA and/or kV according to patient size and/or use of iterative reconstruction technique. COMPARISON:  None Available. FINDINGS: Brain: No acute intracranial hemorrhage. No CT evidence of acute infarct. Nonspecific hypoattenuation in the periventricular  and subcortical white matter favored to reflect chronic microvascular ischemic changes. Remote lacunar infarct in the right thalamus. No edema, mass effect, or midline shift. The basilar cisterns are patent. Ventricles: The ventricles are normal. Vascular: Atherosclerotic calcifications of the carotid siphons. No hyperdense vessel. Skull: No acute or aggressive finding. Orbits: Orbits are symmetric. Sinuses: The visualized paranasal sinuses are clear. Other:  Mastoid air cells are clear. IMPRESSION: Examination is slightly limited due to motion artifact. No CT evidence of acute intracranial abnormality. Mild chronic microvascular ischemic changes. Remote lacunar infarct in the right thalamus. Electronically Signed   By: Denny Flack M.D.   On: 10/28/2023 13:09    EKG: Independently reviewed.  Sinus tachycardia, nonspecific ST changes on multiple leads.  Assessment/Plan Principal Problem:   Sepsis (HCC) Active Problems:   Aspiration pneumonia (HCC)   Alcoholic cirrhosis of liver with ascites (HCC)  (please populate well all problems here in Problem List. (For example, if patient is on BP meds at home and you resume or decide to hold them, it is a problem that needs to be her. Same for CAD, COPD, HLD and so on)  Severe sepsis Acute metabolic encephalopathy - Sepsis with acute endorgan damage of acute encephalopathy, sepsis evidenced by new onset fever leukocytosis tachycardia elevated lactate, source of infection likely bilateral aspiration pneumonia.  To a lesser extent left shin area cellulitis - Patient appears to be mild volume contracted but given there is a history of cirrhosis, we will hold off further crystalloid IVF maintenance - Switch antibiotics to ceftriaxone  and Flagyl  to address both aspiration pneumonia and left knee cellulitis.  X-ray to rule out any foreign bodies - Aspiration precaution - Other DDx, no ascites signs on physical exam, CT abdominal pelvis showed only focal ascites, clinically low suspicion for SBP.  No focal neurological deficit, hold off further brain imaging study.  Ammonium level normal, hepatic encephalopathy ruled out.  Acute metabolic encephalopathy Seizure, probably alcohol withdrawal seizure - Clinically suspect alcohol withdrawal seizure - Seizure precaution - CIWA protocol with as needed benzos -Other Ddx, Glucose borderline low, will put him on SSI  Acute, probably on chronic alcoholic hepatitis -  Calculated discriminant function= 12.4, no indication for steroid - CT abdominal pelvis showed no significant other acute hepatobiliary findings. - Manage alcohol withdrawal - Repeat LFT tomorrow  Chronic alcoholic cirrhosis - Mild increase of INR and normal albumin level indicating preserved synthetic function - Ammonia level within normal limits  HTN -BP borderline low, hold off home BP meds till sepsis improve.  IDDM -Hold off Lantus -SSI  Chronic narcotic dependence - Currently there is no symptoms or signs of narcotic withdrawal  Hypokalemia - P.o. replacement  Mild rhabdo myelitis - UA showed negative myoglobin - IVF and trend CK level - On physical exam there is no tenderness on large muscle groups, no signs of compartment syndrome at this point.  DVT prophylaxis: Lovenox  Code Status: Full code Family Communication: Wife and daughter at bedside Disposition Plan: Patient is sick with sepsis secondary to aspiration pneumonia requiring IV antibiotics, complicated at baseline alcohol abuse problems with cirrhosis and acute alcoholic hepatitis requiring close monitoring of LFT's, expect more than 2 midnight hospital stay Consults called: None Admission status: PCU admit   [place] MD [facility] Pager [phone] 10/28/2023, 2:59 PM

## 2023-10-28 NOTE — ED Notes (Signed)
 Pt passed on 2L O2 nasal cannula d/t increased RR and WOB

## 2023-10-28 NOTE — Progress Notes (Signed)
 CODE SEPSIS - PHARMACY COMMUNICATION  **Broad Spectrum Antibiotics should be administered within 1 hour of Sepsis diagnosis**  Time Code Sepsis Called/Page Received: 1103  Antibiotics Ordered:  Cefepime  2g IV x1 Vancomycin  1000 mg IV x1 Metronidazole  500 mg IV x1  Time of 1st antibiotic administration: 1132  Additional action taken by pharmacy: None  If necessary, Name of Provider/Nurse Contacted: None    Ananias Balls ,PharmD Clinical Pharmacist  10/28/2023  11:18 AM

## 2023-10-29 ENCOUNTER — Inpatient Hospital Stay

## 2023-10-29 DIAGNOSIS — K7031 Alcoholic cirrhosis of liver with ascites: Secondary | ICD-10-CM | POA: Diagnosis not present

## 2023-10-29 DIAGNOSIS — J690 Pneumonitis due to inhalation of food and vomit: Secondary | ICD-10-CM | POA: Diagnosis not present

## 2023-10-29 DIAGNOSIS — G9341 Metabolic encephalopathy: Secondary | ICD-10-CM | POA: Diagnosis present

## 2023-10-29 DIAGNOSIS — F10930 Alcohol use, unspecified with withdrawal, uncomplicated: Secondary | ICD-10-CM

## 2023-10-29 DIAGNOSIS — R569 Unspecified convulsions: Secondary | ICD-10-CM

## 2023-10-29 LAB — BLOOD CULTURE ID PANEL (REFLEXED) - BCID2

## 2023-10-29 LAB — GASTROINTESTINAL PANEL BY PCR, STOOL (REPLACES STOOL CULTURE)

## 2023-10-29 LAB — CBC
HCT: 39.2 % (ref 39.0–52.0)
Hemoglobin: 14.2 g/dL (ref 13.0–17.0)
MCH: 34.3 pg — ABNORMAL HIGH (ref 26.0–34.0)
MCHC: 36.2 g/dL — ABNORMAL HIGH (ref 30.0–36.0)
MCV: 94.7 fL (ref 80.0–100.0)
Platelets: 107 10*3/uL — ABNORMAL LOW (ref 150–400)
RBC: 4.14 MIL/uL — ABNORMAL LOW (ref 4.22–5.81)
RDW: 14.5 % (ref 11.5–15.5)
WBC: 13.1 10*3/uL — ABNORMAL HIGH (ref 4.0–10.5)
nRBC: 0 % (ref 0.0–0.2)

## 2023-10-29 LAB — COMPREHENSIVE METABOLIC PANEL WITH GFR
ALT: 63 U/L — ABNORMAL HIGH (ref 0–44)
AST: 181 U/L — ABNORMAL HIGH (ref 15–41)
Albumin: 2.7 g/dL — ABNORMAL LOW (ref 3.5–5.0)
Alkaline Phosphatase: 130 U/L — ABNORMAL HIGH (ref 38–126)
Anion gap: 9 (ref 5–15)
BUN: 23 mg/dL (ref 8–23)
CO2: 21 mmol/L — ABNORMAL LOW (ref 22–32)
Calcium: 7.8 mg/dL — ABNORMAL LOW (ref 8.9–10.3)
Chloride: 100 mmol/L (ref 98–111)
Creatinine, Ser: 0.61 mg/dL (ref 0.61–1.24)
GFR, Estimated: >60 mL/min (ref >60)
Glucose, Bld: 140 mg/dL — ABNORMAL HIGH (ref 70–99)
Potassium: 4 mmol/L (ref 3.5–5.1)
Sodium: 136 mmol/L (ref 135–145)
Total Bilirubin: 3 mg/dL — ABNORMAL HIGH (ref 0.0–1.2)
Total Protein: 5.6 g/dL — ABNORMAL LOW (ref 6.5–8.1)

## 2023-10-29 LAB — GLUCOSE, CAPILLARY
Glucose-Capillary: 183 mg/dL — ABNORMAL HIGH (ref 70–99)
Glucose-Capillary: 269 mg/dL — ABNORMAL HIGH (ref 70–99)
Glucose-Capillary: 203 mg/dL — ABNORMAL HIGH (ref 70–99)

## 2023-10-29 LAB — CK
Total CK: 1847 U/L — ABNORMAL HIGH (ref 49–397)
Total CK: 1574 U/L — ABNORMAL HIGH (ref 49–397)

## 2023-10-29 LAB — C DIFFICILE QUICK SCREEN W PCR REFLEX
C Diff antigen: NEGATIVE
C Diff interpretation: NOT DETECTED
C Diff toxin: NEGATIVE

## 2023-10-29 LAB — CBG MONITORING, ED
Glucose-Capillary: 144 mg/dL — ABNORMAL HIGH (ref 70–99)
Glucose-Capillary: 185 mg/dL — ABNORMAL HIGH (ref 70–99)

## 2023-10-29 LAB — LACTIC ACID, PLASMA: Lactic Acid, Venous: 2.4 mmol/L (ref 0.5–1.9)

## 2023-10-29 MED ORDER — LEVETIRACETAM 500 MG PO TABS
500.0000 mg | ORAL_TABLET | Freq: Two times a day (BID) | ORAL | Status: DC
  Administered 2023-10-29 – 2023-10-30 (×2): 500 mg via ORAL
  Filled 2023-10-29 (×2): qty 1

## 2023-10-29 MED ORDER — SODIUM CHLORIDE 0.9 % IV SOLN: INTRAVENOUS | Status: AC

## 2023-10-29 MED ORDER — LEVETIRACETAM IN NACL 1500 MG/100ML IV SOLN
1500.0000 mg | Freq: Once | INTRAVENOUS | Status: AC
  Administered 2023-10-29: 1500 mg via INTRAVENOUS
  Filled 2023-10-29: qty 100

## 2023-10-29 MED ORDER — DM-GUAIFENESIN ER 30-600 MG PO TB12
1.0000 | ORAL_TABLET | Freq: Two times a day (BID) | ORAL | Status: DC
  Administered 2023-10-29 – 2023-10-31 (×4): 1 via ORAL
  Filled 2023-10-29 (×4): qty 1

## 2023-10-29 MED ORDER — SODIUM CHLORIDE 0.9 % IV SOLN
100.0000 mg | Freq: Two times a day (BID) | INTRAVENOUS | Status: DC
  Administered 2023-10-29 – 2023-10-31 (×5): 100 mg via INTRAVENOUS
  Filled 2023-10-29 (×6): qty 100

## 2023-10-29 NOTE — ED Notes (Signed)
 Patient had one large stool stool occurrence and one large urine occurrence in the bed. Patient cleaned up, chucks pad placed, brief placed on patient and gown changed at this time. Warm blankets provided.

## 2023-10-29 NOTE — Consult Note (Signed)
 NEUROLOGY CONSULT NOTE   Date of service: October 29, 2023 Patient Name: [name] MRN:  [id number] DOB:  12/29/1950 Chief Complaint: "Seizure" Requesting Provider: Montey Apa, DO  History of Present Illness  [name] is a 63 y.o. male with hx of childhood epilepsy who was treated with phenobarbital during childhood, but with no seizures many decades.  He had abrupt onset of confusion yesterday after being found in the bathtub after being in the shower for an unusual amount of time, snoring heavily and not responding.  In transit with EMS, he had a brief 15 to 20-second long seizure but recovered consciousness.  He was febrile and is found to have a multifocal pneumonia.  His seizure was attributed to alcohol withdrawal with subsequent aspiration causing multifocal pneumonia.  He was started on cefepime  and admitted.  His mental status rapidly improved, but is not quite back to baseline.  In workup of his seizure an EEG was obtained and while the tech was obtaining the EEG, the patient became abruptly confused, making unusual comments that seemed markedly different earlier, in the EEG currently displayed periodic abnormal pattern.  For this reason the tech called me and on my review it does appear that the patient had a brief seizure while connected to EEG.  The patient reports that he does not remember the events of yesterday at all  Past History   Past Medical History:  Diagnosis Date   Alcohol abuse    Seizures (HCC)     History reviewed. No pertinent surgical history.  Family History: History reviewed. No pertinent family history.  Social History  reports that he has quit smoking. His smoking use included cigarettes. He has never used smokeless tobacco. He reports current alcohol use. He reports that he does not use drugs.  Allergies  Allergen Reactions   Hydrochlorothiazide Other (See Comments)    Hyponatremia     Medications   Current Facility-Administered  Medications:    0.9 %  sodium chloride  infusion, , Intravenous, Continuous, Montey Apa, DO, Last Rate: 100 mL/hr at 10/29/23 1738, New Bag at 10/29/23 1738   acetaminophen  (TYLENOL ) tablet 650 mg, 650 mg, Oral, Q6H PRN, 650 mg at 10/28/23 1746 **OR** acetaminophen  (TYLENOL ) suppository 650 mg, 650 mg, Rectal, Q6H PRN, [place], MD   allopurinol  (ZYLOPRIM ) tablet 100 mg, 100 mg, Oral, Daily, Jeane Miguel, Ping T, MD, 100 mg at 10/29/23 1006   bisacodyl  (DULCOLAX) EC tablet 5 mg, 5 mg, Oral, Daily PRN, Zhang, Ping T, MD   cefTRIAXone  (ROCEPHIN ) 2 g in sodium chloride  0.9 % 100 mL IVPB, 2 g, Intravenous, Q24H, Antoniette Batty T, MD, Stopped at 10/28/23 2044   dextromethorphan -guaiFENesin  (MUCINEX  DM) 30-600 MG per 12 hr tablet 1 tablet, 1 tablet, Oral, BID, Darus Engels A, DO   doxycycline  (VIBRAMYCIN ) 100 mg in sodium chloride  0.9 % 250 mL IVPB, 100 mg, Intravenous, Q12H, Darus Engels A, DO, Stopped at 10/29/23 1153   enoxaparin  (LOVENOX ) injection 40 mg, 40 mg, Subcutaneous, Q24H, Zhang, Ping T, MD, 40 mg at 10/28/23 2224   folic acid  (FOLVITE ) tablet 1 mg, 1 mg, Oral, Daily, Antoniette Batty T, MD, 1 mg at 10/29/23 4696   hydrALAZINE  (APRESOLINE ) injection 5 mg, 5 mg, Intravenous, Q6H PRN, Antoniette Batty T, MD   HYDROmorphone  (DILAUDID ) injection 0.5-1 mg, 0.5-1 mg, Intravenous, Q2H PRN, Antoniette Batty T, MD   insulin  aspart (novoLOG ) injection 0-15 Units, 0-15 Units, Subcutaneous, TID WC, [place], MD, 8 Units at 10/29/23 1719  levETIRAcetam  (KEPPRA ) tablet 500 mg, 500 mg, Oral, BID, Violette Morneault, Althea Atkinson, MD   LORazepam  (ATIVAN ) tablet 1-4 mg, 1-4 mg, Oral, Q1H PRN **OR** LORazepam  (ATIVAN ) injection 1-4 mg, 1-4 mg, Intravenous, Q1H PRN, Antoniette Batty T, MD   metroNIDAZOLE  (FLAGYL ) IVPB 500 mg, 500 mg, Intravenous, Q12H, Antoniette Batty T, MD, Stopped at 10/29/23 1118   multivitamin with minerals tablet 1 tablet, 1 tablet, Oral, Daily, Antoniette Batty T, MD, 1 tablet at 10/29/23 3474   ondansetron   (ZOFRAN ) tablet 4 mg, 4 mg, Oral, Q6H PRN **OR** ondansetron  (ZOFRAN ) injection 4 mg, 4 mg, Intravenous, Q6H PRN, Antoniette Batty T, MD   oxyCODONE  (Oxy IR/ROXICODONE ) immediate release tablet 5 mg, 5 mg, Oral, Q6H PRN, Antoniette Batty T, MD   senna-docusate (Senokot-S) tablet 1 tablet, 1 tablet, Oral, QHS PRN, Zhang, Ping T, MD   sertraline  (ZOLOFT ) tablet 100 mg, 100 mg, Oral, Daily, Jeane Miguel, Ping T, MD, 100 mg at 10/29/23 2595   thiamine  (VITAMIN B1) tablet 100 mg, 100 mg, Oral, Daily, 100 mg at 10/29/23 0942 **OR** thiamine  (VITAMIN B1) injection 100 mg, 100 mg, Intravenous, Daily, Antoniette Batty T, MD  Vitals   Vitals:   10/29/23 1045 10/29/23 1100 10/29/23 1115 10/29/23 1206  BP:  97/71  113/74  Pulse:    (!) 101  Resp: (!) 23 (!) 29 (!) 21 16  Temp:    98.1 F (36.7 C)  TempSrc:      SpO2:    97%  Weight:      Height:        Body mass index is 22.6 kg/m.  Physical Exam   Constitutional: Appears well-developed and well-nourished.   Neurologic Examination    Neuro: Mental Status: Patient is awake, alert, oriented to person, place, not time.  He has no signs of aphasia or neglect, does not extinguish to double simultaneous stimulation Cranial Nerves: II: Visual Fields are full. Pupils are equal, round, and reactive to light.   III,IV, VI: EOMI without ptosis or diploplia.  V: Facial sensation is symmetric to temperature VII: Facial movement is symmetric.  Motor: He has no drift, orbital sign, but to confrontation he does initially (shortly after the seizure) appear to have a left arm weakness to confrontation but improves on subsequent testing.  He reports pain in his left lower extremity, does have a drift that he attributes to pain. Sensory: Sensation is symmetric to light touch and temperature in the arms and legs. Cerebellar: FNF and HKS are intact bilaterally   Labs/Imaging/Neurodiagnostic studies   CBC:  Recent Labs  Lab 11-23-2023 1114 10/29/23 0518  WBC 19.5* 13.1*   NEUTROABS 17.3*  --   HGB 16.1 14.2  HCT 43.8 39.2  MCV 92.2 94.7  PLT 141* 107*   Basic Metabolic Panel:  Lab Results  Component Value Date   NA 136 10/29/2023   K 4.0 10/29/2023   CO2 21 (L) 10/29/2023   GLUCOSE 140 (H) 10/29/2023   BUN 23 10/29/2023   CREATININE 0.61 10/29/2023   CALCIUM 7.8 (L) 10/29/2023   GFRNONAA >60 10/29/2023   Lipid Panel: No results found for: "LDLCALC" HgbA1c:  Lab Results  Component Value Date   HGBA1C 5.7 (H) 11-23-23   Urine Drug Screen:     Component Value Date/Time   LABOPIA NONE DETECTED 11/23/2023 1114   COCAINSCRNUR NONE DETECTED 23-Nov-2023 1114   LABBENZ NONE DETECTED 11/23/2023 1114   AMPHETMU NONE DETECTED 11/23/23 1114   THCU NONE DETECTED 23-Nov-2023 1114   LABBARB NONE  DETECTED 10/28/2023 1114    Alcohol Level     Component Value Date/Time   ETH <10 10/28/2023 1114   INR  Lab Results  Component Value Date   INR 1.4 (H) 10/28/2023    CT Head without contrast(Personally reviewed): No definite seizure focus  ASSESSMENT   Skylen Spiering is a 63 y.o. male with a history of childhood epilepsy as well as seizure disorder who presents with for seizure in many years.  There was question of alcohol withdrawal, and certainly this could have played some role in lowering seizure threshold, but I do not think that this is solely responsible for his seizure based on his EEG.  I do think he needs to be on antiepileptic therapy and I would favor starting him on Keppra .    RECOMMENDATIONS  Keppra  1500 mg x 1 followed by 500 mg twice daily MRI brain Neurology will continue to follow-up ______________________________________________________________________    Signed, Ann Keto, MD [facility]

## 2023-10-29 NOTE — Procedures (Signed)
 Pt going to 244 which is clean and ready-will get from there

## 2023-10-29 NOTE — ED Notes (Signed)
 OT with pt up to sink in room

## 2023-10-29 NOTE — Progress Notes (Signed)
 PHARMACY - PHYSICIAN COMMUNICATION CRITICAL VALUE ALERT - BLOOD CULTURE IDENTIFICATION (BCID)  [name] is an 63 y.o. male who presented to [facility] on 10/28/2023 with a chief complaint of altered mental status  Assessment:  blood culture from 4/21 with GPC in 1 fo 4 bottles, BCID detects methicillin susceptible S. Epidermidis.    Name of physician (or Provider) Contacted: Dr Antoniette Batty  Current antibiotics: Ceftriaxone  and metronidazole   Changes to prescribed antibiotics recommended:  Patient is on recommended antibiotics - No changes needed.  Blood culture likely contaminant  Results for orders placed or performed during the hospital encounter of 10/28/23  Blood Culture ID Panel (Reflexed) (Collected: 10/28/2023 11:14 AM)  Result Value Ref Range   Enterococcus faecalis NOT DETECTED NOT DETECTED   Enterococcus Faecium NOT DETECTED NOT DETECTED   Listeria monocytogenes NOT DETECTED NOT DETECTED   Staphylococcus species DETECTED (A) NOT DETECTED   Staphylococcus aureus (BCID) NOT DETECTED NOT DETECTED   Staphylococcus epidermidis DETECTED (A) NOT DETECTED   Staphylococcus lugdunensis NOT DETECTED NOT DETECTED   Streptococcus species NOT DETECTED NOT DETECTED   Streptococcus agalactiae NOT DETECTED NOT DETECTED   Streptococcus pneumoniae NOT DETECTED NOT DETECTED   Streptococcus pyogenes NOT DETECTED NOT DETECTED   A.calcoaceticus-baumannii NOT DETECTED NOT DETECTED   Bacteroides fragilis NOT DETECTED NOT DETECTED   Enterobacterales NOT DETECTED NOT DETECTED   Enterobacter cloacae complex NOT DETECTED NOT DETECTED   Escherichia coli NOT DETECTED NOT DETECTED   Klebsiella aerogenes NOT DETECTED NOT DETECTED   Klebsiella oxytoca NOT DETECTED NOT DETECTED   Klebsiella pneumoniae NOT DETECTED NOT DETECTED   Proteus species NOT DETECTED NOT DETECTED   Salmonella species NOT DETECTED NOT DETECTED   Serratia marcescens NOT DETECTED NOT DETECTED   Haemophilus influenzae NOT DETECTED NOT  DETECTED   Neisseria meningitidis NOT DETECTED NOT DETECTED   Pseudomonas aeruginosa NOT DETECTED NOT DETECTED   Stenotrophomonas maltophilia NOT DETECTED NOT DETECTED   Candida albicans NOT DETECTED NOT DETECTED   Candida auris NOT DETECTED NOT DETECTED   Candida glabrata NOT DETECTED NOT DETECTED   Candida krusei NOT DETECTED NOT DETECTED   Candida parapsilosis NOT DETECTED NOT DETECTED   Candida tropicalis NOT DETECTED NOT DETECTED   Cryptococcus neoformans/gattii NOT DETECTED NOT DETECTED   Methicillin resistance mecA/C NOT DETECTED NOT DETECTED    Valma Gazella, PharmD, BCPS, BCIDP Work Cell: [phone] 10/29/2023 8:17 AM

## 2023-10-29 NOTE — Progress Notes (Signed)
 SLP Cancellation Note  Patient Details Name: [name] MRN: [id number] DOB: 11/11/1950   Cancelled treatment:       Reason Eval/Treat Not Completed: SLP screened, no needs identified, will sign off (chart reviewed; consulted NSG then met w/ pt in room. PT arrived also.)   Pt was brought in to the ED with medical history significant of alcohol use/abuse, chronic pain syndrome on chronic narcotic therapy, IDDM, HTN, brought in by family member for evaluation of altered mentations.   Pt denied any difficulty swallowing and is currently on a regular diet; tolerates swallowing pills w/ water per NSG. NSG agreed. However, pt is Edentulous and stated "some meats" are tougher to chew. Requested CUT, moist meats at meals on his diet order. Pt conversed in basic conversation w/out overt expressive/receptive deficits noted; pt denied any speech-language problems. He answered basic questions re: self, DOB, and where he lived appropriately. Speech intelligible though he is Edentulous(which can impact articulation and clarity of speech). No further skilled ST services indicated as pt appears at his baseline. Pt agreed. NSG to reconsult if any change in status while admitted.     Darla Edward, MS, CCC-SLP Speech Language Pathologist Rehab Services; [facility] [phone] (ascom) Jimena Wieczorek 10/29/2023, 10:49 AM

## 2023-10-29 NOTE — Evaluation (Signed)
 Physical Therapy Evaluation Patient Details Name: [name] MRN: [id number] DOB: 05/25/1951 Today's Date: 10/29/2023  History of Present Illness  Sai Zinn is a 63 y.o. male with medical history significant of alcohol abuse, chronic pain syndrome on chronic narcotic therapy, IDDM, HTN, brought in by family member for evaluation of altered mentations.  MD assessment includes: severe sepsis, acute metabolic encephalopathy, acute probably on chronic alcoholic hepatitis, mild rhabdomyelitis, and hypokalemia.   Clinical Impression  Pt was pleasant and motivated to participate during the session and put forth good effort throughout. Pt with improved orientation grossly compared to during OT evaluation but continued to be minimally impulsive with extra time and cuing to follow more complex cuing.  Pt required no physical assistance during the session but did present with mild instability/drifting during gait without an AD that improved grossly with the use of RW.  Pt reported no adverse symptoms during the session with SpO2 and HR WNL throughout.  Pt will benefit from continued PT services upon discharge to safely address deficits listed in patient problem list for decreased caregiver assistance and eventual return to PLOF.           If plan is discharge home, recommend the following: A little help with walking and/or transfers;A little help with bathing/dressing/bathroom;Assist for transportation;Help with stairs or ramp for entrance;Assistance with cooking/housework   Can travel by private vehicle        Equipment Recommendations None recommended by PT  Recommendations for Other Services       Functional Status Assessment Patient has had a recent decline in their functional status and demonstrates the ability to make significant improvements in function in a reasonable and predictable amount of time.     Precautions / Restrictions Precautions Precautions: Fall Recall of  Precautions/Restrictions: Impaired Precaution/Restrictions Comments: Seizure precautions Restrictions Weight Bearing Restrictions Per Provider Order: No      Mobility  Bed Mobility Overal bed mobility: Modified Independent             General bed mobility comments: Min extra time and effort only    Transfers Overall transfer level: Needs assistance Equipment used: None, Rolling walker (2 wheels) Transfers: Bed to chair/wheelchair/BSC, Sit to/from Stand Sit to Stand: Contact guard assist Stand pivot transfers: Contact guard assist         General transfer comment: Pt able to perform multiple transfers from various surfaces with and without an AD with no overt LOB or physical assist needed    Ambulation/Gait Ambulation/Gait assistance: Contact guard assist Gait Distance (Feet): 30 Feet Assistive device: Rolling walker (2 wheels), None Gait Pattern/deviations: Step-through pattern, Decreased step length - right, Decreased step length - left, Drifts right/left Gait velocity: decreased     General Gait Details: Pt ambulated both with and without an AD with mild instability/drifting without the RW which improved with a RW  Stairs            Wheelchair Mobility     Tilt Bed    Modified Rankin (Stroke Patients Only)       Balance Overall balance assessment: Needs assistance Sitting-balance support: No upper extremity supported, Feet supported Sitting balance-Leahy Scale: Good     Standing balance support: No upper extremity supported, During functional activity, Bilateral upper extremity supported Standing balance-Leahy Scale: Fair Standing balance comment: Improved stability with a RW but able to stand, transfer, and perform limited amb without an AD with no physical assist needed to prevent LOB  Pertinent Vitals/Pain Pain Assessment Pain Assessment: No/denies pain    Home Living Family/patient expects to be  discharged to:: Private residence Living Arrangements: Spouse/significant other Available Help at Discharge: Family;Available 24 hours/day Type of Home: House Home Access: Stairs to enter Entrance Stairs-Rails: Right;Left;Can reach both Entrance Stairs-Number of Steps: 2   Home Layout: One level Home Equipment: Agricultural consultant (2 wheels);Cane - single point      Prior Function Prior Level of Function : Independent/Modified Independent             Mobility Comments: Ind amb community distances without an AD, walks 0.5-1.0 miles/day and gardens, 2 falls in the last 6 months both from tripping while hiking in the woods, ADLs Comments: Ind with ADLs     Extremity/Trunk Assessment   Upper Extremity Assessment Upper Extremity Assessment: Overall WFL for tasks assessed    Lower Extremity Assessment Lower Extremity Assessment: Generalized weakness       Communication   Communication Communication: Impaired Factors Affecting Communication: Hearing impaired    Cognition Arousal: Alert Behavior During Therapy: Impulsive   PT - Cognitive impairments: No family/caregiver present to determine baseline, Problem solving, Safety/Judgement                         Following commands: Impaired Following commands impaired: Follows multi-step commands inconsistently, Follows one step commands with increased time     Cueing Cueing Techniques: Verbal cues, Tactile cues, Visual cues     General Comments General comments (skin integrity, edema, etc.): skin tear noted on L upper back - RN notified    Exercises  Static and dynamic unsupported standing activities with head turns and reaching outside BOS   Assessment/Plan    PT Assessment Patient needs continued PT services  PT Problem List Decreased strength;Decreased activity tolerance;Decreased balance;Decreased mobility;Decreased knowledge of use of DME       PT Treatment Interventions DME instruction;Gait  training;Stair training;Functional mobility training;Therapeutic activities;Therapeutic exercise;Balance training;Patient/family education    PT Goals (Current goals can be found in the Care Plan section)  Acute Rehab PT Goals Patient Stated Goal: To get back home PT Goal Formulation: With patient Time For Goal Achievement: 11/11/23 Potential to Achieve Goals: Good    Frequency Min 2X/week     Co-evaluation               AM-PAC PT "6 Clicks" Mobility  Outcome Measure Help needed turning from your back to your side while in a flat bed without using bedrails?: None Help needed moving from lying on your back to sitting on the side of a flat bed without using bedrails?: None Help needed moving to and from a bed to a chair (including a wheelchair)?: A Little Help needed standing up from a chair using your arms (e.g., wheelchair or bedside chair)?: A Little Help needed to walk in hospital room?: A Little Help needed climbing 3-5 steps with a railing? : A Little 6 Click Score: 20    End of Session Equipment Utilized During Treatment: Gait belt Activity Tolerance: Patient tolerated treatment well Patient left: in bed;with bed alarm set;with call bell/phone within reach Nurse Communication: Mobility status PT Visit Diagnosis: Unsteadiness on feet (R26.81);Difficulty in walking, not elsewhere classified (R26.2);Muscle weakness (generalized) (M62.81)    Time: 6962-9528 PT Time Calculation (min) (ACUTE ONLY): 30 min   Charges:   PT Evaluation $PT Eval Moderate Complexity: 1 Mod PT Treatments $Therapeutic Activity: 8-22 mins PT General Charges $$ ACUTE PT VISIT:  1 Visit       D. Scott Axzel [place] PT, DPT 10/29/23, 11:58 AM

## 2023-10-29 NOTE — ED Notes (Signed)
 SLP at [facility].

## 2023-10-29 NOTE — Progress Notes (Signed)
 Eeg done

## 2023-10-29 NOTE — ED Notes (Signed)
 Up to b/r with PT. Gait belt used. Unsteady gait. Mentions L calf cramp. PT mentioned skin tear to mid L back.

## 2023-10-29 NOTE — Plan of Care (Signed)

## 2023-10-29 NOTE — Evaluation (Signed)
 Occupational Therapy Evaluation Patient Details Name: [name] MRN: [id number] DOB: Feb 04, 1951 Today's Date: 10/29/2023   History of Present Illness   Duval Macleod is a 63 y.o. male with medical history significant of alcohol abuse, chronic pain syndrome on chronic narcotic therapy, IDDM, HTN, brought in by family member for evaluation of altered mentations.    Clinical Impressions Mr Reasoner was seen for OT evaluation this date. Prior to hospital admission, pt was IND. Pt lives with spouse. Pt oriented to self and situation only, states it is night time and we are at college. Requires cues to sequence multi-step commands.   Pt currently requires CGA for toilet t/f, multiple stagger steps to correct postural sway. SBA standing pericare/hand washing, cues to sequence. MOD I for bed mobility and self-feeding at bed level. Pt would benefit from skilled OT to address noted impairments and functional limitations (see below for any additional details). Upon hospital discharge, recommend OT follow up.     If plan is discharge home, recommend the following:   A little help with walking and/or transfers;Help with stairs or ramp for entrance     Functional Status Assessment   Patient has had a recent decline in their functional status and demonstrates the ability to make significant improvements in function in a reasonable and predictable amount of time.     Equipment Recommendations   Other (comment) (RW)     Recommendations for Other Services         Precautions/Restrictions   Precautions Precautions: Fall Recall of Precautions/Restrictions: Impaired Restrictions Weight Bearing Restrictions Per Provider Order: No     Mobility Bed Mobility Overal bed mobility: Modified Independent                  Transfers Overall transfer level: Needs assistance Equipment used: None Transfers: Sit to/from Stand Sit to Stand: Contact guard assist                   Balance Overall balance assessment: Needs assistance Sitting-balance support: No upper extremity supported, Feet supported Sitting balance-Leahy Scale: Good     Standing balance support: No upper extremity supported, During functional activity Standing balance-Leahy Scale: Poor                             ADL either performed or assessed with clinical judgement   ADL Overall ADL's : Needs assistance/impaired                                       General ADL Comments: CGA for toilet t/f, multiple stagger steps to correct postural sway. SBA standing pericare/hand washing, cues to sequence      Pertinent Vitals/Pain Pain Assessment Pain Assessment: No/denies pain     Extremity/Trunk Assessment Upper Extremity Assessment Upper Extremity Assessment: Overall WFL for tasks assessed   Lower Extremity Assessment Lower Extremity Assessment: Generalized weakness       Communication Communication Communication: Impaired Factors Affecting Communication: Hearing impaired   Cognition Arousal: Alert Behavior During Therapy: Restless, WFL for tasks assessed/performed Cognition: Cognition impaired   Orientation impairments: Place, Time       Executive functioning impairment (select all impairments): Problem solving OT - Cognition Comments: unable to state location despite options given (states college then hotel) and reports it is night time (breakfast arrived prior to session). Difficulty following 2 step commands  Following commands: Impaired Following commands impaired: Follows multi-step commands inconsistently     Cueing  General Comments   Cueing Techniques: Verbal cues  skin tear noted on L upper back - RN notified   Exercises     Shoulder Instructions      Home Living Family/patient expects to be discharged to:: Private residence Living Arrangements: Spouse/significant other Available Help at Discharge:  Family Type of Home: House Home Access: Stairs to enter [employer] of Steps: 4 Entrance Stairs-Rails: Right;Left Home Layout: One level               Home Equipment: Agricultural consultant (2 wheels);Cane - single point          Prior Functioning/Environment Prior Level of Function : Independent/Modified Independent                    OT Problem List: Decreased strength;Decreased activity tolerance;Impaired balance (sitting and/or standing)   OT Treatment/Interventions: Self-care/ADL training;Therapeutic exercise;Energy conservation;DME and/or AE instruction;Therapeutic activities;Balance training;Patient/family education      OT Goals(Current goals can be found in the care plan section)   Acute Rehab OT Goals Patient Stated Goal: to return to PLOF OT Goal Formulation: With patient Time For Goal Achievement: 11/12/23 Potential to Achieve Goals: Good ADL Goals Pt Will Perform Grooming: with modified independence;standing Pt Will Perform Lower Body Dressing: with modified independence;sit to/from stand Pt Will Transfer to Toilet: with modified independence;ambulating;regular height toilet   OT Frequency:  Min 2X/week    Co-evaluation              AM-PAC OT "6 Clicks" Daily Activity     Outcome Measure Help from another person eating meals?: None Help from another person taking care of personal grooming?: A Little Help from another person toileting, which includes using toliet, bedpan, or urinal?: A Little Help from another person bathing (including washing, rinsing, drying)?: A Lot Help from another person to put on and taking off regular upper body clothing?: None Help from another person to put on and taking off regular lower body clothing?: A Lot 6 Click Score: 18   End of Session Equipment Utilized During Treatment: Gait belt Nurse Communication: Mobility status  Activity Tolerance: Patient tolerated treatment well Patient left: in  bed;with call bell/phone within reach;with bed alarm set  OT Visit Diagnosis: Other abnormalities of gait and mobility (R26.89);Muscle weakness (generalized) (M62.81)                Time: 1610-9604 OT Time Calculation (min): 19 min Charges:  OT General Charges $OT Visit: 1 Visit OT Evaluation $OT Eval Moderate Complexity: 1 Mod  Gordan Latina, M.S. OTR/L  10/29/23, 10:35 AM  ascom [phone]

## 2023-10-29 NOTE — ED Notes (Signed)
 Up to inpt room via bed

## 2023-10-29 NOTE — Progress Notes (Addendum)
 Progress Note   Patient: [name] GEX:[id number] DOB: 01/27/1951 DOA: 10/28/2023     1 DOS: the patient was seen and examined on 10/29/2023   Brief hospital course: "[name] is a 63 y.o. male with medical history significant of alcohol abuse, chronic pain syndrome on chronic narcotic therapy, IDDM, HTN, brought in by family member for evaluation of altered mentations. "  See H&P for full HPI on admission & ED course.  Patient was admitted for severe sepsis due to multifocal pneumonia, possibly aspiration, complicated by acute metabolic encephalopathy, lactic acidosis and is being monitored closely for alcohol withdrawal given recent relapse of drinking for the past several weeks.  Further hospital course and management as outlined below.   Assessment and Plan:  Severe sepsis due to multifocal pneumonia, possible aspiration Positive blood culture - 1/4 bottles +Staph epi, suspect contaminant Sepsis with acute endorgan damage of acute encephalopathy and lactic acidosis, as evidenced by fever, leukocytosis, tachycardia --Continue Rocephin , Flagyl  --Add Doxycycline  for atypical coverage --Mucinex  --Monitor O2 sats and supplement O2 if < 90% on room air --Aspiration precautions --Follow initial blood cultures & collect repeat set --Trend lactic acid --Completed sepsis IV fluids ordered on admission, will resume pending repeat lactate and CK levels.  Caution with IV fluids in setting of cirrhosis.    Acute metabolic encephalopathy Seizure, clinically suspect alcohol withdrawal seizure by history provided on admission --Seizure precautions --CIWA protocol with as needed benzos --Delirium precautions --EEG showed subclinical seizure --Neurology is consulted and plan to start Keppra    Acute, probably on chronic alcoholic hepatitis - Calculated discriminant function= 12.4, no indication for steroid - CT abdominal pelvis showed no significant other acute hepatobiliary findings. -  Manage alcohol withdrawal - Repeat LFT tomorrow   Chronic alcoholic cirrhosis Mild increase of INR and normal albumin level indicating preserved synthetic function Ammonia level within normal limits on admission. --Monitor   HTN - BP borderline low on admission. --BP meds on hold, resume when indicated   IDDM --Holding Lantus --Sliding scale Novolog  for now   Chronic narcotic dependence Currently there is no symptoms or signs of narcotic withdrawal --Monitor   Hypokalemia - K 3.1 Replaced on admission. --Monitor BMP, Mg, phos & replace PRN   Mild rhabdomyolysis UA showed negative myoglobin --Trend CK - follow repeat level - pending --Completed sepsis IV fluids, will resume fluids if needed      Subjective: Pt seen with three family member at bedside today. He reports feeling well, asks if he might go home tomorrow. Family report pt still is confused but mental status has improved, not back to baseline yet.   Physical Exam: Vitals:   10/29/23 1045 10/29/23 1100 10/29/23 1115 10/29/23 1206  BP:  97/71  113/74  Pulse:    (!) 101  Resp: (!) 23 (!) 29 (!) 21 16  Temp:    98.1 F (36.7 C)  TempSrc:      SpO2:    97%  Weight:      Height:       General exam: awake, alert, no acute distress HEENT: moist mucus membranes, hearing grossly normal  Respiratory system: CTAB diminished bases, no wheezes, normal respiratory effort on room air. Cardiovascular system: normal S1/S2, RRR, no JVD, murmurs, rubs, gallops, no pedal edema.   Gastrointestinal system: soft, NT, ND, no HSM felt, +bowel sounds. Central nervous system: A&O x self and hospital. no gross focal neurologic deficits, normal speech, bilateral upper and lower extremity motor intact 5/5 and symmetric, pt follows  commands  Extremities: moves all , no edema, normal tone Skin: dry, intact, normal temperature, wound below knee is scabbed over without surrounding warmth erythema or swelling noted Psychiatry: normal mood,  congruent affect, abnormal judgement and insight    Data Reviewed:  Notable labs --  Bicarb 21 Glucose 140 Ca 7.8 Alk phos 130 Albumin 3.2 >> 2.7 AST 132 >> 181 ALT 47 >> 63 Tbili 3.7 >> 3.0  CK 2356 >> 1847 >> repeat pending Lactate 3.8 >> 2.0 >> repeat pending  WBC 19.5 >> 13.1 Platelets 141 >> 107   Family Communication: ata bedside on rounds  Disposition: Status is: Inpatient Remains inpatient appropriate because: severity of illness as above, remains on IV therapies and evaluation is ongoing   Planned Discharge Destination: Home    Time spent: 45 minutes  Author: Montey Apa, DO 10/29/2023 3:12 PM  For on call review [email or web address].

## 2023-10-30 ENCOUNTER — Inpatient Hospital Stay

## 2023-10-30 ENCOUNTER — Encounter (HOSPITAL_COMMUNITY): Payer: Self-pay

## 2023-10-30 DIAGNOSIS — R6520 Severe sepsis without septic shock: Secondary | ICD-10-CM | POA: Diagnosis not present

## 2023-10-30 DIAGNOSIS — A419 Sepsis, unspecified organism: Secondary | ICD-10-CM | POA: Diagnosis not present

## 2023-10-30 DIAGNOSIS — I639 Cerebral infarction, unspecified: Secondary | ICD-10-CM | POA: Diagnosis not present

## 2023-10-30 DIAGNOSIS — R569 Unspecified convulsions: Secondary | ICD-10-CM | POA: Diagnosis not present

## 2023-10-30 LAB — CBC
HCT: 37.5 % — ABNORMAL LOW (ref 39.0–52.0)
Hemoglobin: 13.5 g/dL (ref 13.0–17.0)
MCH: 34 pg (ref 26.0–34.0)
MCHC: 36 g/dL (ref 30.0–36.0)
MCV: 94.5 fL (ref 80.0–100.0)
Platelets: 93 10*3/uL — ABNORMAL LOW (ref 150–400)
RBC: 3.97 MIL/uL — ABNORMAL LOW (ref 4.22–5.81)
RDW: 13.9 % (ref 11.5–15.5)
WBC: 12.3 10*3/uL — ABNORMAL HIGH (ref 4.0–10.5)
nRBC: 0 % (ref 0.0–0.2)

## 2023-10-30 LAB — CBC WITH DIFFERENTIAL/PLATELET
Abs Immature Granulocytes: 0.09 10*3/uL — ABNORMAL HIGH (ref 0.00–0.07)
Basophils Absolute: 0 10*3/uL (ref 0.0–0.1)
Basophils Relative: 0 %
Eosinophils Absolute: 0 10*3/uL (ref 0.0–0.5)
Eosinophils Relative: 0 %
HCT: 36.1 % — ABNORMAL LOW (ref 39.0–52.0)
Hemoglobin: 13.1 g/dL (ref 13.0–17.0)
Immature Granulocytes: 1 %
Lymphocytes Relative: 6 %
Lymphs Abs: 0.6 10*3/uL — ABNORMAL LOW (ref 0.7–4.0)
MCH: 33.9 pg (ref 26.0–34.0)
MCHC: 36.3 g/dL — ABNORMAL HIGH (ref 30.0–36.0)
MCV: 93.3 fL (ref 80.0–100.0)
Monocytes Absolute: 0.9 10*3/uL (ref 0.1–1.0)
Monocytes Relative: 9 %
Neutro Abs: 9.1 10*3/uL — ABNORMAL HIGH (ref 1.7–7.7)
Neutrophils Relative %: 84 %
Platelets: 84 10*3/uL — ABNORMAL LOW (ref 150–400)
RBC: 3.87 MIL/uL — ABNORMAL LOW (ref 4.22–5.81)
RDW: 14 % (ref 11.5–15.5)
WBC: 10.8 10*3/uL — ABNORMAL HIGH (ref 4.0–10.5)
nRBC: 0 % (ref 0.0–0.2)

## 2023-10-30 LAB — LACTIC ACID, PLASMA
Lactic Acid, Venous: 1.3 mmol/L (ref 0.5–1.9)
Lactic Acid, Venous: 1.3 mmol/L (ref 0.5–1.9)
Lactic Acid, Venous: 1 mmol/L (ref 0.5–1.9)

## 2023-10-30 LAB — COMPREHENSIVE METABOLIC PANEL WITH GFR
ALT: 79 U/L — ABNORMAL HIGH (ref 0–44)
AST: 139 U/L — ABNORMAL HIGH (ref 15–41)
Albumin: 2.7 g/dL — ABNORMAL LOW (ref 3.5–5.0)
Alkaline Phosphatase: 122 U/L (ref 38–126)
Anion gap: 6 (ref 5–15)
BUN: 15 mg/dL (ref 8–23)
CO2: 22 mmol/L (ref 22–32)
Calcium: 7.9 mg/dL — ABNORMAL LOW (ref 8.9–10.3)
Chloride: 101 mmol/L (ref 98–111)
Creatinine, Ser: 0.57 mg/dL — ABNORMAL LOW (ref 0.61–1.24)
GFR, Estimated: >60 mL/min (ref >60)
Glucose, Bld: 172 mg/dL — ABNORMAL HIGH (ref 70–99)
Potassium: 3.2 mmol/L — ABNORMAL LOW (ref 3.5–5.1)
Sodium: 129 mmol/L — ABNORMAL LOW (ref 135–145)
Total Bilirubin: 3.2 mg/dL — ABNORMAL HIGH (ref 0.0–1.2)
Total Protein: 5.4 g/dL — ABNORMAL LOW (ref 6.5–8.1)

## 2023-10-30 LAB — PROTIME-INR
INR: 1.6 — ABNORMAL HIGH (ref 0.8–1.2)
Prothrombin Time: 19.3 s — ABNORMAL HIGH (ref 11.4–15.2)

## 2023-10-30 LAB — GLUCOSE, CAPILLARY
Glucose-Capillary: 172 mg/dL — ABNORMAL HIGH (ref 70–99)
Glucose-Capillary: 172 mg/dL — ABNORMAL HIGH (ref 70–99)
Glucose-Capillary: 153 mg/dL — ABNORMAL HIGH (ref 70–99)
Glucose-Capillary: 127 mg/dL — ABNORMAL HIGH (ref 70–99)

## 2023-10-30 LAB — MAGNESIUM: Magnesium: 1.8 mg/dL (ref 1.7–2.4)

## 2023-10-30 LAB — PHOSPHORUS: Phosphorus: 1.2 mg/dL — ABNORMAL LOW (ref 2.5–4.6)

## 2023-10-30 MED ORDER — PANTOPRAZOLE SODIUM 40 MG IV SOLR: 40.0000 mg | Freq: Every day | INTRAVENOUS | Status: DC

## 2023-10-30 MED ORDER — LORAZEPAM 1 MG PO TABS
1.0000 mg | ORAL_TABLET | ORAL | Status: DC | PRN
Start: 2023-10-30 — End: 2023-10-31

## 2023-10-30 MED ORDER — VITAMIN B-1 100 MG PO TABS: 100.0000 mg | ORAL_TABLET | Freq: Every day | ORAL | Status: AC

## 2023-10-30 MED ORDER — ENOXAPARIN SODIUM 40 MG/0.4ML IJ SOSY
40.0000 mg | PREFILLED_SYRINGE | INTRAMUSCULAR | Status: DC
  Administered 2023-10-30: 40 mg via SUBCUTANEOUS
  Filled 2023-10-30: qty 0.4

## 2023-10-30 MED ORDER — METRONIDAZOLE 500 MG/100ML IV SOLN: 500.0000 mg | Freq: Two times a day (BID) | INTRAVENOUS | Status: DC

## 2023-10-30 MED ORDER — OXYCODONE HCL 5 MG PO TABS: 5.0000 mg | ORAL_TABLET | Freq: Four times a day (QID) | ORAL | Status: DC | PRN

## 2023-10-30 MED ORDER — POTASSIUM CHLORIDE CRYS ER 20 MEQ PO TBCR
40.0000 meq | EXTENDED_RELEASE_TABLET | ORAL | Status: AC
  Administered 2023-10-30: 40 meq via ORAL
  Filled 2023-10-30: qty 2

## 2023-10-30 MED ORDER — METRONIDAZOLE 500 MG/100ML IV SOLN
500.0000 mg | Freq: Two times a day (BID) | INTRAVENOUS | Status: DC
  Administered 2023-10-30 – 2023-10-31 (×3): 500 mg via INTRAVENOUS
  Filled 2023-10-30 (×3): qty 100

## 2023-10-30 MED ORDER — ONDANSETRON HCL 4 MG PO TABS: 4.0000 mg | ORAL_TABLET | Freq: Four times a day (QID) | ORAL | Status: DC | PRN

## 2023-10-30 MED ORDER — LEVETIRACETAM 500 MG PO TABS: 1000.0000 mg | ORAL_TABLET | Freq: Two times a day (BID) | ORAL | Status: DC

## 2023-10-30 MED ORDER — STROKE: EARLY STAGES OF RECOVERY BOOK: Freq: Once | Status: DC

## 2023-10-30 MED ORDER — LEVETIRACETAM 500 MG PO TABS
1500.0000 mg | ORAL_TABLET | Freq: Two times a day (BID) | ORAL | Status: DC
  Administered 2023-10-30 – 2023-10-31 (×2): 1500 mg via ORAL
  Filled 2023-10-30 (×2): qty 3

## 2023-10-30 MED ORDER — FOLIC ACID 1 MG PO TABS: 1.0000 mg | ORAL_TABLET | Freq: Every day | ORAL | Status: AC

## 2023-10-30 MED ORDER — DM-GUAIFENESIN ER 30-600 MG PO TB12: 1.0000 | ORAL_TABLET | Freq: Two times a day (BID) | ORAL | Status: DC

## 2023-10-30 MED ORDER — PANTOPRAZOLE SODIUM 40 MG PO TBEC: 40.0000 mg | DELAYED_RELEASE_TABLET | Freq: Every evening | ORAL | Status: DC

## 2023-10-30 MED ORDER — HYDRALAZINE HCL 20 MG/ML IJ SOLN: 5.0000 mg | Freq: Four times a day (QID) | INTRAMUSCULAR | Status: DC | PRN

## 2023-10-30 MED ORDER — BISACODYL 5 MG PO TBEC: 5.0000 mg | DELAYED_RELEASE_TABLET | Freq: Every day | ORAL | Status: DC | PRN

## 2023-10-30 MED ORDER — SODIUM CHLORIDE 0.9 % IV SOLN: 2.0000 g | INTRAVENOUS | Status: DC

## 2023-10-30 MED ORDER — LEVETIRACETAM 750 MG PO TABS: 1500.0000 mg | ORAL_TABLET | Freq: Two times a day (BID) | ORAL | Status: DC

## 2023-10-30 MED ORDER — IOHEXOL 350 MG/ML SOLN
75.0000 mL | Freq: Once | INTRAVENOUS | Status: AC | PRN
Start: 2023-10-30 — End: 2023-10-30
  Administered 2023-10-30: 75 mL via INTRAVENOUS

## 2023-10-30 NOTE — Procedures (Signed)
 History: 63 yo M preseting with seizure  EEG duration: 28 minutes  Sedation: None  Patient State: Awake and drowsy  Technique: This EEG was acquired with electrodes placed according to the International 10-20 electrode system (including Fp1, Fp2, F3, F4, C3, C4, P3, P4, O1, O2, T3, T4, T5, T6, A1, A2, Fz, Cz, Pz). The following electrodes were missing or displaced: none.   Background: There is a posterior dominant rhythm seen at times with a frequency of 9 to 10 Hz.  In addition there is generalized irregular slow activity intruding into the background even during the maximal waking state.  During the recording, the EEG technician noticed an abrupt confusional episode.  At that time, they began the pattern of sharply contoured periodic discharges with a complex morphology beginning in the left frontocentral region.  This pattern continues and is associated with some rhythmic underlying slow activity which builds in amplitude before terminating.  There is significant movement artifact obscuring parts of the recording during this discharge.     He did have some movements of the left leg most resembling automatism, did not appear to be clonic activity during the discharges.  Photic stimulation: Physiologic driving is not performed  EEG Abnormalities: 1) left frontocentral seizure lasting approximately 8 minutes 2) generalized irregular slow activity  Clinical Interpretation: This EEG recorded a partial seizure arising from the left frontocentral region without definite clinical correlate by video, but with reported confusion by EEG tech.  Ann Keto, MD [facility]   If 7pm- 7am, please page neurology on call as listed in AMION.

## 2023-10-30 NOTE — Discharge Instructions (Signed)

## 2023-10-30 NOTE — Discharge Summary (Signed)
 Physician Discharge Summary   Patient: [name] MRN: [id number] DOB: 07/08/1951  Admit date:     10/28/2023  Discharge date: 10/30/23  Discharge Physician: Ezzard Holms   PCP: Lamon Pillow, MD   Recommendations at discharge:  Being transferred to [facility] for continuous EEG  Discharge Diagnoses:  Severe sepsis due to multifocal pneumonia, possible aspiration Acute metabolic encephalopathy Seizure Acute, probably on chronic alcoholic hepatitis Chronic alcoholic cirrhosis HTN  IDDM Chronic narcotic dependence Hypokalemia  Mild rhabdomyolysis   Brief hospital course:  [name] is a 63 y.o. male with medical history significant of alcohol abuse, chronic pain syndrome on chronic narcotic therapy, IDDM, HTN, brought in by family member for evaluation of altered mentations. Patient is still confused unable to provide any history, and so all history provided by wife at bedside as well as from chart review.  Patient has been drinking, earlier this year he was seen in the alcohol rehab and remained sober until about 6 weeks ago.  And due to stressful life event patient started to pick up drinking again about 4 weeks ago, last drink was the day before presentation.  Patient was found by the wife in bathtub snoring heavily and not responding.  Wife herself has had severe knee problems and only able to help him to get out of bathtub.  Wife denied seeing any suspicious seizure-like movement.  EMS was subsequently called.  EMS reported that the patient had a short course of seizure activity 15-20 seconds, did not need any Ativan  and recovered consciousness symptoms of no tongue biting or loss control bowel movement or urination. ED Course: Febrile temperature 102.6 tachycardia borderline hypotensive not hypoxic.  Chest x-ray and CT chest abdomen pelvis showed bilateral lower field infiltrates compatible with aspiration pneumonia.  Liver cirrhosis with focal ascites around the liver, sodium  137 potassium 3.1 BUN 25 creatinine 0.9 lactic acid 3.80 ammonia 26, CK20 355, AST 132 ALT 47 bilirubin 3.7.  No CBD dilatation, no signs to suspect acute cholecystitis.  WBC 19.5, hemoglobin 6.1.  Hospital course.  Patient was subsequently admitted for severe sepsis due to multifocal pneumonia, possibly aspiration, complicated by acute metabolic encephalopathy, lactic acidosis and is being monitored closely for alcohol withdrawal given recent relapse of drinking for the past several weeks.  Patient underwent EEG on 10/29/2023 and was found to have had some concerns of subclinical seizure and patient was subsequently initiated on Keppra  by neurologist.  Since patient's encephalopathy have not improved, neurologist is concerned that patient is having intermittent subclinical seizures and the next step is to obtain long-term continuous EEG.  Neurologist have therefore recommended transfer for continuous EEG.  Neurologist on-call Dr. Greg Leaks is aware of patient and needs to be consulted upon arrival.  Assessment and Plan:   Severe sepsis due to multifocal pneumonia, possible aspiration Positive blood culture - 1/4 bottles +Staph epi, suspect contaminant Sepsis with acute endorgan damage of acute encephalopathy and lactic acidosis, as evidenced by fever, leukocytosis, tachycardia --Continue Rocephin , Flagyl  --Added Doxycycline  for atypical coverage --Continue Mucinex  --Monitor O2 sats and supplement O2 if < 90% on room air --Continue aspiration precautions     Acute metabolic encephalopathy Seizure, clinically suspect alcohol withdrawal seizure by history provided on admission --Seizure precautions --CIWA protocol with as needed benzos --Delirium precautions --EEG showed subclinical seizure --Neurology is consulted and Keppra  initiated Neurologist as recommended the patient be transferred to [facility] for continuous EEG Dr. Greg Leaks the neurologist on-call at [facility] aware of  patient  and should be consulted on arrival.   Acute, probably on chronic alcoholic hepatitis - Calculated discriminant function= 12.4, no indication for steroid - CT abdominal pelvis showed no significant other acute hepatobiliary findings. - Manage alcohol withdrawal Continue to monitor LFTs   Chronic alcoholic cirrhosis Mild increase of INR and normal albumin level indicating preserved synthetic function Ammonia level within normal limits on admission. Continue to monitor closely   HTN - BP borderline low on admission. --BP meds on hold, resume when indicated   IDDM --Holding Lantus --Sliding scale Novolog  for now   Chronic narcotic dependence Currently there is no symptoms or signs of narcotic withdrawal --Monitor   Hypokalemia -continue replacement and monitoring   Mild rhabdomyolysis CK level improving     Consultants: Neurology Procedures performed: EEG Disposition: Home Diet recommendation:  Cardiac diet DISCHARGE MEDICATION: Allergies as of 10/30/2023       Reactions   Hydrochlorothiazide Other (See Comments)   Hyponatremia         Medication List     STOP taking these medications    carvedilol 3.125 MG tablet Commonly known as: COREG   eplerenone 25 MG tablet Commonly known as: INSPRA   furosemide 20 MG tablet Commonly known as: LASIX   lisinopril 5 MG tablet Commonly known as: ZESTRIL   Lokelma  5 g packet Generic drug: sodium zirconium cyclosilicate    spironolactone 100 MG tablet Commonly known as: ALDACTONE       TAKE these medications    acetaminophen  500 MG tablet Commonly known as: TYLENOL  Take 1,000 mg by mouth every 6 (six) hours as needed for mild pain (pain score 1-3).   allopurinol  100 MG tablet Commonly known as: ZYLOPRIM  Take 100 mg by mouth daily.   bisacodyl  5 MG EC tablet Commonly known as: DULCOLAX Take 1 tablet (5 mg total) by mouth daily as needed for moderate constipation.   cefTRIAXone  2 g in sodium chloride  0.9  % 100 mL Inject 2 g into the vein daily.   clobetasol 0.05 % external solution Commonly known as: TEMOVATE Apply 1 Application topically 2 (two) times daily as needed.   dextromethorphan -guaiFENesin  30-600 MG 12hr tablet Commonly known as: MUCINEX  DM Take 1 tablet by mouth 2 (two) times daily.   folic acid  1 MG tablet Commonly known as: FOLVITE  Take 1 tablet (1 mg total) by mouth daily. Start taking on: October 31, 2023   hydrALAZINE  20 MG/ML injection Commonly known as: APRESOLINE  Inject 0.25 mLs (5 mg total) into the vein every 6 (six) hours as needed (SBP>150 or DBP>100).   insulin  aspart 100 UNIT/ML injection Commonly known as: novoLOG  Inject 5-20 Units into the skin 3 (three) times daily before meals. SLIDING SCALE   levETIRAcetam  750 MG tablet Commonly known as: KEPPRA  Take 2 tablets (1,500 mg total) by mouth 2 (two) times daily.   LORazepam  1 MG tablet Commonly known as: ATIVAN  Take 1-4 tablets (1-4 mg total) by mouth every hour as needed (withdrawal symptoms:  anxiety, agitation, insomnia, diaphoresis, nausea, vomiting, tremors, tachycardia, or hypertension.).   magnesium gluconate 500 (27 Mg) MG Tabs tablet Commonly known as: MAGONATE Take 500 mg by mouth daily.   metroNIDAZOLE  500 MG/100ML Commonly known as: FLAGYL  Inject 100 mLs (500 mg total) into the vein every 12 (twelve) hours.   multivitamin with minerals tablet Take 1 tablet by mouth daily.   mupirocin ointment 2 % Commonly known as: BACTROBAN Apply 1 Application topically daily.   niacin 500 MG tablet Commonly known as:  VITAMIN B3 Take 500 mg by mouth at bedtime.   ondansetron  4 MG tablet Commonly known as: ZOFRAN  Take 1 tablet (4 mg total) by mouth every 6 (six) hours as needed for nausea.   oxyCODONE  5 MG immediate release tablet Commonly known as: Oxy IR/ROXICODONE  Take 1 tablet (5 mg total) by mouth every 6 (six) hours as needed for moderate pain (pain score 4-6) or severe pain (pain score  7-10).   sertraline  100 MG tablet Commonly known as: ZOLOFT  Take 100 mg by mouth daily.   thiamine  100 MG tablet Commonly known as: Vitamin B-1 Take 1 tablet (100 mg total) by mouth daily. Start taking on: October 31, 2023   traMADol 50 MG tablet Commonly known as: ULTRAM Take 50 mg by mouth every 8 (eight) hours as needed for moderate pain (pain score 4-6).   Tresiba FlexTouch 100 UNIT/ML FlexTouch Pen Generic drug: insulin  degludec Inject 20 Units into the skin at bedtime.   zinc sulfate (50mg  elemental zinc) 220 (50 Zn) MG capsule Take 220 mg by mouth daily.        Discharge Exam: Filed Weights   10/28/23 1059 10/28/23 1554  Weight: 62.3 kg 63.5 kg   General exam: Elderly male laying in bed confused HEENT: moist mucus membranes, hearing grossly normal  Respiratory system: CTAB diminished bases Cardiovascular system: normal S1/S2, RRR Gastrointestinal system: soft, NT, ND, no HSM felt, +bowel sounds. Central nervous system: Alert and oriented to self, moving all extremities Extremities: moves all , no edema, normal tone Skin: dry, intact, normal temperature, wound below knee is scabbed     Condition at discharge: fair  The results of significant diagnostics from this hospitalization (including imaging, microbiology, ancillary and laboratory) are listed below for reference.   Imaging Studies: MR BRAIN WO CONTRAST Addendum Date: 10/30/2023 ADDENDUM REPORT: 10/30/2023 14:06 ADDENDUM: Subsequently obtained CTA of the head and neck shows that the area of abnormal diffusion weighted imaging in the right cerebellar hemisphere is more consistent with a subacute infarction. There is no hemorrhage. Electronically Signed   By: Juanetta Nordmann M.D.   On: 10/30/2023 14:06   Result Date: 10/30/2023 CLINICAL DATA:  Seizure disorder EXAM: MRI HEAD WITHOUT CONTRAST TECHNIQUE: Multiplanar, multiecho pulse sequences of the brain and surrounding structures were obtained without intravenous  contrast. COMPARISON:  10/28/2023 FINDINGS: Brain: Intraparenchymal hemorrhage within the right cerebellum measuring 3.2 x 1.7 cm, new since the earlier head CT. Normal white matter signal, parenchymal volume and CSF spaces. The midline structures are normal. Vascular: Normal flow voids. Skull and upper cervical spine: Normal calvarium and skull base. Visualized upper cervical spine and soft tissues are normal. Sinuses/Orbits:No paranasal sinus fluid levels or advanced mucosal thickening. No mastoid or middle ear effusion. Normal orbits. IMPRESSION: Intraparenchymal hemorrhage within the right cerebellum measuring 3.2 x 1.7 cm, new since the earlier head CT. Critical Value/emergent results were called by telephone at the time of interpretation on 10/29/2023 at 11:22 pm to provider [facility], who verbally acknowledged these results. Electronically Signed: By: Juanetta Nordmann M.D. On: 10/29/2023 23:22   CT ANGIO HEAD NECK W WO CM Result Date: 10/30/2023 CLINICAL DATA:  Acute neurologic deficit EXAM: CT ANGIOGRAPHY HEAD AND NECK WITH AND WITHOUT CONTRAST TECHNIQUE: Multidetector CT imaging of the head and neck was performed using the standard protocol during bolus administration of intravenous contrast. Multiplanar CT image reconstructions and MIPs were obtained to evaluate the vascular anatomy. Carotid stenosis measurements (when applicable) are obtained utilizing NASCET criteria, using the distal  internal carotid diameter as the denominator. RADIATION DOSE REDUCTION: This exam was performed according to the departmental dose-optimization program which includes automated exposure control, adjustment of the mA and/or kV according to patient size and/or use of iterative reconstruction technique. CONTRAST:  75mL OMNIPAQUE  IOHEXOL  350 MG/ML SOLN COMPARISON:  Brain MRI 10/29/2023 FINDINGS: CT HEAD FINDINGS Brain: There is actually no hemorrhage within the right cerebellar hemisphere. There is focal hypoattenuation  consistent with a subacute infarct. No midline shift or other mass effect. Mild generalized volume loss Vascular: No hyperdense vessel or unexpected vascular calcification. Skull: The visualized skull base, calvarium and extracranial soft tissues are normal. Sinuses/Orbits: No fluid levels or advanced mucosal thickening of the visualized paranasal sinuses. No mastoid or middle ear effusion. Normal orbits. CTA NECK FINDINGS Skeleton: No acute abnormality or high grade bony spinal canal stenosis. Other neck: Normal pharynx, larynx and major salivary glands. No cervical lymphadenopathy. Unremarkable thyroid  gland. Upper chest: Biapical opacities, right greater than left. Aortic arch: There is no calcific atherosclerosis of the aortic arch. Conventional 3 vessel aortic branching pattern. RIGHT carotid system: Normal without aneurysm, dissection or stenosis. LEFT carotid system: Normal without aneurysm, dissection or stenosis. Vertebral arteries: Codominant configuration. There is no dissection, occlusion or flow-limiting stenosis to the skull base (V1-V3 segments). CTA HEAD FINDINGS POSTERIOR CIRCULATION: Vertebral arteries are normal. No proximal occlusion of the anterior or inferior cerebellar arteries. Basilar artery is normal. Superior cerebellar arteries are normal. Posterior cerebral arteries are normal. ANTERIOR CIRCULATION: Atherosclerotic calcification of the internal carotid arteries at the skull base without hemodynamically significant stenosis. Anterior cerebral arteries are normal. Middle cerebral arteries are normal. Venous sinuses: As permitted by contrast timing, patent. Anatomic variants: None Review of the MIP images confirms the above findings. IMPRESSION: 1. No emergent large vessel occlusion or high-grade stenosis of the intracranial arteries. 2. 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 3. Biapical opacities, right greater than left, which could indicate infection or pulmonary edema.  Electronically Signed   By: Juanetta Nordmann M.D.   On: 10/30/2023 02:48   EEG adult Result Date: 10/29/2023 Augustin Leber, MD     10/30/2023  2:44 PM History: 63 yo M preseting with seizure EEG duration: 28 minutes Sedation: None Patient State: Awake and drowsy Technique: This EEG was acquired with electrodes placed according to the International 10-20 electrode system (including Fp1, Fp2, F3, F4, C3, C4, P3, P4, O1, O2, T3, T4, T5, T6, A1, A2, Fz, Cz, Pz). The following electrodes were missing or displaced: none. Background: There is a posterior dominant rhythm seen at times with a frequency of 9 to 10 Hz.  In addition there is generalized irregular slow activity intruding into the background even during the maximal waking state.  During the recording, the EEG technician noticed an abrupt confusional episode.  At that time, they began the pattern of sharply contoured periodic discharges with a complex morphology beginning in the left frontocentral region.  This pattern continues and is associated with some rhythmic underlying slow activity which builds in amplitude before terminating.  There is significant movement artifact obscuring parts of the recording during this discharge.  He did have some movements of the left leg most resembling automatism, did not appear to be clonic activity during the discharges. Photic stimulation: Physiologic driving is not performed EEG Abnormalities: 1) left frontocentral seizure lasting approximately 8 minutes 2) generalized irregular slow activity Clinical Interpretation: This EEG recorded a partial seizure arising from the left frontocentral region without definite clinical correlate by video,  but with reported confusion by EEG tech. Ann Keto, MD [facility] If 7pm- 7am, please page neurology on call as listed in AMION.  DG Chest 1 View Result Date: 10/29/2023 CLINICAL DATA:  Pneumonia. EXAM: CHEST  1 VIEW COMPARISON:  Radiograph yesterday  FINDINGS: Worsening patchy bilateral airspace disease since yesterday. Stable heart size and mediastinal contours. No pleural fluid or pneumothorax. IMPRESSION: Worsening patchy bilateral airspace disease since yesterday, suspicious for worsening pneumonia. Electronically Signed   By: Chadwick Colonel M.D.   On: 10/29/2023 10:07   DG Knee 1-2 Views Left Result Date: 10/28/2023 CLINICAL DATA:  Knee pain. Technologist notes state anterior wound of left knee. EXAM: LEFT KNEE - 1-2 VIEW COMPARISON:  None Available. FINDINGS: The bones are subjectively under mineralized. No fracture. Normal alignment. Minor peripheral spurring. Patellar tendon enthesophyte at the patellar insertion. No erosive change or focal bone abnormality. No joint effusion. Vascular calcifications are seen no soft tissue gas radiopaque foreign body. IMPRESSION: 1. Mild degenerative change. No acute osseous abnormality. 2. Subjective osteopenia/osteoporosis. Electronically Signed   By: Chadwick Colonel M.D.   On: 10/28/2023 18:17   CT ABDOMEN PELVIS W CONTRAST Result Date: 10/28/2023 CLINICAL DATA:  Sepsis. Altered mental status with elevated liver function studies. EXAM: CT ABDOMEN AND PELVIS WITH CONTRAST TECHNIQUE: Multidetector CT imaging of the abdomen and pelvis was performed using the standard protocol following bolus administration of intravenous contrast. RADIATION DOSE REDUCTION: This exam was performed according to the departmental dose-optimization program which includes automated exposure control, adjustment of the mA and/or kV according to patient size and/or use of iterative reconstruction technique. CONTRAST:  OMNIPAQUE  IOHEXOL  300 MG/ML  SOLN COMPARISON:  None Available. FINDINGS: Technical note: Despite efforts by the technologist and patient, mild motion artifact is present on today's exam and could not be eliminated. This reduces exam sensitivity and specificity. Lower chest: There are patchy airspace opacities at both  lung bases, right greater than left, suspicious for aspiration. No significant pleural or pericardial effusion. There are prominent calcifications of the aortic valve with aortic and coronary artery atherosclerosis. Hepatobiliary: Severe hepatic steatosis with contour irregularity of the liver suspicious for cirrhosis. No focally suspicious liver lesions are identified. There are calcifications within the dome of the left hepatic lobe. No significant biliary dilatation status post cholecystectomy. Pancreas: Severe atrophy with scattered parenchymal calcifications suggesting chronic calcific pancreatitis. No significant pancreatic ductal dilatation or surrounding fluid collection identified. Spleen: Borderline splenomegaly.  No focal abnormality. Adrenals/Urinary Tract: Both adrenal glands appear normal. Punctate nonobstructing calculus in the lower pole of the right kidney. No other evidence of urinary tract calculus, hydronephrosis or perinephric soft tissue stranding. There is no evidence of renal mass. The bladder appears unremarkable for its degree of distention. Stomach/Bowel: No enteric contrast administered. Possible diffuse gastric wall thickening versus incomplete distension. No evidence of bowel distension, wall thickening or surrounding inflammation. Vascular/Lymphatic: Aortic and branch vessel atherosclerosis without evidence of aneurysm or large vessel occlusion. There are venous collaterals in the mid abdomen, likely due to underlying portal hypertension. No evidence of acute venous thrombosis. No enlarged abdominopelvic lymph nodes are identified. Reproductive: Mild enlargement of the prostate gland. Other: Upper abdominal ascites, primarily around the liver. No organized fluid collection or pneumoperitoneum. The abdominal wall appears intact. Musculoskeletal: No acute or significant osseous findings. Advanced degenerative disc disease with grade 1 anterolisthesis and biforaminal narrowing at L4-5.  IMPRESSION: 1. Patchy airspace opacities at both lung bases, right greater than left, suspicious for aspiration. 2. Severe hepatic  steatosis with contour irregularity of the liver suspicious for cirrhosis. There are venous collaterals in the mid abdomen and borderline splenomegaly, likely due to underlying portal hypertension. 3. Upper abdominal ascites, primarily around the liver. 4. Possible diffuse gastric wall thickening versus incomplete distension. Correlate clinically for gastritis. 5. Punctate nonobstructing right renal calculus. 6. Prominent calcifications of the aortic valve. Aortic atherosclerosis. Electronically Signed   By: Elmon Hagedorn M.D.   On: 10/28/2023 14:07   DG Chest Port 1 View Result Date: 10/28/2023 CLINICAL DATA:  Questionable sepsis.  Altered mental status. EXAM: PORTABLE CHEST 1 VIEW COMPARISON:  None Available. FINDINGS: 1110 hours. The heart size and mediastinal contours are normal. There are patchy airspace opacities in both lungs, right greater than left. No evidence of pneumothorax or significant pleural effusion. The bones appear unremarkable. Telemetry leads overlie the chest. IMPRESSION: Patchy airspace opacities in both lungs, right greater than left, suspicious for multifocal pneumonia. Electronically Signed   By: Elmon Hagedorn M.D.   On: 10/28/2023 13:56   CT Head Wo Contrast Result Date: 10/28/2023 CLINICAL DATA:  Altered mental status, aphasic, possible history of seizures. EXAM: CT HEAD WITHOUT CONTRAST TECHNIQUE: Contiguous axial images were obtained from the base of the skull through the vertex without intravenous contrast. RADIATION DOSE REDUCTION: This exam was performed according to the departmental dose-optimization program which includes automated exposure control, adjustment of the mA and/or kV according to patient size and/or use of iterative reconstruction technique. COMPARISON:  None Available. FINDINGS: Brain: No acute intracranial hemorrhage. No CT  evidence of acute infarct. Nonspecific hypoattenuation in the periventricular and subcortical white matter favored to reflect chronic microvascular ischemic changes. Remote lacunar infarct in the right thalamus. No edema, mass effect, or midline shift. The basilar cisterns are patent. Ventricles: The ventricles are normal. Vascular: Atherosclerotic calcifications of the carotid siphons. No hyperdense vessel. Skull: No acute or aggressive finding. Orbits: Orbits are symmetric. Sinuses: The visualized paranasal sinuses are clear. Other: Mastoid air cells are clear. IMPRESSION: Examination is slightly limited due to motion artifact. No CT evidence of acute intracranial abnormality. Mild chronic microvascular ischemic changes. Remote lacunar infarct in the right thalamus. Electronically Signed   By: Denny Flack M.D.   On: 10/28/2023 13:09    Microbiology: Results for orders placed or performed during the hospital encounter of 10/28/23  Blood Culture (routine x 2)     Status: None (Preliminary result)   Collection Time: 10/28/23 11:14 AM   Specimen: BLOOD  Result Value Ref Range Status   Specimen Description BLOOD LEFT ARM  Final   Special Requests   Final    BOTTLES DRAWN AEROBIC AND ANAEROBIC Blood Culture results may not be optimal due to an inadequate volume of blood received in culture bottles   Culture   Final    NO GROWTH 2 DAYS Performed at [facility], [address] [place], [place] [address]    Report Status PENDING  Incomplete  Blood Culture (routine x 2)     Status: Abnormal (Preliminary result)   Collection Time: 10/28/23 11:14 AM   Specimen: BLOOD  Result Value Ref Range Status   Specimen Description   Final    BLOOD RIGHT ARM Performed at [facility], [address], [place], [place] [address]    Special Requests   Final    BOTTLES DRAWN AEROBIC AND ANAEROBIC Blood Culture results may not be optimal due to an inadequate volume of blood received in culture  bottles Performed at [facility], [address] [place], [place] [address]    Culture  Setup Time   Final    GRAM POSITIVE COCCI AEROBIC BOTTLE ONLY CRITICAL RESULT CALLED TO, READ BACK BY AND VERIFIED WITH: WILL Alva Jewels [facility] 60[address] 10/29/23 HNM    Culture (A)  Final    STAPHYLOCOCCUS EPIDERMIDIS THE SIGNIFICANCE OF ISOLATING THIS ORGANISM FROM A SINGLE SET OF BLOOD CULTURES WHEN MULTIPLE SETS ARE DRAWN IS UNCERTAIN. PLEASE NOTIFY THE MICROBIOLOGY DEPARTMENT WITHIN ONE WEEK IF SPECIATION AND SENSITIVITIES ARE REQUIRED. Performed at [facility], [address], [place], [place] [address]    Report Status PENDING  Incomplete  Resp panel by RT-PCR (RSV, Flu A&B, Covid) Urine, Catheterized     Status: None   Collection Time: 10/28/23 11:14 AM   Specimen: Urine, Catheterized; Nasal Swab  Result Value Ref Range Status   SARS Coronavirus 2 by RT PCR NEGATIVE NEGATIVE Final    Comment: (NOTE) SARS-CoV-2 target nucleic acids are NOT DETECTED.  The SARS-CoV-2 RNA is generally detectable in upper respiratory specimens during the acute phase of infection. The lowest concentration of SARS-CoV-2 viral copies this assay can detect is 138 copies/mL. A negative result does not preclude SARS-Cov-2 infection and should not be used as the sole basis for treatment or other patient management decisions. A negative result may occur with  improper specimen collection/handling, submission of specimen other than nasopharyngeal swab, presence of viral mutation(s) within the areas targeted by this assay, and inadequate number of viral copies(<138 copies/mL). A negative result must be combined with clinical observations, patient history, and epidemiological information. The expected result is Negative.  Fact Sheet for Patients:  BloggerCourse.com  Fact Sheet for Healthcare Providers:  SeriousBroker.it  This test is no t yet approved or  cleared by the [place]  FDA and  has been authorized for detection and/or diagnosis of SARS-CoV-2 by FDA under an Emergency Use Authorization (EUA). This EUA will remain  in effect (meaning this test can be used) for the duration of the COVID-19 declaration under Section 564(b)(1) of the Act, 21 U.S.C.section 360bbb-3(b)(1), unless the authorization is terminated  or revoked sooner.       Influenza A by PCR NEGATIVE NEGATIVE Final   Influenza B by PCR NEGATIVE NEGATIVE Final    Comment: (NOTE) The Xpert Xpress SARS-CoV-2/FLU/RSV plus assay is intended as an aid in the diagnosis of influenza from Nasopharyngeal swab specimens and should not be used as a sole basis for treatment. Nasal washings and aspirates are unacceptable for Xpert Xpress SARS-CoV-2/FLU/RSV testing.  Fact Sheet for Patients: BloggerCourse.com  Fact Sheet for Healthcare Providers: SeriousBroker.it  This test is not yet approved or cleared by the [place]  FDA and has been authorized for detection and/or diagnosis of SARS-CoV-2 by FDA under an Emergency Use Authorization (EUA). This EUA will remain in effect (meaning this test can be used) for the duration of the COVID-19 declaration under Section 564(b)(1) of the Act, 21 U.S.C. section 360bbb-3(b)(1), unless the authorization is terminated or revoked.     Resp Syncytial Virus by PCR NEGATIVE NEGATIVE Final    Comment: (NOTE) Fact Sheet for Patients: BloggerCourse.com  Fact Sheet for Healthcare Providers: SeriousBroker.it  This test is not yet approved or cleared by the [place]  FDA and has been authorized for detection and/or diagnosis of SARS-CoV-2 by FDA under an Emergency Use Authorization (EUA). This EUA will remain in effect (meaning this test can be used) for the duration of the COVID-19 declaration under Section 564(b)(1)  of the Act, 21  U.S.C. section 360bbb-3(b)(1), unless the authorization is terminated or revoked.  Performed at [facility], [address] [place], [place] [address]   Blood Culture ID Panel (Reflexed)     Status: Abnormal   Collection Time: 10/28/23 11:14 AM  Result Value Ref Range Status   Enterococcus faecalis NOT DETECTED NOT DETECTED Final   Enterococcus Faecium NOT DETECTED NOT DETECTED Final   Listeria monocytogenes NOT DETECTED NOT DETECTED Final   Staphylococcus species DETECTED (A) NOT DETECTED Final    Comment: CRITICAL RESULT CALLED TO, READ BACK BY AND VERIFIED WITH: Lafrances Pigeon [facility] 1914 10/29/23 HNM    Staphylococcus aureus (BCID) NOT DETECTED NOT DETECTED Final   Staphylococcus epidermidis DETECTED (A) NOT DETECTED Final    Comment: CRITICAL RESULT CALLED TO, READ BACK BY AND VERIFIED WITH: Lafrances Pigeon [facility] 7829 10/29/23 HNM    Staphylococcus lugdunensis NOT DETECTED NOT DETECTED Final   Streptococcus species NOT DETECTED NOT DETECTED Final   Streptococcus agalactiae NOT DETECTED NOT DETECTED Final   Streptococcus pneumoniae NOT DETECTED NOT DETECTED Final   Streptococcus pyogenes NOT DETECTED NOT DETECTED Final   A.calcoaceticus-baumannii NOT DETECTED NOT DETECTED Final   Bacteroides fragilis NOT DETECTED NOT DETECTED Final   Enterobacterales NOT DETECTED NOT DETECTED Final   Enterobacter cloacae complex NOT DETECTED NOT DETECTED Final   Escherichia coli NOT DETECTED NOT DETECTED Final   Klebsiella aerogenes NOT DETECTED NOT DETECTED Final   Klebsiella oxytoca NOT DETECTED NOT DETECTED Final   Klebsiella pneumoniae NOT DETECTED NOT DETECTED Final   Proteus species NOT DETECTED NOT DETECTED Final   Salmonella species NOT DETECTED NOT DETECTED Final   Serratia marcescens NOT DETECTED NOT DETECTED Final   Haemophilus influenzae NOT DETECTED NOT DETECTED Final   Neisseria meningitidis NOT DETECTED NOT DETECTED Final   Pseudomonas aeruginosa NOT DETECTED NOT  DETECTED Final   Stenotrophomonas maltophilia NOT DETECTED NOT DETECTED Final   Candida albicans NOT DETECTED NOT DETECTED Final   Candida auris NOT DETECTED NOT DETECTED Final   Candida glabrata NOT DETECTED NOT DETECTED Final   Candida krusei NOT DETECTED NOT DETECTED Final   Candida parapsilosis NOT DETECTED NOT DETECTED Final   Candida tropicalis NOT DETECTED NOT DETECTED Final   Cryptococcus neoformans/gattii NOT DETECTED NOT DETECTED Final   Methicillin resistance mecA/C NOT DETECTED NOT DETECTED Final    Comment: Performed at [facility], [address] [place], [place] [address]  C Difficile Quick Screen w PCR reflex     Status: None   Collection Time: 10/29/23  5:42 PM   Specimen: STOOL  Result Value Ref Range Status   C Diff antigen NEGATIVE NEGATIVE Final   C Diff toxin NEGATIVE NEGATIVE Final   C Diff interpretation No C. difficile detected.  Final    Comment: Performed at [facility], [address] [place], [place] [address]  Gastrointestinal Panel by PCR , Stool     Status: None   Collection Time: 10/29/23  5:42 PM   Specimen: STOOL  Result Value Ref Range Status   Campylobacter species NOT DETECTED NOT DETECTED Final   Plesimonas shigelloides NOT DETECTED NOT DETECTED Final   Salmonella species NOT DETECTED NOT DETECTED Final   Yersinia enterocolitica NOT DETECTED NOT DETECTED Final   Vibrio species NOT DETECTED NOT DETECTED Final   Vibrio cholerae NOT DETECTED NOT DETECTED Final   Enteroaggregative E coli (EAEC) NOT DETECTED NOT DETECTED Final   Enteropathogenic E coli (EPEC) NOT DETECTED NOT  DETECTED Final   Enterotoxigenic E coli (ETEC) NOT DETECTED NOT DETECTED Final   Shiga like toxin producing E coli (STEC) NOT DETECTED NOT DETECTED Final   Shigella/Enteroinvasive E coli (EIEC) NOT DETECTED NOT DETECTED Final   Cryptosporidium NOT DETECTED NOT DETECTED Final   Cyclospora cayetanensis NOT DETECTED NOT DETECTED Final   Entamoeba  histolytica NOT DETECTED NOT DETECTED Final   Giardia lamblia NOT DETECTED NOT DETECTED Final   Adenovirus F40/41 NOT DETECTED NOT DETECTED Final   Astrovirus NOT DETECTED NOT DETECTED Final   Norovirus GI/GII NOT DETECTED NOT DETECTED Final   Rotavirus A NOT DETECTED NOT DETECTED Final   Sapovirus (I, II, IV, and V) NOT DETECTED NOT DETECTED Final    Comment: Performed at [facility], [address] [place], [place] [address]  Culture, blood (single) w Reflex to ID Panel     Status: None (Preliminary result)   Collection Time: 10/29/23  5:55 PM   Specimen: BLOOD  Result Value Ref Range Status   Specimen Description BLOOD LEFT ANTECUBITAL  Final   Special Requests   Final    BOTTLES DRAWN AEROBIC AND ANAEROBIC Blood Culture adequate volume   Culture   Final    NO GROWTH < 24 HOURS Performed at [facility], [address] [place], [place] [address]    Report Status PENDING  Incomplete    Labs: CBC: Recent Labs  Lab 10/28/23 1114 10/29/23 0518 10/30/23 0018 10/30/23 0509  WBC 19.5* 13.1* 12.3* 10.8*  NEUTROABS 17.3*  --   --  9.1*  HGB 16.1 14.2 13.5 13.1  HCT 43.8 39.2 37.5* 36.1*  MCV 92.2 94.7 94.5 93.3  PLT 141* 107* 93* 84*   Basic Metabolic Panel: Recent Labs  Lab 10/28/23 1114 10/29/23 0518 10/30/23 0509  NA 137 136 129*  K 3.1* 4.0 3.2*  CL 98 100 101  CO2 25 21* 22  GLUCOSE 87 140* 172*  BUN 25* 23 15  CREATININE 0.97 0.61 0.57*  CALCIUM 8.1* 7.8* 7.9*  MG  --   --  1.8  PHOS  --   --  1.2*   Liver Function Tests: Recent Labs  Lab 10/28/23 1114 10/29/23 0518 10/30/23 0509  AST 132* 181* 139*  ALT 47* 63* 79*  ALKPHOS 178* 130* 122  BILITOT 3.7* 3.0* 3.2*  PROT 6.3* 5.6* 5.4*  ALBUMIN 3.2* 2.7* 2.7*   CBG: Recent Labs  Lab 10/29/23 1205 10/29/23 1701 10/29/23 2204 10/30/23 0812 10/30/23 1311  GLUCAP 183* 269* 203* 172* 172*    Discharge time spent:  38 minutes.  Signed: Ezzard Holms, MD [facility] 10/30/2023

## 2023-10-30 NOTE — Progress Notes (Signed)
 Physical Therapy Treatment Patient Details Name: [name] MRN: [id number] DOB: 07/10/1950 Today's Date: 10/30/2023   History of Present Illness [name] is a 63 y.o. male with medical history significant of alcohol abuse, chronic pain syndrome on chronic narcotic therapy, IDDM, HTN, brought in by family member for evaluation of altered mentations.  MD assessment includes: severe sepsis, acute metabolic encephalopathy, acute probably on chronic alcoholic hepatitis, mild rhabdomyelitis, and hypokalemia.  Per neurologist note on 4/23 pt with cerebellar infarct: "On revisiting his initial head CT, I do think that the infarct was present on arrival, it is located in a place that is prone to streak artifact, and therefore was very difficult prior to knowing it was there, but on revisiting it I do think it was definitely present on arrival".    PT Comments  Pt lethargic upon entering room but pt awakens easily with minimal verbal and tactile stimulation.  Pt with increased confusion and decreased ability to communicate; pt talkative at times during the session but unable to clearly express ideas verbally.  Pt required increased physical assistance with bed mobility tasks compared to prior session with log roll training provided for decreased caregiver assistance.  Once in sitting at the EOB pt required occasional min A to prevent L lateral LOB with frequent cuing provided for pt to self-correct lean.  Transfer attempts deferred due to decline in functional and cognitive status along with poor static sitting balance, nurse in room and aware.  Pt will benefit from continued PT services upon discharge to safely address deficits listed in patient problem list for decreased caregiver assistance and eventual return to PLOF.    If plan is discharge home, recommend the following: A little help with walking and/or transfers;A little help with bathing/dressing/bathroom;Assist for transportation;Help with stairs or  ramp for entrance;Assistance with cooking/housework   Can travel by private vehicle     No  Equipment Recommendations  Other (comment) (TBD at next venue of care)    Recommendations for Other Services       Precautions / Restrictions Precautions Precautions: Fall Recall of Precautions/Restrictions: Impaired Precaution/Restrictions Comments: Seizure precautions Restrictions Weight Bearing Restrictions Per Provider Order: No Other Position/Activity Restrictions: Per neurologist ok for pt to participate with PT without restrictions but pt to remain in bed at end of session, not to be left in recliner due to seizure precautions     Mobility  Bed Mobility Overal bed mobility: Needs Assistance Bed Mobility: Supine to Sit, Sit to Supine     Supine to sit: Max assist Sit to supine: Max assist   General bed mobility comments: Max A for BLE and trunk control    Transfers                   General transfer comment: NT secondary to poor sitting balance and difficulty following commands    Ambulation/Gait                   Stairs             Wheelchair Mobility     Tilt Bed    Modified Rankin (Stroke Patients Only)       Balance Overall balance assessment: Needs assistance Sitting-balance support: Feet supported, No upper extremity supported Sitting balance-Leahy Scale: Poor Sitting balance - Comments: Frequent min A to prevent L lateral LOB  Communication Communication Communication: Impaired Factors Affecting Communication: Difficulty expressing self;Reduced clarity of speech  Cognition Arousal: Lethargic Behavior During Therapy: Flat affect   PT - Cognitive impairments: Difficult to assess Difficult to assess due to: Level of arousal, Impaired communication                     PT - Cognition Comments: Pt lethargic and presented with difficulty with verbal expression, talkative  during the session but difficult to understand Following commands: Impaired Following commands impaired: Follows one step commands inconsistently    Cueing Cueing Techniques: Verbal cues, Tactile cues, Visual cues  Exercises Other Exercises Other Exercises: Static sitting at EOB for improved activity tolerance and static sitting balance x 10 min Other Exercises: Log roll traininig for decreased caregiver assistance with bed mobility tasks    General Comments        Pertinent Vitals/Pain Pain Assessment Pain Assessment: No/denies pain    Home Living                          Prior Function            PT Goals (current goals can now be found in the care plan section) Progress towards PT goals: PT to reassess next treatment    Frequency    Min 2X/week      PT Plan      Co-evaluation              AM-PAC PT "6 Clicks" Mobility   Outcome Measure  Help needed turning from your back to your side while in a flat bed without using bedrails?: A Lot Help needed moving from lying on your back to sitting on the side of a flat bed without using bedrails?: A Lot Help needed moving to and from a bed to a chair (including a wheelchair)?: A Lot Help needed standing up from a chair using your arms (e.g., wheelchair or bedside chair)?: A Lot Help needed to walk in hospital room?: Total Help needed climbing 3-5 steps with a railing? : Total 6 Click Score: 10    End of Session Equipment Utilized During Treatment: Gait belt Activity Tolerance: Patient tolerated treatment well Patient left: in bed;with bed alarm set;with call bell/phone within reach;with nursing/sitter in room Nurse Communication: Mobility status;Other (comment) (Nursing in room at end of session, aware of decreased functional mobility, balance, and verbal expression) PT Visit Diagnosis: Unsteadiness on feet (R26.81);Difficulty in walking, not elsewhere classified (R26.2);Muscle weakness (generalized)  (M62.81)     Time: 4098-1191 PT Time Calculation (min) (ACUTE ONLY): 28 min  Charges:    $Therapeutic Activity: 23-37 mins PT General Charges $$ ACUTE PT VISIT: 1 Visit                    D. Scott Jaqwon Manfred PT, DPT 10/30/23, 4:20 PM

## 2023-10-30 NOTE — Progress Notes (Signed)
 PHARMACIST - PHYSICIAN COMMUNICATION  DR:   Mariella Shore  CONCERNING: IV to Oral Route Change Policy  RECOMMENDATION: This patient is receiving Pantoprazole  by the intravenous route.  Based on criteria approved by the Pharmacy and Therapeutics Committee, the intravenous medication(s) is/are being converted to the equivalent oral dose form(s).   DESCRIPTION: These criteria include: The patient is eating (either orally or via tube) and/or has been taking other orally administered medications for a least 24 hours The patient has no evidence of active gastrointestinal bleeding or impaired GI absorption (gastrectomy, short bowel, patient on TNA or NPO).  Shantele Reller Rodriguez-Guzman PharmD, BCPS 10/30/2023 10:31 AM

## 2023-10-30 NOTE — TOC Initial Note (Addendum)
 Transition of Care [facility]) - Initial/Assessment Note    Patient Details  Name: [name] MRN: [id number] Date of Birth: 06/12/1951  Transition of Care [facility]) CM/SW Contact:    Jamone Garrido C Rosaura Bolon, RN Phone Number: 10/30/2023, 2:41 PM  Clinical Narrative:                 Spoke with patient at bedside regarding therapy's recommendation for [facility] PT/OT. He stated he felt like he would be ok.Patient not agreeable to [facility]. His wife or daughter will transport him home at discharge.   Substance abuse information added to AVS.          Patient Goals and CMS Choice            Expected Discharge Plan and Services                                              Prior Living Arrangements/Services                       Activities of Daily Living   ADL Screening (condition at time of admission) Independently performs ADLs?: No Does the patient have a NEW difficulty with bathing/dressing/toileting/self-feeding that is expected to last >3 days?: Yes (Initiates electronic notice to provider for possible OT consult) Does the patient have a NEW difficulty with getting in/out of bed, walking, or climbing stairs that is expected to last >3 days?: Yes (Initiates electronic notice to provider for possible PT consult) Does the patient have a NEW difficulty with communication that is expected to last >3 days?: Yes (Initiates electronic notice to provider for possible SLP consult) Is the patient deaf or have difficulty hearing?: Yes Does the patient have difficulty seeing, even when wearing glasses/contacts?: No Does the patient have difficulty concentrating, remembering, or making decisions?: No  Permission Sought/Granted                  Emotional Assessment              Admission diagnosis:  Elevated CK [R74.8] Sepsis (HCC) [A41.9] Multifocal pneumonia [J18.9] Sepsis, due to unspecified organism, unspecified whether acute organ dysfunction present [facility]) [A41.9] Patient  Active Problem List   Diagnosis Date Noted   Acute metabolic encephalopathy 10/29/2023   Severe sepsis (HCC) 10/28/2023   Aspiration pneumonia (HCC) 10/28/2023   Alcoholic cirrhosis of liver with ascites (HCC) 10/28/2023   PCP:  Lamon Pillow, MD Pharmacy:   [facility] [phone] - [address], [place] - [address] AT [facility] [address] [place] [address] Phone: [phone] Fax: [phone]     Social Drivers of Health (SDOH) Social History: SDOH Screenings   Food Insecurity: No Food Insecurity (10/28/2023)  Housing: Low Risk  (10/28/2023)  Transportation Needs: No Transportation Needs (10/28/2023)  Utilities: Not At Risk (10/28/2023)  Social Connections: Socially Integrated (10/28/2023)  Tobacco Use: Medium Risk (10/28/2023)   SDOH Interventions:     Readmission Risk Interventions     No data to display

## 2023-10-30 NOTE — Progress Notes (Signed)
 I spoke with Pt's spouse, Ms Vince Ainsley by phone. Update was given, all questions were answered. She expressed appreciations. She will call again in the morning.   Brain Cahill, RN

## 2023-10-30 NOTE — Plan of Care (Signed)
 Problem: Education: Goal: Knowledge of General Education information will improve Description: Including pain rating scale, medication(s)/side effects and non-pharmacologic comfort measures Outcome: Progressing   Problem: Health Behavior/Discharge Planning: Goal: Ability to manage health-related needs will improve Outcome: Progressing   Problem: Clinical Measurements: Goal: Ability to maintain clinical measurements within normal limits will improve Outcome: Progressing Goal: Will remain free from infection Outcome: Progressing Goal: Diagnostic test results will improve Outcome: Progressing Goal: Respiratory complications will improve Outcome: Progressing Goal: Cardiovascular complication will be avoided Outcome: Progressing   Problem: Activity: Goal: Risk for activity intolerance will decrease Outcome: Progressing   Problem: Nutrition: Goal: Adequate nutrition will be maintained Outcome: Progressing   Problem: Coping: Goal: Level of anxiety will decrease Outcome: Progressing   Problem: Elimination: Goal: Will not experience complications related to bowel motility Outcome: Progressing Goal: Will not experience complications related to urinary retention Outcome: Progressing   Problem: Pain Managment: Goal: General experience of comfort will improve and/or be controlled Outcome: Progressing   Problem: Safety: Goal: Ability to remain free from injury will improve Outcome: Progressing   Problem: Skin Integrity: Goal: Risk for impaired skin integrity will decrease Outcome: Progressing   Problem: Education: Goal: Ability to describe self-care measures that may prevent or decrease complications (Diabetes Survival Skills Education) will improve Outcome: Progressing Goal: Individualized Educational Video(s) Outcome: Progressing   Problem: Coping: Goal: Ability to adjust to condition or change in health will improve Outcome: Progressing   Problem: Fluid  Volume: Goal: Ability to maintain a balanced intake and output will improve Outcome: Progressing   Problem: Health Behavior/Discharge Planning: Goal: Ability to identify and utilize available resources and services will improve Outcome: Progressing Goal: Ability to manage health-related needs will improve Outcome: Progressing   Problem: Metabolic: Goal: Ability to maintain appropriate glucose levels will improve Outcome: Progressing   Problem: Nutritional: Goal: Maintenance of adequate nutrition will improve Outcome: Progressing Goal: Progress toward achieving an optimal weight will improve Outcome: Progressing   Problem: Skin Integrity: Goal: Risk for impaired skin integrity will decrease Outcome: Progressing   Problem: Tissue Perfusion: Goal: Adequacy of tissue perfusion will improve Outcome: Progressing   Problem: Education: Goal: Knowledge of disease or condition will improve Outcome: Progressing Goal: Knowledge of secondary prevention will improve (MUST DOCUMENT ALL) Outcome: Progressing Goal: Knowledge of patient specific risk factors will improve (DELETE if not current risk factor) Outcome: Progressing   Problem: Intracerebral Hemorrhage Tissue Perfusion: Goal: Complications of Intracerebral Hemorrhage will be minimized Outcome: Progressing   Problem: Coping: Goal: Will verbalize positive feelings about self Outcome: Progressing Goal: Will identify appropriate support needs Outcome: Progressing   Problem: Health Behavior/Discharge Planning: Goal: Ability to manage health-related needs will improve Outcome: Progressing Goal: Goals will be collaboratively established with patient/family Outcome: Progressing   Problem: Self-Care: Goal: Ability to participate in self-care as condition permits will improve Outcome: Progressing Goal: Verbalization of feelings and concerns over difficulty with self-care will improve Outcome: Progressing Goal: Ability to  communicate needs accurately will improve Outcome: Progressing   Problem: Nutrition: Goal: Risk of aspiration will decrease Outcome: Progressing Goal: Dietary intake will improve Outcome: Progressing   Problem: Education: Goal: Knowledge of disease or condition will improve Outcome: Progressing Goal: Knowledge of secondary prevention will improve (MUST DOCUMENT ALL) Outcome: Progressing Goal: Knowledge of patient specific risk factors will improve (DELETE if not current risk factor) Outcome: Progressing   Problem: Ischemic Stroke/TIA Tissue Perfusion: Goal: Complications of ischemic stroke/TIA will be minimized Outcome: Progressing   Problem: Coping: Goal: Will verbalize positive feelings  about self Outcome: Progressing Goal: Will identify appropriate support needs Outcome: Progressing   Problem: Health Behavior/Discharge Planning: Goal: Ability to manage health-related needs will improve Outcome: Progressing Goal: Goals will be collaboratively established with patient/family Outcome: Progressing

## 2023-10-30 NOTE — Progress Notes (Signed)
 Occupational Therapy Treatment Patient Details Name: [name] MRN: [id number] DOB: 10/12/1950 Today's Date: 10/30/2023   History of present illness [name] is a 63 y.o. male with medical history significant of alcohol abuse, chronic pain syndrome on chronic narcotic therapy, IDDM, HTN, brought in by family member for evaluation of altered mentations.  MD assessment includes: severe sepsis, acute metabolic encephalopathy, acute probably on chronic alcoholic hepatitis, mild rhabdomyelitis, and hypokalemia.   OT comments  [name] was seen for OT treatment on this date. Upon arrival to room pt in bed, oriented to self and time only and found to have soiled sheets from bowel/bladder incontinence. Significantly decreased command following compared to prior session. Per nurse pt recently received ativan .   Pt requires MAX A for bed mobility and pericare. Poor sitting balance/tolerance, unsafe to attempt transfers. Pt making limited progress toward goals, will re-assess next session. Discharge recommendation updated to reflect increased needs.        If plan is discharge home, recommend the following:  A little help with walking and/or transfers;Help with stairs or ramp for entrance   Equipment Recommendations  BSC/3in1;Other (comment) (RW)    Recommendations for Other Services      Precautions / Restrictions Precautions Precautions: Fall Recall of Precautions/Restrictions: Impaired Precaution/Restrictions Comments: Seizure precautions Restrictions Weight Bearing Restrictions Per Provider Order: No       Mobility Bed Mobility Overal bed mobility: Needs Assistance Bed Mobility: Supine to Sit, Rolling, Sit to Supine Rolling: Mod assist   Supine to sit: Max assist Sit to supine: Max assist        Transfers                   General transfer comment: unsafe to attempt     Balance Overall balance assessment: Needs assistance Sitting-balance support: Feet supported,  Bilateral upper extremity supported Sitting balance-Leahy Scale: Poor                                     ADL either performed or assessed with clinical judgement   ADL Overall ADL's : Needs assistance/impaired                                       General ADL Comments: MAX A pericare bed level     Communication Communication Communication: Impaired   Cognition Arousal: Alert Behavior During Therapy: Impulsive Cognition: Cognition impaired             OT - Cognition Comments: oriented to self only, difficulty attending to task or engaging in appropriate conversation                 Following commands: Impaired Following commands impaired: Follows one step commands inconsistently      Cueing   Cueing Techniques: Verbal cues, Tactile cues, Visual cues             Pertinent Vitals/ Pain       Pain Assessment Pain Assessment: Faces Faces Pain Scale: Hurts a little bit Pain Location: B shoulders with ROM Pain Descriptors / Indicators: Dull, Grimacing Pain Intervention(s): Limited activity within patient's tolerance, Repositioned   Frequency  Min 2X/week        Progress Toward Goals  OT Goals(current goals can now be found in the care plan section)     Acute  Rehab OT Goals OT Goal Formulation: With patient Time For Goal Achievement: 11/12/23 Potential to Achieve Goals: Good ADL Goals Pt Will Perform Grooming: with modified independence;standing Pt Will Perform Lower Body Dressing: with modified independence;sit to/from stand Pt Will Transfer to Toilet: with modified independence;ambulating;regular height toilet  Plan      Co-evaluation                 AM-PAC OT "6 Clicks" Daily Activity     Outcome Measure   Help from another person eating meals?: None Help from another person taking care of personal grooming?: A Little Help from another person toileting, which includes using toliet, bedpan, or  urinal?: A Lot Help from another person bathing (including washing, rinsing, drying)?: A Lot Help from another person to put on and taking off regular upper body clothing?: A Little Help from another person to put on and taking off regular lower body clothing?: A Lot 6 Click Score: 16    End of Session    OT Visit Diagnosis: Other abnormalities of gait and mobility (R26.89);Muscle weakness (generalized) (M62.81)   Activity Tolerance Patient tolerated treatment well   Patient Left in bed;with call bell/phone within reach;with bed alarm set;with nursing/sitter in room   Nurse Communication Mobility status        Time: 1478-2956 OT Time Calculation (min): 20 min  Charges: OT General Charges $OT Visit: 1 Visit OT Treatments $Self Care/Home Management : 8-22 mins  Gordan Latina, M.S. OTR/L  10/30/23, 2:24 PM  ascom [phone]

## 2023-10-30 NOTE — Plan of Care (Signed)
   Problem: Education: Goal: Knowledge of General Education information will improve Description Including pain rating scale, medication(s)/side effects and non-pharmacologic comfort measures Outcome: Progressing

## 2023-10-30 NOTE — Progress Notes (Signed)
 NEUROLOGY CONSULT FOLLOW UP NOTE   Date of service: October 30, 2023 Patient Name: Jaizon Deroos MRN:  [id number] DOB:  01/07/1951  Interval Hx/subjective   Patient to conitnues to have some confusion, mri revealed stroke.   Vitals   Vitals:   10/30/23 0138 10/30/23 0424 10/30/23 0813 10/30/23 1310  BP: 114/68 111/72 (!) 140/77 113/71  Pulse: 93 90 (!) 105 100  Resp: 20 20 16 16   Temp: (!) 100.6 F (38.1 C) 99.3 F (37.4 C) 98.9 F (37.2 C) 99.5 F (37.5 C)  TempSrc: Oral     SpO2: 100% 95% 95% 93%  Weight:      Height:         Body mass index is 22.6 kg/m.  Physical Exam   Constitutional: Appears well-developed and well-nourished.  Neurologic Examination    MS: He is awake, alert, knows that he is in the hospital in [place] but not what month it is and gives the year as "1923" CN: Visual fields are full, EOMI, he is somewhat dysarthric Motor: No drift in either upper extremity Sensory: Intact to light touch He performs finger-nose-finger slowly bilaterally, but does so without significant dysmetria  Medications  Current Facility-Administered Medications:    0.9 %  sodium chloride  infusion, , Intravenous, Continuous, Montey Apa, DO, Last Rate: 100 mL/hr at 10/29/23 1848, Infusion Verify at 10/29/23 1848   acetaminophen  (TYLENOL ) tablet 650 mg, 650 mg, Oral, Q6H PRN, 650 mg at 10/28/23 1746 **OR** acetaminophen  (TYLENOL ) suppository 650 mg, 650 mg, Rectal, Q6H PRN, Antoniette Batty T, MD   allopurinol  (ZYLOPRIM ) tablet 100 mg, 100 mg, Oral, Daily, Jeane Miguel, Ping T, MD, 100 mg at 10/30/23 4098   bisacodyl  (DULCOLAX) EC tablet 5 mg, 5 mg, Oral, Daily PRN, Antoniette Batty T, MD   cefTRIAXone  (ROCEPHIN ) 2 g in sodium chloride  0.9 % 100 mL IVPB, 2 g, Intravenous, Q24H, Antoniette Batty T, MD, Last Rate: 200 mL/hr at 10/29/23 2314, 2 g at 10/29/23 2314   dextromethorphan -guaiFENesin  (MUCINEX  DM) 30-600 MG per 12 hr tablet 1 tablet, 1 tablet, Oral, BID, Darus Engels A, DO, 1 tablet  at 10/30/23 1141   doxycycline  (VIBRAMYCIN ) 100 mg in sodium chloride  0.9 % 250 mL IVPB, 100 mg, Intravenous, Q12H, Darus Engels A, DO, Last Rate: 125 mL/hr at 10/30/23 1151, 100 mg at 10/30/23 1151   enoxaparin  (LOVENOX ) injection 40 mg, 40 mg, Subcutaneous, Q24H, Zhang, Ping T, MD, 40 mg at 10/29/23 2315   folic acid  (FOLVITE ) tablet 1 mg, 1 mg, Oral, Daily, Antoniette Batty T, MD, 1 mg at 10/30/23 1191   hydrALAZINE  (APRESOLINE ) injection 5 mg, 5 mg, Intravenous, Q6H PRN, Antoniette Batty T, MD   HYDROmorphone  (DILAUDID ) injection 0.5-1 mg, 0.5-1 mg, Intravenous, Q2H PRN, Antoniette Batty T, MD   insulin  aspart (novoLOG ) injection 0-15 Units, 0-15 Units, Subcutaneous, TID WC, Antoniette Batty T, MD, 3 Units at 10/30/23 4782   levETIRAcetam  (KEPPRA ) tablet 1,000 mg, 1,000 mg, Oral, BID, Augustin Leber, MD   LORazepam  (ATIVAN ) tablet 1-4 mg, 1-4 mg, Oral, Q1H PRN **OR** LORazepam  (ATIVAN ) injection 1-4 mg, 1-4 mg, Intravenous, Q1H PRN, Antoniette Batty T, MD, 2 mg at 10/30/23 1144   metroNIDAZOLE  (FLAGYL ) IVPB 500 mg, 500 mg, Intravenous, Q12H, Darus Engels A, DO, Last Rate: 100 mL/hr at 10/30/23 1140, 500 mg at 10/30/23 1140   multivitamin with minerals tablet 1 tablet, 1 tablet, Oral, Daily, Antoniette Batty T, MD, 1 tablet at 10/30/23 9562   ondansetron  (ZOFRAN ) tablet 4 mg, 4 mg, Oral,  Q6H PRN **OR** ondansetron  (ZOFRAN ) injection 4 mg, 4 mg, Intravenous, Q6H PRN, Zhang, Ping T, MD   oxyCODONE  (Oxy IR/ROXICODONE ) immediate release tablet 5 mg, 5 mg, Oral, Q6H PRN, Zhang, Ping T, MD   pantoprazole  (PROTONIX ) EC tablet 40 mg, 40 mg, Oral, QPM, Djan, Esther Hem, MD   potassium chloride  SA (KLOR-CON  M) CR tablet 40 mEq, 40 mEq, Oral, Q4H, Djan, Prince T, MD, 40 mEq at 10/30/23 1138   senna-docusate (Senokot-S) tablet 1 tablet, 1 tablet, Oral, QHS PRN, [place], MD   sertraline  (ZOLOFT ) tablet 100 mg, 100 mg, Oral, Daily, Antoniette Batty T, MD, 100 mg at 10/30/23 1610   thiamine  (VITAMIN B1) tablet 100 mg, 100 mg,  Oral, Daily, 100 mg at 10/30/23 0931 **OR** thiamine  (VITAMIN B1) injection 100 mg, 100 mg, Intravenous, Daily, Antoniette Batty T, MD  Labs and Diagnostic Imaging   A1c 5.7  Imaging(Personally reviewed): MRI brain-cerebellar infarct  On revisiting his initial head CT, I do think that the infarct was present on arrival, it is located in a place that is prone to streak artifact, and therefore was very difficult prior to knowing it was there, but on revisiting it I do think it was definitely present on arrival.  Assessment   Wojciech Willetts is a 63 y.o. male with acute cerebellar infarct, as well as seizures.  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  He was managed on phenobarbital at that time.  He does have a history of alcohol abuse, and had recently started drinking again, but doubt that alcohol withdrawal played a significant role in his seizure.  EEG yesterday recorded a subtle clinical seizure(confusion as only clinical manifestation), and he was started on Keppra .  I am concerned that he may be having ongoing intermittent subclinical seizures.  Without significant improvement overnight, I think that the next step would be transfer for long-term continuous EEG recording.  His stroke is fairly large and peripheral, and therefore embolic etiology is certainly possible and he will need further workup for this.  Recommendations  Increase Keppra  to 1.5 g twice daily Transfer for LTM EEG  Echo, telemetry Lipid panel, high intensity statin if LDL > 70 Pt,ot,st  ______________________________________________________________________   Signed, Ann Keto, MD [facility]

## 2023-10-31 ENCOUNTER — Inpatient Hospital Stay (HOSPITAL_COMMUNITY)

## 2023-10-31 ENCOUNTER — Other Ambulatory Visit: Payer: Self-pay

## 2023-10-31 ENCOUNTER — Inpatient Hospital Stay (HOSPITAL_COMMUNITY)
Admission: RE | Admit: 2023-10-31 | Discharge: 2023-11-05 | DRG: 871 | Disposition: A | Discharge status: Home Health | Source: Other Acute Inpatient Hospital | Attending: Internal Medicine | Admitting: Internal Medicine

## 2023-10-31 ENCOUNTER — Encounter (HOSPITAL_COMMUNITY): Payer: Self-pay | Admitting: Internal Medicine

## 2023-10-31 DIAGNOSIS — K219 Gastro-esophageal reflux disease without esophagitis: Secondary | ICD-10-CM | POA: Insufficient documentation

## 2023-10-31 DIAGNOSIS — I639 Cerebral infarction, unspecified: Principal | ICD-10-CM

## 2023-10-31 DIAGNOSIS — F1090 Alcohol use, unspecified, uncomplicated: Secondary | ICD-10-CM | POA: Insufficient documentation

## 2023-10-31 DIAGNOSIS — E119 Type 2 diabetes mellitus without complications: Secondary | ICD-10-CM

## 2023-10-31 DIAGNOSIS — R569 Unspecified convulsions: Secondary | ICD-10-CM

## 2023-10-31 DIAGNOSIS — E669 Obesity, unspecified: Secondary | ICD-10-CM | POA: Insufficient documentation

## 2023-10-31 DIAGNOSIS — E1169 Type 2 diabetes mellitus with other specified complication: Secondary | ICD-10-CM

## 2023-10-31 DIAGNOSIS — G9340 Encephalopathy, unspecified: Secondary | ICD-10-CM | POA: Diagnosis not present

## 2023-10-31 DIAGNOSIS — K7031 Alcoholic cirrhosis of liver with ascites: Secondary | ICD-10-CM | POA: Diagnosis present

## 2023-10-31 DIAGNOSIS — I10 Essential (primary) hypertension: Secondary | ICD-10-CM | POA: Insufficient documentation

## 2023-10-31 DIAGNOSIS — D649 Anemia, unspecified: Secondary | ICD-10-CM | POA: Diagnosis not present

## 2023-10-31 DIAGNOSIS — J690 Pneumonitis due to inhalation of food and vomit: Secondary | ICD-10-CM | POA: Diagnosis present

## 2023-10-31 DIAGNOSIS — G9341 Metabolic encephalopathy: Secondary | ICD-10-CM | POA: Diagnosis present

## 2023-10-31 DIAGNOSIS — F32A Depression, unspecified: Secondary | ICD-10-CM | POA: Insufficient documentation

## 2023-10-31 DIAGNOSIS — Z794 Long term (current) use of insulin: Secondary | ICD-10-CM | POA: Diagnosis not present

## 2023-10-31 DIAGNOSIS — Z8673 Personal history of transient ischemic attack (TIA), and cerebral infarction without residual deficits: Secondary | ICD-10-CM | POA: Insufficient documentation

## 2023-10-31 DIAGNOSIS — Z87820 Personal history of traumatic brain injury: Secondary | ICD-10-CM | POA: Diagnosis not present

## 2023-10-31 DIAGNOSIS — Z9181 History of falling: Secondary | ICD-10-CM

## 2023-10-31 DIAGNOSIS — J9811 Atelectasis: Secondary | ICD-10-CM | POA: Diagnosis not present

## 2023-10-31 DIAGNOSIS — D731 Hypersplenism: Secondary | ICD-10-CM | POA: Diagnosis present

## 2023-10-31 DIAGNOSIS — Z79899 Other long term (current) drug therapy: Secondary | ICD-10-CM | POA: Diagnosis not present

## 2023-10-31 DIAGNOSIS — Z87891 Personal history of nicotine dependence: Secondary | ICD-10-CM

## 2023-10-31 DIAGNOSIS — G894 Chronic pain syndrome: Secondary | ICD-10-CM | POA: Diagnosis not present

## 2023-10-31 DIAGNOSIS — W19XXXA Unspecified fall, initial encounter: Secondary | ICD-10-CM | POA: Diagnosis not present

## 2023-10-31 DIAGNOSIS — F1120 Opioid dependence, uncomplicated: Secondary | ICD-10-CM | POA: Diagnosis present

## 2023-10-31 DIAGNOSIS — Z1152 Encounter for screening for COVID-19: Secondary | ICD-10-CM | POA: Diagnosis not present

## 2023-10-31 DIAGNOSIS — Z888 Allergy status to other drugs, medicaments and biological substances status: Secondary | ICD-10-CM

## 2023-10-31 DIAGNOSIS — R918 Other nonspecific abnormal finding of lung field: Secondary | ICD-10-CM | POA: Diagnosis not present

## 2023-10-31 DIAGNOSIS — R4182 Altered mental status, unspecified: Secondary | ICD-10-CM | POA: Diagnosis present

## 2023-10-31 DIAGNOSIS — A419 Sepsis, unspecified organism: Principal | ICD-10-CM | POA: Diagnosis present

## 2023-10-31 DIAGNOSIS — D6959 Other secondary thrombocytopenia: Secondary | ICD-10-CM | POA: Diagnosis present

## 2023-10-31 DIAGNOSIS — I6389 Other cerebral infarction: Secondary | ICD-10-CM | POA: Diagnosis not present

## 2023-10-31 DIAGNOSIS — K7011 Alcoholic hepatitis with ascites: Secondary | ICD-10-CM | POA: Diagnosis present

## 2023-10-31 DIAGNOSIS — E876 Hypokalemia: Secondary | ICD-10-CM | POA: Diagnosis present

## 2023-10-31 DIAGNOSIS — F1010 Alcohol abuse, uncomplicated: Secondary | ICD-10-CM | POA: Diagnosis not present

## 2023-10-31 DIAGNOSIS — R0600 Dyspnea, unspecified: Secondary | ICD-10-CM | POA: Diagnosis not present

## 2023-10-31 DIAGNOSIS — F1021 Alcohol dependence, in remission: Secondary | ICD-10-CM

## 2023-10-31 HISTORY — DX: Type 2 diabetes mellitus without complications: E11.9

## 2023-10-31 HISTORY — DX: Essential (primary) hypertension: I10

## 2023-10-31 LAB — RESPIRATORY PANEL BY PCR

## 2023-10-31 LAB — LIPID PANEL
Cholesterol: 46 mg/dL (ref 0–200)
HDL: 24 mg/dL — ABNORMAL LOW (ref >40)
LDL Cholesterol: 15 mg/dL (ref 0–99)
Total CHOL/HDL Ratio: 1.9 ratio
Triglycerides: 35 mg/dL (ref <150)
VLDL: 7 mg/dL (ref 0–40)

## 2023-10-31 LAB — CULTURE, BLOOD (ROUTINE X 2)

## 2023-10-31 LAB — BASIC METABOLIC PANEL WITH GFR
Anion gap: 10 (ref 5–15)
BUN: 14 mg/dL (ref 8–23)
CO2: 20 mmol/L — ABNORMAL LOW (ref 22–32)
Calcium: 8 mg/dL — ABNORMAL LOW (ref 8.9–10.3)
Chloride: 106 mmol/L (ref 98–111)
Creatinine, Ser: 0.48 mg/dL — ABNORMAL LOW (ref 0.61–1.24)
GFR, Estimated: >60 mL/min (ref >60)
Glucose, Bld: 116 mg/dL — ABNORMAL HIGH (ref 70–99)
Potassium: 3.1 mmol/L — ABNORMAL LOW (ref 3.5–5.1)
Sodium: 136 mmol/L (ref 135–145)

## 2023-10-31 LAB — GLUCOSE, CAPILLARY
Glucose-Capillary: 118 mg/dL — ABNORMAL HIGH (ref 70–99)
Glucose-Capillary: 138 mg/dL — ABNORMAL HIGH (ref 70–99)
Glucose-Capillary: 112 mg/dL — ABNORMAL HIGH (ref 70–99)
Glucose-Capillary: 113 mg/dL — ABNORMAL HIGH (ref 70–99)

## 2023-10-31 LAB — CBC WITH DIFFERENTIAL/PLATELET
Abs Immature Granulocytes: 0.07 10*3/uL (ref 0.00–0.07)
Basophils Absolute: 0 10*3/uL (ref 0.0–0.1)
Basophils Relative: 0 %
Eosinophils Absolute: 0.2 10*3/uL (ref 0.0–0.5)
Eosinophils Relative: 2 %
HCT: 34.5 % — ABNORMAL LOW (ref 39.0–52.0)
Hemoglobin: 12.5 g/dL — ABNORMAL LOW (ref 13.0–17.0)
Immature Granulocytes: 1 %
Lymphocytes Relative: 10 %
Lymphs Abs: 0.8 10*3/uL (ref 0.7–4.0)
MCH: 34.3 pg — ABNORMAL HIGH (ref 26.0–34.0)
MCHC: 36.2 g/dL — ABNORMAL HIGH (ref 30.0–36.0)
MCV: 94.8 fL (ref 80.0–100.0)
Monocytes Absolute: 0.9 10*3/uL (ref 0.1–1.0)
Monocytes Relative: 12 %
Neutro Abs: 6.1 10*3/uL (ref 1.7–7.7)
Neutrophils Relative %: 75 %
Platelets: 83 10*3/uL — ABNORMAL LOW (ref 150–400)
RBC: 3.64 MIL/uL — ABNORMAL LOW (ref 4.22–5.81)
RDW: 14.3 % (ref 11.5–15.5)
WBC: 8 10*3/uL (ref 4.0–10.5)
nRBC: 0 % (ref 0.0–0.2)

## 2023-10-31 LAB — PROCALCITONIN: Procalcitonin: 0.69 ng/mL

## 2023-10-31 MED ORDER — THIAMINE HCL 100 MG/ML IJ SOLN
100.0000 mg | Freq: Every day | INTRAMUSCULAR | Status: DC
  Filled 2023-10-31: qty 2

## 2023-10-31 MED ORDER — OXYCODONE HCL 5 MG PO TABS: 5.0000 mg | ORAL_TABLET | Freq: Four times a day (QID) | ORAL | Status: DC | PRN

## 2023-10-31 MED ORDER — LORAZEPAM 2 MG/ML IJ SOLN: 1.0000 mg | INTRAMUSCULAR | Status: AC | PRN

## 2023-10-31 MED ORDER — SERTRALINE HCL 100 MG PO TABS
100.0000 mg | ORAL_TABLET | Freq: Every day | ORAL | Status: DC
  Administered 2023-11-01 – 2023-11-05 (×5): 100 mg via ORAL
  Filled 2023-10-31 (×5): qty 1

## 2023-10-31 MED ORDER — SODIUM CHLORIDE 0.9 % IV SOLN
2.0000 g | Freq: Three times a day (TID) | INTRAVENOUS | Status: DC
  Administered 2023-10-31 – 2023-11-01 (×3): 2 g via INTRAVENOUS
  Filled 2023-10-31 (×4): qty 12.5

## 2023-10-31 MED ORDER — DM-GUAIFENESIN ER 30-600 MG PO TB12
1.0000 | ORAL_TABLET | Freq: Two times a day (BID) | ORAL | Status: DC
  Administered 2023-10-31 – 2023-11-05 (×10): 1 via ORAL
  Filled 2023-10-31 (×10): qty 1

## 2023-10-31 MED ORDER — SENNOSIDES-DOCUSATE SODIUM 8.6-50 MG PO TABS: 1.0000 | ORAL_TABLET | Freq: Every evening | ORAL | Status: DC | PRN

## 2023-10-31 MED ORDER — SODIUM CHLORIDE 0.9 % IV SOLN: 2.0000 g | INTRAVENOUS | Status: DC

## 2023-10-31 MED ORDER — THIAMINE MONONITRATE 100 MG PO TABS
100.0000 mg | ORAL_TABLET | Freq: Every day | ORAL | Status: DC
  Administered 2023-11-01: 100 mg via ORAL
  Filled 2023-10-31: qty 1

## 2023-10-31 MED ORDER — VANCOMYCIN HCL 750 MG/150ML IV SOLN
750.0000 mg | Freq: Two times a day (BID) | INTRAVENOUS | Status: DC
  Administered 2023-11-02: 750 mg via INTRAVENOUS
  Filled 2023-10-31 (×4): qty 150

## 2023-10-31 MED ORDER — VANCOMYCIN HCL 1250 MG/250ML IV SOLN
1250.0000 mg | Freq: Once | INTRAVENOUS | Status: DC
  Filled 2023-10-31: qty 250

## 2023-10-31 MED ORDER — INSULIN ASPART 100 UNIT/ML IJ SOLN
0.0000 [IU] | Freq: Three times a day (TID) | INTRAMUSCULAR | Status: DC
  Administered 2023-11-01 (×2): 2 [IU] via SUBCUTANEOUS
  Administered 2023-11-01: 5 [IU] via SUBCUTANEOUS
  Administered 2023-11-02: 3 [IU] via SUBCUTANEOUS
  Administered 2023-11-02: 5 [IU] via SUBCUTANEOUS
  Administered 2023-11-03 (×2): 3 [IU] via SUBCUTANEOUS
  Administered 2023-11-03 – 2023-11-04 (×3): 2 [IU] via SUBCUTANEOUS
  Administered 2023-11-04 – 2023-11-05 (×2): 3 [IU] via SUBCUTANEOUS

## 2023-10-31 MED ORDER — PANTOPRAZOLE SODIUM 40 MG PO TBEC
40.0000 mg | DELAYED_RELEASE_TABLET | Freq: Every evening | ORAL | Status: DC
  Administered 2023-10-31 – 2023-11-04 (×5): 40 mg via ORAL
  Filled 2023-10-31 (×5): qty 1

## 2023-10-31 MED ORDER — METRONIDAZOLE 500 MG/100ML IV SOLN
500.0000 mg | Freq: Two times a day (BID) | INTRAVENOUS | Status: DC
  Administered 2023-11-01 (×3): 500 mg via INTRAVENOUS
  Filled 2023-10-31 (×3): qty 100

## 2023-10-31 MED ORDER — ACETAMINOPHEN 325 MG PO TABS
650.0000 mg | ORAL_TABLET | Freq: Four times a day (QID) | ORAL | Status: DC | PRN
  Administered 2023-10-31: 650 mg via ORAL
  Filled 2023-10-31: qty 2

## 2023-10-31 MED ORDER — ENOXAPARIN SODIUM 40 MG/0.4ML IJ SOSY: 40.0000 mg | PREFILLED_SYRINGE | INTRAMUSCULAR | Status: DC

## 2023-10-31 MED ORDER — BISACODYL 5 MG PO TBEC
5.0000 mg | DELAYED_RELEASE_TABLET | Freq: Every day | ORAL | Status: DC | PRN
Start: 2023-10-31 — End: 2023-11-05
  Administered 2023-11-02: 5 mg via ORAL
  Filled 2023-10-31: qty 1

## 2023-10-31 MED ORDER — HYDRALAZINE HCL 20 MG/ML IJ SOLN: 5.0000 mg | Freq: Four times a day (QID) | INTRAMUSCULAR | Status: DC | PRN

## 2023-10-31 MED ORDER — SODIUM CHLORIDE 0.9 % IV SOLN
100.0000 mg | Freq: Two times a day (BID) | INTRAVENOUS | Status: DC
  Filled 2023-10-31: qty 100

## 2023-10-31 MED ORDER — HYDROMORPHONE HCL 1 MG/ML IJ SOLN: 0.5000 mg | INTRAMUSCULAR | Status: DC | PRN

## 2023-10-31 MED ORDER — ONDANSETRON HCL 4 MG PO TABS: 4.0000 mg | ORAL_TABLET | Freq: Four times a day (QID) | ORAL | Status: DC | PRN

## 2023-10-31 MED ORDER — ONDANSETRON HCL 4 MG/2ML IJ SOLN: 4.0000 mg | Freq: Four times a day (QID) | INTRAMUSCULAR | Status: DC | PRN

## 2023-10-31 MED ORDER — VANCOMYCIN HCL 1.25 G IV SOLR
1250.0000 mg | Freq: Once | INTRAVENOUS | Status: AC
  Administered 2023-11-01: 1250 mg via INTRAVENOUS
  Filled 2023-10-31: qty 1250

## 2023-10-31 MED ORDER — ACETAMINOPHEN 650 MG RE SUPP: 650.0000 mg | Freq: Four times a day (QID) | RECTAL | Status: DC | PRN

## 2023-10-31 MED ORDER — ENOXAPARIN SODIUM 40 MG/0.4ML IJ SOSY
40.0000 mg | PREFILLED_SYRINGE | INTRAMUSCULAR | Status: DC
  Administered 2023-10-31 – 2023-11-04 (×5): 40 mg via SUBCUTANEOUS
  Filled 2023-10-31 (×5): qty 0.4

## 2023-10-31 MED ORDER — SODIUM CHLORIDE 0.9% FLUSH
3.0000 mL | Freq: Two times a day (BID) | INTRAVENOUS | Status: DC
  Administered 2023-10-31 – 2023-11-05 (×10): 3 mL via INTRAVENOUS

## 2023-10-31 MED ORDER — FOLIC ACID 1 MG PO TABS
1.0000 mg | ORAL_TABLET | Freq: Every day | ORAL | Status: DC
  Administered 2023-11-01 – 2023-11-05 (×5): 1 mg via ORAL
  Filled 2023-10-31 (×5): qty 1

## 2023-10-31 MED ORDER — POTASSIUM CHLORIDE CRYS ER 20 MEQ PO TBCR
40.0000 meq | EXTENDED_RELEASE_TABLET | ORAL | Status: DC
  Administered 2023-10-31: 40 meq via ORAL
  Filled 2023-10-31: qty 2

## 2023-10-31 MED ORDER — ALLOPURINOL 100 MG PO TABS
100.0000 mg | ORAL_TABLET | Freq: Every day | ORAL | Status: DC
  Administered 2023-11-01 – 2023-11-05 (×5): 100 mg via ORAL
  Filled 2023-10-31 (×5): qty 1

## 2023-10-31 MED ORDER — LORAZEPAM 1 MG PO TABS: 1.0000 mg | ORAL_TABLET | ORAL | Status: AC | PRN

## 2023-10-31 MED ORDER — ADULT MULTIVITAMIN W/MINERALS CH
1.0000 | ORAL_TABLET | Freq: Every day | ORAL | Status: DC
  Administered 2023-11-01 – 2023-11-05 (×5): 1 via ORAL
  Filled 2023-10-31 (×5): qty 1

## 2023-10-31 MED ORDER — LEVETIRACETAM 500 MG PO TABS
1500.0000 mg | ORAL_TABLET | Freq: Two times a day (BID) | ORAL | Status: DC
  Administered 2023-10-31 – 2023-11-05 (×10): 1500 mg via ORAL
  Filled 2023-10-31 (×10): qty 3

## 2023-10-31 NOTE — TOC Transition Note (Signed)
 Transition of Care [facility]) - Discharge Note   Patient Details  Name: [name] MRN: [id number] Date of Birth: 03/20/1951  Transition of Care [facility] CM/SW Contact:  Geralda Baumgardner C Kagan Mutchler, RN Phone Number: 10/31/2023, 11:01 AM   Clinical Narrative:    Patient discharging to [facility] for continuous EEG monitoring per discharge summary.   TOC signing off.            Patient Goals and CMS Choice            Discharge Placement                       Discharge Plan and Services Additional resources added to the After Visit Summary for                                       Social Drivers of Health (SDOH) Interventions SDOH Screenings   Food Insecurity: No Food Insecurity (10/28/2023)  Housing: Low Risk  (10/28/2023)  Transportation Needs: No Transportation Needs (10/28/2023)  Utilities: Not At Risk (10/28/2023)  Social Connections: Socially Integrated (10/28/2023)  Tobacco Use: Medium Risk (10/28/2023)     Readmission Risk Interventions     No data to display

## 2023-10-31 NOTE — Progress Notes (Signed)
 NEUROLOGY CONSULT FOLLOW UP NOTE   Date of service: October 31, 2023 Patient Name: [name] MRN:  [id number] DOB:  04/08/1951  Interval Hx/subjective   Family yesterday still reported episodic confusion, seems slightly better today, but since it is episodic difficult to know if I just called him at Moss good time.  Vitals   Vitals:   10/30/23 2025 10/30/23 2344 10/31/23 0423 10/31/23 0922  BP: 102/69 107/68 110/71 123/75  Pulse: 76 73 86 96  Resp: 20 20 18 18   Temp: 99.7 F (37.6 C) 98.8 F (37.1 C) 99.4 F (37.4 C) 99.2 F (37.3 C)  TempSrc: Oral Oral Oral   SpO2: 94% 94% 93% 94%  Weight:      Height:         Body mass index is 22.6 kg/m.  Physical Exam   Constitutional: Appears well-developed and well-nourished.  Neurologic Examination    MS: He is awake, alert, knows that he is in the hospital in [place], and is able to give the month, he gives the year is 2024 CN: Visual fields are full, EOMI, he is somewhat dysarthric Motor: No drift in either upper extremity Sensory: Intact to light touch He performs finger-nose-finger slowly bilaterally, but does so without significant dysmetria  Medications  Current Facility-Administered Medications:     stroke: early stages of recovery book, , Does not apply, Once, James Leber, MD   acetaminophen  (TYLENOL ) tablet 650 mg, 650 mg, Oral, Q6H PRN, 650 mg at 10/28/23 1746 **OR** acetaminophen  (TYLENOL ) suppository 650 mg, 650 mg, Rectal, Q6H PRN, James Batty T, MD, 650 mg at 10/30/23 1603   allopurinol  (ZYLOPRIM ) tablet 100 mg, 100 mg, Oral, Daily, James Batty T, MD, 100 mg at 10/31/23 2952   bisacodyl  (DULCOLAX) EC tablet 5 mg, 5 mg, Oral, Daily PRN, Zhang, Ping T, MD   cefTRIAXone  (ROCEPHIN ) 2 g in sodium chloride  0.9 % 100 mL IVPB, 2 g, Intravenous, Q24H, James Batty T, MD, Last Rate: 200 mL/hr at 10/30/23 2122, 2 g at 10/30/23 2122   dextromethorphan -guaiFENesin  (MUCINEX  DM) 30-600 MG per 12 hr tablet 1 tablet, 1  tablet, Oral, BID, James Engels A, DO, 1 tablet at 10/31/23 8413   doxycycline  (VIBRAMYCIN ) 100 mg in sodium chloride  0.9 % 250 mL IVPB, 100 mg, Intravenous, Q12H, James Apa, DO, Last Rate: 125 mL/hr at 10/30/23 2342, 100 mg at 10/30/23 2342   enoxaparin  (LOVENOX ) injection 40 mg, 40 mg, Subcutaneous, Q24H, Sundil, Subrina, MD, 40 mg at 10/30/23 2336   folic acid  (FOLVITE ) tablet 1 mg, 1 mg, Oral, Daily, [name], Ping T, MD, 1 mg at 10/31/23 2440   hydrALAZINE  (APRESOLINE ) injection 5 mg, 5 mg, Intravenous, Q6H PRN, James Batty T, MD   HYDROmorphone  (DILAUDID ) injection 0.5-1 mg, 0.5-1 mg, Intravenous, Q2H PRN, James Batty T, MD   insulin  aspart (novoLOG ) injection 0-15 Units, 0-15 Units, Subcutaneous, TID WC, James Batty T, MD, 3 Units at 10/30/23 1649   levETIRAcetam  (KEPPRA ) tablet 1,500 mg, 1,500 mg, Oral, BID, James Leber, MD, 1,500 mg at 10/31/23 1027   LORazepam  (ATIVAN ) tablet 1-4 mg, 1-4 mg, Oral, Q1H PRN **OR** LORazepam  (ATIVAN ) injection 1-4 mg, 1-4 mg, Intravenous, Q1H PRN, James Batty T, MD, 2 mg at 10/30/23 1144   metroNIDAZOLE  (FLAGYL ) IVPB 500 mg, 500 mg, Intravenous, Q12H, James Engels A, DO, Last Rate: 100 mL/hr at 10/30/23 2330, 500 mg at 10/30/23 2330   multivitamin with minerals tablet 1 tablet, 1 tablet, Oral, Daily, Zhang, Ping T, MD, 1  tablet at 10/31/23 1610   ondansetron  (ZOFRAN ) tablet 4 mg, 4 mg, Oral, Q6H PRN **OR** ondansetron  (ZOFRAN ) injection 4 mg, 4 mg, Intravenous, Q6H PRN, [place], MD   oxyCODONE  (Oxy IR/ROXICODONE ) immediate release tablet 5 mg, 5 mg, Oral, Q6H PRN, Zhang, Ping T, MD   pantoprazole  (PROTONIX ) EC tablet 40 mg, 40 mg, Oral, QPM, Djan, Esther Hem, MD   potassium chloride  SA (KLOR-CON  M) CR tablet 40 mEq, 40 mEq, Oral, Q4H, Djan, Esther Hem, MD   senna-docusate (Senokot-S) tablet 1 tablet, 1 tablet, Oral, QHS PRN, Zhang, Ping T, MD   sertraline  (ZOLOFT ) tablet 100 mg, 100 mg, Oral, Daily, James Batty T, MD, 100 mg at 10/31/23  9604   thiamine  (VITAMIN B1) tablet 100 mg, 100 mg, Oral, Daily, 100 mg at 10/31/23 5409 **OR** thiamine  (VITAMIN B1) injection 100 mg, 100 mg, Intravenous, Daily, James Batty T, MD, 100 mg at 10/31/23 8119  Labs and Diagnostic Imaging   A1c 5.7  Imaging(Personally reviewed): MRI brain-cerebellar infarct  On revisiting his initial head CT, I do think that the infarct was present on arrival, it is located in Moss place that is prone to streak artifact, and therefore was very difficult prior to knowing it was there, but on revisiting it I do think it was definitely present on arrival.  Assessment   [name] is Moss 63 y.o. male with acute cerebellar infarct, as well as seizures.  He has Moss history of TBI as Moss child(age 62) with subsequent seizures during childhood, but none for many decades.  He was managed on phenobarbital at that time.  He does have Moss history of alcohol abuse, and had recently started drinking again, but doubt that alcohol withdrawal played Moss significant role in his seizure.  EEG on 4/22 recorded Moss subtle clinical seizure(confusion as only clinical manifestation), and he was started on Keppra .  I am concerned that he may be having ongoing intermittent subclinical seizures.  Without significant improvement overnight, I think that the next step would be transfer for long-term continuous EEG recording.  His stroke is fairly large and peripheral, and therefore embolic etiology is certainly possible and he will need further workup for this.  Echo is pending.  His lipid panel is exceedingly low, and I would favor repeating this to ensure this is accurate.  Recommendations  Continue Keppra  1.5 g twice daily Transfer for LTM EEG  Echo pending, continue telemetry Repeat Lipid panel, high intensity statin if LDL > 70 Pt,ot,st  ______________________________________________________________________   Signed, James Keto, MD [facility]

## 2023-10-31 NOTE — Care Management Important Message (Signed)
 Important Message  Patient Details  Name: [name] MRN: [id number] Date of Birth: 05/09/1951   Important Message Given:  Yes - Medicare IM     Anise Kerns 10/31/2023, 11:25 AM

## 2023-10-31 NOTE — H&P (Addendum)
 History and Physical   [name] ZOX:[id number] DOB: 05/12/1951 DOA: 10/31/2023  PCP: Lamon Pillow, MD   Patient coming from: [facility]  Chief Complaint: Altered status, stroke, seizure  HPI and course: [name] is a 63 y.o. male with medical history significant of hypertension, GERD, diabetes, anemia, alcoholic cirrhosis, depression, obesity, chronic pain presented to [facility] on 4/21 with altered mental status.  Patient had restarted alcohol use which she had abstained from.  Had been drinking for 4 to 6 weeks.  Was found unresponsive by spouse in the tub.  EMS was called and transported to the ED noted some seizure activity and route.  In the ED patient noted be febrile with leukocytosis to 19.5 and evidence of pneumonia/aspiration on CT.  Patient started on ceftriaxone , Flagyl , doxycycline  to cover for aspiration pneumonia.  EEG did show some seizure-like activity.  Initial CT head did not show stroke but repeat CT and MRI later did show sub acute infarct.  Neurology consulted and have been following.  Following persistent altered mental status and concern for subclinical seizures recommendation was made to transfer patient to [facility] long-[facility].  Some mild confusion, pleasant.   Review of Systems: As per HPI otherwise all other systems reviewed and are negative.  Past Medical History:  Diagnosis Date   Alcohol abuse    Seizures (HCC)     No past surgical history on file.  Social History  reports that he has quit smoking. His smoking use included cigarettes. He has never used smokeless tobacco. He reports current alcohol use. He reports that he does not use drugs.  Allergies  Allergen Reactions   Hydrochlorothiazide Other (See Comments)    Hyponatremia     No family history on file.  Prior to Admission medications   Medication Sig Start Date End Date Taking? Authorizing Provider  acetaminophen  (TYLENOL ) 500 MG tablet Take 1,000 mg by mouth every 6 (six) hours as  needed for mild pain (pain score 1-3).    [provider]  allopurinol  (ZYLOPRIM ) 100 MG tablet Take 100 mg by mouth daily. 08/28/23   [provider]  clobetasol (TEMOVATE) 0.05 % external solution Apply 1 Application topically 2 (two) times daily as needed. 08/09/23   [provider]  folic acid  (FOLVITE ) 1 MG tablet Take 1 tablet (1 mg total) by mouth daily. 10/31/23   Ezzard Holms, MD  insulin  aspart (NOVOLOG ) 100 UNIT/ML injection Inject 5-20 Units into the skin 3 (three) times daily before meals. SLIDING SCALE    [provider]  levETIRAcetam  (KEPPRA ) 750 MG tablet Take 2 tablets (1,500 mg total) by mouth 2 (two) times daily. 10/30/23   Ezzard Holms, MD  magnesium gluconate (MAGONATE) 500 (27 Mg) MG TABS tablet Take 500 mg by mouth daily.    [provider]  Multiple Vitamins-Minerals (MULTIVITAMIN WITH MINERALS) tablet Take 1 tablet by mouth daily.    [provider]  mupirocin ointment (BACTROBAN) 2 % Apply 1 Application topically daily. 06/26/23   [provider]  niacin (VITAMIN B3) 500 MG tablet Take 500 mg by mouth at bedtime.    [provider]  oxyCODONE  (OXY IR/ROXICODONE ) 5 MG immediate release tablet Take 1 tablet (5 mg total) by mouth every 6 (six) hours as needed for moderate pain (pain score 4-6) or severe pain (pain score 7-10). 10/30/23   Ezzard Holms, MD  sertraline  (ZOLOFT ) 100 MG tablet Take 100 mg by mouth daily. 08/15/23   [provider]  thiamine  (VITAMIN B-1) 100 MG tablet Take 1 tablet (100 mg total) by mouth daily. 10/31/23   Ezzard Holms, MD  traMADol  (ULTRAM ) 50 MG tablet Take 50 mg by mouth every 8 (eight) hours as needed for moderate pain (pain score 4-6).    [provider]  TRESIBA  FLEXTOUCH 100 UNIT/ML FlexTouch Pen Inject 20 Units into the skin at bedtime. 10/02/23   [provider]  zinc sulfate, 50mg  elemental zinc, 220 (50 Zn) MG capsule Take 220 mg by mouth  daily.    [provider]    Physical Exam: Vitals:   10/31/23 1638  BP: 115/83  Resp: 18  Temp: (!) 101.2 F (38.4 C)    Physical Exam Constitutional:      General: He is not in acute distress.    Appearance: He is ill-appearing.  HENT:     Head: Normocephalic and atraumatic.     Mouth/Throat:     Mouth: Mucous membranes are moist.     Pharynx: Oropharynx is clear.  Eyes:     Extraocular Movements: Extraocular movements intact.     Pupils: Pupils are equal, round, and reactive to light.  Cardiovascular:     Rate and Rhythm: Normal rate and regular rhythm.     Pulses: Normal pulses.     Heart sounds: Normal heart sounds.  Pulmonary:     Effort: Pulmonary effort is normal. No respiratory distress.     Breath sounds: Rales (Minimal) present.  Abdominal:     General: Bowel sounds are normal. There is no distension.     Palpations: Abdomen is soft.     Tenderness: There is no abdominal tenderness.  Musculoskeletal:        General: No swelling or deformity.  Skin:    General: Skin is warm and dry.  Neurological:     General: No focal deficit present.     Comments: Some mild confusion, pleasant. Alert.    Labs on Admission: I have personally reviewed following labs and imaging studies  CBC: Recent Labs  Lab 10/28/23 1114 10/29/23 0518 10/30/23 0018 10/30/23 0509 10/31/23 0338  WBC 19.5* 13.1* 12.3* 10.8* 8.0  NEUTROABS 17.3*  --   --  9.1* 6.1  HGB 16.1 14.2 13.5 13.1 12.5*  HCT 43.8 39.2 37.5* 36.1* 34.5*  MCV 92.2 94.7 94.5 93.3 94.8  PLT 141* 107* 93* 84* 83*    Basic Metabolic Panel: Recent Labs  Lab 10/28/23 1114 10/29/23 0518 10/30/23 0509 10/31/23 0338  NA 137 136 129* 136  K 3.1* 4.0 3.2* 3.1*  CL 98 100 101 106  CO2 25 21* 22 20*  GLUCOSE 87 140* 172* 116*  BUN 25* 23 15 14   CREATININE 0.97 0.61 0.57* 0.48*  CALCIUM 8.1* 7.8* 7.9* 8.0*  MG  --   --  1.8  --   PHOS  --   --  1.2*  --     GFR: Estimated Creatinine Clearance:  73.9 mL/min (A) (by C-G formula based on SCr of 0.48 mg/dL (L)).  Liver Function Tests: Recent Labs  Lab 10/28/23 1114 10/29/23 0518 10/30/23 0509  AST 132* 181* 139*  ALT 47* 63* 79*  ALKPHOS 178* 130* 122  BILITOT 3.7* 3.0* 3.2*  PROT 6.3* 5.6* 5.4*  ALBUMIN  3.2* 2.7* 2.7*    Urine analysis:    Component Value Date/Time   COLORURINE AMBER (A) 10/28/2023 1114   APPEARANCEUR CLEAR (A) 10/28/2023 1114   LABSPEC 1.023 10/28/2023 1114   PHURINE  5.0 10/28/2023 1114   GLUCOSEU NEGATIVE 10/28/2023 1114   HGBUR NEGATIVE 10/28/2023 1114   BILIRUBINUR NEGATIVE 10/28/2023 1114   KETONESUR 20 (A) 10/28/2023 1114   PROTEINUR 100 (A) 10/28/2023 1114   NITRITE NEGATIVE 10/28/2023 1114   LEUKOCYTESUR NEGATIVE 10/28/2023 1114    Radiological Exams on Admission: MR BRAIN WO CONTRAST Addendum Date: 10/30/2023 ADDENDUM REPORT: 10/30/2023 14:06 ADDENDUM: Subsequently obtained CTA of the head and neck shows that the area of abnormal diffusion weighted imaging in the right cerebellar hemisphere is more consistent with a subacute infarction. There is no hemorrhage. Electronically Signed   By: Juanetta Nordmann M.D.   On: 10/30/2023 14:06   Result Date: 10/30/2023 CLINICAL DATA:  Seizure disorder EXAM: MRI HEAD WITHOUT CONTRAST TECHNIQUE: Multiplanar, multiecho pulse sequences of the brain and surrounding structures were obtained without intravenous contrast. COMPARISON:  10/28/2023 FINDINGS: Brain: Intraparenchymal hemorrhage within the right cerebellum measuring 3.2 x 1.7 cm, new since the earlier head CT. Normal white matter signal, parenchymal volume and CSF spaces. The midline structures are normal. Vascular: Normal flow voids. Skull and upper cervical spine: Normal calvarium and skull base. Visualized upper cervical spine and soft tissues are normal. Sinuses/Orbits:No paranasal sinus fluid levels or advanced mucosal thickening. No mastoid or middle ear effusion. Normal orbits. IMPRESSION:  Intraparenchymal hemorrhage within the right cerebellum measuring 3.2 x 1.7 cm, new since the earlier head CT. Critical Value/emergent results were called by telephone at the time of interpretation on 10/29/2023 at 11:22 pm to provider [facility], who verbally acknowledged these results. Electronically Signed: By: Juanetta Nordmann M.D. On: 10/29/2023 23:22   CT ANGIO HEAD NECK W WO CM Result Date: 10/30/2023 CLINICAL DATA:  Acute neurologic deficit EXAM: CT ANGIOGRAPHY HEAD AND NECK WITH AND WITHOUT CONTRAST TECHNIQUE: Multidetector CT imaging of the head and neck was performed using the standard protocol during bolus administration of intravenous contrast. Multiplanar CT image reconstructions and MIPs were obtained to evaluate the vascular anatomy. Carotid stenosis measurements (when applicable) are obtained utilizing NASCET criteria, using the distal internal carotid diameter as the denominator. RADIATION DOSE REDUCTION: This exam was performed according to the departmental dose-optimization program which includes automated exposure control, adjustment of the mA and/or kV according to patient size and/or use of iterative reconstruction technique. CONTRAST:  75mL OMNIPAQUE  IOHEXOL  350 MG/ML SOLN COMPARISON:  Brain MRI 10/29/2023 FINDINGS: CT HEAD FINDINGS Brain: There is actually no hemorrhage within the right cerebellar hemisphere. There is focal hypoattenuation consistent with a subacute infarct. No midline shift or other mass effect. Mild generalized volume loss Vascular: No hyperdense vessel or unexpected vascular calcification. Skull: The visualized skull base, calvarium and extracranial soft tissues are normal. Sinuses/Orbits: No fluid levels or advanced mucosal thickening of the visualized paranasal sinuses. No mastoid or middle ear effusion. Normal orbits. CTA NECK FINDINGS Skeleton: No acute abnormality or high grade bony spinal canal stenosis. Other neck: Normal pharynx, larynx and major salivary  glands. No cervical lymphadenopathy. Unremarkable thyroid  gland. Upper chest: Biapical opacities, right greater than left. Aortic arch: There is no calcific atherosclerosis of the aortic arch. Conventional 3 vessel aortic branching pattern. RIGHT carotid system: Normal without aneurysm, dissection or stenosis. LEFT carotid system: Normal without aneurysm, dissection or stenosis. Vertebral arteries: Codominant configuration. There is no dissection, occlusion or flow-limiting stenosis to the skull base (V1-V3 segments). CTA HEAD FINDINGS POSTERIOR CIRCULATION: Vertebral arteries are normal. No proximal occlusion of the anterior or inferior cerebellar arteries. Basilar artery is normal. Superior cerebellar arteries are normal. Posterior cerebral  arteries are normal. ANTERIOR CIRCULATION: Atherosclerotic calcification of the internal carotid arteries at the skull base without hemodynamically significant stenosis. Anterior cerebral arteries are normal. Middle cerebral arteries are normal. Venous sinuses: As permitted by contrast timing, patent. Anatomic variants: None Review of the MIP images confirms the above findings. IMPRESSION: 1. No emergent large vessel occlusion or high-grade stenosis of the intracranial arteries. 2. 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 3. Biapical opacities, right greater than left, which could indicate infection or pulmonary edema. Electronically Signed   By: Juanetta Nordmann M.D.   On: 10/30/2023 02:48   EKG: Independently reviewed.  Last performed 4/21 and showed sinus tachycardia 102 bpm.  Significant baseline artifact and wander.  Nonspecific T wave changes.  Right bundle branch block.  Assessment/Plan Principal Problem:   Acute encephalopathy Active Problems:   Aspiration pneumonia (HCC)   Alcoholic cirrhosis of liver with ascites (HCC)   Acute metabolic encephalopathy   HTN (hypertension)   GERD (gastroesophageal reflux disease)   DM (diabetes mellitus) (HCC)    Anemia   Depression   Obese   Alcohol use   Acute encephalopathy Seizure > Noted to have seizure-like activity by EMS and route. > Continued altered mental status and concern for subclinical seizures.   > Possibly secondary to withdrawal given he has restarted drinking for the past 4 to 6 weeks.   > Positive on spot EEG and neurology recommended transfer to [facility] for continuous EEG. - Continue to monitor on telemetry - Neurology notified of patient arrival - EEG per neurology - Continue with Keppra  - Seizure precautions  CVA > Initial CT head showed no acute normality but repeat MR brain on 4/22 and subsequent CTA head neck showed subacute infarct. CTA showed no hemodynamically significant stenosis nor occlusion.  > (Note initial MRI read was corrected after initially stating evidence of bleed, which CT showed there was not, but instead was subacute infaect as above) > Lipid panel showed only low HDL.  A1c 5.7. - Neurology has been consulted/notified of arrival as above - Echocardiogram - PT/OT/SLP eval and treat  Aspiration pneumonia > Initial concern for sepsis with elevated lactic acid which has since cleared.  And altered mental status which is likely secondary to alternative etiology as above. > Initial blood cultures were positive 1 out of 2 bottles for Staph epidermidis.  Repeat blood cultures no growth to date for 2 days. - Continue with ceftriaxone , doxycycline , Flagyl  for now - Trend fever curve and WBC ADDENDUM > Patient spiked fever to one 101.7. - Repeat blood cultures - Broaden antibiotics to cefepime , vancomycin , Flagyl  - Check procalcitonin - Full viral panel - Change level of care to Progressive care  Alcoholic cirrhosis > Known history of this, follows with gastroenterology.  Previously has been on Lasix and spironolactone but appears he may have been transition to eplerenone.  Also on carvedilol on alternative chart. > Has not been receiving diuresis at  [facility].  Will need to confirm regimen. - Confirm regimen and resume diuresis as appropriate - Restart home carvedilol if confirmed  Hypertension - Will need to further clarify home regimen as he is marked for merge with an alternative chart - As needed hydralazine  for now  Diabetes - SSI  GERD - Continue PPI  Anemia > Hemoglobin stable - Trend CBC  Depression - Continue home sertraline   Chronic pain - Continue oxycodone , also was prescribed Dilaudid  for severe pain  DVT prophylaxis: Lovenox  Code Status:   Full Family Communication:  None  on admission  Disposition Plan:   Patient is from:  [facility]  Anticipated DC to:  Pending clinical course  Anticipated DC date:  2 to 5 days  Anticipated DC barriers: None  Consults called:  Neurology Admission status:  Inpatient, telemetry  Severity of Illness: The appropriate patient status for this patient is INPATIENT. Inpatient status is judged to be reasonable and necessary in order to provide the required intensity of service to ensure the patient's safety. The patient's presenting symptoms, physical exam findings, and initial radiographic and laboratory data in the context of their chronic comorbidities is felt to place them at high risk for further clinical deterioration. Furthermore, it is not anticipated that the patient will be medically stable for discharge from the hospital within 2 midnights of admission.   * I certify that at the point of admission it is my clinical judgment that the patient will require inpatient hospital care spanning beyond 2 midnights from the point of admission due to high intensity of service, high risk for further deterioration and high frequency of surveillance required.Johnetta Nab MD [facility]  How to contact the [facility] Attending or Consulting provider 7A - 7P or covering provider during after hours 7P -7A, for this patient?   Check the care team in [facility] and look for a)  attending/consulting [facility] provider listed and b) the [facility] team listed Log into [email or web address] and use [facility] universal password to access. If you do not have the password, please contact the hospital operator. Locate the [facility] provider you are looking for under [facility] and page to a number that you can be directly reached. If you still have difficulty reaching the provider, please page the [facility] (Director on Call) for the Hospitalists listed on amion for assistance.  10/31/2023, 5:36 PM

## 2023-10-31 NOTE — Progress Notes (Signed)
 Plan of care is reviewed. Pt is stable hemodynamically, NSR on the monitor, on room air with normal respiratory effort. He has low grade of fever, Temp 102.5 F at 03:58.pm. NIHSS per documented below. Pt is drowsy, able to follow commands, cooperative, oriented to self and place. Awaiting to transfer to [facility]. We will continue to monitor.   10/31/23 0341  Neurological  Neuro (WDL) X  Orientation Level Oriented to person;Oriented to place;Oriented to situation;Disoriented to time  Cognition Follows commands;Memory impairment  Speech Clear  R Pupil Size (mm) 3  R Pupil Shape Round  R Pupil Reaction Brisk  L Pupil Size (mm) 3  L Pupil Shape Round  L Pupil Reaction Brisk  Motor Function/Sensation Assessment Head;Grip;Elbow extension;Elbow flexion;Pronator drift;Dorsiflexion;Plantar flexion;Arm ataxia;Leg ataxia;Motor response;Sensation;Motor strength  Facial Symmetry Symmetrical  R Hand Grip Strong  L Hand Grip Strong  R Elbow Extension (Push/Biceps) Strong  L Elbow Extension (Push/Biceps) Strong  R Elbow Flexion (Pull/Triceps) Strong  L Elbow Flexion (Pull/Triceps) Strong  Right Pronator Drift Absent  Left Pronator Drift Absent  R Foot Dorsiflexion Moderate  L Foot Dorsiflexion Moderate  R Foot Plantar Flexion Moderate  L Foot Plantar Flexion Moderate  R Finger to Nose (Point to [employer]) Smooth  L Finger to Nose (Point to [employer]) Smooth  R Heel to [employer] (Point to [employer]) Smooth  L Heel to [employer] to [employer]) Smooth (pain from injury)  RUE Motor Response Purposeful movement  RUE Sensation Full sensation  RUE Motor Strength 5  LUE Motor Response Purposeful movement  LUE Sensation Full sensation  LUE Motor Strength 5  RLE Motor Response Purposeful movement  RLE Sensation Full sensation  RLE Motor Strength 5  LLE Motor Response Purposeful movement  LLE Sensation Full sensation  LLE Motor Strength 4 (injuried)  Neuro Symptoms Forgetful;Fatigue;Drowsiness  Neuro symptoms  relieved by Rest;Relaxation techniques (Comment)  Neuro Additional Assessments NIH stroke scale;Glasgow Coma Scale  Glasgow Coma Scale  Eye Opening 4  Best Verbal Response (NON-intubated) 4  Best Motor Response 6  Glasgow Coma Scale Score 14  NIH Stroke Scale   Dizziness Present No  Headache Present No  Interval Other (Comment)  Level of Consciousness (1a.)    0  LOC Questions (1b. )    0  LOC Commands (1c. )    0  Best Gaze (2. )   0  Visual (3. )   0  Facial Palsy (4. )     0  Motor Arm, Left (5a. )    0  Motor Arm, Right (5b. )  0  Motor Leg, Left (6a. )   1 (left leg injuried)  Motor Leg, Right (6b. )  0  Limb Ataxia (7. ) 0  Sensory (8. )   0  Best Language (9. )   0  Dysarthria (10. ) 0  Extinction/Inattention (11.)    0  Complete NIHSS TOTAL 1    Brain Cahill, RN

## 2023-10-31 NOTE — Progress Notes (Signed)
 MEWS Progress Note  Patient Details Name: Suhaib Guzzo MRN: [id number] DOB: 08/23/1950 Today's Date: 10/31/2023   MEWS Flowsheet Documentation:  Assess: MEWS Score Temp: (!) 101.7 F (38.7 C) BP: 115/83 MAP (mmHg): 94 Pulse Rate: (!) 101 ECG Heart Rate: (!) 102 Resp: (!) 37 O2 Device: Nasal Cannula O2 Flow Rate (L/min): 2 L/min Assess: MEWS Score MEWS Temp: 2 MEWS Systolic: 0 MEWS Pulse: 1 MEWS RR: 3 MEWS LOC: 0 MEWS Score: 6 MEWS Score Color: Red Assess: SIRS CRITERIA SIRS Temperature : 1 SIRS Respirations : 1 SIRS Pulse: 1 SIRS WBC: 0 SIRS Score Sum : 3 SIRS Temperature : 1 SIRS Pulse: 1 SIRS Respirations : 1 SIRS WBC: 0 SIRS Score Sum : 3 Assess: if the MEWS score is Yellow or Red Were vital signs accurate and taken at a resting state?: Yes Does the patient meet 2 or more of the SIRS criteria?: Yes Does the patient have a confirmed or suspected source of infection?: Yes Provider Notification Provider Name/Title: Sandy Crumb Date Provider Notified: 10/31/23 Time Provider Notified: 1752 Method of Notification: Page Notification Reason: Other (Comment) (Notified provider patient has Low O2 sat and Fever. Pt now on 2L [place] and getting tylenol .) Provider response: See new orders Date of Provider Response: 10/31/23 Time of Provider Response: 1752      Bartley Borrow [facility] 10/31/2023, 6:02 PM

## 2023-10-31 NOTE — Progress Notes (Signed)
 LTM EEG hooked up and running - no initial skin breakdown - push button tested - [facility] monitoring.

## 2023-10-31 NOTE — Progress Notes (Signed)
 Progress Note   Patient: [name] ZOX:[id number] DOB: 06/15/1951 DOA: 10/28/2023     3 DOS: the patient was seen and examined on 10/31/2023   Brief hospital course:   [name] is a 63 y.o. male with medical history significant of alcohol abuse, chronic pain syndrome on chronic narcotic therapy, IDDM, HTN, brought in by family member for evaluation of altered mentations. Patient is still confused unable to provide any history, and so all history provided by wife at bedside as well as from chart review.  Patient has been drinking, earlier this year he was seen in the alcohol rehab and remained sober until about 6 weeks ago.  And due to stressful life event patient started to pick up drinking again about 4 weeks ago, last drink was the day before presentation.  Patient was found by the wife in bathtub snoring heavily and not responding.  Wife herself has had severe knee problems and only able to help him to get out of bathtub.  Wife denied seeing any suspicious seizure-like movement.  EMS was subsequently called.  EMS reported that the patient had a short course of seizure activity 15-20 seconds, did not need any Ativan  and recovered consciousness symptoms of no tongue biting or loss control bowel movement or urination. ED Course: Febrile temperature 102.6 tachycardia borderline hypotensive not hypoxic.  Chest x-ray and CT chest abdomen pelvis showed bilateral lower field infiltrates compatible with aspiration pneumonia.  Liver cirrhosis with focal ascites around the liver, sodium 137 potassium 3.1 BUN 25 creatinine 0.9 lactic acid 3.80 ammonia 26, CK20 355, AST 132 ALT 47 bilirubin 3.7.  No CBD dilatation, no signs to suspect acute cholecystitis.  WBC 19.5, hemoglobin 6.1.  Hospital course.  Patient was subsequently admitted for severe sepsis due to multifocal pneumonia, possibly aspiration, complicated by acute metabolic encephalopathy, lactic acidosis and is being monitored closely for alcohol  withdrawal given recent relapse of drinking for the past several weeks.  Patient underwent EEG on 10/29/2023 and was found to have had some concerns of subclinical seizure and patient was subsequently initiated on Keppra  by neurologist.  Since patient's encephalopathy have not improved, neurologist is concerned that patient is having intermittent subclinical seizures and the next step is to obtain long-term continuous EEG in order to determine if patient would need more aggressive antiepileptic drugs.  Neurologist have therefore recommended transfer for continuous EEG.  MRI of the brain initially was reported as having intraparenchymal hemorrhage within the right cerebellum however comparing it with the CTA showed that this area of abnormal diffusion weighted imaging in the right cerebellar hemisphere is more consistent with a subacute infarct and there is no hemorrhage.  Radiologist have subsequently addended the MRI report.  Neurologist on-call Dr. Greg Leaks is aware of patient and needs to be consulted upon arrival.   Assessment and Plan:   Severe sepsis due to multifocal pneumonia, possible aspiration Positive blood culture - 1/4 bottles +Staph epi, suspect contaminant Sepsis with acute endorgan damage of acute encephalopathy and lactic acidosis, as evidenced by fever, leukocytosis, tachycardia --Continue Rocephin , Flagyl  Continue doxycycline  for atypical coverage --Monitor O2 sats and supplement O2 if < 90% on room air --Continue aspiration precautions     Acute metabolic encephalopathy Seizure, clinically suspect alcohol withdrawal seizure by history provided on admission --Seizure precautions --CIWA protocol with as needed benzos Continue delirium precaution --EEG showed subclinical seizure --Neurology is consulted and Keppra  initiated Neurologist as recommended the patient be transferred to [facility] for continuous EEG Dr. Marlin Simmonds  Doretta Gant the neurologist on-call at [facility] aware of  patient and should be consulted on arrival.   Acute, probably on chronic alcoholic hepatitis - Calculated discriminant function= 12.4, no indication for steroid - CT abdominal pelvis showed no significant other acute hepatobiliary findings. - Manage alcohol withdrawal Continue to monitor LFT   Chronic alcoholic cirrhosis Ammonia level within normal limits on admission. Continue to monitor closely   HTN - BP borderline low on admission. --BP meds on hold, resume when indicated   IDDM --Holding Lantus --Sliding scale Novolog  for now   Chronic narcotic dependence Currently there is no symptoms or signs of narcotic withdrawal --Monitor   Hypokalemia -continue replacement and monitoring Potassium today 3.1   Mild rhabdomyolysis CK level improving   DVT prophylaxis-continue Lovenox   Consultants: Neurology   Subjective:  Patient seen and examined at bedside this morning He is able to tell me he is in the hospital and knows the name of the president Unable to tell me the date or year. Denies headache nausea or vomiting abdominal pain Still pending bed availability and transfer for continuous EEG  Physical Exam:  General exam: Elderly male laying in bed confused HEENT: moist mucus membranes, hearing grossly normal  Respiratory system: CTAB diminished bases Cardiovascular system: normal S1/S2, RRR Gastrointestinal system: soft, NT, ND, no HSM felt, +bowel sounds. Central nervous system: Alert and oriented to self, moving all extremities Extremities: moves all , no edema, normal tone Skin: dry, intact, normal temperature, wound below knee is scabbed     Vitals:   10/31/23 0922 10/31/23 1130 10/31/23 1406 10/31/23 1433  BP: 123/75 111/66 111/66 114/67  Pulse: 96 88 88 91  Resp: 18 18 18 18   Temp: 99.2 F (37.3 C) 98.7 F (37.1 C) 98.7 F (37.1 C) 98.7 F (37.1 C)  TempSrc:    Oral  SpO2: 94% 98% 98% 93%  Weight:      Height:        Data Reviewed:     Latest  Ref Rng & Units 10/31/2023    3:38 AM 10/30/2023    5:09 AM 10/30/2023   12:18 AM  CBC  WBC 4.0 - 10.5 K/uL 8.0  10.8  12.3   Hemoglobin 13.0 - 17.0 g/dL 84.1  32.4  40.1   Hematocrit 39.0 - 52.0 % 34.5  36.1  37.5   Platelets 150 - 400 K/uL 83  84  93        Latest Ref Rng & Units 10/31/2023    3:38 AM 10/30/2023    5:09 AM 10/29/2023    5:18 AM  BMP  Glucose 70 - 99 mg/dL 027  253  664   BUN 8 - 23 mg/dL 14  15  23    Creatinine 0.61 - 1.24 mg/dL 4.03  4.74  2.59   Sodium 135 - 145 mmol/L 136  129  136   Potassium 3.5 - 5.1 mmol/L 3.1  3.2  4.0   Chloride 98 - 111 mmol/L 106  101  100   CO2 22 - 32 mmol/L 20  22  21    Calcium 8.9 - 10.3 mg/dL 8.0  7.9  7.8       Disposition: Status is: Inpatient   Planned Discharge Destination: Being transferred to [facility]  Time spent: 46 minutes  Author: Ezzard Holms, MD 10/31/2023 2:33 PM  For on call review [email or web address].

## 2023-10-31 NOTE — Evaluation (Signed)
 Speech Language Pathology Evaluation Patient Details Name: [name] MRN: [id number] DOB: 1950-10-14 Today's Date: 10/31/2023 Time: 0900-0930 SLP Time Calculation (min) (ACUTE ONLY): 30 min  Problem List:  Patient Active Problem List   Diagnosis Date Noted   Acute metabolic encephalopathy 10/29/2023   Severe sepsis (HCC) 10/28/2023   Aspiration pneumonia (HCC) 10/28/2023   Alcoholic cirrhosis of liver with ascites (HCC) 10/28/2023   Past Medical History:  Past Medical History:  Diagnosis Date   Alcohol abuse    Seizures (HCC)    Past Surgical History: History reviewed. No pertinent surgical history. HPI:  Perneurology note, "Thurlow Gallaga is a 63 y.o. male with acute cerebellar infarct, as well as seizures.  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  He was managed on phenobarbital at that time.  He does have a history of alcohol abuse, and had recently started drinking again, but doubt that alcohol withdrawal played a significant role in his seizure.     EEG on 4/22 recorded a subtle clinical seizure(confusion as only clinical manifestation), and he was started on Keppra .     I am concerned that he may be having ongoing intermittent subclinical seizures.  Without significant improvement overnight, I think that the next step would be transfer for long-term continuous EEG recording.     His stroke is fairly large and peripheral, and therefore embolic etiology is certainly possible and he will need further workup for this." MRI 4/22: Subsequently obtained CTA of the head and neck shows that the area  of abnormal diffusion weighted imaging in the right cerebellar  hemisphere is more consistent with a subacute infarction. There is  no hemorrhage.   Assessment / Plan / Recommendation Clinical Impression  Pt seen for cognitive linguistic evaluation in the setting of subacute infarct per imaging/seizure. Assessment consisting of  pt interview and completion of [employer] mental Status (SLUMS). Score of 23/30 obtained on SLUMS falling outside the criterion of normal limits. Pt presents with grossly mild cognitive communication deficit, c/b impaired orientation, executive function, short term recall, and working memory. Verbal cues (binary options and semantic cues) and repetition bolstered cognitive processing. Independent orientation, expressive/receptive language, and visual processing demonstrated during assessment. Motor speech is grossly intact. Pt with emerging awareness for current deficits. No family present for verification of baseline/acute difference from baseline. Therefore, recommend continued assessment to determine approximation to baseline and follow up recommendations. Pt in agreement with stated plan.   Orientation  2/3  Functional Calculation  3/3  Generative Naming 2/3  Lexical Recall 4/5  Working Memory  1/2  Air cabin crew 2/4  Engineer, petroleum 2/2  Auditory Comprehension  8/8   Total 23/30- falling outside the criterion of functional limits (greater than 26)      SLP Assessment  SLP Recommendation/Assessment: Patient needs continued Speech Lanaguage Pathology Services SLP Visit Diagnosis: Cognitive communication deficit (R41.841)    Recommendations for follow up therapy are one component of a multi-disciplinary discharge planning process, led by the attending physician.  Recommendations may be updated based on patient status, additional functional criteria and insurance authorization.    Follow Up Recommendations  Follow physician's recommendations for discharge plan and follow up therapies    Assistance Recommended at Discharge  Intermittent Supervision/Assistance (for higher level cognitive tasks)  Functional Status Assessment Patient has had a recent decline in their functional status and demonstrates the ability to make significant improvements in function in a  reasonable  and predictable amount of time.  Frequency and Duration min 2x/week  2 weeks      SLP Evaluation Cognition  Overall Cognitive Status: No family/caregiver present to determine baseline cognitive functioning Arousal/Alertness: Awake/alert Orientation Level: Oriented to person;Oriented to place;Oriented to situation;Disoriented to time Attention: Focused;Sustained Focused Attention: Appears intact Sustained Attention: Appears intact Memory: Impaired Memory Impairment: Decreased short term memory (4/5 lexical recall) Decreased Short Term Memory: Verbal basic Awareness: Appears intact Problem Solving: Impaired Problem Solving Impairment: Verbal basic Executive Function: Self Correcting;Self Monitoring Self Monitoring: Impaired Self Monitoring Impairment: Verbal basic Self Correcting: Impaired Self Correcting Impairment: Verbal basic Behaviors:  (none) Safety/Judgment:  (needs further assessment)       Comprehension  Auditory Comprehension Overall Auditory Comprehension: Appears within functional limits for tasks assessed Visual Recognition/Discrimination Discrimination: Within Function Limits Reading Comprehension Reading Status: Not tested    Expression Expression Primary Mode of Expression: Verbal Verbal Expression Overall Verbal Expression: Appears within functional limits for tasks assessed Written Expression Dominant Hand: Right Written Expression: Within Functional Limits (functional for completion of clock drawing task)   Oral / Motor  Oral Motor/Sensory Function Overall Oral Motor/Sensory Function: Within functional limits Motor Speech Overall Motor Speech: Appears within functional limits for tasks assessed           [place] Raygen Dahm Clapp, MS, CCC-SLP Speech Language Pathologist Rehab Services; [facility] [phone] (ascom)   [place] J Clapp 10/31/2023, 12:28 PM

## 2023-10-31 NOTE — Progress Notes (Signed)
 Pharmacy Antibiotic Note  [name] is a 63 y.o. male admitted to [facility] on 10/28/23 and transferred to [facility] today.  Pharmacy has been consulted for Vancomycin  and Cefepime  dosing.  Broadening from Ceftriaxone  and Doxycycline  due to temp spike to 101.7; repeating blood cultures, and also resuming Metronidazole .   Plan: Cefepime  2gm IV q8h Vanc 1250 mg IV x 1, then Vancomycin  750 mg IV Q 12 hrs. Goal AUC 400-550. Expected AUC: 497 using SCr 0.8 (actual 0.48) Flagyl  500 IV q12h per MD Follow renal fxn, culture data, progress, abx plans   Height: 5\' 6"  (167.6 cm) Weight: 63.5 kg (139 lb 15.9 oz) IBW/kg (Calculated) : 63.8  Temp (24hrs), Avg:99.7 F (37.6 C), Min:98.7 F (37.1 C), Max:101.7 F (38.7 C)  Recent Labs  Lab 10/28/23 1114 10/28/23 1549 10/29/23 0518 10/29/23 1529 10/30/23 0018 10/30/23 0509 10/30/23 1618 10/30/23 1912 10/31/23 0338  WBC 19.5*  --  13.1*  --  12.3* 10.8*  --   --  8.0  CREATININE 0.97  --  0.61  --   --  0.57*  --   --  0.48*  LATICACIDVEN 3.8* 2.0*  --  2.4*  --  1.3 1.3 1.0  --     Estimated Creatinine Clearance: 73.9 mL/min (A) (by C-G formula based on SCr of 0.48 mg/dL (L)).    Allergies  Allergen Reactions   Hydrochlorothiazide Other (See Comments)    Hyponatremia     Antimicrobials this admission: Vancomycin  4/21 x1; resuming at [facility] 4/24 >> Cefepime  4/21 x1; resuming at [facility] 4/24 >> Metronidazole  IV 4/21 >>4/22; resumed 4/24 >> Ceftriaxone  4/21 >>4/24 (last dose 4/23 pm) Doxycycline  IV 4/22>>4/24 am  Dose adjustments this admission: n/a  Microbiology results: 4/21 blood: Staph epi, BCID MSSE 4/21 blood: no growth x 3 days to date 4/22 blood: no growth x 2 days to date 4/22 GI panel: negative 4/22 C diff: negative 4/21 COVID, flu and RSV: neg 4/24 blood: sent 4/24 resp panel: sent  Thank you for allowing pharmacy to be a part of this patient's care.  Adolphus Akin, [place] 10/31/2023 6:49 PM

## 2023-11-01 ENCOUNTER — Inpatient Hospital Stay (HOSPITAL_COMMUNITY)

## 2023-11-01 ENCOUNTER — Encounter (HOSPITAL_COMMUNITY)

## 2023-11-01 DIAGNOSIS — I6389 Other cerebral infarction: Secondary | ICD-10-CM | POA: Diagnosis not present

## 2023-11-01 DIAGNOSIS — I639 Cerebral infarction, unspecified: Secondary | ICD-10-CM | POA: Diagnosis not present

## 2023-11-01 DIAGNOSIS — W19XXXA Unspecified fall, initial encounter: Secondary | ICD-10-CM

## 2023-11-01 DIAGNOSIS — R569 Unspecified convulsions: Secondary | ICD-10-CM | POA: Diagnosis not present

## 2023-11-01 DIAGNOSIS — Z87820 Personal history of traumatic brain injury: Secondary | ICD-10-CM

## 2023-11-01 DIAGNOSIS — F1090 Alcohol use, unspecified, uncomplicated: Secondary | ICD-10-CM | POA: Diagnosis not present

## 2023-11-01 DIAGNOSIS — G9340 Encephalopathy, unspecified: Secondary | ICD-10-CM | POA: Diagnosis not present

## 2023-11-01 DIAGNOSIS — I10 Essential (primary) hypertension: Secondary | ICD-10-CM | POA: Diagnosis not present

## 2023-11-01 DIAGNOSIS — K219 Gastro-esophageal reflux disease without esophagitis: Secondary | ICD-10-CM | POA: Diagnosis not present

## 2023-11-01 LAB — GLUCOSE, CAPILLARY
Glucose-Capillary: 150 mg/dL — ABNORMAL HIGH (ref 70–99)
Glucose-Capillary: 221 mg/dL — ABNORMAL HIGH (ref 70–99)
Glucose-Capillary: 135 mg/dL — ABNORMAL HIGH (ref 70–99)
Glucose-Capillary: 184 mg/dL — ABNORMAL HIGH (ref 70–99)

## 2023-11-01 LAB — COMPREHENSIVE METABOLIC PANEL WITH GFR
ALT: 51 U/L — ABNORMAL HIGH (ref 0–44)
AST: 37 U/L (ref 15–41)
Albumin: 2.3 g/dL — ABNORMAL LOW (ref 3.5–5.0)
Alkaline Phosphatase: 99 U/L (ref 38–126)
Anion gap: 7 (ref 5–15)
BUN: 13 mg/dL (ref 8–23)
CO2: 20 mmol/L — ABNORMAL LOW (ref 22–32)
Calcium: 7.9 mg/dL — ABNORMAL LOW (ref 8.9–10.3)
Chloride: 106 mmol/L (ref 98–111)
Creatinine, Ser: 0.72 mg/dL (ref 0.61–1.24)
GFR, Estimated: >60 mL/min (ref >60)
Glucose, Bld: 166 mg/dL — ABNORMAL HIGH (ref 70–99)
Potassium: 4.2 mmol/L (ref 3.5–5.1)
Sodium: 133 mmol/L — ABNORMAL LOW (ref 135–145)
Total Bilirubin: 1.9 mg/dL — ABNORMAL HIGH (ref 0.0–1.2)
Total Protein: 4.9 g/dL — ABNORMAL LOW (ref 6.5–8.1)

## 2023-11-01 LAB — GASTROINTESTINAL PANEL BY PCR, STOOL (REPLACES STOOL CULTURE)

## 2023-11-01 LAB — ECHOCARDIOGRAM COMPLETE
AR max vel: 1.79 cm2
AV Area VTI: 2.41 cm2
AV Area mean vel: 2.04 cm2
AV Mean grad: 13 mmHg
AV Peak grad: 26 mmHg
Ao pk vel: 2.55 m/s
Area-P 1/2: 2.4 cm2
Height: 66 in
MV VTI: 3.87 cm2
S' Lateral: 2.5 cm
Single Plane A4C EF: 74.5 %
Weight: 2239.87 [oz_av]

## 2023-11-01 LAB — CBC WITH DIFFERENTIAL/PLATELET
Abs Immature Granulocytes: 0.18 10*3/uL — ABNORMAL HIGH (ref 0.00–0.07)
Basophils Absolute: 0.1 10*3/uL (ref 0.0–0.1)
Basophils Relative: 1 %
Eosinophils Absolute: 0.2 10*3/uL (ref 0.0–0.5)
Eosinophils Relative: 2 %
HCT: 35.9 % — ABNORMAL LOW (ref 39.0–52.0)
Hemoglobin: 12.8 g/dL — ABNORMAL LOW (ref 13.0–17.0)
Immature Granulocytes: 2 %
Lymphocytes Relative: 6 %
Lymphs Abs: 0.7 10*3/uL (ref 0.7–4.0)
MCH: 34.1 pg — ABNORMAL HIGH (ref 26.0–34.0)
MCHC: 35.7 g/dL (ref 30.0–36.0)
MCV: 95.7 fL (ref 80.0–100.0)
Monocytes Absolute: 1.4 10*3/uL — ABNORMAL HIGH (ref 0.1–1.0)
Monocytes Relative: 13 %
Neutro Abs: 8.1 10*3/uL — ABNORMAL HIGH (ref 1.7–7.7)
Neutrophils Relative %: 76 %
Platelets: 91 10*3/uL — ABNORMAL LOW (ref 150–400)
RBC: 3.75 MIL/uL — ABNORMAL LOW (ref 4.22–5.81)
RDW: 14.4 % (ref 11.5–15.5)
WBC: 10.6 10*3/uL — ABNORMAL HIGH (ref 4.0–10.5)
nRBC: 0 % (ref 0.0–0.2)

## 2023-11-01 LAB — C DIFFICILE QUICK SCREEN W PCR REFLEX
C Diff antigen: NEGATIVE
C Diff interpretation: NOT DETECTED
C Diff toxin: NEGATIVE

## 2023-11-01 LAB — PROCALCITONIN: Procalcitonin: 0.63 ng/mL

## 2023-11-01 MED ORDER — THIAMINE HCL 100 MG/ML IJ SOLN: 500.0000 mg | Freq: Every day | INTRAMUSCULAR | Status: AC

## 2023-11-01 MED ORDER — THIAMINE MONONITRATE 100 MG PO TABS
500.0000 mg | ORAL_TABLET | Freq: Every day | ORAL | Status: AC
  Administered 2023-11-02 – 2023-11-05 (×4): 500 mg via ORAL
  Filled 2023-11-01 (×4): qty 5

## 2023-11-01 MED ORDER — ASPIRIN 81 MG PO CHEW
81.0000 mg | CHEWABLE_TABLET | Freq: Every day | ORAL | Status: DC
  Administered 2023-11-01 – 2023-11-05 (×5): 81 mg via ORAL
  Filled 2023-11-01 (×5): qty 1

## 2023-11-01 MED ORDER — PERFLUTREN LIPID MICROSPHERE
1.0000 mL | INTRAVENOUS | Status: AC | PRN
Start: 2023-11-01 — End: 2023-11-01
  Administered 2023-11-01: 2 mL via INTRAVENOUS

## 2023-11-01 NOTE — Progress Notes (Addendum)
 Pt is with [facility] and not available.

## 2023-11-01 NOTE — Progress Notes (Signed)
 Physical Therapy Treatment Patient Details Name: [name] MRN: [id number] DOB: 1951-04-02 Today's Date: 11/01/2023   History of Present Illness [name] is a 63 y.o. male presenting 4/24 with AMS.  Pt found to have severe sepsis, acute metabolic encephalopathy, acute on chronic alcoholic hepatitis, mild rhabdomyolysis, and hypokalemia.  Per neurologist note on 4/23 pt with cerebellar infarct. PMH: alcohol abuse, chronic pain syndrome on chronic narcotic therapy, IDDM, HTN    PT Comments  Transferred from [facility]; original PT evaluation 4/22. Follow-up treatment today, pt demonstrated improved mobility and cognition from prior notes. Able to mobilize to EOB with min assist. Stand with hand held support, min assist to boost and stabilize, step pivot transfer to recliner. Trunk flexed, difficulty fully extending Lt knee in standing, reports bil LE soreness from fall PTA. Pleasant and happy to be out of bed. Gait limited by EEG connections today. Alarm turned on in chair, EEG camera on pt. Visitors present. Patient will benefit from intensive inpatient follow-up therapy, >3 hours/day. Patient will continue to benefit from skilled physical therapy services to further improve independence with functional mobility.     If plan is discharge home, recommend the following: A little help with walking and/or transfers;A little help with bathing/dressing/bathroom;Assist for transportation;Help with stairs or ramp for entrance;Assistance with cooking/housework   Can travel by private vehicle     No  Equipment Recommendations  Other (comment) (TBD at next venue of care)    Recommendations for Other Services Rehab consult     Precautions / Restrictions Precautions Precautions: Fall Recall of Precautions/Restrictions: Impaired Precaution/Restrictions Comments: cont EEG, rectal pouch Restrictions Weight Bearing Restrictions Per Provider Order: No     Mobility  Bed Mobility Overal bed mobility: Needs  Assistance Bed Mobility: Supine to Sit     Supine to sit: Min assist     General bed mobility comments: Min assist to sequence LEs and raise trunk to EOB.    Transfers Overall transfer level: Needs assistance Equipment used: 1 person hand held assist Transfers: Sit to/from Stand Sit to Stand: Min assist   Step pivot transfers: Min assist       General transfer comment: Min assist for boost and balance to stance, VC for sequencine with step pivot transfer, min assist for balance, and hand held support.    Ambulation/Gait               General Gait Details: deferred while connected to EEG   Stairs             Wheelchair Mobility     Tilt Bed    Modified Rankin (Stroke Patients Only) Modified Rankin (Stroke Patients Only) Pre-Morbid Rankin Score: No significant disability Modified Rankin: Moderately severe disability     Balance Overall balance assessment: Needs assistance Sitting-balance support: Feet supported, No upper extremity supported Sitting balance-Leahy Scale: Fair     Standing balance support: Single extremity supported, During functional activity Standing balance-Leahy Scale: Poor                              Communication Communication Communication: No apparent difficulties  Cognition Arousal: Alert Behavior During Therapy: WFL for tasks assessed/performed   PT - Cognitive impairments: No family/caregiver present to determine baseline                         Following commands: Impaired Following commands impaired: Follows multi-step commands inconsistently, Follows multi-step  commands with increased time    Cueing Cueing Techniques: Verbal cues, Gestural cues  Exercises General Exercises - Lower Extremity Ankle Circles/Pumps: AROM, Both, 15 reps, Seated Quad Sets: Strengthening, Both, 10 reps, Seated Gluteal Sets: Strengthening, Both, 15 reps, Seated    General Comments General comments (skin  integrity, edema, etc.): SpO2 low 90s on RA when PT entered room. No changes with acitivity      Pertinent Vitals/Pain Pain Assessment Pain Assessment: Faces Faces Pain Scale: Hurts little more Pain Location: BLE Pain Descriptors / Indicators: Sore Pain Intervention(s): Monitored during session, Repositioned    Home Living Family/patient expects to be discharged to:: Private residence Living Arrangements: Spouse/significant other Available Help at Discharge: Family;Available 24 hours/day Type of Home: House Home Access: Stairs to enter Entrance Stairs-Rails: Right;Left;Can reach both Entrance Stairs-Number of Steps: 2   Home Layout: One level Home Equipment: Agricultural consultant (2 wheels);Cane - single point;Shower seat      Prior Function            PT Goals (current goals can now be found in the care plan section) Acute Rehab PT Goals Patient Stated Goal: To get back home PT Goal Formulation: With patient Time For Goal Achievement: 11/11/23 Potential to Achieve Goals: Good Progress towards PT goals: Progressing toward goals    Frequency    Min 2X/week      PT Plan      Co-evaluation              AM-PAC PT "6 Clicks" Mobility   Outcome Measure  Help needed turning from your back to your side while in a flat bed without using bedrails?: A Little Help needed moving from lying on your back to sitting on the side of a flat bed without using bedrails?: A Little Help needed moving to and from a bed to a chair (including a wheelchair)?: A Little Help needed standing up from a chair using your arms (e.g., wheelchair or bedside chair)?: A Little Help needed to walk in hospital room?: A Lot Help needed climbing 3-5 steps with a railing? 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

## 2023-11-01 NOTE — Progress Notes (Addendum)
 Subjective: [name].  Reports pain in bilateral lower extremity secondary to fall.  Denies any other concerns.   ROS: negative except above  Examination  Vital signs in last 24 hours: Temp:  [98.7 F (37.1 C)-101.7 F (38.7 C)] 99.2 F (37.3 C) (04/25 0822) Pulse Rate:  [80-101] 84 (04/25 1200) Resp:  [18-39] 31 (04/25 1200) BP: (105-117)/(63-83) 107/69 (04/25 1200) SpO2:  [90 %-98 %] 90 % (04/25 0822) Weight:  [63.5 kg] 63.5 kg (04/24 1640)  General: lying in bed Neuro: MS: Alert, oriented, follows commands CN: pupils equal and reactive,  EOMI, face symmetric, tongue midline, normal sensation over face, Motor: 5/5 strength in bilateral upper extremities, 3/5 in bilateral lower extremities Coordination: normal Gait: not tested  Basic Metabolic Panel: Recent Labs  Lab 10/28/23 1114 10/29/23 0518 10/30/23 0509 10/31/23 0338 11/01/23 0517  NA 137 136 129* 136 133*  K 3.1* 4.0 3.2* 3.1* 4.2  CL 98 100 101 106 106  CO2 25 21* 22 20* 20*  GLUCOSE 87 140* 172* 116* 166*  BUN 25* 23 15 14 13   CREATININE 0.97 0.61 0.57* 0.48* 0.72  CALCIUM 8.1* 7.8* 7.9* 8.0* 7.9*  MG  --   --  1.8  --   --   PHOS  --   --  1.2*  --   --     CBC: Recent Labs  Lab 10/28/23 1114 10/29/23 0518 10/30/23 0018 10/30/23 0509 10/31/23 0338 11/01/23 0517  WBC 19.5* 13.1* 12.3* 10.8* 8.0 10.6*  NEUTROABS 17.3*  --   --  9.1* 6.1 8.1*  HGB 16.1 14.2 13.5 13.1 12.5* 12.8*  HCT 43.8 39.2 37.5* 36.1* 34.5* 35.9*  MCV 92.2 94.7 94.5 93.3 94.8 95.7  PLT 141* 107* 93* 84* 83* 91*     Coagulation Studies: Recent Labs    10/30/23 0018  LABPROT 19.3*  INR 1.6*    Imaging No new brain imaging overnight   ASSESSMENT AND PLAN: 63 year old male with 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  He presented on 10/28/2023 after a fall and was noted to have acute right  cerebellar infarct.  While in the hospital he was noted to be confused.  Routine EEG showed 1 brief seizure.  Therefore started on Keppra .  Continued to have intermittent confusion.  Therefore transferred to [facility] for long-term EEG.  Of note patient does have a history of alcohol abuse and did start drinking again recently.  Seizure Intermittent encephalopathy Acute cerebellar stroke -Video EEG overnight has not shown any definite ictal abnormality.  Will continue monitoring for another 24 to 48 hours - Will check B12, folate, thiamine  level and increase thiamine  to 500 mg for 5 days - Of note, patient is on cefepime .  It is not affecting his neurological status so far.  Therefore we will continue to monitor. - Continue Keppra  500 mg twice daily -Continue aspirin  81 mg daily for secondary stroke prevention -LDL 15.  Therefore not on statin.  Repeat LDL ordered and pending for tomorrow morning - TTE did not show any thrombus.  Recommend cardiac monitor at the time of discharge to paroxysmal A-fib if no A-fib seen during hospitalization - Continue seizure precautions  I have spent a total of 36   minutes with the patient reviewing hospital notes,  test results, labs and examining the patient as well as establishing an assessment and plan that was discussed  personally with the patient.  > 50% of time was spent in direct patient care.        Roxy Cordial Epilepsy [facility] For questions after 5pm please refer to AMION to reach the Neurologist on call

## 2023-11-01 NOTE — Progress Notes (Signed)
  Echocardiogram 2D Echocardiogram has been performed.  Abad Manard L Farhaan Mabee RDCS 11/01/2023, 9:34 AM

## 2023-11-01 NOTE — Evaluation (Addendum)
 Occupational Therapy Evaluation Patient Details Name: [name] MRN: [id number] DOB: 23-Apr-1951 Today's Date: 11/01/2023   History of Present Illness   [name] is a 63 y.o. male presenting with AMS.  Pt found to have severe sepsis, acute metabolic encephalopathy, acute on chronic alcoholic hepatitis, mild rhabdomyolysis, and hypokalemia.  Per neurologist note on 4/23 pt with cerebellar infarct. PMH: alcohol abuse, chronic pain syndrome on chronic narcotic therapy, IDDM, HTN     Clinical Impressions PTA, pt lives with spouse, typically active at baseline and independent in all ADLs, IADLs, driving and mobility without AD. Pt presents now with deficits in balance, strength, endurance and cognition. Overall, pt requires Min A for bed mobility, Mod A to stand w/ difficulty taking steps. Further mobility deferred due to ongoing EEG. Pt requiring Min A for UB ADLs and Mod A for LB ADLs d/t deficits. Based on current presentation, recommend continued inpatient follow up therapy, > 3 hours/day at DC.     If plan is discharge home, recommend the following:   A little help with walking and/or transfers;Help with stairs or ramp for entrance;A lot of help with bathing/dressing/bathroom;Assistance with cooking/housework;Direct supervision/assist for medications management;Direct supervision/assist for financial management;Assist for transportation     Functional Status Assessment   Patient has had a recent decline in their functional status and demonstrates the ability to make significant improvements in function in a reasonable and predictable amount of time.     Equipment Recommendations   BSC/3in1     Recommendations for Other Services         Precautions/Restrictions   Precautions Precautions: Fall Precaution/Restrictions Comments: cont EEG, rectal pouch Restrictions Weight Bearing Restrictions Per Provider Order: No     Mobility Bed Mobility Overal bed mobility: Needs  Assistance Bed Mobility: Supine to Sit, Sit to Supine     Supine to sit: Min assist Sit to supine: Min assist   General bed mobility comments: increased effort, use of bedrails and assist to lift trunk. light Min A for BLE back into bed    Transfers Overall transfer level: Needs assistance Equipment used: 1 person hand held assist Transfers: Sit to/from Stand Sit to Stand: Mod assist           General transfer comment: Mod A to rise from bedside with support provided around bottom. difficulty stepping, deferred further gait/transfers due to EEG      Balance Overall balance assessment: Needs assistance Sitting-balance support: Feet supported, No upper extremity supported Sitting balance-Leahy Scale: Fair Sitting balance - Comments: LOB with dynamic tasks   Standing balance support: Single extremity supported, During functional activity Standing balance-Leahy Scale: Poor                             ADL either performed or assessed with clinical judgement   ADL Overall ADL's : Needs assistance/impaired Eating/Feeding: Set up;Sitting   Grooming: Set up;Sitting   Upper Body Bathing: Minimal assistance;Sitting   Lower Body Bathing: Moderate assistance;Sitting/lateral leans;Sit to/from stand   Upper Body Dressing : Minimal assistance;Sitting   Lower Body Dressing: Moderate assistance;Sitting/lateral leans;Sit to/from stand Lower Body Dressing Details (indicate cue type and reason): increased effort/time and noted balance deficits when bending to don L sock sitting EOB. assistance provided for R foot due to fatigue     Toileting- Clothing Manipulation and Hygiene: Moderate assistance;Sit to/from stand;Sitting/lateral lean               Vision Baseline  Vision/History: 1 Wears glasses Ability to See in Adequate Light: 0 Adequate Patient Visual Report: No change from baseline Vision Assessment?: No apparent visual deficits     Perception          Praxis         Pertinent Vitals/Pain Pain Assessment Pain Assessment: Faces Faces Pain Scale: Hurts little more Pain Location: BLE in standing Pain Descriptors / Indicators: Sore, Burning Pain Intervention(s): Monitored during session, Limited activity within patient's tolerance     Extremity/Trunk Assessment Upper Extremity Assessment Upper Extremity Assessment: Generalized weakness;Right hand dominant   Lower Extremity Assessment Lower Extremity Assessment: Defer to PT evaluation   Cervical / Trunk Assessment Cervical / Trunk Assessment: Normal   Communication Communication Communication: No apparent difficulties   Cognition Arousal: Alert Behavior During Therapy: WFL for tasks assessed/performed Cognition: No family/caregiver present to determine baseline             OT - Cognition Comments: very pleasant, following directions, cues for problem solving and insight into physical deficits. slower processing                 Following commands: Impaired Following commands impaired: Only follows one step commands consistently, Follows one step commands with increased time, Follows multi-step commands inconsistently     Cueing  General Comments   Cueing Techniques: Verbal cues;Visual cues;Gestural cues  SpO2 low 90s on 2 L O2   Exercises     Shoulder Instructions      Home Living Family/patient expects to be discharged to:: Private residence Living Arrangements: Spouse/significant other Available Help at Discharge: Family;Available 24 hours/day Type of Home: House Home Access: Stairs to enter [employer] of Steps: 2 Entrance Stairs-Rails: Right;Left;Can reach both Home Layout: One level     Bathroom Shower/Tub: Producer, television/film/video: Standard     Home Equipment: Agricultural consultant (2 wheels);Cane - single point;Shower seat          Prior Functioning/Environment Prior Level of Function : Independent/Modified  Independent;Driving             Mobility Comments: Ind amb community distances without an AD, 2 falls in the last 6 months both from tripping while hiking in the woods ADLs Comments: Ind with ADLs, iADLs, gardens, drives, grocery shopping    OT Problem List: Decreased strength;Decreased activity tolerance;Impaired balance (sitting and/or standing)   OT Treatment/Interventions: Self-care/ADL training;Therapeutic exercise;Energy conservation;DME and/or AE instruction;Therapeutic activities;Balance training;Patient/family education      OT Goals(Current goals can be found in the care plan section)   Acute Rehab OT Goals Patient Stated Goal: have something to eat and drink, be able to get up to bathroom OT Goal Formulation: With patient Time For Goal Achievement: 11/15/23 Potential to Achieve Goals: Good ADL Goals Pt Will Perform Grooming: with contact guard assist;standing Pt Will Perform Lower Body Bathing: with min assist;sitting/lateral leans;sit to/from stand Pt Will Perform Lower Body Dressing: with min assist;sitting/lateral leans;sit to/from stand Pt Will Transfer to Toilet: with contact guard assist;ambulating   OT Frequency:  Min 2X/week    Co-evaluation              AM-PAC OT "6 Clicks" Daily Activity     Outcome Measure Help from another person eating meals?: None Help from another person taking care of personal grooming?: A Little Help from another person toileting, which includes using toliet, bedpan, or urinal?: A Lot Help from another person bathing (including washing, rinsing, drying)?: A Lot Help from another person to  put on and taking off regular upper body clothing?: A Little Help from another person to put on and taking off regular lower body clothing?: A Lot 6 Click Score: 16   End of Session Equipment Utilized During Treatment: Gait belt;Oxygen Nurse Communication: Mobility status  Activity Tolerance: Patient tolerated treatment well Patient  left: in bed;with call bell/phone within reach;with bed alarm set  OT Visit Diagnosis: Other abnormalities of gait and mobility (R26.89);Muscle weakness (generalized) (M62.81)                Time: 1610-9604 OT Time Calculation (min): 27 min Charges:  OT General Charges $OT Visit: 1 Visit OT Evaluation $OT Eval Moderate Complexity: 1 Mod OT Treatments $Self Care/Home Management : 8-22 mins  Lawrence Pretty, OTR/L Acute Rehab Services Office: [phone]   Annabella Barr 11/01/2023, 9:01 AM

## 2023-11-01 NOTE — Progress Notes (Signed)
 PROGRESS NOTE        PATIENT DETAILS Name: [name] Age: 63 y.o. Sex: male Date of Birth: 03/13/1951 Admit Date: 10/31/2023 Admitting Physician Johnetta Nab, MD RUE:[id number], Erlinda Haws, MD  Brief Summary: Patient is a 63 y.o.  male with history of EtOH use, HTN, DM-2, liver cirrhosis-who was admitted to [facility] for acute metabolic encephalopathy likely in the setting of new onset seizures-upon further evaluation-patient was also found to have aspiration pneumonia-MRI revealed acute CVA.  Patient was transferred to [facility] for LTM EEG.  Significant events: 4/24>> transferred to [facility] from [facility] for LTM EEG.  Significant studies: 4/21>> UDS: Negative 4/21>> A1c: 5.7 4/21>> CT head: No acute intracranial abnormality 4/21>> CT abdomen/pelvis: Patchy airspace opacities of both lungs, severe hepatic steatosis with contour irregularity of the liver suspicious for cirrhosis. 4/22>> MRI brain: Right cerebellar subacute infarction-no hemorrhage (addended report) 4/22>> Spot EEG: Left fronto-central seizure lasting 8 minutes. 4/23>> CT angio head/neck: No LVO or significant stenosis 4/24>> LDL: 15  Significant microbiology data: 4/21>> blood culture: Staph epidermidis (contamination) 4/22>> blood culture: Negative 4/24>> respiratory virus panel: Negative 4/24>> blood culture: Pending 4/25>> stool C. difficile PCR: Negative  Procedures: None  Consults: Neuro  Subjective: Lying comfortably in bed-denies any chest pain or shortness of breath.  Awake/alert-answering all questions appropriately.  Objective: Vitals: Blood pressure 115/67, pulse 82, temperature 99.4 F (37.4 C), resp. rate (!) 22, height 5\' 6"  (1.676 m), weight 63.5 kg, SpO2 96%.   Exam: Gen Exam:Alert awake-not in any distress HEENT:atraumatic, normocephalic Chest: B/L clear to auscultation anteriorly CVS:S1S2 regular Abdomen:soft non tender, non distended Extremities:no edema Neurology: Non  focal Skin: no rash  Pertinent Labs/Radiology:    Latest Ref Rng & Units 11/01/2023    5:17 AM 10/31/2023    3:38 AM 10/30/2023    5:09 AM  CBC  WBC 4.0 - 10.5 K/uL 10.6  8.0  10.8   Hemoglobin 13.0 - 17.0 g/dL 11.9  14.7  82.9   Hematocrit 39.0 - 52.0 % 35.9  34.5  36.1   Platelets 150 - 400 K/uL 91  83  84     Lab Results  Component Value Date   NA 133 (L) 11/01/2023   K 4.2 11/01/2023   CL 106 11/01/2023   CO2 20 (L) 11/01/2023      Assessment/Plan: Acute metabolic encephalopathy Secondary to sepsis physiology from pneumonia and seizure LTM EEG in progress, neuroimaging as above. Mental status has improved-completely awake and alert this morning.  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  Acute ischemic CVA Mental status improved-no obvious focal deficits on exam Per prior notes-suspicion that this may be embolic Workup as above Echo pending Telemetry negative for A-fib Will need to clarify with neurology when antiplatelets can be started  Sepsis secondary to presumed aspiration pneumonia Spiked fever yesterday in spite of being on Rocephin /Doxy/Flagyl -blood cultures repeated-now on vancomycin /cefepime /Flagyl  Will follow clinical course and attempt to narrow down antibiotic spectrum in the next day or so.  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   Weight as of this encounter: 63.5 kg.   Code status:   Code Status: Full Code   DVT Prophylaxis: enoxaparin  (LOVENOX ) injection 40 mg Start: 10/31/23 2100 SCDs Start: 10/31/23 1721    Family Communication: None at bedside   Disposition Plan: Status is: Inpatient Remains inpatient appropriate because: Severity of illness   Planned Discharge Destination:Home health versus SNF   Diet: Diet Order             Diet heart healthy/carb modified Room service appropriate? Yes; Fluid consistency: Thin; Fluid restriction: 1800 mL Fluid  Diet effective now                     Antimicrobial agents: Anti-infectives (From admission, onward)    Start     Dose/Rate Route Frequency Ordered Stop   11/01/23 0200  doxycycline  (VIBRAMYCIN ) 100 mg in sodium chloride  0.9 % 250 mL IVPB  Status:  Discontinued        100 mg 125 mL/hr over 120 Minutes Intravenous Every 12 hours 10/31/23 1722 10/31/23 1751   11/01/23 0008  vancomycin  (VANCOREADY) IVPB 750 mg/150 mL        750 mg 150 mL/hr over 60 Minutes Intravenous Every 12 hours 10/31/23 1814     10/31/23 2300  metroNIDAZOLE  (FLAGYL ) IVPB 500 mg        500 mg 100 mL/hr over 60 Minutes Intravenous Every 12 hours 10/31/23 1722     10/31/23 2000  cefTRIAXone  (ROCEPHIN ) 2 g in sodium chloride  0.9 % 100 mL IVPB  Status:  Discontinued        2 g 200 mL/hr over 30 Minutes Intravenous Every 24 hours 10/31/23 1722 10/31/23 1751   10/31/23 1915  Vancomycin  (VANCOCIN ) 1,250 mg in sodium chloride  0.9 % 250 mL IVPB        1,250 mg 166.7 mL/hr over 90 Minutes Intravenous  Once 10/31/23 1817 11/01/23 0230   10/31/23 1900  ceFEPIme  (MAXIPIME ) 2 g in sodium chloride  0.9 % 100 mL IVPB        2 g 200 mL/hr over 30 Minutes Intravenous Every 8 hours 10/31/23 1810     10/31/23 1900  vancomycin   (VANCOREADY) IVPB 1250 mg/250 mL  Status:  Discontinued        1,250 mg 166.7 mL/hr over 90 Minutes Intravenous  Once 10/31/23 1813 10/31/23 1817        MEDICATIONS: Scheduled Meds:  allopurinol   100 mg Oral Daily   dextromethorphan -guaiFENesin   1 tablet Oral BID   enoxaparin  (LOVENOX ) injection  40 mg Subcutaneous Q24H   folic acid   1 mg Oral Daily   insulin  aspart  0-15 Units Subcutaneous TID WC   levETIRAcetam   1,500 mg Oral BID   multivitamin with minerals  1 tablet Oral Daily   pantoprazole   40 mg Oral QPM   sertraline   100 mg Oral Daily   sodium chloride  flush  3 mL Intravenous Q12H   thiamine   100 mg Oral Daily   Or   thiamine   100 mg Intravenous Daily   Continuous Infusions:  ceFEPime  (MAXIPIME ) IV 2 g (11/01/23 0618)  metronidazole  Stopped (11/01/23 0115)   vancomycin      PRN Meds:.acetaminophen  **OR** acetaminophen , bisacodyl , hydrALAZINE , HYDROmorphone  (DILAUDID ) injection, LORazepam  **OR** LORazepam , ondansetron  **OR** ondansetron  (ZOFRAN ) IV, oxyCODONE , senna-docusate   I have personally reviewed following labs and imaging studies  LABORATORY DATA: CBC: Recent Labs  Lab 10/28/23 1114 10/29/23 0518 10/30/23 0018 10/30/23 0509 10/31/23 0338 11/01/23 0517  WBC 19.5* 13.1* 12.3* 10.8* 8.0 10.6*  NEUTROABS 17.3*  --   --  9.1* 6.1 8.1*  HGB 16.1 14.2 13.5 13.1 12.5* 12.8*  HCT 43.8 39.2 37.5* 36.1* 34.5* 35.9*  MCV 92.2 94.7 94.5 93.3 94.8 95.7  PLT 141* 107* 93* 84* 83* 91*    Basic Metabolic Panel: Recent Labs  Lab 10/28/23 1114 10/29/23 0518 10/30/23 0509 10/31/23 0338 11/01/23 0517  NA 137 136 129* 136 133*  K 3.1* 4.0 3.2* 3.1* 4.2  CL 98 100 101 106 106  CO2 25 21* 22 20* 20*  GLUCOSE 87 140* 172* 116* 166*  BUN 25* 23 15 14 13   CREATININE 0.97 0.61 0.57* 0.48* 0.72  CALCIUM 8.1* 7.8* 7.9* 8.0* 7.9*  MG  --   --  1.8  --   --   PHOS  --   --  1.2*  --   --     GFR: Estimated Creatinine Clearance: 73.9 mL/min (by C-G formula based  on SCr of 0.72 mg/dL).  Liver Function Tests: Recent Labs  Lab 10/28/23 1114 10/29/23 0518 10/30/23 0509 11/01/23 0517  AST 132* 181* 139* 37  ALT 47* 63* 79* 51*  ALKPHOS 178* 130* 122 99  BILITOT 3.7* 3.0* 3.2* 1.9*  PROT 6.3* 5.6* 5.4* 4.9*  ALBUMIN 3.2* 2.7* 2.7* 2.3*   No results for input(s): "LIPASE", "AMYLASE" in the last 168 hours. Recent Labs  Lab 10/28/23 1114  AMMONIA 27    Coagulation Profile: Recent Labs  Lab 10/28/23 1114 10/30/23 0018  INR 1.4* 1.6*    Cardiac Enzymes: Recent Labs  Lab 10/28/23 1114 10/29/23 0518 10/29/23 1529  CKTOTAL 2,356* 1,847* 1,574*    BNP (last 3 results) No results for input(s): "PROBNP" in the last 8760 hours.  Lipid Profile: Recent Labs    10/31/23 0338  CHOL 46  HDL 24*  LDLCALC 15  TRIG 35  CHOLHDL 1.9    Thyroid Function Tests: No results for input(s): "TSH", "T4TOTAL", "FREET4", "T3FREE", "THYROIDAB" in the last 72 hours.  Anemia Panel: No results for input(s): "VITAMINB12", "FOLATE", "FERRITIN", "TIBC", "IRON", "RETICCTPCT" in the last 72 hours.  Urine analysis:    Component Value Date/Time   COLORURINE AMBER (A) 10/28/2023 1114   APPEARANCEUR CLEAR (A) 10/28/2023 1114   LABSPEC 1.023 10/28/2023 1114   PHURINE 5.0 10/28/2023 1114   GLUCOSEU NEGATIVE 10/28/2023 1114   HGBUR NEGATIVE 10/28/2023 1114   BILIRUBINUR NEGATIVE 10/28/2023 1114   KETONESUR 20 (A) 10/28/2023 1114   PROTEINUR 100 (A) 10/28/2023 1114   NITRITE NEGATIVE 10/28/2023 1114   LEUKOCYTESUR NEGATIVE 10/28/2023 1114    Sepsis Labs: Lactic Acid, Venous    Component Value Date/Time   LATICACIDVEN 1.0 10/30/2023 1912    MICROBIOLOGY: Recent Results (from the past 240 hours)  Blood Culture (routine x 2)     Status: None (Preliminary result)   Collection Time: 10/28/23 11:14 AM   Specimen: BLOOD  Result Value Ref Range Status   Specimen Description BLOOD LEFT ARM  Final   Special Requests   Final    BOTTLES DRAWN AEROBIC  AND ANAEROBIC Blood Culture results may not  be optimal due to an inadequate volume of blood received in culture bottles   Culture   Final    NO GROWTH 4 DAYS Performed at [facility], [address] [place], [place] [address]    Report Status PENDING  Incomplete  Blood Culture (routine x 2)     Status: Abnormal   Collection Time: 10/28/23 11:14 AM   Specimen: BLOOD  Result Value Ref Range Status   Specimen Description   Final    BLOOD RIGHT ARM Performed at [facility], [address], [place], [place] [address]    Special Requests   Final    BOTTLES DRAWN AEROBIC AND ANAEROBIC Blood Culture results may not be optimal due to an inadequate volume of blood received in culture bottles Performed at [facility], [address] [place], [place] [address]    Culture  Setup Time   Final    GRAM POSITIVE COCCI AEROBIC BOTTLE ONLY CRITICAL RESULT CALLED TO, READ BACK BY AND VERIFIED WITH: WILL Alva Jewels [facility] 9147 10/29/23 HNM    Culture (A)  Final    STAPHYLOCOCCUS EPIDERMIDIS THE SIGNIFICANCE OF ISOLATING THIS ORGANISM FROM A SINGLE SET OF BLOOD CULTURES WHEN MULTIPLE SETS ARE DRAWN IS UNCERTAIN. PLEASE NOTIFY THE MICROBIOLOGY DEPARTMENT WITHIN ONE WEEK IF SPECIATION AND SENSITIVITIES ARE REQUIRED. Performed at [facility], [address] [place], [place] [address]    Report Status 10/31/2023 FINAL  Final  Resp panel by RT-PCR (RSV, Flu A&B, Covid) Urine, Catheterized     Status: None   Collection Time: 10/28/23 11:14 AM   Specimen: Urine, Catheterized; Nasal Swab  Result Value Ref Range Status   SARS Coronavirus 2 by RT PCR NEGATIVE NEGATIVE Final    Comment: (NOTE) SARS-CoV-2 target nucleic acids are NOT DETECTED.  The SARS-CoV-2 RNA is generally detectable in upper respiratory specimens during the acute phase of infection. The lowest concentration of SARS-CoV-2 viral copies this assay can detect is 138 copies/mL. A negative result does not  preclude SARS-Cov-2 infection and should not be used as the sole basis for treatment or other patient management decisions. A negative result may occur with  improper specimen collection/handling, submission of specimen other than nasopharyngeal swab, presence of viral mutation(s) within the areas targeted by this assay, and inadequate number of viral copies(<138 copies/mL). A negative result must be combined with clinical observations, patient history, and epidemiological information. The expected result is Negative.  Fact Sheet for Patients:  BloggerCourse.com  Fact Sheet for Healthcare Providers:  SeriousBroker.it  This test is no t yet approved or cleared by the [place]  FDA and  has been authorized for detection and/or diagnosis of SARS-CoV-2 by FDA under an Emergency Use Authorization (EUA). This EUA will remain  in effect (meaning this test can be used) for the duration of the COVID-19 declaration under Section 564(b)(1) of the Act, 21 U.S.C.section 360bbb-3(b)(1), unless the authorization is terminated  or revoked sooner.       Influenza A by PCR NEGATIVE NEGATIVE Final   Influenza B by PCR NEGATIVE NEGATIVE Final    Comment: (NOTE) The Xpert Xpress SARS-CoV-2/FLU/RSV plus assay is intended as an aid in the diagnosis of influenza from Nasopharyngeal swab specimens and should not be used as a sole basis for treatment. Nasal washings and aspirates are unacceptable for Xpert Xpress SARS-CoV-2/FLU/RSV testing.  Fact Sheet for Patients: BloggerCourse.com  Fact Sheet for Healthcare Providers: SeriousBroker.it  This test is not yet approved or cleared by the [place]  [place] FDA and has been authorized for detection and/or diagnosis of SARS-CoV-2 by FDA under an Emergency Use Authorization (EUA). This EUA will remain in effect (meaning this test can be used) for the  duration of the COVID-19 declaration under Section 564(b)(1) of the Act, 21 U.S.C. section 360bbb-3(b)(1), unless the authorization is terminated or revoked.     Resp Syncytial Virus by PCR NEGATIVE NEGATIVE Final    Comment: (NOTE) Fact Sheet for Patients: BloggerCourse.com  Fact Sheet for Healthcare Providers: SeriousBroker.it  This test is not yet approved or cleared by the [place]  FDA and has been authorized for detection and/or diagnosis of SARS-CoV-2 by FDA under an Emergency Use Authorization (EUA). This EUA will remain in effect (meaning this test can be used) for the duration of the COVID-19 declaration under Section 564(b)(1) of the Act, 21 U.S.C. section 360bbb-3(b)(1), unless the authorization is terminated or revoked.  Performed at [facility], [address] [place], [place] [address]   Blood Culture ID Panel (Reflexed)     Status: Abnormal   Collection Time: 10/28/23 11:14 AM  Result Value Ref Range Status   Enterococcus faecalis NOT DETECTED NOT DETECTED Final   Enterococcus Faecium NOT DETECTED NOT DETECTED Final   Listeria monocytogenes NOT DETECTED NOT DETECTED Final   Staphylococcus species DETECTED (A) NOT DETECTED Final    Comment: CRITICAL RESULT CALLED TO, READ BACK BY AND VERIFIED WITH: Lafrances Pigeon [facility] [id number] 10/29/23 HNM    Staphylococcus aureus (BCID) NOT DETECTED NOT DETECTED Final   Staphylococcus epidermidis DETECTED (A) NOT DETECTED Final    Comment: CRITICAL RESULT CALLED TO, READ BACK BY AND VERIFIED WITH: Lafrances Pigeon [facility] [id number] 10/29/23 HNM    Staphylococcus lugdunensis NOT DETECTED NOT DETECTED Final   Streptococcus species NOT DETECTED NOT DETECTED Final   Streptococcus agalactiae NOT DETECTED NOT DETECTED Final   Streptococcus pneumoniae NOT DETECTED NOT DETECTED Final   Streptococcus pyogenes NOT DETECTED NOT DETECTED Final   A.calcoaceticus-baumannii NOT DETECTED  NOT DETECTED Final   Bacteroides fragilis NOT DETECTED NOT DETECTED Final   Enterobacterales NOT DETECTED NOT DETECTED Final   Enterobacter cloacae complex NOT DETECTED NOT DETECTED Final   Escherichia coli NOT DETECTED NOT DETECTED Final   Klebsiella aerogenes NOT DETECTED NOT DETECTED Final   Klebsiella oxytoca NOT DETECTED NOT DETECTED Final   Klebsiella pneumoniae NOT DETECTED NOT DETECTED Final   Proteus species NOT DETECTED NOT DETECTED Final   Salmonella species NOT DETECTED NOT DETECTED Final   Serratia marcescens NOT DETECTED NOT DETECTED Final   Haemophilus influenzae NOT DETECTED NOT DETECTED Final   Neisseria meningitidis NOT DETECTED NOT DETECTED Final   Pseudomonas aeruginosa NOT DETECTED NOT DETECTED Final   Stenotrophomonas maltophilia NOT DETECTED NOT DETECTED Final   Candida albicans NOT DETECTED NOT DETECTED Final   Candida auris NOT DETECTED NOT DETECTED Final   Candida glabrata NOT DETECTED NOT DETECTED Final   Candida krusei NOT DETECTED NOT DETECTED Final   Candida parapsilosis NOT DETECTED NOT DETECTED Final   Candida tropicalis NOT DETECTED NOT DETECTED Final   Cryptococcus neoformans/gattii NOT DETECTED NOT DETECTED Final   Methicillin resistance mecA/C NOT DETECTED NOT DETECTED Final    Comment: Performed at [facility], [address] [place], [place] [address]  C Difficile Quick Screen w PCR reflex     Status: None   Collection Time: 10/29/23  5:42 PM   Specimen: STOOL  Result Value Ref Range Status   C Diff antigen NEGATIVE NEGATIVE Final  C Diff toxin NEGATIVE NEGATIVE Final   C Diff interpretation No C. difficile detected.  Final    Comment: Performed at [facility], [address] [place], [place] [address]  Gastrointestinal Panel by PCR , Stool     Status: None   Collection Time: 10/29/23  5:42 PM   Specimen: STOOL  Result Value Ref Range Status   Campylobacter species NOT DETECTED NOT DETECTED Final   Plesimonas  shigelloides NOT DETECTED NOT DETECTED Final   Salmonella species NOT DETECTED NOT DETECTED Final   Yersinia enterocolitica NOT DETECTED NOT DETECTED Final   Vibrio species NOT DETECTED NOT DETECTED Final   Vibrio cholerae NOT DETECTED NOT DETECTED Final   Enteroaggregative E coli (EAEC) NOT DETECTED NOT DETECTED Final   Enteropathogenic E coli (EPEC) NOT DETECTED NOT DETECTED Final   Enterotoxigenic E coli (ETEC) NOT DETECTED NOT DETECTED Final   Shiga like toxin producing E coli (STEC) NOT DETECTED NOT DETECTED Final   Shigella/Enteroinvasive E coli (EIEC) NOT DETECTED NOT DETECTED Final   Cryptosporidium NOT DETECTED NOT DETECTED Final   Cyclospora cayetanensis NOT DETECTED NOT DETECTED Final   Entamoeba histolytica NOT DETECTED NOT DETECTED Final   Giardia lamblia NOT DETECTED NOT DETECTED Final   Adenovirus F40/41 NOT DETECTED NOT DETECTED Final   Astrovirus NOT DETECTED NOT DETECTED Final   Norovirus GI/GII NOT DETECTED NOT DETECTED Final   Rotavirus A NOT DETECTED NOT DETECTED Final   Sapovirus (I, II, IV, and V) NOT DETECTED NOT DETECTED Final    Comment: Performed at [facility], [address] [place], [place] [address]  Culture, blood (single) w Reflex to ID Panel     Status: None (Preliminary result)   Collection Time: 10/29/23  5:55 PM   Specimen: BLOOD  Result Value Ref Range Status   Specimen Description BLOOD LEFT ANTECUBITAL  Final   Special Requests   Final    BOTTLES DRAWN AEROBIC AND ANAEROBIC Blood Culture adequate volume   Culture   Final    NO GROWTH 3 DAYS Performed at [facility], [address] [place], [place] [address]    Report Status PENDING  Incomplete  Respiratory (~20 pathogens) panel by PCR     Status: None   Collection Time: 10/31/23  5:50 PM   Specimen: Nasopharyngeal Swab; Respiratory  Result Value Ref Range Status   Adenovirus NOT DETECTED NOT DETECTED Final   Coronavirus 229E NOT DETECTED NOT DETECTED Final     Comment: (NOTE) The Coronavirus on the Respiratory Panel, DOES NOT test for the novel  Coronavirus (2019 nCoV)    Coronavirus HKU1 NOT DETECTED NOT DETECTED Final   Coronavirus NL63 NOT DETECTED NOT DETECTED Final   Coronavirus OC43 NOT DETECTED NOT DETECTED Final   Metapneumovirus NOT DETECTED NOT DETECTED Final   Rhinovirus / Enterovirus NOT DETECTED NOT DETECTED Final   Influenza A NOT DETECTED NOT DETECTED Final   Influenza B NOT DETECTED NOT DETECTED Final   Parainfluenza Virus 1 NOT DETECTED NOT DETECTED Final   Parainfluenza Virus 2 NOT DETECTED NOT DETECTED Final   Parainfluenza Virus 3 NOT DETECTED NOT DETECTED Final   Parainfluenza Virus 4 NOT DETECTED NOT DETECTED Final   Respiratory Syncytial Virus NOT DETECTED NOT DETECTED Final   Bordetella pertussis NOT DETECTED NOT DETECTED Final   Bordetella Parapertussis NOT DETECTED NOT DETECTED Final   Chlamydophila pneumoniae NOT DETECTED NOT DETECTED Final   Mycoplasma pneumoniae NOT DETECTED NOT DETECTED Final    Comment: Performed at  [facility], [address], [place], [place] [address]  C Difficile Quick Screen w PCR reflex     Status: None   Collection Time: 11/01/23 12:36 AM   Specimen: STOOL  Result Value Ref Range Status   C Diff antigen NEGATIVE NEGATIVE Final   C Diff toxin NEGATIVE NEGATIVE Final   C Diff interpretation No C. difficile detected.  Final    Comment: Performed at [facility], [address], [place], [place] [address]    RADIOLOGY STUDIES/RESULTS: DG CHEST PORT 1 VIEW Result Date: 10/31/2023 CLINICAL DATA:  [id number] Dyspnea [id number] EXAM: PORTABLE CHEST 1 VIEW COMPARISON:  Chest x-ray 10/29/2023, CT angio head and neck 10/30/2023 FINDINGS: The heart and mediastinal contours are unchanged. Slightly worsened interstitial and airspace opacities most prominent along the right upper lung zone. Left base atelectasis. No pleural effusion. No pneumothorax. No acute osseous abnormality. Right upper  quadrant surgical clips. IMPRESSION: Slightly worsened interstitial and airspace opacities most prominent along the right upper lung zone. Findings could represent a combination of infectious and/or edema. Electronically Signed   By: Morgane  Naveau M.D.   On: 10/31/2023 19:45     LOS: 1 day   Kimberly Penna, MD  [facility]    To contact the attending provider between 7A-7P or the covering provider during after hours 7P-7A, please log into the web site [email or web address] and access using universal [place] password for that web site. If you do not have the password, please call the hospital operator.  11/01/2023, 7:35 AM

## 2023-11-01 NOTE — Progress Notes (Signed)

## 2023-11-01 NOTE — Procedures (Addendum)
 Patient Name: [name]  MRN: [id number]  Epilepsy Attending: Arleene Lack  Referring Physician/Provider: Eleni Griffin, MD  Duration: 10/31/2023 2105 to 11/02/2023 8469  Patient history: 73yo M with intermittent confusion. EEG to evaluate for seizure  Level of alertness: Awake, asleep  AEDs during EEG study: LEV  Technical aspects: This EEG study was done with scalp electrodes positioned according to the 10-20 International system of electrode placement. Electrical activity was reviewed with band pass filter of 1-70Hz , sensitivity of 7 uV/mm, display speed of 78mm/sec with a 60Hz  notched filter applied as appropriate. EEG data were recorded continuously and digitally stored.  Video monitoring was available and reviewed as appropriate.  Description: The posterior dominant rhythm consists of 9-10 Hz activity of moderate voltage (25-35 uV) seen predominantly in posterior head regions, symmetric and reactive to eye opening and eye closing. There is intermittent generalized 3-5hz  theta-delta slowing admixed with 13 to 15 Hz beta activity distributed symmetrically and diffusely.  Hyperventilation and photic stimulation were not performed.     EEG was not interpretable between 11/01/2023 0325 to 1034 due to significant electrode artifact  ABNORMALITY - Intermittent slow, generalized  IMPRESSION: This study is suggestive of mild diffuse encephalopathy. No seizures or epileptiform discharges were seen throughout the recording.  Lelania Bia O Zen [place]

## 2023-11-02 ENCOUNTER — Inpatient Hospital Stay (HOSPITAL_COMMUNITY)

## 2023-11-02 DIAGNOSIS — I10 Essential (primary) hypertension: Secondary | ICD-10-CM | POA: Diagnosis not present

## 2023-11-02 DIAGNOSIS — R569 Unspecified convulsions: Secondary | ICD-10-CM | POA: Diagnosis not present

## 2023-11-02 DIAGNOSIS — G9340 Encephalopathy, unspecified: Secondary | ICD-10-CM | POA: Diagnosis not present

## 2023-11-02 DIAGNOSIS — F1090 Alcohol use, unspecified, uncomplicated: Secondary | ICD-10-CM | POA: Diagnosis not present

## 2023-11-02 DIAGNOSIS — K219 Gastro-esophageal reflux disease without esophagitis: Secondary | ICD-10-CM | POA: Diagnosis not present

## 2023-11-02 LAB — MAGNESIUM: Magnesium: 1.7 mg/dL (ref 1.7–2.4)

## 2023-11-02 LAB — LIPID PANEL
Cholesterol: 39 mg/dL (ref 0–200)
HDL: 17 mg/dL — ABNORMAL LOW (ref >40)
LDL Cholesterol: 15 mg/dL (ref 0–99)
Total CHOL/HDL Ratio: 2.3 ratio
Triglycerides: 37 mg/dL (ref <150)
VLDL: 7 mg/dL (ref 0–40)

## 2023-11-02 LAB — GLUCOSE, CAPILLARY
Glucose-Capillary: 164 mg/dL — ABNORMAL HIGH (ref 70–99)
Glucose-Capillary: 176 mg/dL — ABNORMAL HIGH (ref 70–99)
Glucose-Capillary: 230 mg/dL — ABNORMAL HIGH (ref 70–99)
Glucose-Capillary: 224 mg/dL — ABNORMAL HIGH (ref 70–99)

## 2023-11-02 LAB — CBC WITH DIFFERENTIAL/PLATELET
Abs Immature Granulocytes: 0.17 10*3/uL — ABNORMAL HIGH (ref 0.00–0.07)
Basophils Absolute: 0.1 10*3/uL (ref 0.0–0.1)
Basophils Relative: 1 %
Eosinophils Absolute: 0.2 10*3/uL (ref 0.0–0.5)
Eosinophils Relative: 2 %
HCT: 35.6 % — ABNORMAL LOW (ref 39.0–52.0)
Hemoglobin: 12.7 g/dL — ABNORMAL LOW (ref 13.0–17.0)
Immature Granulocytes: 2 %
Lymphocytes Relative: 6 %
Lymphs Abs: 0.7 10*3/uL (ref 0.7–4.0)
MCH: 33.8 pg (ref 26.0–34.0)
MCHC: 35.7 g/dL (ref 30.0–36.0)
MCV: 94.7 fL (ref 80.0–100.0)
Monocytes Absolute: 1.4 10*3/uL — ABNORMAL HIGH (ref 0.1–1.0)
Monocytes Relative: 13 %
Neutro Abs: 8.2 10*3/uL — ABNORMAL HIGH (ref 1.7–7.7)
Neutrophils Relative %: 76 %
Platelets: 101 10*3/uL — ABNORMAL LOW (ref 150–400)
RBC: 3.76 MIL/uL — ABNORMAL LOW (ref 4.22–5.81)
RDW: 14.6 % (ref 11.5–15.5)
WBC: 10.8 10*3/uL — ABNORMAL HIGH (ref 4.0–10.5)
nRBC: 0 % (ref 0.0–0.2)

## 2023-11-02 LAB — BASIC METABOLIC PANEL WITH GFR
Anion gap: 8 (ref 5–15)
BUN: 11 mg/dL (ref 8–23)
CO2: 20 mmol/L — ABNORMAL LOW (ref 22–32)
Calcium: 8.1 mg/dL — ABNORMAL LOW (ref 8.9–10.3)
Chloride: 105 mmol/L (ref 98–111)
Creatinine, Ser: 0.63 mg/dL (ref 0.61–1.24)
GFR, Estimated: >60 mL/min (ref >60)
Glucose, Bld: 174 mg/dL — ABNORMAL HIGH (ref 70–99)
Potassium: 3.4 mmol/L — ABNORMAL LOW (ref 3.5–5.1)
Sodium: 133 mmol/L — ABNORMAL LOW (ref 135–145)

## 2023-11-02 LAB — FOLATE: Folate: 32.9 ng/mL (ref >5.9)

## 2023-11-02 LAB — CULTURE, BLOOD (ROUTINE X 2): Culture: NO GROWTH

## 2023-11-02 LAB — VITAMIN B12: Vitamin B-12: 1331 pg/mL — ABNORMAL HIGH (ref 180–914)

## 2023-11-02 LAB — AMMONIA: Ammonia: 36 umol/L — ABNORMAL HIGH (ref 9–35)

## 2023-11-02 LAB — TSH: TSH: 4.762 u[IU]/mL — ABNORMAL HIGH (ref 0.350–4.500)

## 2023-11-02 LAB — PHOSPHORUS: Phosphorus: 1.7 mg/dL — ABNORMAL LOW (ref 2.5–4.6)

## 2023-11-02 MED ORDER — MAGNESIUM SULFATE 2 GM/50ML IV SOLN
2.0000 g | Freq: Once | INTRAVENOUS | Status: AC
  Administered 2023-11-02: 2 g via INTRAVENOUS
  Filled 2023-11-02: qty 50

## 2023-11-02 MED ORDER — POTASSIUM CHLORIDE CRYS ER 20 MEQ PO TBCR
40.0000 meq | EXTENDED_RELEASE_TABLET | Freq: Once | ORAL | Status: AC
  Administered 2023-11-02: 40 meq via ORAL
  Filled 2023-11-02: qty 2

## 2023-11-02 MED ORDER — POTASSIUM PHOSPHATES 15 MMOLE/5ML IV SOLN
30.0000 mmol | Freq: Once | INTRAVENOUS | Status: AC
  Administered 2023-11-02: 30 mmol via INTRAVENOUS
  Filled 2023-11-02: qty 10

## 2023-11-02 MED ORDER — SODIUM CHLORIDE 0.9 % IV SOLN
3.0000 g | Freq: Four times a day (QID) | INTRAVENOUS | Status: DC
  Administered 2023-11-02 – 2023-11-04 (×7): 3 g via INTRAVENOUS
  Filled 2023-11-02 (×7): qty 8

## 2023-11-02 NOTE — Procedures (Signed)
 Patient Name: Pearly Hanko  MRN: [id number]  Epilepsy Attending: Arleene Lack  Referring Physician/Provider: Eleni Griffin, MD  Duration: 11/02/2023 3474 to 11/03/2023 0415   Patient history: 63yo M with intermittent confusion. EEG to evaluate for seizure   Level of alertness: Awake, asleep   AEDs during EEG study: LEV   Technical aspects: This EEG study was done with scalp electrodes positioned according to the 10-20 International system of electrode placement. Electrical activity was reviewed with band pass filter of 1-70Hz , sensitivity of 7 uV/mm, display speed of 81mm/sec with a 60Hz  notched filter applied as appropriate. EEG data were recorded continuously and digitally stored.  Video monitoring was available and reviewed as appropriate.   Description: The posterior dominant rhythm consists of 9-10 Hz activity of moderate voltage (25-35 uV) seen predominantly in posterior head regions, symmetric and reactive to eye opening and eye closing. There is intermittent generalized 3-5hz  theta-delta slowing admixed with 13 to 15 Hz beta activity distributed symmetrically and diffusely.  Hyperventilation and photic stimulation were not performed.      ABNORMALITY - Intermittent slow, generalized   IMPRESSION: This study is suggestive of mild diffuse encephalopathy. No seizures or epileptiform discharges were seen throughout the recording.   Ceciley Buist O Stanely Sexson

## 2023-11-02 NOTE — Plan of Care (Signed)

## 2023-11-02 NOTE — Progress Notes (Addendum)
 Subjective: [name].  Reports generalized pain secondary to fall.  Denies any other concerns.  Family at bedside, no any further confusion episodes.  Patient is unaware of any further confusion episodes.  LTM monitoring remains in place  ROS: negative except above  Examination  Vital signs in last 24 hours: Temp:  [98.2 F (36.8 C)-100.2 F (37.9 C)] 98.2 F (36.8 C) (04/26 0802) Pulse Rate:  [80-105] 81 (04/26 0802) Resp:  [20-25] 20 (04/26 0802) BP: (104-126)/(65-106) 109/66 (04/26 0802) SpO2:  [94 %-97 %] 96 % (04/26 0802) Weight:  [65.3 kg] 65.3 kg (04/26 0423)  General: lying in bed Neuro: MS: Alert, oriented, follows commands CN: pupils equal and reactive,  EOMI, face symmetric, tongue midline, normal sensation over face, Motor: 5/5 strength in bilateral upper extremities, 4/5 in bilateral lower extremities Coordination: normal Gait: not tested  Basic Metabolic Panel: Recent Labs  Lab 10/29/23 0518 10/30/23 0509 10/31/23 0338 11/01/23 0517 11/02/23 0333  NA 136 129* 136 133* 133*  K 4.0 3.2* 3.1* 4.2 3.4*  CL 100 101 106 106 105  CO2 21* 22 20* 20* 20*  GLUCOSE 140* 172* 116* 166* 174*  BUN 23 15 14 13 11   CREATININE 0.61 0.57* 0.48* 0.72 0.63  CALCIUM 7.8* 7.9* 8.0* 7.9* 8.1*  MG  --  1.8  --   --  1.7  PHOS  --  1.2*  --   --  1.7*   CBC: Recent Labs  Lab 10/28/23 1114 10/29/23 0518 10/30/23 0018 10/30/23 0509 10/31/23 0338 11/01/23 0517 11/02/23 0333  WBC 19.5*   < > 12.3* 10.8* 8.0 10.6* 10.8*  NEUTROABS 17.3*  --   --  9.1* 6.1 8.1* 8.2*  HGB 16.1   < > 13.5 13.1 12.5* 12.8* 12.7*  HCT 43.8   < > 37.5* 36.1* 34.5* 35.9* 35.6*  MCV 92.2   < > 94.5 93.3 94.8 95.7 94.7  PLT 141*   < > 93* 84* 83* 91* 101*   < > = values in this interval not displayed.   Coagulation Studies: No results for input(s): "LABPROT", "INR" in the last 72 hours. 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  While in the hospital he was noted to be confused.  Routine EEG showed 1 brief seizure.  Therefore started on Keppra .  Continued to have intermittent confusion.  Therefore transferred to [facility] for long-term EEG.  Of note patient does have a history of alcohol abuse and did start drinking again recently.  Seizure Intermittent encephalopathy Acute cerebellar stroke -Video EEG overnight has not shown any definite ictal abnormality.  OK to d/c EEG in AM if no seizures overnight. - Continue thiamine  500 mg for 5 days; level not drawn prior to supplementation - Of note, patient is on cefepime .  It is not affecting his neurological status so far.  Therefore we will continue to monitor. - Continue Keppra  500 mg twice daily -Continue aspirin  81 mg daily for secondary stroke prevention -LDL 15.  Therefore not on statin.  - TTE did not show any thrombus.  Recommend cardiac monitor at  the time of discharge to paroxysmal A-fib if no A-fib seen during hospitalization - Continue seizure precautions - Will continue to follow  Greg Leaks, MD [facility] [phone]  If 7pm- 7am, please page neurology on call as listed in AMION.

## 2023-11-02 NOTE — Progress Notes (Signed)
 PROGRESS NOTE        PATIENT DETAILS Name: [name] Age: 63 y.o. Sex: male Date of Birth: 1951-01-04 Admit Date: 10/31/2023 Admitting Physician Johnetta Nab, MD NWG:[id number], Erlinda Haws, MD  Brief Summary: Patient is a 63 y.o.  male with history of EtOH use, HTN, DM-2, liver cirrhosis-who was admitted to [facility] for acute metabolic encephalopathy likely in the setting of new onset seizures-upon further evaluation-patient was also found to have aspiration pneumonia-MRI revealed acute CVA.  Patient was transferred to [facility] for LTM EEG.  Significant events: 4/24>> transferred to [facility] from [facility] for LTM EEG.  Significant studies: 4/21>> UDS: Negative 4/21>> A1c: 5.7 4/21>> CT head: No acute intracranial abnormality 4/21>> CT abdomen/pelvis: Patchy airspace opacities of both lungs, severe hepatic steatosis with contour irregularity of the liver suspicious for cirrhosis. 4/22>> MRI brain: Right cerebellar subacute infarction-no hemorrhage (addended report) 4/22>> Spot EEG: Left fronto-central seizure lasting 8 minutes. 4/23>> CT angio head/neck: No LVO or significant stenosis 4/24>> LDL: 15 4/24-4/25>> LTM EEG: No seizures. 4/25>> echo: EF 55-60%, grade 1 diastolic dysfunction.  Significant microbiology data: 4/21>> blood culture: Staph epidermidis (contamination) 4/22>> blood culture: Negative 4/24>> respiratory virus panel: Negative 4/24>> blood culture: No growth 4/25>> stool C. difficile PCR: Negative  Procedures: None  Consults: Neuro  Subjective: No major issues overnight-lying comfortably in bed.  Denies any chest pain or shortness of breath.  Objective: Vitals: Blood pressure 109/66, pulse 81, temperature 98.2 F (36.8 C), temperature source Oral, resp. rate 20, height 5\' 6"  (1.676 m), weight 65.3 kg, SpO2 96%.   Exam: Gen Exam:Alert awake-not in any distress HEENT:atraumatic, normocephalic Chest: B/L clear to auscultation  anteriorly CVS:S1S2 regular Abdomen:soft non tender, non distended Extremities:no edema Neurology: Non focal Skin: no rash  Pertinent Labs/Radiology:    Latest Ref Rng & Units 11/02/2023    3:33 AM 11/01/2023    5:17 AM 10/31/2023    3:38 AM  CBC  WBC 4.0 - 10.5 K/uL 10.8  10.6  8.0   Hemoglobin 13.0 - 17.0 g/dL 08.6  57.8  46.9   Hematocrit 39.0 - 52.0 % 35.6  35.9  34.5   Platelets 150 - 400 K/uL 101  91  83     Lab Results  Component Value Date   NA 133 (L) 11/02/2023   K 3.4 (L) 11/02/2023   CL 105 11/02/2023   CO2 20 (L) 11/02/2023      Assessment/Plan: Acute metabolic encephalopathy Secondary to sepsis physiology from pneumonia and seizure LTM EEG negative so far but still in progress Neuroimaging as above Mental status continues to improve-relatively awake/alert this morning.  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 Neurology following.  Acute ischemic CVA Mental status improved-no obvious focal deficits on exam Per prior notes-suspicion that this may be embolic Remains on aspirin  Continue telemetry monitoring Neurology recommending outpatient cardiac monitor-will need to contact cardiology when closer to discharge.  Sepsis secondary to presumed aspiration pneumonia Clinically improved  Repeat cultures negative Suspect predominantly aspiration pneumonia-will switch to Unasyn.  Stop vancomycin /cefepime /Flagyl  today.  Hypokalemia/hypophosphatemia/hypomagnesemia Related to EtOH use Replete/recheck.  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  Alcoholic hepatitis Significant improvement in transaminitis Follow LFTs periodically  EtOH use Awake/alert No major tremors Ativan  per CIWA protocol  GERD PPI  Chronic opiate dependence/chronic pain syndrome Continue as needed oxycodone   Mood disorder Appears stable Zoloft   BMI: Estimated body mass index is 23.24 kg/m as calculated from the following:   Height as of this encounter: 5\' 6"  (1.676 m).   Weight as of this encounter: 65.3 kg.   Code status:   Code Status: Full Code   DVT Prophylaxis: enoxaparin  (LOVENOX ) injection 40 mg Start: 10/31/23 2100 SCDs Start: 10/31/23 1721    Family Communication: Spouse-Candy-[phone] updated over the phone 4/26   Disposition Plan: Status is: Inpatient Remains inpatient appropriate because: Severity of illness   Planned Discharge Destination: CIR being contemplated   Diet: Diet Order             Diet heart healthy/carb modified Room service appropriate? Yes; Fluid consistency: Thin; Fluid restriction: 1800 mL Fluid  Diet effective now                     Antimicrobial agents: Anti-infectives (From admission, onward)    Start     Dose/Rate Route Frequency Ordered Stop   11/01/23 0200  doxycycline  (VIBRAMYCIN ) 100 mg in sodium chloride  0.9 % 250 mL IVPB  Status:  Discontinued        100 mg 125 mL/hr over 120 Minutes Intravenous Every 12 hours 10/31/23 1722 10/31/23 1751   11/01/23 0008  vancomycin  (VANCOREADY) IVPB 750 mg/150 mL        750 mg 150 mL/hr over 60 Minutes Intravenous Every 12 hours 10/31/23 1814     10/31/23 2300  metroNIDAZOLE  (FLAGYL ) IVPB 500 mg        500 mg 100 mL/hr over 60 Minutes Intravenous Every 12 hours 10/31/23 1722     10/31/23 2000  cefTRIAXone  (ROCEPHIN ) 2 g in sodium chloride  0.9 % 100 mL IVPB  Status:  Discontinued        2 g 200 mL/hr over 30 Minutes Intravenous Every 24 hours 10/31/23 1722 10/31/23 1751   10/31/23 1915  Vancomycin   (VANCOCIN ) 1,250 mg in sodium chloride  0.9 % 250 mL IVPB        1,250 mg 166.7 mL/hr over 90 Minutes Intravenous  Once 10/31/23 1817 11/01/23 0230   10/31/23 1900  ceFEPIme  (MAXIPIME ) 2 g in sodium chloride  0.9 % 100 mL IVPB        2 g 200 mL/hr over 30 Minutes Intravenous Every 8 hours 10/31/23 1810     10/31/23 1900  vancomycin  (VANCOREADY) IVPB 1250 mg/250 mL  Status:  Discontinued        1,250 mg 166.7 mL/hr over 90 Minutes Intravenous  Once 10/31/23 1813 10/31/23 1817        MEDICATIONS: Scheduled Meds:  allopurinol   100 mg Oral Daily   aspirin   81 mg Oral Daily   dextromethorphan -guaiFENesin   1 tablet Oral BID   enoxaparin  (LOVENOX ) injection  40 mg Subcutaneous Q24H   folic acid   1 mg Oral Daily   insulin  aspart  0-15 Units Subcutaneous TID WC   levETIRAcetam   1,500 mg Oral BID   multivitamin with minerals  1 tablet Oral Daily   pantoprazole   40 mg Oral QPM   potassium chloride   40 mEq Oral Once   sertraline   100 mg Oral Daily   sodium chloride  flush  3 mL Intravenous Q12H   thiamine   500 mg Oral Daily   Or   thiamine   500 mg Intravenous Daily   Continuous Infusions:  ceFEPime  (MAXIPIME ) IV 2 g (11/01/23 1815)   metronidazole  500 mg (11/01/23 2329)   vancomycin  750 mg (11/02/23 0625)   PRN Meds:.acetaminophen  **OR** acetaminophen , bisacodyl , hydrALAZINE , HYDROmorphone  (DILAUDID ) injection, LORazepam  **OR** LORazepam , ondansetron  **OR** ondansetron  (ZOFRAN ) IV, oxyCODONE , senna-docusate   I have personally reviewed following labs and imaging studies  LABORATORY DATA: CBC: Recent Labs  Lab 10/28/23 1114 10/29/23 0518 10/30/23 0018 10/30/23 0509 10/31/23 0338 11/01/23 0517 11/02/23 0333  WBC 19.5*   < > 12.3* 10.8* 8.0 10.6* 10.8*  NEUTROABS 17.3*  --   --  9.1* 6.1 8.1* 8.2*  HGB 16.1   < > 13.5 13.1 12.5* 12.8* 12.7*  HCT 43.8   < > 37.5* 36.1* 34.5* 35.9* 35.6*  MCV 92.2   < > 94.5 93.3 94.8 95.7 94.7  PLT 141*   < > 93* 84* 83* 91* 101*   < > =  values in this interval not displayed.    Basic Metabolic Panel: Recent Labs  Lab 10/29/23 0518 10/30/23 0509 10/31/23 0338 11/01/23 0517 11/02/23 0333  NA 136 129* 136 133* 133*  K 4.0 3.2* 3.1* 4.2 3.4*  CL 100 101 106 106 105  CO2 21* 22 20* 20* 20*  GLUCOSE 140* 172* 116* 166* 174*  BUN 23 15 14 13 11   CREATININE 0.61 0.57* 0.48* 0.72 0.63  CALCIUM 7.8* 7.9* 8.0* 7.9* 8.1*  MG  --  1.8  --   --  1.7  PHOS  --  1.2*  --   --  1.7*    GFR: Estimated Creatinine Clearance: 74.2 mL/min (by C-G formula based on SCr of 0.63 mg/dL).  Liver Function Tests: Recent Labs  Lab 10/28/23 1114 10/29/23 0518 10/30/23 0509 11/01/23 0517  AST 132* 181* 139* 37  ALT 47* 63* 79* 51*  ALKPHOS 178* 130* 122 99  BILITOT 3.7* 3.0* 3.2* 1.9*  PROT 6.3* 5.6* 5.4* 4.9*  ALBUMIN  3.2* 2.7* 2.7* 2.3*   No results for input(s): "LIPASE", "AMYLASE" in the last 168 hours. Recent Labs  Lab 10/28/23 1114 11/02/23 0333  AMMONIA 27 36*    Coagulation Profile: Recent Labs  Lab 10/28/23 1114 10/30/23 0018  INR 1.4* 1.6*    Cardiac Enzymes: Recent Labs  Lab 10/28/23 1114 10/29/23 0518 10/29/23 1529  CKTOTAL 2,356* 1,847* 1,574*    BNP (last 3 results) No results for input(s): "PROBNP" in the last 8760 hours.  Lipid Profile: Recent Labs    10/31/23 0338 11/02/23 0333  CHOL 46 39  HDL 24* 17*  LDLCALC 15 15  TRIG 35 37  CHOLHDL 1.9 2.3    Thyroid  Function Tests: Recent Labs    11/02/23 0333  TSH 4.762*    Anemia Panel: Recent Labs    11/02/23 0333  VITAMINB12 1,331*  FOLATE 32.9    Urine analysis:    Component Value Date/Time   COLORURINE AMBER (A) 10/28/2023 1114   APPEARANCEUR CLEAR (A) 10/28/2023 1114   LABSPEC 1.023 10/28/2023 1114   PHURINE 5.0 10/28/2023 1114   GLUCOSEU NEGATIVE 10/28/2023 1114   HGBUR NEGATIVE 10/28/2023 1114   BILIRUBINUR NEGATIVE 10/28/2023 1114   KETONESUR 20 (A) 10/28/2023 1114   PROTEINUR 100 (A) 10/28/2023 1114    NITRITE NEGATIVE 10/28/2023 1114  LEUKOCYTESUR NEGATIVE 10/28/2023 1114    Sepsis Labs: Lactic Acid, Venous    Component Value Date/Time   LATICACIDVEN 1.0 10/30/2023 1912    MICROBIOLOGY: Recent Results (from the past 240 hours)  Blood Culture (routine x 2)     Status: None   Collection Time: 10/28/23 11:14 AM   Specimen: BLOOD  Result Value Ref Range Status   Specimen Description BLOOD LEFT ARM  Final   Special Requests   Final    BOTTLES DRAWN AEROBIC AND ANAEROBIC Blood Culture results may not be optimal due to an inadequate volume of blood received in culture bottles   Culture   Final    NO GROWTH 5 DAYS Performed at [facility], [address] [place], [place] [address]    Report Status 11/02/2023 FINAL  Final  Blood Culture (routine x 2)     Status: Abnormal   Collection Time: 10/28/23 11:14 AM   Specimen: BLOOD  Result Value Ref Range Status   Specimen Description   Final    BLOOD RIGHT ARM Performed at [facility], [address], [place], [place] [address]    Special Requests   Final    BOTTLES DRAWN AEROBIC AND ANAEROBIC Blood Culture results may not be optimal due to an inadequate volume of blood received in culture bottles Performed at [facility], [address] [place], [place] [address]    Culture  Setup Time   Final    GRAM POSITIVE COCCI AEROBIC BOTTLE ONLY CRITICAL RESULT CALLED TO, READ BACK BY AND VERIFIED WITH: WILL Alva [facility] 9562 10/29/23 HNM    Culture (A)  Final    STAPHYLOCOCCUS EPIDERMIDIS THE SIGNIFICANCE OF ISOLATING THIS ORGANISM FROM A SINGLE SET OF BLOOD CULTURES WHEN MULTIPLE SETS ARE DRAWN IS UNCERTAIN. PLEASE NOTIFY THE MICROBIOLOGY DEPARTMENT WITHIN ONE WEEK IF SPECIATION AND SENSITIVITIES ARE REQUIRED. Performed at [facility], [address], [place], [place] [address]    Report Status 10/31/2023 FINAL  Final  Resp panel by RT-PCR (RSV, Flu A&B, Covid) Urine, Catheterized     Status:  None   Collection Time: 10/28/23 11:14 AM   Specimen: Urine, Catheterized; Nasal Swab  Result Value Ref Range Status   SARS Coronavirus 2 by RT PCR NEGATIVE NEGATIVE Final    Comment: (NOTE) SARS-CoV-2 target nucleic acids are NOT DETECTED.  The SARS-CoV-2 RNA is generally detectable in upper respiratory specimens during the acute phase of infection. The lowest concentration of SARS-CoV-2 viral copies this assay can detect is 138 copies/mL. A negative result does not preclude SARS-Cov-2 infection and should not be used as the sole basis for treatment or other patient management decisions. A negative result may occur with  improper specimen collection/handling, submission of specimen other than nasopharyngeal swab, presence of viral mutation(s) within the areas targeted by this assay, and inadequate number of viral copies(<138 copies/mL). A negative result must be combined with clinical observations, patient history, and epidemiological information. The expected result is Negative.  Fact Sheet for Patients:  BloggerCourse.com  Fact Sheet for Healthcare Providers:  SeriousBroker.it  This test is no t yet approved or cleared by the [place]  FDA and  has been authorized for detection and/or diagnosis of SARS-CoV-2 by FDA under an Emergency Use Authorization (EUA). This EUA will remain  in effect (meaning this test can be used) for the duration of the COVID-19 declaration under Section 564(b)(1) of the Act, 21 U.S.C.section 360bbb-3(b)(1), unless the authorization is terminated  or revoked sooner.  Influenza A by PCR NEGATIVE NEGATIVE Final   Influenza B by PCR NEGATIVE NEGATIVE Final    Comment: (NOTE) The Xpert Xpress SARS-CoV-2/FLU/RSV plus assay is intended as an aid in the diagnosis of influenza from Nasopharyngeal swab specimens and should not be used as a sole basis for treatment. Nasal washings and aspirates are  unacceptable for Xpert Xpress SARS-CoV-2/FLU/RSV testing.  Fact Sheet for Patients: BloggerCourse.com  Fact Sheet for Healthcare Providers: SeriousBroker.it  This test is not yet approved or cleared by the [place]  FDA and has been authorized for detection and/or diagnosis of SARS-CoV-2 by FDA under an Emergency Use Authorization (EUA). This EUA will remain in effect (meaning this test can be used) for the duration of the COVID-19 declaration under Section 564(b)(1) of the Act, 21 U.S.C. section 360bbb-3(b)(1), unless the authorization is terminated or revoked.     Resp Syncytial Virus by PCR NEGATIVE NEGATIVE Final    Comment: (NOTE) Fact Sheet for Patients: BloggerCourse.com  Fact Sheet for Healthcare Providers: SeriousBroker.it  This test is not yet approved or cleared by the [place]  FDA and has been authorized for detection and/or diagnosis of SARS-CoV-2 by FDA under an Emergency Use Authorization (EUA). This EUA will remain in effect (meaning this test can be used) for the duration of the COVID-19 declaration under Section 564(b)(1) of the Act, 21 U.S.C. section 360bbb-3(b)(1), unless the authorization is terminated or revoked.  Performed at [facility], [address] [place], [place] [address]   Blood Culture ID Panel (Reflexed)     Status: Abnormal   Collection Time: 10/28/23 11:14 AM  Result Value Ref Range Status   Enterococcus faecalis NOT DETECTED NOT DETECTED Final   Enterococcus Faecium NOT DETECTED NOT DETECTED Final   Listeria monocytogenes NOT DETECTED NOT DETECTED Final   Staphylococcus species DETECTED (A) NOT DETECTED Final    Comment: CRITICAL RESULT CALLED TO, READ BACK BY AND VERIFIED WITH: Lafrances Pigeon [facility] 4098 10/29/23 HNM    Staphylococcus aureus (BCID) NOT DETECTED NOT DETECTED Final   Staphylococcus epidermidis DETECTED  (A) NOT DETECTED Final    Comment: CRITICAL RESULT CALLED TO, READ BACK BY AND VERIFIED WITH: Lafrances Pigeon [facility] 1191 10/29/23 HNM    Staphylococcus lugdunensis NOT DETECTED NOT DETECTED Final   Streptococcus species NOT DETECTED NOT DETECTED Final   Streptococcus agalactiae NOT DETECTED NOT DETECTED Final   Streptococcus pneumoniae NOT DETECTED NOT DETECTED Final   Streptococcus pyogenes NOT DETECTED NOT DETECTED Final   A.calcoaceticus-baumannii NOT DETECTED NOT DETECTED Final   Bacteroides fragilis NOT DETECTED NOT DETECTED Final   Enterobacterales NOT DETECTED NOT DETECTED Final   Enterobacter cloacae complex NOT DETECTED NOT DETECTED Final   Escherichia coli NOT DETECTED NOT DETECTED Final   Klebsiella aerogenes NOT DETECTED NOT DETECTED Final   Klebsiella oxytoca NOT DETECTED NOT DETECTED Final   Klebsiella pneumoniae NOT DETECTED NOT DETECTED Final   Proteus species NOT DETECTED NOT DETECTED Final   Salmonella species NOT DETECTED NOT DETECTED Final   Serratia marcescens NOT DETECTED NOT DETECTED Final   Haemophilus influenzae NOT DETECTED NOT DETECTED Final   Neisseria meningitidis NOT DETECTED NOT DETECTED Final   Pseudomonas aeruginosa NOT DETECTED NOT DETECTED Final   Stenotrophomonas maltophilia NOT DETECTED NOT DETECTED Final   Candida albicans NOT DETECTED NOT DETECTED Final   Candida auris NOT DETECTED NOT DETECTED Final   Candida glabrata NOT DETECTED NOT DETECTED Final   Candida krusei NOT DETECTED NOT DETECTED Final   Candida parapsilosis NOT DETECTED  NOT DETECTED Final   Candida tropicalis NOT DETECTED NOT DETECTED Final   Cryptococcus neoformans/gattii NOT DETECTED NOT DETECTED Final   Methicillin resistance mecA/C NOT DETECTED NOT DETECTED Final    Comment: Performed at [facility], [address] [place], [place] [address]  C Difficile Quick Screen w PCR reflex     Status: None   Collection Time: 10/29/23  5:42 PM   Specimen: STOOL  Result Value  Ref Range Status   C Diff antigen NEGATIVE NEGATIVE Final   C Diff toxin NEGATIVE NEGATIVE Final   C Diff interpretation No C. difficile detected.  Final    Comment: Performed at [facility], [address] [place], [place] [address]  Gastrointestinal Panel by PCR , Stool     Status: None   Collection Time: 10/29/23  5:42 PM   Specimen: STOOL  Result Value Ref Range Status   Campylobacter species NOT DETECTED NOT DETECTED Final   Plesimonas shigelloides NOT DETECTED NOT DETECTED Final   Salmonella species NOT DETECTED NOT DETECTED Final   Yersinia enterocolitica NOT DETECTED NOT DETECTED Final   Vibrio species NOT DETECTED NOT DETECTED Final   Vibrio cholerae NOT DETECTED NOT DETECTED Final   Enteroaggregative E coli (EAEC) NOT DETECTED NOT DETECTED Final   Enteropathogenic E coli (EPEC) NOT DETECTED NOT DETECTED Final   Enterotoxigenic E coli (ETEC) NOT DETECTED NOT DETECTED Final   Shiga like toxin producing E coli (STEC) NOT DETECTED NOT DETECTED Final   Shigella/Enteroinvasive E coli (EIEC) NOT DETECTED NOT DETECTED Final   Cryptosporidium NOT DETECTED NOT DETECTED Final   Cyclospora cayetanensis NOT DETECTED NOT DETECTED Final   Entamoeba histolytica NOT DETECTED NOT DETECTED Final   Giardia lamblia NOT DETECTED NOT DETECTED Final   Adenovirus F40/41 NOT DETECTED NOT DETECTED Final   Astrovirus NOT DETECTED NOT DETECTED Final   Norovirus GI/GII NOT DETECTED NOT DETECTED Final   Rotavirus A NOT DETECTED NOT DETECTED Final   Sapovirus (I, II, IV, and V) NOT DETECTED NOT DETECTED Final    Comment: Performed at [facility], [address] [place], [place] [address]  Culture, blood (single) w Reflex to ID Panel     Status: None (Preliminary result)   Collection Time: 10/29/23  5:55 PM   Specimen: BLOOD  Result Value Ref Range Status   Specimen Description BLOOD LEFT ANTECUBITAL  Final   Special Requests   Final    BOTTLES DRAWN AEROBIC AND ANAEROBIC Blood  Culture adequate volume   Culture   Final    NO GROWTH 4 DAYS Performed at [facility], [address] [place], [place] [address]    Report Status PENDING  Incomplete  Respiratory (~20 pathogens) panel by PCR     Status: None   Collection Time: 10/31/23  5:50 PM   Specimen: Nasopharyngeal Swab; Respiratory  Result Value Ref Range Status   Adenovirus NOT DETECTED NOT DETECTED Final   Coronavirus 229E NOT DETECTED NOT DETECTED Final    Comment: (NOTE) The Coronavirus on the Respiratory Panel, DOES NOT test for the novel  Coronavirus (2019 nCoV)    Coronavirus HKU1 NOT DETECTED NOT DETECTED Final   Coronavirus NL63 NOT DETECTED NOT DETECTED Final   Coronavirus OC43 NOT DETECTED NOT DETECTED Final   Metapneumovirus NOT DETECTED NOT DETECTED Final   Rhinovirus / Enterovirus NOT DETECTED NOT DETECTED Final   Influenza A NOT DETECTED NOT DETECTED Final   Influenza B NOT DETECTED NOT DETECTED Final   Parainfluenza  Virus 1 NOT DETECTED NOT DETECTED Final   Parainfluenza Virus 2 NOT DETECTED NOT DETECTED Final   Parainfluenza Virus 3 NOT DETECTED NOT DETECTED Final   Parainfluenza Virus 4 NOT DETECTED NOT DETECTED Final   Respiratory Syncytial Virus NOT DETECTED NOT DETECTED Final   Bordetella pertussis NOT DETECTED NOT DETECTED Final   Bordetella Parapertussis NOT DETECTED NOT DETECTED Final   Chlamydophila pneumoniae NOT DETECTED NOT DETECTED Final   Mycoplasma pneumoniae NOT DETECTED NOT DETECTED Final    Comment: Performed at [facility], [address], [place], [place] [address]  Culture, blood (Routine X 2) w Reflex to ID Panel     Status: None (Preliminary result)   Collection Time: 10/31/23  7:44 PM   Specimen: BLOOD RIGHT ARM  Result Value Ref Range Status   Specimen Description BLOOD RIGHT ARM  Final   Special Requests   Final    BOTTLES DRAWN AEROBIC AND ANAEROBIC Blood Culture adequate volume   Culture   Final    NO GROWTH 2 DAYS Performed at [facility], [address], [place], [place] [address]    Report Status PENDING  Incomplete  C Difficile Quick Screen w PCR reflex     Status: None   Collection Time: 11/01/23 12:36 AM   Specimen: STOOL  Result Value Ref Range Status   C Diff antigen NEGATIVE NEGATIVE Final   C Diff toxin NEGATIVE NEGATIVE Final   C Diff interpretation No C. difficile detected.  Final    Comment: Performed at [facility], [address] [place], [place] [address]  Gastrointestinal Panel by PCR , Stool     Status: None   Collection Time: 11/01/23 12:36 AM   Specimen: STOOL  Result Value Ref Range Status   Campylobacter species NOT DETECTED NOT DETECTED Final   Plesimonas shigelloides NOT DETECTED NOT DETECTED Final   Salmonella species NOT DETECTED NOT DETECTED Final   Yersinia enterocolitica NOT DETECTED NOT DETECTED Final   Vibrio species NOT DETECTED NOT DETECTED Final   Vibrio cholerae NOT DETECTED NOT DETECTED Final   Enteroaggregative E coli (EAEC) NOT DETECTED NOT DETECTED Final   Enteropathogenic E coli (EPEC) NOT DETECTED NOT DETECTED Final   Enterotoxigenic E coli (ETEC) NOT DETECTED NOT DETECTED Final   Shiga like toxin producing E coli (STEC) NOT DETECTED NOT DETECTED Final   Shigella/Enteroinvasive E coli (EIEC) NOT DETECTED NOT DETECTED Final   Cryptosporidium NOT DETECTED NOT DETECTED Final   Cyclospora cayetanensis NOT DETECTED NOT DETECTED Final   Entamoeba histolytica NOT DETECTED NOT DETECTED Final   Giardia lamblia NOT DETECTED NOT DETECTED Final   Adenovirus F40/41 NOT DETECTED NOT DETECTED Final   Astrovirus NOT DETECTED NOT DETECTED Final   Norovirus GI/GII NOT DETECTED NOT DETECTED Final   Rotavirus A NOT DETECTED NOT DETECTED Final   Sapovirus (I, II, IV, and V) NOT DETECTED NOT DETECTED Final    Comment: Performed at [facility], [address], [place], [place] [address]    RADIOLOGY STUDIES/RESULTS: ECHOCARDIOGRAM COMPLETE Result Date: 11/01/2023     ECHOCARDIOGRAM REPORT   Patient Name:   Reiss Granade Date of Exam: 11/01/2023 Medical Rec #:  [id number]  Height:       66.0 in Accession #:    [id number] Weight:       140.0 lb Date of Birth:  1950/12/18  BSA:          1.718 m Patient Age:    55  years   BP:           105/67 mmHg Patient Gender: M          HR:           85 bpm. Exam Location:  Inpatient Procedure: 2D Echo, Cardiac Doppler, Color Doppler and Intracardiac            Opacification Agent (Both Spectral and Color Flow Doppler were            utilized during procedure). Indications:     Stroke  History:         Patient has no prior history of Echocardiogram examinations.                  Stroke; Risk Factors:Hypertension and Diabetes.  Sonographer:     [name] Shaw Referring Phys:  MCNEILL Annah Barre Diagnosing Phys: Archer Bear  Sonographer Comments: Image acquisition challenging due to patient body habitus and Image acquisition challenging due to respiratory motion. IMPRESSIONS  1. Left ventricular ejection fraction, by estimation, is 55 to 60%. The left ventricle has normal function. Left ventricular endocardial border not optimally defined to evaluate regional wall motion. Left ventricular diastolic parameters are consistent with Grade I diastolic dysfunction (impaired relaxation).  2. Right ventricular systolic function is normal. The right ventricular size is normal.  3. The mitral valve is degenerative. Trivial mitral valve regurgitation. No evidence of mitral stenosis. The mean mitral valve gradient is 2.0 mmHg. Moderate mitral annular calcification.  4. The aortic valve is tricuspid. There is severe calcifcation of the aortic valve. Aortic valve regurgitation is not visualized. Mild aortic valve stenosis. Aortic valve mean gradient measures 13.0 mmHg.  5. The inferior vena cava is normal in size with greater than 50% respiratory variability, suggesting right atrial pressure of 3 mmHg.  6. Technically difficult study with poor acoustic windows.  FINDINGS  Left Ventricle: Left ventricular ejection fraction, by estimation, is 55 to 60%. The left ventricle has normal function. Left ventricular endocardial border not optimally defined to evaluate regional wall motion. The left ventricular internal cavity size was normal in size. There is no left ventricular hypertrophy. Left ventricular diastolic parameters are consistent with Grade I diastolic dysfunction (impaired relaxation). Right Ventricle: The right ventricular size is normal. No increase in right ventricular wall thickness. Right ventricular systolic function is normal. Left Atrium: Left atrial size was normal in size. Right Atrium: Right atrial size was normal in size. Pericardium: There is no evidence of pericardial effusion. Mitral Valve: The mitral valve is degenerative in appearance. There is mild calcification of the mitral valve leaflet(s). Moderate mitral annular calcification. Trivial mitral valve regurgitation. No evidence of mitral valve stenosis. MV peak gradient, 4.8 mmHg. The mean mitral valve gradient is 2.0 mmHg. Tricuspid Valve: The tricuspid valve is normal in structure. Tricuspid valve regurgitation is trivial. Aortic Valve: The aortic valve is tricuspid. There is severe calcifcation of the aortic valve. Aortic valve regurgitation is not visualized. Mild aortic stenosis is present. Aortic valve mean gradient measures 13.0 mmHg. Aortic valve peak gradient measures 26.0 mmHg. Aortic valve area, by VTI measures 2.41 cm. Pulmonic Valve: The pulmonic valve was normal in structure. Pulmonic valve regurgitation is not visualized. Aorta: The aortic root is normal in size and structure. Venous: The inferior vena cava is normal in size with greater than 50% respiratory variability, suggesting right atrial pressure of 3 mmHg. IAS/Shunts: No atrial level shunt detected by color flow Doppler.  LEFT VENTRICLE PLAX 2D LVIDd:  4.00 cm      Diastology LVIDs:         2.50 cm      LV e' medial:     4.57 cm/s LV PW:         1.00 cm      LV E/e' medial:  15.3 LV IVS:        0.80 cm      LV e' lateral:   4.68 cm/s LVOT diam:     2.10 cm      LV E/e' lateral: 15.0 LV SV:         75 LV SV Index:   44 LVOT Area:     3.46 cm  LV Volumes (MOD) LV vol d, MOD A4C: 106.0 ml LV vol s, MOD A4C: 27.0 ml LV SV MOD A4C:     106.0 ml RIGHT VENTRICLE             IVC RV Basal diam:  3.40 cm     IVC diam: 1.00 cm RV Mid diam:    2.40 cm RV S prime:     13.30 cm/s LEFT ATRIUM           Index        RIGHT ATRIUM          Index LA diam:      4.00 cm 2.33 cm/m   RA Area:     7.43 cm LA Vol (A4C): 37.3 ml 21.71 ml/m  RA Volume:   11.40 ml 6.63 ml/m  AORTIC VALVE                     PULMONIC VALVE AV Area (Vmax):    1.79 cm      PV Vmax:       1.50 m/s AV Area (Vmean):   2.04 cm      PV Peak grad:  8.9 mmHg AV Area (VTI):     2.41 cm AV Vmax:           254.80 cm/s AV Vmean:          151.000 cm/s AV VTI:            0.310 m AV Peak Grad:      26.0 mmHg AV Mean Grad:      13.0 mmHg LVOT Vmax:         132.00 cm/s LVOT Vmean:        88.800 cm/s LVOT VTI:          0.216 m LVOT/AV VTI ratio: 0.70  AORTA Ao Root diam: 3.20 cm Ao Asc diam:  3.60 cm MITRAL VALVE MV Area (PHT): 2.40 cm     SHUNTS MV Area VTI:   3.87 cm     Systemic VTI:  0.22 m MV Peak grad:  4.8 mmHg     Systemic Diam: 2.10 cm MV Mean grad:  2.0 mmHg MV Vmax:       1.10 m/s MV Vmean:      65.5 cm/s MV Decel Time: 316 msec MV E velocity: 70.00 cm/s MV A velocity: 124.00 cm/s MV E/A ratio:  0.56 Dalton McleanMD Electronically signed by Archer Bear Signature Date/Time: 11/01/2023/9:38:22 AM    Final (Updated)    Overnight EEG with video Result Date: 11/01/2023 Arleene Lack, MD     11/02/2023  4:31 AM Patient Name: Brahm Horman MRN: [id number] Epilepsy Attending: Arleene Lack Referring Physician/Provider: Eleni Griffin, MD Duration: 10/31/2023 2105 to 11/02/2023  1914 Patient history: 63yo M with intermittent confusion. EEG to evaluate for seizure Level of  alertness: Awake, asleep AEDs during EEG study: LEV Technical aspects: This EEG study was done with scalp electrodes positioned according to the 10-20 International system of electrode placement. Electrical activity was reviewed with band pass filter of 1-70Hz , sensitivity of 7 uV/mm, display speed of 37mm/sec with a 60Hz  notched filter applied as appropriate. EEG data were recorded continuously and digitally stored.  Video monitoring was available and reviewed as appropriate. Description: The posterior dominant rhythm consists of 9-10 Hz activity of moderate voltage (25-35 uV) seen predominantly in posterior head regions, symmetric and reactive to eye opening and eye closing. There is intermittent generalized 3-5hz  theta-delta slowing admixed with 13 to 15 Hz beta activity distributed symmetrically and diffusely.  Hyperventilation and photic stimulation were not performed.   EEG was not interpretable between 0325 to 1034 due to significant electrode artifact ABNORMALITY - Intermittent slow, generalized IMPRESSION: This study is suggestive of mild diffuse encephalopathy. No seizures or epileptiform discharges were seen throughout the recording. Arleene Lack   DG CHEST PORT 1 VIEW Result Date: 10/31/2023 CLINICAL DATA:  [id number] Dyspnea [id number] EXAM: PORTABLE CHEST 1 VIEW COMPARISON:  Chest x-ray 10/29/2023, CT angio head and neck 10/30/2023 FINDINGS: The heart and mediastinal contours are unchanged. Slightly worsened interstitial and airspace opacities most prominent along the right upper lung zone. Left base atelectasis. No pleural effusion. No pneumothorax. No acute osseous abnormality. Right upper quadrant surgical clips. IMPRESSION: Slightly worsened interstitial and airspace opacities most prominent along the right upper lung zone. Findings could represent a combination of infectious and/or edema. Electronically Signed   By: Morgane  Naveau M.D.   On: 10/31/2023 19:45     LOS: 2 days   Kimberly Penna,  MD  [facility]    To contact the attending provider between 7A-7P or the covering provider during after hours 7P-7A, please log into the web site [email or web address] and access using universal [place] password for that web site. If you do not have the password, please call the hospital operator.  11/02/2023, 9:39 AM

## 2023-11-02 NOTE — Progress Notes (Addendum)
 Pharmacy Antibiotic Note  [name] is a 63 y.o. male admitted to [facility] on 10/28/23 and transferred to [facility] today.  Pharmacy has been to switch from Vancomycin  and Cefepime  dosing to Unasyn for aspiration pneumonia.   WBC down from 19k to 10.8k today, patient has a temperature of 100.2 4/25 around 7pm but afebrile yet today.   Plan: START Unasyn 3g q6 hours  Stop Cefepime   Stop Vancomycin   Stop flagyl   Follow renal fxn, culture data, progress, abx plans   Height: 5\' 6"  (167.6 cm) Weight: 65.3 kg (143 lb 15.4 oz) IBW/kg (Calculated) : 63.8  Temp (24hrs), Avg:99 F (37.2 C), Min:98.2 F (36.8 C), Max:100.2 F (37.9 C)  Recent Labs  Lab 10/28/23 1549 10/29/23 0518 10/29/23 1529 10/30/23 0018 10/30/23 0509 10/30/23 1618 10/30/23 1912 10/31/23 0338 11/01/23 0517 11/02/23 0333  WBC  --  13.1*  --  12.3* 10.8*  --   --  8.0 10.6* 10.8*  CREATININE  --  0.61  --   --  0.57*  --   --  0.48* 0.72 0.63  LATICACIDVEN 2.0*  --  2.4*  --  1.3 1.3 1.0  --   --   --     Estimated Creatinine Clearance: 74.2 mL/min (by C-G formula based on SCr of 0.63 mg/dL).    Allergies  Allergen Reactions   Hydrochlorothiazide Other (See Comments)    Hyponatremia - High sodium in the blood    Antimicrobials this admission: Ceftriaxone  4/21 >>4/24 (last dose 4/23 pm) Doxycycline  IV 4/22>>4/24 am Vancomycin  4/21 x1; resuming at [facility] 4/24 >4/26 Cefepime  4/21 x1; resuming at [facility] 4/24 >4/26 Metronidazole  IV 4/21 >>4/22; resumed 4/24 >4/26 NEW Unasyn 3g q6 hours 4/26 >>    Dose adjustments this admission: n/a  Microbiology results: 4/21 blood: Staph epi, BCID MSSE 4/21 blood: no growth x 3 days to date 4/22 blood: no growth x 2 days to date 4/22 GI panel: negative 4/22 C diff: negative 4/21 COVID, flu and RSV: neg 4/24 blood: sent 4/24 resp panel: sent  Thank you for allowing pharmacy to be a part of this patient's care.  Leonora Ramus, PharmD, BCPS PGY2 Pharmacy Resident

## 2023-11-02 NOTE — Progress Notes (Signed)
LTM maint complete - no skin breakdown under: F8 , A2

## 2023-11-02 NOTE — Progress Notes (Signed)
 Inpatient Rehab Admissions Coordinator:    I met with pt. To discuss potential CIR admit. He states he wants to just go home, though note he is confused with telesitter currently. I will call and speak with his family as well.  Wandalee Gust, MS, CCC-SLP Rehab Admissions Coordinator  [phone] (celll) [phone] (office)

## 2023-11-03 ENCOUNTER — Inpatient Hospital Stay (HOSPITAL_COMMUNITY)

## 2023-11-03 DIAGNOSIS — G9340 Encephalopathy, unspecified: Secondary | ICD-10-CM | POA: Diagnosis not present

## 2023-11-03 DIAGNOSIS — R569 Unspecified convulsions: Secondary | ICD-10-CM | POA: Diagnosis not present

## 2023-11-03 DIAGNOSIS — I10 Essential (primary) hypertension: Secondary | ICD-10-CM | POA: Diagnosis not present

## 2023-11-03 DIAGNOSIS — F1090 Alcohol use, unspecified, uncomplicated: Secondary | ICD-10-CM | POA: Diagnosis not present

## 2023-11-03 DIAGNOSIS — K219 Gastro-esophageal reflux disease without esophagitis: Secondary | ICD-10-CM | POA: Diagnosis not present

## 2023-11-03 LAB — T4, FREE: Free T4: 1.41 ng/dL — ABNORMAL HIGH (ref 0.61–1.12)

## 2023-11-03 LAB — PHOSPHORUS: Phosphorus: 2.1 mg/dL — ABNORMAL LOW (ref 2.5–4.6)

## 2023-11-03 LAB — BASIC METABOLIC PANEL WITH GFR
Anion gap: 5 (ref 5–15)
BUN: 8 mg/dL (ref 8–23)
CO2: 19 mmol/L — ABNORMAL LOW (ref 22–32)
Calcium: 7.9 mg/dL — ABNORMAL LOW (ref 8.9–10.3)
Chloride: 107 mmol/L (ref 98–111)
Creatinine, Ser: 0.51 mg/dL — ABNORMAL LOW (ref 0.61–1.24)
GFR, Estimated: >60 mL/min (ref >60)
Glucose, Bld: 186 mg/dL — ABNORMAL HIGH (ref 70–99)
Potassium: 3.8 mmol/L (ref 3.5–5.1)
Sodium: 131 mmol/L — ABNORMAL LOW (ref 135–145)

## 2023-11-03 LAB — GLUCOSE, CAPILLARY
Glucose-Capillary: 190 mg/dL — ABNORMAL HIGH (ref 70–99)
Glucose-Capillary: 163 mg/dL — ABNORMAL HIGH (ref 70–99)
Glucose-Capillary: 140 mg/dL — ABNORMAL HIGH (ref 70–99)
Glucose-Capillary: 201 mg/dL — ABNORMAL HIGH (ref 70–99)

## 2023-11-03 LAB — CULTURE, BLOOD (SINGLE)
Culture: NO GROWTH
Special Requests: ADEQUATE

## 2023-11-03 LAB — MAGNESIUM: Magnesium: 1.8 mg/dL (ref 1.7–2.4)

## 2023-11-03 MED ORDER — POTASSIUM PHOSPHATES 15 MMOLE/5ML IV SOLN
30.0000 mmol | Freq: Once | INTRAVENOUS | Status: AC
  Administered 2023-11-03: 30 mmol via INTRAVENOUS
  Filled 2023-11-03: qty 10

## 2023-11-03 NOTE — Plan of Care (Signed)
 Neurology plan of care  Please see neurology follow-up note from yesterday 11/02/23 for full findings and recommendations.   No epileptiform activity overnight. cEEG discontinued.   Final recommendations: - Continue keppra  500mg  bid at hospital discharge - Per [place] law no driving x6 mos after last seizure - Ambulatory cardiac monitoring at hospital discharge to eval for possible occult a fib - ASA 81mg  daily for secondary stroke prevention - I will arrange outpatient neuro f/u  Neurology will not continue to actively follow, but please re-engage if additional neurologic concerns arise.  Greg Leaks, MD [facility] [phone]  If 7pm- 7am, please page neurology on call as listed in AMION.

## 2023-11-03 NOTE — Progress Notes (Signed)
 PROGRESS NOTE        PATIENT DETAILS Name: [name] Age: 63 y.o. Sex: male Date of Birth: December 19, 1950 Admit Date: 10/31/2023 Admitting Physician Johnetta Nab, MD ZOX:[id number], Erlinda Haws, MD  Brief Summary: Patient is a 63 y.o.  male with history of EtOH use, HTN, DM-2, liver cirrhosis-who was admitted to [facility] for acute metabolic encephalopathy likely in the setting of new onset seizures-upon further evaluation-patient was also found to have aspiration pneumonia-MRI revealed acute CVA.  Patient was transferred to [facility] for LTM EEG.  Significant events: 4/24>> transferred to [facility] from [facility] for LTM EEG.  Significant studies: 4/21>> UDS: Negative 4/21>> A1c: 5.7 4/21>> CT head: No acute intracranial abnormality 4/21>> CT abdomen/pelvis: Patchy airspace opacities of both lungs, severe hepatic steatosis with contour irregularity of the liver suspicious for cirrhosis. 4/22>> MRI brain: Right cerebellar subacute infarction-no hemorrhage (addended report) 4/22>> Spot EEG: Left fronto-central seizure lasting 8 minutes. 4/23>> CT angio head/neck: No LVO or significant stenosis 4/24>> LDL: 15 4/24-4/25>> LTM EEG: No seizures. 4/25>> echo: EF 55-60%, grade 1 diastolic dysfunction. 4/26-4/27>> LTM EEG: Negative  Significant microbiology data: 4/21>> blood culture: Staph epidermidis (contamination) 4/22>> blood culture: Negative 4/24>> respiratory virus panel: Negative 4/24>> blood culture: No growth 4/25>> stool C. difficile PCR: Negative  Procedures: None  Consults: Neuro  Subjective: No major issues overnight-awake/alert.  Objective: Vitals: Blood pressure 106/69, pulse 74, temperature 98.4 F (36.9 C), temperature source Oral, resp. rate 20, height 5\' 6"  (1.676 m), weight 67.5 kg, SpO2 96%.   Exam: Gen Exam:Alert awake-not in any distress HEENT:atraumatic, normocephalic Chest: B/L clear to auscultation anteriorly CVS:S1S2 regular Abdomen:soft non  tender, non distended Extremities:no edema Neurology: Non focal Skin: no rash  Pertinent Labs/Radiology:    Latest Ref Rng & Units 11/02/2023    3:33 AM 11/01/2023    5:17 AM 10/31/2023    3:38 AM  CBC  WBC 4.0 - 10.5 K/uL 10.8  10.6  8.0   Hemoglobin 13.0 - 17.0 g/dL 40.9  81.1  91.4   Hematocrit 39.0 - 52.0 % 35.6  35.9  34.5   Platelets 150 - 400 K/uL 101  91  83     Lab Results  Component Value Date   NA 131 (L) 11/03/2023   K 3.8 11/03/2023   CL 107 11/03/2023   CO2 19 (L) 11/03/2023      Assessment/Plan: Acute metabolic encephalopathy Secondary to sepsis physiology from pneumonia and seizure LTM EEG negative  Neuroimaging as above Mental status continues to improve-relatively awake/alert this morning.  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 Neurology following.  Acute ischemic CVA No focal deficits Workup as above Etiology thought to be embolic Continue aspirin  Telemetry monitoring Neurology recommending outpatient cardiac monitor-will need to contact cardiology when closer to discharge.  Sepsis secondary to presumed aspiration pneumonia Clinically improved  Repeat cultures negative Continue Unasyn  Hypokalemia/hypophosphatemia/hypomagnesemia Related to EtOH use K/Mg normalized-continue to replete phosphorus today.  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  Alcoholic hepatitis Significant improvement in transaminitis Follow LFTs periodically  EtOH use Awake/alert No major tremors Ativan  per CIWA  protocol  GERD PPI  Chronic opiate dependence/chronic pain syndrome Continue as needed oxycodone   Mood disorder Appears stable Zoloft   BMI: Estimated body mass index is 24.02 kg/m as calculated from the following:   Height as of this encounter: 5\' 6"  (1.676 m).   Weight as of this encounter: 67.5 kg.   Code status:   Code Status: Full Code   DVT Prophylaxis: enoxaparin  (LOVENOX ) injection 40 mg Start: 10/31/23 2100 SCDs Start: 10/31/23 1721    Family Communication: Spouse-Candy-[phone] updated over the phone 4/26   Disposition Plan: Status is: Inpatient Remains inpatient appropriate because: Severity of illness   Planned Discharge Destination: CIR being contemplated   Diet: Diet Order             Diet heart healthy/carb modified Room service appropriate? Yes; Fluid consistency: Thin; Fluid restriction: 1800 mL Fluid  Diet effective now                     Antimicrobial agents: Anti-infectives (From admission, onward)    Start     Dose/Rate Route Frequency Ordered Stop   11/02/23 1700  Ampicillin-Sulbactam (UNASYN) 3 g in sodium chloride  0.9 % 100 mL IVPB        3 g 200 mL/hr over 30 Minutes Intravenous Every 6 hours 11/02/23 1019     11/01/23 0200  doxycycline  (VIBRAMYCIN ) 100 mg in sodium chloride  0.9 % 250 mL IVPB  Status:  Discontinued        100 mg 125 mL/hr over 120 Minutes Intravenous Every 12 hours 10/31/23 1722 10/31/23 1751   11/01/23 0008  vancomycin  (VANCOREADY) IVPB 750 mg/150 mL  Status:  Discontinued        750 mg 150 mL/hr over 60 Minutes Intravenous Every 12 hours 10/31/23 1814 11/02/23 0946   10/31/23 2300  metroNIDAZOLE  (FLAGYL ) IVPB 500 mg  Status:  Discontinued        500 mg 100 mL/hr over 60 Minutes Intravenous Every 12 hours 10/31/23 1722 11/02/23 1018   10/31/23 2000  cefTRIAXone  (ROCEPHIN ) 2 g in sodium chloride  0.9 % 100 mL IVPB  Status:  Discontinued        2 g 200 mL/hr over 30 Minutes Intravenous Every 24 hours  10/31/23 1722 10/31/23 1751   10/31/23 1915  Vancomycin  (VANCOCIN ) 1,250 mg in sodium chloride  0.9 % 250 mL IVPB        1,250 mg 166.7 mL/hr over 90 Minutes Intravenous  Once 10/31/23 1817 11/01/23 0230   10/31/23 1900  ceFEPIme  (MAXIPIME ) 2 g in sodium chloride  0.9 % 100 mL IVPB  Status:  Discontinued        2 g 200 mL/hr over 30 Minutes Intravenous Every 8 hours 10/31/23 1810 11/02/23 0946   10/31/23 1900  vancomycin  (VANCOREADY) IVPB 1250 mg/250 mL  Status:  Discontinued        1,250 mg 166.7 mL/hr over 90 Minutes Intravenous  Once 10/31/23 1813 10/31/23 1817        MEDICATIONS: Scheduled Meds:  allopurinol   100 mg Oral Daily   aspirin   81 mg Oral Daily   dextromethorphan -guaiFENesin   1 tablet Oral BID   enoxaparin  (LOVENOX ) injection  40 mg Subcutaneous Q24H   folic acid   1 mg Oral Daily   insulin  aspart  0-15 Units Subcutaneous TID WC   levETIRAcetam   1,500 mg Oral BID  multivitamin with minerals  1 tablet Oral Daily   pantoprazole   40 mg Oral QPM   sertraline   100 mg Oral Daily   sodium chloride  flush  3 mL Intravenous Q12H   thiamine   500 mg Oral Daily   Or   thiamine   500 mg Intravenous Daily   Continuous Infusions:  ampicillin-sulbactam (UNASYN) IV 3 g (11/03/23 0459)   PRN Meds:.acetaminophen  **OR** acetaminophen , bisacodyl , hydrALAZINE , HYDROmorphone  (DILAUDID ) injection, LORazepam  **OR** LORazepam , ondansetron  **OR** ondansetron  (ZOFRAN ) IV, oxyCODONE , senna-docusate   I have personally reviewed following labs and imaging studies  LABORATORY DATA: CBC: Recent Labs  Lab 10/28/23 1114 10/29/23 0518 10/30/23 0018 10/30/23 0509 10/31/23 0338 11/01/23 0517 11/02/23 0333  WBC 19.5*   < > 12.3* 10.8* 8.0 10.6* 10.8*  NEUTROABS 17.3*  --   --  9.1* 6.1 8.1* 8.2*  HGB 16.1   < > 13.5 13.1 12.5* 12.8* 12.7*  HCT 43.8   < > 37.5* 36.1* 34.5* 35.9* 35.6*  MCV 92.2   < > 94.5 93.3 94.8 95.7 94.7  PLT 141*   < > 93* 84* 83* 91* 101*   < > = values in this  interval not displayed.    Basic Metabolic Panel: Recent Labs  Lab 10/30/23 0509 10/31/23 0338 11/01/23 0517 11/02/23 0333 11/03/23 0555  NA 129* 136 133* 133* 131*  K 3.2* 3.1* 4.2 3.4* 3.8  CL 101 106 106 105 107  CO2 22 20* 20* 20* 19*  GLUCOSE 172* 116* 166* 174* 186*  BUN 15 14 13 11 8   CREATININE 0.57* 0.48* 0.72 0.63 0.51*  CALCIUM 7.9* 8.0* 7.9* 8.1* 7.9*  MG 1.8  --   --  1.7 1.8  PHOS 1.2*  --   --  1.7* 2.1*    GFR: Estimated Creatinine Clearance: 74.2 mL/min (A) (by C-G formula based on SCr of 0.51 mg/dL (L)).  Liver Function Tests: Recent Labs  Lab 10/28/23 1114 10/29/23 0518 10/30/23 0509 11/01/23 0517  AST 132* 181* 139* 37  ALT 47* 63* 79* 51*  ALKPHOS 178* 130* 122 99  BILITOT 3.7* 3.0* 3.2* 1.9*  PROT 6.3* 5.6* 5.4* 4.9*  ALBUMIN  3.2* 2.7* 2.7* 2.3*   No results for input(s): "LIPASE", "AMYLASE" in the last 168 hours. Recent Labs  Lab 10/28/23 1114 11/02/23 0333  AMMONIA 27 36*    Coagulation Profile: Recent Labs  Lab 10/28/23 1114 10/30/23 0018  INR 1.4* 1.6*    Cardiac Enzymes: Recent Labs  Lab 10/28/23 1114 10/29/23 0518 10/29/23 1529  CKTOTAL 2,356* 1,847* 1,574*    BNP (last 3 results) No results for input(s): "PROBNP" in the last 8760 hours.  Lipid Profile: Recent Labs    11/02/23 0333  CHOL 39  HDL 17*  LDLCALC 15  TRIG 37  CHOLHDL 2.3    Thyroid  Function Tests: Recent Labs    11/02/23 0333 11/03/23 0555  TSH 4.762*  --   FREET4  --  1.41*    Anemia Panel: Recent Labs    11/02/23 0333  VITAMINB12 1,331*  FOLATE 32.9    Urine analysis:    Component Value Date/Time   COLORURINE AMBER (A) 10/28/2023 1114   APPEARANCEUR CLEAR (A) 10/28/2023 1114   LABSPEC 1.023 10/28/2023 1114   PHURINE 5.0 10/28/2023 1114   GLUCOSEU NEGATIVE 10/28/2023 1114   HGBUR NEGATIVE 10/28/2023 1114   BILIRUBINUR NEGATIVE 10/28/2023 1114   KETONESUR 20 (A) 10/28/2023 1114   PROTEINUR 100 (A) 10/28/2023 1114    NITRITE NEGATIVE 10/28/2023 1114  LEUKOCYTESUR NEGATIVE 10/28/2023 1114    Sepsis Labs: Lactic Acid, Venous    Component Value Date/Time   LATICACIDVEN 1.0 10/30/2023 1912    MICROBIOLOGY: Recent Results (from the past 240 hours)  Blood Culture (routine x 2)     Status: None   Collection Time: 10/28/23 11:14 AM   Specimen: BLOOD  Result Value Ref Range Status   Specimen Description BLOOD LEFT ARM  Final   Special Requests   Final    BOTTLES DRAWN AEROBIC AND ANAEROBIC Blood Culture results may not be optimal due to an inadequate volume of blood received in culture bottles   Culture   Final    NO GROWTH 5 DAYS Performed at [facility], [address], [place], [place] [address]    Report Status 11/02/2023 FINAL  Final  Blood Culture (routine x 2)     Status: Abnormal   Collection Time: 10/28/23 11:14 AM   Specimen: BLOOD  Result Value Ref Range Status   Specimen Description   Final    BLOOD RIGHT ARM Performed at [facility], [address], [place], [place] [address]    Special Requests   Final    BOTTLES DRAWN AEROBIC AND ANAEROBIC Blood Culture results may not be optimal due to an inadequate volume of blood received in culture bottles Performed at [facility], [address] [place], [place] [address]    Culture  Setup Time   Final    GRAM POSITIVE COCCI AEROBIC BOTTLE ONLY CRITICAL RESULT CALLED TO, READ BACK BY AND VERIFIED WITH: WILL Alva Jewels [facility] 2130 10/29/23 HNM    Culture (A)  Final    STAPHYLOCOCCUS EPIDERMIDIS THE SIGNIFICANCE OF ISOLATING THIS ORGANISM FROM A SINGLE SET OF BLOOD CULTURES WHEN MULTIPLE SETS ARE DRAWN IS UNCERTAIN. PLEASE NOTIFY THE MICROBIOLOGY DEPARTMENT WITHIN ONE WEEK IF SPECIATION AND SENSITIVITIES ARE REQUIRED. Performed at [facility], [address], [place], [place] [address]    Report Status 10/31/2023 FINAL  Final  Resp panel by RT-PCR (RSV, Flu A&B, Covid) Urine, Catheterized     Status:  None   Collection Time: 10/28/23 11:14 AM   Specimen: Urine, Catheterized; Nasal Swab  Result Value Ref Range Status   SARS Coronavirus 2 by RT PCR NEGATIVE NEGATIVE Final    Comment: (NOTE) SARS-CoV-2 target nucleic acids are NOT DETECTED.  The SARS-CoV-2 RNA is generally detectable in upper respiratory specimens during the acute phase of infection. The lowest concentration of SARS-CoV-2 viral copies this assay can detect is 138 copies/mL. A negative result does not preclude SARS-Cov-2 infection and should not be used as the sole basis for treatment or other patient management decisions. A negative result may occur with  improper specimen collection/handling, submission of specimen other than nasopharyngeal swab, presence of viral mutation(s) within the areas targeted by this assay, and inadequate number of viral copies(<138 copies/mL). A negative result must be combined with clinical observations, patient history, and epidemiological information. The expected result is Negative.  Fact Sheet for Patients:  BloggerCourse.com  Fact Sheet for Healthcare Providers:  SeriousBroker.it  This test is no t yet approved or cleared by the [place]  FDA and  has been authorized for detection and/or diagnosis of SARS-CoV-2 by FDA under an Emergency Use Authorization (EUA). This EUA will remain  in effect (meaning this test can be used) for the duration of the COVID-19 declaration under Section 564(b)(1) of the Act, 21 U.S.C.section 360bbb-3(b)(1), unless the authorization is terminated  or revoked sooner.  Influenza A by PCR NEGATIVE NEGATIVE Final   Influenza B by PCR NEGATIVE NEGATIVE Final    Comment: (NOTE) The Xpert Xpress SARS-CoV-2/FLU/RSV plus assay is intended as an aid in the diagnosis of influenza from Nasopharyngeal swab specimens and should not be used as a sole basis for treatment. Nasal washings and aspirates are  unacceptable for Xpert Xpress SARS-CoV-2/FLU/RSV testing.  Fact Sheet for Patients: BloggerCourse.com  Fact Sheet for Healthcare Providers: SeriousBroker.it  This test is not yet approved or cleared by the [place]  FDA and has been authorized for detection and/or diagnosis of SARS-CoV-2 by FDA under an Emergency Use Authorization (EUA). This EUA will remain in effect (meaning this test can be used) for the duration of the COVID-19 declaration under Section 564(b)(1) of the Act, 21 U.S.C. section 360bbb-3(b)(1), unless the authorization is terminated or revoked.     Resp Syncytial Virus by PCR NEGATIVE NEGATIVE Final    Comment: (NOTE) Fact Sheet for Patients: BloggerCourse.com  Fact Sheet for Healthcare Providers: SeriousBroker.it  This test is not yet approved or cleared by the [place]  FDA and has been authorized for detection and/or diagnosis of SARS-CoV-2 by FDA under an Emergency Use Authorization (EUA). This EUA will remain in effect (meaning this test can be used) for the duration of the COVID-19 declaration under Section 564(b)(1) of the Act, 21 U.S.C. section 360bbb-3(b)(1), unless the authorization is terminated or revoked.  Performed at [facility], [address] [place], [place] [address]   Blood Culture ID Panel (Reflexed)     Status: Abnormal   Collection Time: 10/28/23 11:14 AM  Result Value Ref Range Status   Enterococcus faecalis NOT DETECTED NOT DETECTED Final   Enterococcus Faecium NOT DETECTED NOT DETECTED Final   Listeria monocytogenes NOT DETECTED NOT DETECTED Final   Staphylococcus species DETECTED (A) NOT DETECTED Final    Comment: CRITICAL RESULT CALLED TO, READ BACK BY AND VERIFIED WITH: Lafrances Pigeon [facility] 6045 10/29/23 HNM    Staphylococcus aureus (BCID) NOT DETECTED NOT DETECTED Final   Staphylococcus epidermidis DETECTED  (A) NOT DETECTED Final    Comment: CRITICAL RESULT CALLED TO, READ BACK BY AND VERIFIED WITH: Lafrances Pigeon [facility] 4098 10/29/23 HNM    Staphylococcus lugdunensis NOT DETECTED NOT DETECTED Final   Streptococcus species NOT DETECTED NOT DETECTED Final   Streptococcus agalactiae NOT DETECTED NOT DETECTED Final   Streptococcus pneumoniae NOT DETECTED NOT DETECTED Final   Streptococcus pyogenes NOT DETECTED NOT DETECTED Final   A.calcoaceticus-baumannii NOT DETECTED NOT DETECTED Final   Bacteroides fragilis NOT DETECTED NOT DETECTED Final   Enterobacterales NOT DETECTED NOT DETECTED Final   Enterobacter cloacae complex NOT DETECTED NOT DETECTED Final   Escherichia coli NOT DETECTED NOT DETECTED Final   Klebsiella aerogenes NOT DETECTED NOT DETECTED Final   Klebsiella oxytoca NOT DETECTED NOT DETECTED Final   Klebsiella pneumoniae NOT DETECTED NOT DETECTED Final   Proteus species NOT DETECTED NOT DETECTED Final   Salmonella species NOT DETECTED NOT DETECTED Final   Serratia marcescens NOT DETECTED NOT DETECTED Final   Haemophilus influenzae NOT DETECTED NOT DETECTED Final   Neisseria meningitidis NOT DETECTED NOT DETECTED Final   Pseudomonas aeruginosa NOT DETECTED NOT DETECTED Final   Stenotrophomonas maltophilia NOT DETECTED NOT DETECTED Final   Candida albicans NOT DETECTED NOT DETECTED Final   Candida auris NOT DETECTED NOT DETECTED Final   Candida glabrata NOT DETECTED NOT DETECTED Final   Candida krusei NOT DETECTED NOT DETECTED Final   Candida parapsilosis NOT DETECTED  NOT DETECTED Final   Candida tropicalis NOT DETECTED NOT DETECTED Final   Cryptococcus neoformans/gattii NOT DETECTED NOT DETECTED Final   Methicillin resistance mecA/C NOT DETECTED NOT DETECTED Final    Comment: Performed at [facility], [address] [place], [place] [address]  C Difficile Quick Screen w PCR reflex     Status: None   Collection Time: 10/29/23  5:42 PM   Specimen: STOOL  Result Value  Ref Range Status   C Diff antigen NEGATIVE NEGATIVE Final   C Diff toxin NEGATIVE NEGATIVE Final   C Diff interpretation No C. difficile detected.  Final    Comment: Performed at [facility], [address] [place], [place] [address]  Gastrointestinal Panel by PCR , Stool     Status: None   Collection Time: 10/29/23  5:42 PM   Specimen: STOOL  Result Value Ref Range Status   Campylobacter species NOT DETECTED NOT DETECTED Final   Plesimonas shigelloides NOT DETECTED NOT DETECTED Final   Salmonella species NOT DETECTED NOT DETECTED Final   Yersinia enterocolitica NOT DETECTED NOT DETECTED Final   Vibrio species NOT DETECTED NOT DETECTED Final   Vibrio cholerae NOT DETECTED NOT DETECTED Final   Enteroaggregative E coli (EAEC) NOT DETECTED NOT DETECTED Final   Enteropathogenic E coli (EPEC) NOT DETECTED NOT DETECTED Final   Enterotoxigenic E coli (ETEC) NOT DETECTED NOT DETECTED Final   Shiga like toxin producing E coli (STEC) NOT DETECTED NOT DETECTED Final   Shigella/Enteroinvasive E coli (EIEC) NOT DETECTED NOT DETECTED Final   Cryptosporidium NOT DETECTED NOT DETECTED Final   Cyclospora cayetanensis NOT DETECTED NOT DETECTED Final   Entamoeba histolytica NOT DETECTED NOT DETECTED Final   Giardia lamblia NOT DETECTED NOT DETECTED Final   Adenovirus F40/41 NOT DETECTED NOT DETECTED Final   Astrovirus NOT DETECTED NOT DETECTED Final   Norovirus GI/GII NOT DETECTED NOT DETECTED Final   Rotavirus A NOT DETECTED NOT DETECTED Final   Sapovirus (I, II, IV, and V) NOT DETECTED NOT DETECTED Final    Comment: Performed at [facility], [address] [place], [place] [address]  Culture, blood (single) w Reflex to ID Panel     Status: None   Collection Time: 10/29/23  5:55 PM   Specimen: BLOOD  Result Value Ref Range Status   Specimen Description BLOOD LEFT ANTECUBITAL  Final   Special Requests   Final    BOTTLES DRAWN AEROBIC AND ANAEROBIC Blood Culture adequate  volume   Culture   Final    NO GROWTH 5 DAYS Performed at [facility], [address] [place], [place] [address]    Report Status 11/03/2023 FINAL  Final  Respiratory (~20 pathogens) panel by PCR     Status: None   Collection Time: 10/31/23  5:50 PM   Specimen: Nasopharyngeal Swab; Respiratory  Result Value Ref Range Status   Adenovirus NOT DETECTED NOT DETECTED Final   Coronavirus 229E NOT DETECTED NOT DETECTED Final    Comment: (NOTE) The Coronavirus on the Respiratory Panel, DOES NOT test for the novel  Coronavirus (2019 nCoV)    Coronavirus HKU1 NOT DETECTED NOT DETECTED Final   Coronavirus NL63 NOT DETECTED NOT DETECTED Final   Coronavirus OC43 NOT DETECTED NOT DETECTED Final   Metapneumovirus NOT DETECTED NOT DETECTED Final   Rhinovirus / Enterovirus NOT DETECTED NOT DETECTED Final   Influenza A NOT DETECTED NOT DETECTED Final   Influenza B NOT DETECTED NOT DETECTED Final   Parainfluenza Virus  1 NOT DETECTED NOT DETECTED Final   Parainfluenza Virus 2 NOT DETECTED NOT DETECTED Final   Parainfluenza Virus 3 NOT DETECTED NOT DETECTED Final   Parainfluenza Virus 4 NOT DETECTED NOT DETECTED Final   Respiratory Syncytial Virus NOT DETECTED NOT DETECTED Final   Bordetella pertussis NOT DETECTED NOT DETECTED Final   Bordetella Parapertussis NOT DETECTED NOT DETECTED Final   Chlamydophila pneumoniae NOT DETECTED NOT DETECTED Final   Mycoplasma pneumoniae NOT DETECTED NOT DETECTED Final    Comment: Performed at [facility], [address], [place], [place] [address]  Culture, blood (Routine X 2) w Reflex to ID Panel     Status: None (Preliminary result)   Collection Time: 10/31/23  7:44 PM   Specimen: BLOOD RIGHT ARM  Result Value Ref Range Status   Specimen Description BLOOD RIGHT ARM  Final   Special Requests   Final    BOTTLES DRAWN AEROBIC AND ANAEROBIC Blood Culture adequate volume   Culture   Final    NO GROWTH 3 DAYS Performed at [facility], [address] [place], [place] [address]    Report Status PENDING  Incomplete  C Difficile Quick Screen w PCR reflex     Status: None   Collection Time: 11/01/23 12:36 AM   Specimen: STOOL  Result Value Ref Range Status   C Diff antigen NEGATIVE NEGATIVE Final   C Diff toxin NEGATIVE NEGATIVE Final   C Diff interpretation No C. difficile detected.  Final    Comment: Performed at [facility], [address], [place], [place] [address]  Gastrointestinal Panel by PCR , Stool     Status: None   Collection Time: 11/01/23 12:36 AM   Specimen: STOOL  Result Value Ref Range Status   Campylobacter species NOT DETECTED NOT DETECTED Final   Plesimonas shigelloides NOT DETECTED NOT DETECTED Final   Salmonella species NOT DETECTED NOT DETECTED Final   Yersinia enterocolitica NOT DETECTED NOT DETECTED Final   Vibrio species NOT DETECTED NOT DETECTED Final   Vibrio cholerae NOT DETECTED NOT DETECTED Final   Enteroaggregative E coli (EAEC) NOT DETECTED NOT DETECTED Final   Enteropathogenic E coli (EPEC) NOT DETECTED NOT DETECTED Final   Enterotoxigenic E coli (ETEC) NOT DETECTED NOT DETECTED Final   Shiga like toxin producing E coli (STEC) NOT DETECTED NOT DETECTED Final   Shigella/Enteroinvasive E coli (EIEC) NOT DETECTED NOT DETECTED Final   Cryptosporidium NOT DETECTED NOT DETECTED Final   Cyclospora cayetanensis NOT DETECTED NOT DETECTED Final   Entamoeba histolytica NOT DETECTED NOT DETECTED Final   Giardia lamblia NOT DETECTED NOT DETECTED Final   Adenovirus F40/41 NOT DETECTED NOT DETECTED Final   Astrovirus NOT DETECTED NOT DETECTED Final   Norovirus GI/GII NOT DETECTED NOT DETECTED Final   Rotavirus A NOT DETECTED NOT DETECTED Final   Sapovirus (I, II, IV, and V) NOT DETECTED NOT DETECTED Final    Comment: Performed at [facility], [address], [place], [place] [address]    RADIOLOGY STUDIES/RESULTS: No results found.    LOS: 3 days   Kimberly Penna,  MD  [facility]    To contact the attending provider between 7A-7P or the covering provider during after hours 7P-7A, please log into the web site [email or web address] and access using universal [place] password for that web site. If you do not have the password, please call the hospital operator.  11/03/2023, 10:46 AM

## 2023-11-03 NOTE — Plan of Care (Signed)
  Problem: Education: Goal: Knowledge of General Education information will improve Description: Including pain rating scale, medication(s)/side effects and non-pharmacologic comfort measures Outcome: Progressing   Problem: Health Behavior/Discharge Planning: Goal: Ability to manage health-related needs will improve Outcome: Progressing   Problem: Clinical Measurements: Goal: Will remain free from infection Outcome: Progressing Goal: Diagnostic test results will improve Outcome: Progressing   Problem: Activity: Goal: Risk for activity intolerance will decrease Outcome: Progressing   Problem: Coping: Goal: Level of anxiety will decrease Outcome: Progressing   Problem: Pain Managment: Goal: General experience of comfort will improve and/or be controlled Outcome: Progressing   Problem: Safety: Goal: Ability to remain free from injury will improve Outcome: Progressing

## 2023-11-03 NOTE — Plan of Care (Signed)
  Problem: Education: Goal: Knowledge of General Education information will improve Description: Including pain rating scale, medication(s)/side effects and non-pharmacologic comfort measures Outcome: Progressing   Problem: Clinical Measurements: Goal: Will remain free from infection Outcome: Progressing Goal: Diagnostic test results will improve Outcome: Progressing Goal: Respiratory complications will improve Outcome: Progressing   Problem: Activity: Goal: Risk for activity intolerance will decrease Outcome: Progressing   Problem: Nutrition: Goal: Adequate nutrition will be maintained Outcome: Progressing   Problem: Coping: Goal: Level of anxiety will decrease Outcome: Progressing   Problem: Elimination: Goal: Will not experience complications related to bowel motility Outcome: Progressing   Problem: Safety: Goal: Ability to remain free from injury will improve Outcome: Progressing

## 2023-11-03 NOTE — Plan of Care (Signed)

## 2023-11-03 NOTE — Procedures (Signed)
 Patient Name: [name]  MRN: [id number]  Epilepsy Attending: Arleene Lack  Referring Physician/Provider: Eleni Griffin, MD  Duration: 11/03/2023 4098 to 11/03/2023 1191   Patient history: 63yo M with intermittent confusion. EEG to evaluate for seizure   Level of alertness: Awake, asleep   AEDs during EEG study: LEV   Technical aspects: This EEG study was done with scalp electrodes positioned according to the 10-20 International system of electrode placement. Electrical activity was reviewed with band pass filter of 1-70Hz , sensitivity of 7 uV/mm, display speed of 44mm/sec with a 60Hz  notched filter applied as appropriate. EEG data were recorded continuously and digitally stored.  Video monitoring was available and reviewed as appropriate.   Description: The posterior dominant rhythm consists of 9-10 Hz activity of moderate voltage (25-35 uV) seen predominantly in posterior head regions, symmetric and reactive to eye opening and eye closing. There is intermittent generalized 3-5hz  theta-delta slowing admixed with 13 to 15 Hz beta activity distributed symmetrically and diffusely.  Hyperventilation and photic stimulation were not performed.      ABNORMALITY - Intermittent slow, generalized   IMPRESSION: This study is suggestive of mild diffuse encephalopathy. No seizures or epileptiform discharges were seen throughout the recording.   [name] O [name]

## 2023-11-03 NOTE — Progress Notes (Signed)
LTM EEG disconnected - no skin breakdown at unhook. [facility] notified.  

## 2023-11-04 DIAGNOSIS — K7031 Alcoholic cirrhosis of liver with ascites: Secondary | ICD-10-CM | POA: Diagnosis not present

## 2023-11-04 DIAGNOSIS — D649 Anemia, unspecified: Secondary | ICD-10-CM | POA: Diagnosis not present

## 2023-11-04 DIAGNOSIS — I10 Essential (primary) hypertension: Secondary | ICD-10-CM | POA: Diagnosis not present

## 2023-11-04 DIAGNOSIS — G9340 Encephalopathy, unspecified: Secondary | ICD-10-CM | POA: Diagnosis not present

## 2023-11-04 LAB — BASIC METABOLIC PANEL WITH GFR
Anion gap: 8 (ref 5–15)
BUN: 6 mg/dL — ABNORMAL LOW (ref 8–23)
CO2: 21 mmol/L — ABNORMAL LOW (ref 22–32)
Calcium: 7.9 mg/dL — ABNORMAL LOW (ref 8.9–10.3)
Chloride: 103 mmol/L (ref 98–111)
Creatinine, Ser: 0.51 mg/dL — ABNORMAL LOW (ref 0.61–1.24)
GFR, Estimated: >60 mL/min (ref >60)
Glucose, Bld: 163 mg/dL — ABNORMAL HIGH (ref 70–99)
Potassium: 3.9 mmol/L (ref 3.5–5.1)
Sodium: 132 mmol/L — ABNORMAL LOW (ref 135–145)

## 2023-11-04 LAB — CBC
HCT: 35.9 % — ABNORMAL LOW (ref 39.0–52.0)
Hemoglobin: 12.6 g/dL — ABNORMAL LOW (ref 13.0–17.0)
MCH: 33.7 pg (ref 26.0–34.0)
MCHC: 35.1 g/dL (ref 30.0–36.0)
MCV: 96 fL (ref 80.0–100.0)
Platelets: 154 10*3/uL (ref 150–400)
RBC: 3.74 MIL/uL — ABNORMAL LOW (ref 4.22–5.81)
RDW: 14.7 % (ref 11.5–15.5)
WBC: 8.2 10*3/uL (ref 4.0–10.5)
nRBC: 0 % (ref 0.0–0.2)

## 2023-11-04 LAB — GLUCOSE, CAPILLARY
Glucose-Capillary: 192 mg/dL — ABNORMAL HIGH (ref 70–99)
Glucose-Capillary: 143 mg/dL — ABNORMAL HIGH (ref 70–99)
Glucose-Capillary: 137 mg/dL — ABNORMAL HIGH (ref 70–99)
Glucose-Capillary: 164 mg/dL — ABNORMAL HIGH (ref 70–99)

## 2023-11-04 LAB — PHOSPHORUS: Phosphorus: 2.6 mg/dL (ref 2.5–4.6)

## 2023-11-04 LAB — MAGNESIUM: Magnesium: 1.7 mg/dL (ref 1.7–2.4)

## 2023-11-04 MED ORDER — AMOXICILLIN-POT CLAVULANATE 875-125 MG PO TABS
1.0000 | ORAL_TABLET | Freq: Two times a day (BID) | ORAL | Status: AC
  Administered 2023-11-04 – 2023-11-05 (×3): 1 via ORAL
  Filled 2023-11-04 (×3): qty 1

## 2023-11-04 NOTE — Progress Notes (Addendum)
 Inpatient Rehab Admissions Coordinator:    I spoke with Pt. And wife regarding CIR admit. Pt. Is now on board and says he will try CIR if it's needed. Wife can provide 24/7 support at d/c.   Addendum: Pt. Now ambulating with contact guard assist and PT is recommending HH. CIR will sign off.   Wandalee Gust, MS, CCC-SLP Rehab Admissions Coordinator  [phone] (celll) [phone] (office)

## 2023-11-04 NOTE — TOC Initial Note (Signed)
 Transition of Care [facility]) - Initial/Assessment Note    Patient Details  Name: [name] MRN: [id number] Date of Birth: 02/12/1951  [facility]) CM/SW Contact:    Andruw Battie E Brantly Kalman, LCSW Phone Number: 11/04/2023, 4:13 PM  Clinical Narrative:                 CSW spoke with patient. Patient lives with his spouse. Daughter lives next door, and son lives close by. PCP is Dr. Shann Darnel. Patient has a rolling walker and cane at home. Patient drives at baseline. Patient states he is agreeable to home health being arranged - updated RNCM.  Expected Discharge Plan: Home w Home Health Services Barriers to Discharge: Continued Medical Work up   Patient Goals and CMS Choice Patient states their goals for this hospitalization and ongoing recovery are:: agreeable to [facility] CMS Medicare.gov Compare Post Acute Care list provided to:: Patient Choice offered to / list presented to : Patient      Expected Discharge Plan and Services       Living arrangements for the past 2 months: Single Family Home                                      Prior Living Arrangements/Services Living arrangements for the past 2 months: Single Family Home Lives with:: Spouse Patient language and need for interpreter reviewed:: Yes Do you feel safe going back to the place where you live?: Yes      Need for Family Participation in Patient Care: Yes (Comment) Care giver support system in place?: Yes (comment) Current home services: DME Criminal Activity/Legal Involvement Pertinent to Current Situation/Hospitalization: No - Comment as needed  Activities of Daily Living   ADL Screening (condition at time of admission) Independently performs ADLs?: Yes (appropriate for developmental age) Does the patient have a NEW difficulty with bathing/dressing/toileting/self-feeding that is expected to last >3 days?: No Does the patient have a NEW difficulty with getting in/out of bed, walking, or climbing stairs that  is expected to last >3 days?: No Does the patient have a NEW difficulty with communication that is expected to last >3 days?: No Is the patient deaf or have difficulty hearing?: No Does the patient have difficulty seeing, even when wearing glasses/contacts?: No Does the patient have difficulty concentrating, remembering, or making decisions?: No  Permission Sought/Granted Permission sought to share information with : Facility Industrial/product designer granted to share information with : Yes, Verbal Permission Granted     Permission granted to share info w AGENCY: home health agencies        Emotional Assessment       Orientation: : Oriented to Self, Oriented to Place, Oriented to  Time, Oriented to Situation Alcohol / Substance Use: Not Applicable Psych Involvement: No (comment)  Admission diagnosis:  Acute encephalopathy [G93.40] Patient Active Problem List   Diagnosis Date Noted   HTN (hypertension) 10/31/2023   GERD (gastroesophageal reflux disease) 10/31/2023   DM (diabetes mellitus) (HCC) 10/31/2023   Anemia 10/31/2023   Depression 10/31/2023   Obese 10/31/2023   Alcohol use 10/31/2023   Acute encephalopathy 10/31/2023   CVA (cerebral vascular accident) (HCC) 10/31/2023   Severe sepsis (HCC) 10/28/2023   Aspiration pneumonia (HCC) 10/28/2023   Alcoholic cirrhosis of liver with ascites (HCC) 10/28/2023   PCP:  Lamon Pillow, MD Pharmacy:   [facility] [phone] - [place], [place] - [address] AT  [facility] [address] [place] [address] Phone: [phone] Fax: [phone]     Social Drivers of Health (SDOH) Social History: SDOH Screenings   Food Insecurity: No Food Insecurity (10/31/2023)  Housing: Low Risk  (10/31/2023)  Transportation Needs: No Transportation Needs (10/31/2023)  Utilities: Not At Risk (10/31/2023)  Social Connections: Moderately Integrated (10/31/2023)  Tobacco Use: Medium Risk (10/31/2023)   SDOH  Interventions:     Readmission Risk Interventions     No data to display

## 2023-11-04 NOTE — Care Management Important Message (Signed)
 Important Message  Patient Details  Name: [name] MRN: [id number] Date of Birth: [birth year]   Important Message Given:  Yes - Medicare IM     Wynonia Hedges 11/04/2023, 4:06 PM

## 2023-11-04 NOTE — TOC CAGE-AID Note (Signed)
 Transition of Care [facility]) - CAGE-AID Screening   Patient Details  Name: [name] MRN: [id number] Date of Birth: [birth year]  Transition of [facility]) CM/SW Contact:    Haylynn Pha E Armelia Penton, LCSW Phone Number: 11/04/2023, 4:11 PM   Clinical Narrative: Patient states he used alcohol in the past but "not anymore." Patient declined the need for resources.  CAGE-AID Screening:    Have You Ever Felt You Ought to Cut Down on Your Drinking or Drug Use?: No Have People Annoyed You By Critizing Your Drinking Or Drug Use?: No Have You Felt Bad Or Guilty About Your Drinking Or Drug Use?: No Have You Ever Had a Drink or Used Drugs First Thing In The Morning to Steady Your Nerves or to Get Rid of a Hangover?: No CAGE-AID Score: 0  Substance Abuse Education Offered: Yes

## 2023-11-04 NOTE — Progress Notes (Signed)
 Occupational Therapy Treatment Patient Details Name: [name] MRN: [id number] DOB: 01/09/1951 Today's Date: 11/04/2023   History of present illness Tedford Mcgowen is a 63 y.o. male presenting 4/24 with AMS.  Pt found to have severe sepsis, acute metabolic encephalopathy, acute on chronic alcoholic hepatitis, mild rhabdomyolysis, and hypokalemia.  Per neurologist note on 4/23 pt with cerebellar infarct. PMH: alcohol abuse, chronic pain syndrome on chronic narcotic therapy, IDDM, HTN   OT comments  Pt making steady progress towards OT goals. Pt able to mobilize using RW with CGA though still below baseline of not needing AD for mobility. Pt able to manage various UB ADLs standing at sink w/o LOB but does require Min A for LB ADL mgmt. Discussed DME needs at home and IADL assist needs w/ pt reporting daughter may be able to provide prn assist for community tasks as wife does not manage community tasks due to compromised immune system. As pt reports normally very active and independent, still feel intensive rehab services may maximize independence and decrease fall risk quickly.       If plan is discharge home, recommend the following:  A little help with walking and/or transfers;Help with stairs or ramp for entrance;Assistance with cooking/housework;Direct supervision/assist for medications management;Direct supervision/assist for financial management;Assist for transportation;A little help with bathing/dressing/bathroom   Equipment Recommendations  Other (comment) (RW if does not already have)    Recommendations for Other Services Rehab consult    Precautions / Restrictions Precautions Precautions: Fall Recall of Precautions/Restrictions: Impaired Restrictions Weight Bearing Restrictions Per Provider Order: No       Mobility Bed Mobility               General bed mobility comments: in recliner    Transfers Overall transfer level: Needs assistance Equipment used: Rolling walker  (2 wheels) Transfers: Sit to/from Stand Sit to Stand: Contact guard assist                 Balance Overall balance assessment: Needs assistance Sitting-balance support: Feet supported, No upper extremity supported Sitting balance-Leahy Scale: Good     Standing balance support: During functional activity, No upper extremity supported Standing balance-Leahy Scale: Fair                             ADL either performed or assessed with clinical judgement   ADL Overall ADL's : Needs assistance/impaired     Grooming: Supervision/safety;Standing;Minimal assistance;Brushing hair;Oral care Grooming Details (indicate cue type and reason): able to manage oral care standing at sink without assist. Min A for combing hair in back and shampoo cap with difficulty removing glue from EEG             Lower Body Dressing: Minimal assistance;Sitting/lateral leans;Sit to/from stand Lower Body Dressing Details (indicate cue type and reason): continued difficulty and increased effort noted with sock mgmt w/ decreased flexibility noted             Functional mobility during ADLs: Contact guard assist;Rolling walker (2 wheels) General ADL Comments: Discussed DME use, who can assist with IADLs as pt normally manages all community tasks    Extremity/Trunk Assessment Upper Extremity Assessment Upper Extremity Assessment: Overall WFL for tasks assessed;Right hand dominant   Lower Extremity Assessment Lower Extremity Assessment: Defer to PT evaluation        Vision   Vision Assessment?: No apparent visual deficits   Perception     Praxis  Communication Communication Communication: No apparent difficulties   Cognition Arousal: Alert Behavior During Therapy: WFL for tasks assessed/performed Cognition: No family/caregiver present to determine baseline             OT - Cognition Comments: improving cognition, appropriate during session, minor memory deficits and  minor cues for problem solving                 Following commands: Impaired Following commands impaired: Follows multi-step commands inconsistently, Follows multi-step commands with increased time      Cueing   Cueing Techniques: Verbal cues, Gestural cues  Exercises      Shoulder Instructions       General Comments SpO2 95% on RA with activity, discussed with RN and ok to leave on RA at end of session    Pertinent Vitals/ Pain       Pain Assessment Pain Assessment: Faces Faces Pain Scale: Hurts a little bit Pain Location: B calf muscles Pain Descriptors / Indicators: Sore Pain Intervention(s): Monitored during session, Limited activity within patient's tolerance  Home Living                                          Prior Functioning/Environment              Frequency  Min 2X/week        Progress Toward Goals  OT Goals(current goals can now be found in the care plan section)  Progress towards OT goals: Progressing toward goals  Acute Rehab OT Goals Patient Stated Goal: be able to go home OT Goal Formulation: With patient Time For Goal Achievement: 11/15/23 Potential to Achieve Goals: Good ADL Goals Pt Will Perform Grooming: with contact guard assist;standing Pt Will Perform Lower Body Bathing: with min assist;sitting/lateral leans;sit to/from stand Pt Will Perform Lower Body Dressing: with min assist;sitting/lateral leans;sit to/from stand Pt Will Transfer to Toilet: with contact guard assist;ambulating  Plan      Co-evaluation                 AM-PAC OT "6 Clicks" Daily Activity     Outcome Measure   Help from another person eating meals?: None Help from another person taking care of personal grooming?: A Little Help from another person toileting, which includes using toliet, bedpan, or urinal?: A Lot Help from another person bathing (including washing, rinsing, drying)?: A Lot Help from another person to put on and  taking off regular upper body clothing?: A Little Help from another person to put on and taking off regular lower body clothing?: A Little 6 Click Score: 17    End of Session Equipment Utilized During Treatment: Gait belt;Rolling walker (2 wheels)  OT Visit Diagnosis: Other abnormalities of gait and mobility (R26.89);Muscle weakness (generalized) (M62.81)   Activity Tolerance Patient tolerated treatment well   Patient Left in chair;with call bell/phone within reach;with chair alarm set   Nurse Communication Mobility status        Time: 4098-1191 OT Time Calculation (min): 33 min  Charges: OT General Charges $OT Visit: 1 Visit OT Treatments $Self Care/Home Management : 23-37 mins  Lawrence Pretty, OTR/L Acute Rehab Services Office: [phone]   Annabella Barr 11/04/2023, 1:33 PM

## 2023-11-04 NOTE — Progress Notes (Signed)
 Physical Therapy Treatment Patient Details Name: [name] MRN: [id number] DOB: 1950-11-22 Today's Date: 11/04/2023   History of Present Illness Fremon Ohlman is a 63 y.o. male presenting 4/24 with AMS.  Pt found to have severe sepsis, acute metabolic encephalopathy, acute on chronic alcoholic hepatitis, mild rhabdomyolysis, and hypokalemia.  Per neurologist note on 4/23 pt with cerebellar infarct. PMH: alcohol abuse, chronic pain syndrome on chronic narcotic therapy, IDDM, HTN    PT Comments  Significant improvement in functional status since last PT visit. Able to ambulate to bathroom, perform self-care without physical assist.  CGA with transfers and gait up to 80 feet today while suing RW. He is agreeable to RW use at d/c as well and already has one. VSS throughout on RA. Still complains of BIL LE soreness but states they feel a little better after mobilizing. Recs updated to HHPT, pt agreeable. Patient will continue to benefit from skilled physical therapy services to further improve independence with functional mobility.    If plan is discharge home, recommend the following: Assist for transportation;Help with stairs or ramp for entrance;Assistance with cooking/housework   Can travel by private vehicle     Yes  Equipment Recommendations  None recommended by PT    Recommendations for Other Services       Precautions / Restrictions Precautions Precautions: Fall Recall of Precautions/Restrictions: Impaired Restrictions Weight Bearing Restrictions Per Provider Order: No     Mobility  Bed Mobility Overal bed mobility: Needs Assistance Bed Mobility: Supine to Sit Rolling: Supervision         General bed mobility comments: Supervision for safety, slow and effortful to rise to EOB but did not require pysical assist. Cues to facilitate.    Transfers Overall transfer level: Needs assistance Equipment used: Rolling walker (2 wheels) Transfers: Sit to/from Stand Sit to Stand:  Contact guard assist           General transfer comment: CGA for safety to rise from bed and toilet. Cues for hand placement. RW to stabilize upon rising.    Ambulation/Gait Ambulation/Gait assistance: Contact guard assist Gait Distance (Feet): 80 Feet (+15) Assistive device: Rolling walker (2 wheels), None Gait Pattern/deviations: Step-through pattern, Decreased stride length, Trunk flexed Gait velocity: dec Gait velocity interpretation: <1.31 ft/sec, indicative of household ambulator   General Gait Details: Educated on safe AD use with RW for support, CGA for safety, no buckling or overt LOB. Trial of shorter distance without AD, CGA for safety, reaches for furniture but without overt LOB. Cues throughout for safety.   Stairs             Wheelchair Mobility     Tilt Bed    Modified Rankin (Stroke Patients Only) Modified Rankin (Stroke Patients Only) Pre-Morbid Rankin Score: No significant disability Modified Rankin: Moderately severe disability     Balance Overall balance assessment: Needs assistance Sitting-balance support: Feet supported, No upper extremity supported Sitting balance-Leahy Scale: Fair Sitting balance - Comments: LOB with dynamic tasks   Standing balance support: During functional activity, No upper extremity supported Standing balance-Leahy Scale: Fair Standing balance comment: Stood to perform peri-care after toileting. CGA without RW                            Communication Communication Communication: No apparent difficulties  Cognition Arousal: Alert Behavior During Therapy: WFL for tasks assessed/performed   PT - Cognitive impairments: No family/caregiver present to determine baseline  Following commands: Impaired Following commands impaired: Follows multi-step commands inconsistently, Follows multi-step commands with increased time    Cueing Cueing Techniques: Verbal cues, Gestural  cues  Exercises      General Comments General comments (skin integrity, edema, etc.): SpO2 94-96% on RA while ambulating. 99% on 2L at rest.      Pertinent Vitals/Pain Pain Assessment Pain Assessment: Faces Pain Location: BLE Pain Descriptors / Indicators: Sore Pain Intervention(s): Monitored during session, Repositioned    Home Living                          Prior Function            PT Goals (current goals can now be found in the care plan section) Acute Rehab PT Goals Patient Stated Goal: To get back home PT Goal Formulation: With patient Time For Goal Achievement: 11/11/23 Potential to Achieve Goals: Good Progress towards PT goals: Progressing toward goals    Frequency    Min 2X/week      PT Plan      Co-evaluation              AM-PAC PT "6 Clicks" Mobility   Outcome Measure  Help needed turning from your back to your side while in a flat bed without using bedrails?: None Help needed moving from lying on your back to sitting on the side of a flat bed without using bedrails?: A Little Help needed moving to and from a bed to a chair (including a wheelchair)?: A Little Help needed standing up from a chair using your arms (e.g., wheelchair or bedside chair)?: A Little Help needed to walk in hospital room?: A Little Help needed climbing 3-5 steps with a railing? 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

## 2023-11-04 NOTE — TOC Progression Note (Signed)
 Transition of Care [facility]) - Progression Note    Patient Details  Name: [name] MRN: [id number] Date of Birth: 1950/10/10  [facility] CM/SW Contact  Eusebio High, RN Phone Number: 11/04/2023, 4:22 PM  Clinical Narrative:     RNCM received chat from CSW that patient is in favor of Home Health now that patient has progressed beyond CIR acceptance. [facility] has accepted. AVS updated. Patient states he has a rolling walker at home. Wife to transport at DC.   TOC will continue to follow patient for any additional discharge needs       Expected Discharge Plan: Home w Home Health Services Barriers to Discharge: Continued Medical Work up  Expected Discharge Plan and Services       Living arrangements for the past 2 months: Single Family Home                                       Social Determinants of Health (SDOH) Interventions SDOH Screenings   Food Insecurity: No Food Insecurity (10/31/2023)  Housing: Low Risk  (10/31/2023)  Transportation Needs: No Transportation Needs (10/31/2023)  Utilities: Not At Risk (10/31/2023)  Social Connections: Moderately Integrated (10/31/2023)  Tobacco Use: Medium Risk (10/31/2023)    Readmission Risk Interventions     No data to display

## 2023-11-04 NOTE — Discharge Instructions (Signed)
 In a time of Crisis: [facility]  Mobile Crisis Management provides immediate crisis response, 24/7.  Call [phone]  [facility] for MH/DD/[facility] is available 24 hours a day, 7 days a week. Customer Service Specialists will assist you to find a crisis provider that is well-matched with your needs. Your local number is: [phone]  [facility] ([facility]) IOP, individual counseling, medication management [address] [place], [place] [address] [phone] Call for intake hours; Medicaid and Uninsured    Outpatient Providers  [facility] Group and individual counseling. [address]  [place], [place] [address] [phone] [place]: [phone]  [place]: [phone] Medicaid and uninsured.   [facility] Offers IOP groups multiple times per week. [address] [place], [place] [address] [phone] Takes Medicaid and other insurances.   [facility]  Chemical Dependency Intensive Outpatient Program (IOP) [address] [place], [place] [address] [phone] Takes Nurse, learning disability and [place].   [facility]  IOP and Partial Hospitalization Program  [address]  [place], [place] [address] [phone] Private Insurance, [place] only for partial hospitalization  [facility] [address], [address], [place], [place] [address] [phone] Monday-Friday. Short and Long term options. [facility] ([facility]) IOP, individual counseling, medication management [address] [place], [place] [address] [phone] Medicaid and [facility]  [facility] [address]  [place], [place] [address] [phone] Private Insurance and Self Pay   [facility] [address]  [place], [place] [address] [phone] Private Insurance, [place], and Self Pay   [facility]  [address] [place], [place] [address] [facility]  [address]  [place], [place] [address] [phone]  [facility]  [address] [place], [place] [address] [phone]  [facility] [phone] Mar[facility] P[facility] and [facility]      Residential Treatment Programs  [facility] [address] [place], [place] [address] [phone] or [phone] Detox and Residential Rehab 14 days (Medicare, Medicaid, private insurance, and self pay). No methadone. Call for pre-screen.   [facility]) [address]  [place], [place] [address] [phone] Detox (self Pay and Medicaid Limited availability) Rehab Only Male (Medicare, Medicaid, and Self Pay)-No methadone.  [facility] [address] [place], [place] [address] [phone] or [phone] Private Insurance only  [facility] [address] [place], [place] [address] Phone:  [phone] Must be detoxed 72 hours prior to admission; 28 day program.  Self-pay.  [facility] [address]  [place], [place] [phone] [employer], Medicare, [place] (not straight [place]). They offer assistance with transportation.   [facility] [address] [place],  [place], [place] [address] [phone] Christian Based Program. Men only. No insurance  [facility] [address] [place], [place] [address] Women's: [phone] Men's: [phone] No Medicaid.   [facility] [place] Locations across the [place] (mainly [place]) willing to help with transportation.  [phone] [employer]. [facility]  [address] [place]  [place], [place] [address] [phone] Treatment Only, must make  assessment appointment, and must be sober for assessment appointment. Self pay, Medicare A and B, [facility],  must be [facility] resident. No methadone.   [address] [place], [place] [address] Phone: [phone] Inpatient 24/7 and outpatient services. Individuals with Medicaid have no obligation for a copay. Individuals with Medicare or private insurance will be obligated to meet their policy's requirement(s). Individuals who are uninsured will be eligible for a sliding or discounted scaled  [facility]  [address] [place], [place] [address] [phone] No pending legal charges, Long-term work program. No methadone. Call for assessment.  [facility]  [address] [place], [place] [address] [phone] or [phone] Commercial Insurance Only  [facility] - [phone] Private Insurance (no [place]). Males/Females, call to make referrals, multiple facilities   [facility] [address]  [place], [place] [address]  [phone] Men Only Upfront Fee   [facility]-no methadone [facility] [address] [place], [place] [address] [phone] (f)         AA Meetings Website to locate meetings (virtually or in person): [email or web address] Phone: [phone]

## 2023-11-04 NOTE — Progress Notes (Signed)
 PROGRESS NOTE        PATIENT DETAILS Name: [name] Age: 63 y.o. Sex: male Date of Birth: 10-01-1950 Admit Date: 10/31/2023 Admitting Physician Johnetta Nab, MD ZOX:[id number], Erlinda Haws, MD  Brief Summary: Patient is a 63 y.o.  male with history of EtOH use, HTN, DM-2, liver cirrhosis-who was admitted to [facility] for acute metabolic encephalopathy likely in the setting of new onset seizures-upon further evaluation-patient was also found to have aspiration pneumonia-MRI revealed acute CVA.  Patient was transferred to [facility] for LTM EEG.  Significant events: 4/24>> transferred to [facility] from [facility] for LTM EEG.  Significant studies: 4/21>> UDS: Negative 4/21>> A1c: 5.7 4/21>> CT head: No acute intracranial abnormality 4/21>> CT abdomen/pelvis: Patchy airspace opacities of both lungs, severe hepatic steatosis with contour irregularity of the liver suspicious for cirrhosis. 4/22>> MRI brain: Right cerebellar subacute infarction-no hemorrhage (addended report) 4/22>> Spot EEG: Left fronto-central seizure lasting 8 minutes. 4/23>> CT angio head/neck: No LVO or significant stenosis 4/24>> LDL: 15 4/24-4/25>> LTM EEG: No seizures. 4/25>> echo: EF 55-60%, grade 1 diastolic dysfunction. 4/26-4/27>> LTM EEG: Negative  Significant microbiology data: 4/21>> blood culture: Staph epidermidis (contamination) 4/22>> blood culture: Negative 4/24>> respiratory virus panel: Negative 4/24>> blood culture: No growth 4/25>> stool C. difficile PCR: Negative  Procedures: None  Consults: Neuro  Subjective: Awake/alert-no major issues overnight.  Objective: Vitals: Blood pressure 117/75, pulse 79, temperature 98.2 F (36.8 C), temperature source Oral, resp. rate 17, height 5\' 6"  (1.676 m), weight 69.1 kg, SpO2 97%.   Exam: Awake/alert Chest clear to auscultation Nonfocal exam No leg edema.  Pertinent Labs/Radiology:    Latest Ref Rng & Units 11/04/2023    3:55 AM  11/02/2023    3:33 AM 11/01/2023    5:17 AM  CBC  WBC 4.0 - 10.5 K/uL 8.2  10.8  10.6   Hemoglobin 13.0 - 17.0 g/dL 40.9  81.1  91.4   Hematocrit 39.0 - 52.0 % 35.9  35.6  35.9   Platelets 150 - 400 K/uL 154  101  91     Lab Results  Component Value Date   NA 132 (L) 11/04/2023   K 3.9 11/04/2023   CL 103 11/04/2023   CO2 21 (L) 11/04/2023      Assessment/Plan: Acute metabolic encephalopathy Secondary to sepsis physiology from pneumonia and seizure LTM EEG negative  Neuroimaging as above Mental status continues to improve-relatively awake/alert this morning.  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 Standard seizure precautions Patient aware of driving restrictions.  Acute ischemic CVA No focal deficits Workup as above Etiology thought to be embolic Continue aspirin  Telemetry monitoring Neurology recommending outpatient cardiac monitor-will need to contact cardiology when closer to discharge. CIR recommended on discharge-medically stable-awaiting insurance authorization/bed placement.  Sepsis secondary to presumed aspiration pneumonia Clinically improved  Repeat cultures negative Continue Unasyn  Hypokalemia/hypophosphatemia/hypomagnesemia Related to EtOH use Repleted.  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  Alcoholic hepatitis Significant improvement in  transaminitis Follow LFTs periodically  EtOH use Awake/alert No major tremors Ativan  per CIWA protocol  GERD PPI  Chronic opiate dependence/chronic pain syndrome Continue as  needed oxycodone   Mood disorder Appears stable Zoloft   BMI: Estimated body mass index is 24.59 kg/m as calculated from the following:   Height as of this encounter: 5\' 6"  (1.676 m).   Weight as of this encounter: 69.1 kg.   Code status:   Code Status: Full Code   DVT Prophylaxis: enoxaparin  (LOVENOX ) injection 40 mg Start: 10/31/23 2100 SCDs Start: 10/31/23 1721    Family Communication: Spouse-[phone] updated over the phone 4/26   Disposition Plan: Status is: Inpatient Remains inpatient appropriate because: Severity of illness   Planned Discharge Destination: CIR when bed available-medically stable.   Diet: Diet Order             Diet heart healthy/carb modified Room service appropriate? Yes; Fluid consistency: Thin; Fluid restriction: 1800 mL Fluid  Diet effective now                     Antimicrobial agents: Anti-infectives (From admission, onward)    Start     Dose/Rate Route Frequency Ordered Stop   11/04/23 1000  amoxicillin -clavulanate (AUGMENTIN ) 875-125 MG per tablet 1 tablet        1 tablet Oral Every 12 hours 11/04/23 0820 11/06/23 0959   11/02/23 1700  Ampicillin-Sulbactam (UNASYN) 3 g in sodium chloride  0.9 % 100 mL IVPB  Status:  Discontinued        3 g 200 mL/hr over 30 Minutes Intravenous Every 6 hours 11/02/23 1019 11/04/23 0820   11/01/23 0200  doxycycline  (VIBRAMYCIN ) 100 mg in sodium chloride  0.9 % 250 mL IVPB  Status:  Discontinued        100 mg 125 mL/hr over 120 Minutes Intravenous Every 12 hours 10/31/23 1722 10/31/23 1751   11/01/23 0008  vancomycin  (VANCOREADY) IVPB 750 mg/150 mL  Status:  Discontinued        750 mg 150 mL/hr over 60 Minutes Intravenous Every 12 hours 10/31/23 1814 11/02/23 0946   10/31/23 2300  metroNIDAZOLE  (FLAGYL ) IVPB 500 mg  Status:  Discontinued        500 mg 100 mL/hr over 60 Minutes Intravenous Every 12 hours 10/31/23 1722 11/02/23 1018   10/31/23 2000  cefTRIAXone  (ROCEPHIN ) 2 g in sodium  chloride 0.9 % 100 mL IVPB  Status:  Discontinued        2 g 200 mL/hr over 30 Minutes Intravenous Every 24 hours 10/31/23 1722 10/31/23 1751   10/31/23 1915  Vancomycin  (VANCOCIN ) 1,250 mg in sodium chloride  0.9 % 250 mL IVPB        1,250 mg 166.7 mL/hr over 90 Minutes Intravenous  Once 10/31/23 1817 11/01/23 0230   10/31/23 1900  ceFEPIme  (MAXIPIME ) 2 g in sodium chloride  0.9 % 100 mL IVPB  Status:  Discontinued        2 g 200 mL/hr over 30 Minutes Intravenous Every 8 hours 10/31/23 1810 11/02/23 0946   10/31/23 1900  vancomycin  (VANCOREADY) IVPB 1250 mg/250 mL  Status:  Discontinued        1,250 mg 166.7 mL/hr over 90 Minutes Intravenous  Once 10/31/23 1813 10/31/23 1817        MEDICATIONS: Scheduled Meds:  allopurinol   100 mg Oral Daily   amoxicillin -clavulanate  1 tablet Oral Q12H   aspirin   81 mg Oral Daily   dextromethorphan -guaiFENesin   1 tablet Oral BID   enoxaparin  (  LOVENOX ) injection  40 mg Subcutaneous Q24H   folic acid   1 mg Oral Daily   insulin  aspart  0-15 Units Subcutaneous TID WC   levETIRAcetam   1,500 mg Oral BID   multivitamin with minerals  1 tablet Oral Daily   pantoprazole   40 mg Oral QPM   sertraline   100 mg Oral Daily   sodium chloride  flush  3 mL Intravenous Q12H   thiamine   500 mg Oral Daily   Or   thiamine   500 mg Intravenous Daily   Continuous Infusions:   PRN Meds:.acetaminophen  **OR** acetaminophen , bisacodyl , hydrALAZINE , HYDROmorphone  (DILAUDID ) injection, ondansetron  **OR** ondansetron  (ZOFRAN ) IV, oxyCODONE , senna-docusate   I have personally reviewed following labs and imaging studies  LABORATORY DATA: CBC: Recent Labs  Lab 10/28/23 1114 10/29/23 0518 10/30/23 0509 10/31/23 0338 11/01/23 0517 11/02/23 0333 11/04/23 0355  WBC 19.5*   < > 10.8* 8.0 10.6* 10.8* 8.2  NEUTROABS 17.3*  --  9.1* 6.1 8.1* 8.2*  --   HGB 16.1   < > 13.1 12.5* 12.8* 12.7* 12.6*  HCT 43.8   < > 36.1* 34.5* 35.9* 35.6* 35.9*  MCV 92.2   < > 93.3 94.8  95.7 94.7 96.0  PLT 141*   < > 84* 83* 91* 101* 154   < > = values in this interval not displayed.    Basic Metabolic Panel: Recent Labs  Lab 10/30/23 0509 10/31/23 0338 11/01/23 0517 11/02/23 0333 11/03/23 0555 11/04/23 0355  NA 129* 136 133* 133* 131* 132*  K 3.2* 3.1* 4.2 3.4* 3.8 3.9  CL 101 106 106 105 107 103  CO2 22 20* 20* 20* 19* 21*  GLUCOSE 172* 116* 166* 174* 186* 163*  BUN 15 14 13 11 8  6*  CREATININE 0.57* 0.48* 0.72 0.63 0.51* 0.51*  CALCIUM 7.9* 8.0* 7.9* 8.1* 7.9* 7.9*  MG 1.8  --   --  1.7 1.8 1.7  PHOS 1.2*  --   --  1.7* 2.1* 2.6    GFR: Estimated Creatinine Clearance: 74.2 mL/min (A) (by C-G formula based on SCr of 0.51 mg/dL (L)).  Liver Function Tests: Recent Labs  Lab 10/28/23 1114 10/29/23 0518 10/30/23 0509 11/01/23 0517  AST 132* 181* 139* 37  ALT 47* 63* 79* 51*  ALKPHOS 178* 130* 122 99  BILITOT 3.7* 3.0* 3.2* 1.9*  PROT 6.3* 5.6* 5.4* 4.9*  ALBUMIN  3.2* 2.7* 2.7* 2.3*   No results for input(s): "LIPASE", "AMYLASE" in the last 168 hours. Recent Labs  Lab 10/28/23 1114 11/02/23 0333  AMMONIA 27 36*    Coagulation Profile: Recent Labs  Lab 10/28/23 1114 10/30/23 0018  INR 1.4* 1.6*    Cardiac Enzymes: Recent Labs  Lab 10/28/23 1114 10/29/23 0518 10/29/23 1529  CKTOTAL 2,356* 1,847* 1,574*    BNP (last 3 results) No results for input(s): "PROBNP" in the last 8760 hours.  Lipid Profile: Recent Labs    11/02/23 0333  CHOL 39  HDL 17*  LDLCALC 15  TRIG 37  CHOLHDL 2.3    Thyroid  Function Tests: Recent Labs    11/02/23 0333 11/03/23 0555  TSH 4.762*  --   FREET4  --  1.41*    Anemia Panel: Recent Labs    11/02/23 0333  VITAMINB12 1,331*  FOLATE 32.9    Urine analysis:    Component Value Date/Time   COLORURINE AMBER (A) 10/28/2023 1114   APPEARANCEUR CLEAR (A) 10/28/2023 1114   LABSPEC 1.023 10/28/2023 1114   PHURINE 5.0 10/28/2023 1114   GLUCOSEU  NEGATIVE 10/28/2023 1114   HGBUR NEGATIVE  10/28/2023 1114   BILIRUBINUR NEGATIVE 10/28/2023 1114   KETONESUR 20 (A) 10/28/2023 1114   PROTEINUR 100 (A) 10/28/2023 1114   NITRITE NEGATIVE 10/28/2023 1114   LEUKOCYTESUR NEGATIVE 10/28/2023 1114    Sepsis Labs: Lactic Acid, Venous    Component Value Date/Time   LATICACIDVEN 1.0 10/30/2023 1912    MICROBIOLOGY: Recent Results (from the past 240 hours)  Blood Culture (routine x 2)     Status: None   Collection Time: 10/28/23 11:14 AM   Specimen: BLOOD  Result Value Ref Range Status   Specimen Description BLOOD LEFT ARM  Final   Special Requests   Final    BOTTLES DRAWN AEROBIC AND ANAEROBIC Blood Culture results may not be optimal due to an inadequate volume of blood received in culture bottles   Culture   Final    NO GROWTH 5 DAYS Performed at [facility], [address] [place], [place] [address]    Report Status 11/02/2023 FINAL  Final  Blood Culture (routine x 2)     Status: Abnormal   Collection Time: 10/28/23 11:14 AM   Specimen: BLOOD  Result Value Ref Range Status   Specimen Description   Final    BLOOD RIGHT ARM Performed at [facility], [address] [place], [place] [address]    Special Requests   Final    BOTTLES DRAWN AEROBIC AND ANAEROBIC Blood Culture results may not be optimal due to an inadequate volume of blood received in culture bottles Performed at [facility], [address] [place], [place] [address]    Culture  Setup Time   Final    GRAM POSITIVE COCCI AEROBIC BOTTLE ONLY CRITICAL RESULT CALLED TO, READ BACK BY AND VERIFIED WITH: WILL Alva [facility] [id number] 10/29/23 HNM    Culture (A)  Final    STAPHYLOCOCCUS EPIDERMIDIS THE SIGNIFICANCE OF ISOLATING THIS ORGANISM FROM A SINGLE SET OF BLOOD CULTURES WHEN MULTIPLE SETS ARE DRAWN IS UNCERTAIN. PLEASE NOTIFY THE MICROBIOLOGY DEPARTMENT WITHIN ONE WEEK IF SPECIATION AND SENSITIVITIES ARE REQUIRED. Performed at [facility], [address]  [place], [place] [address]    Report Status 10/31/2023 FINAL  Final  Resp panel by RT-PCR (RSV, Flu A&B, Covid) Urine, Catheterized     Status: None   Collection Time: 10/28/23 11:14 AM   Specimen: Urine, Catheterized; Nasal Swab  Result Value Ref Range Status   SARS Coronavirus 2 by RT PCR NEGATIVE NEGATIVE Final    Comment: (NOTE) SARS-CoV-2 target nucleic acids are NOT DETECTED.  The SARS-CoV-2 RNA is generally detectable in upper respiratory specimens during the acute phase of infection. The lowest concentration of SARS-CoV-2 viral copies this assay can detect is 138 copies/mL. A negative result does not preclude SARS-Cov-2 infection and should not be used as the sole basis for treatment or other patient management decisions. A negative result may occur with  improper specimen collection/handling, submission of specimen other than nasopharyngeal swab, presence of viral mutation(s) within the areas targeted by this assay, and inadequate number of viral copies(<138 copies/mL). A negative result must be combined with clinical observations, patient history, and epidemiological information. The expected result is Negative.  Fact Sheet for Patients:  BloggerCourse.com  Fact Sheet for Healthcare Providers:  SeriousBroker.it  This test is no t yet approved or cleared by the [place]  FDA and  has been authorized for detection and/or diagnosis of SARS-CoV-2 by FDA under an Emergency Use Authorization (EUA).  This EUA will remain  in effect (meaning this test can be used) for the duration of the COVID-19 declaration under Section 564(b)(1) of the Act, 21 U.S.C.section 360bbb-3(b)(1), unless the authorization is terminated  or revoked sooner.       Influenza A by PCR NEGATIVE NEGATIVE Final   Influenza B by PCR NEGATIVE NEGATIVE Final    Comment: (NOTE) The Xpert Xpress SARS-CoV-2/FLU/RSV plus assay is intended as an aid in the  diagnosis of influenza from Nasopharyngeal swab specimens and should not be used as a sole basis for treatment. Nasal washings and aspirates are unacceptable for Xpert Xpress SARS-CoV-2/FLU/RSV testing.  Fact Sheet for Patients: BloggerCourse.com  Fact Sheet for Healthcare Providers: SeriousBroker.it  This test is not yet approved or cleared by the [place]  FDA and has been authorized for detection and/or diagnosis of SARS-CoV-2 by FDA under an Emergency Use Authorization (EUA). This EUA will remain in effect (meaning this test can be used) for the duration of the COVID-19 declaration under Section 564(b)(1) of the Act, 21 U.S.C. section 360bbb-3(b)(1), unless the authorization is terminated or revoked.     Resp Syncytial Virus by PCR NEGATIVE NEGATIVE Final    Comment: (NOTE) Fact Sheet for Patients: BloggerCourse.com  Fact Sheet for Healthcare Providers: SeriousBroker.it  This test is not yet approved or cleared by the [place]  FDA and has been authorized for detection and/or diagnosis of SARS-CoV-2 by FDA under an Emergency Use Authorization (EUA). This EUA will remain in effect (meaning this test can be used) for the duration of the COVID-19 declaration under Section 564(b)(1) of the Act, 21 U.S.C. section 360bbb-3(b)(1), unless the authorization is terminated or revoked.  Performed at [facility], [address] [place], [place] [address]   Blood Culture ID Panel (Reflexed)     Status: Abnormal   Collection Time: 10/28/23 11:14 AM  Result Value Ref Range Status   Enterococcus faecalis NOT DETECTED NOT DETECTED Final   Enterococcus Faecium NOT DETECTED NOT DETECTED Final   Listeria monocytogenes NOT DETECTED NOT DETECTED Final   Staphylococcus species DETECTED (A) NOT DETECTED Final    Comment: CRITICAL RESULT CALLED TO, READ BACK BY AND VERIFIED  WITH: Lafrances Pigeon [facility] 1914 10/29/23 HNM    Staphylococcus aureus (BCID) NOT DETECTED NOT DETECTED Final   Staphylococcus epidermidis DETECTED (A) NOT DETECTED Final    Comment: CRITICAL RESULT CALLED TO, READ BACK BY AND VERIFIED WITH: Lafrances Pigeon [facility] 7829 10/29/23 HNM    Staphylococcus lugdunensis NOT DETECTED NOT DETECTED Final   Streptococcus species NOT DETECTED NOT DETECTED Final   Streptococcus agalactiae NOT DETECTED NOT DETECTED Final   Streptococcus pneumoniae NOT DETECTED NOT DETECTED Final   Streptococcus pyogenes NOT DETECTED NOT DETECTED Final   A.calcoaceticus-baumannii NOT DETECTED NOT DETECTED Final   Bacteroides fragilis NOT DETECTED NOT DETECTED Final   Enterobacterales NOT DETECTED NOT DETECTED Final   Enterobacter cloacae complex NOT DETECTED NOT DETECTED Final   Escherichia coli NOT DETECTED NOT DETECTED Final   Klebsiella aerogenes NOT DETECTED NOT DETECTED Final   Klebsiella oxytoca NOT DETECTED NOT DETECTED Final   Klebsiella pneumoniae NOT DETECTED NOT DETECTED Final   Proteus species NOT DETECTED NOT DETECTED Final   Salmonella species NOT DETECTED NOT DETECTED Final   Serratia marcescens NOT DETECTED NOT DETECTED Final   Haemophilus influenzae NOT DETECTED NOT DETECTED Final   Neisseria meningitidis NOT DETECTED NOT DETECTED Final   Pseudomonas aeruginosa NOT DETECTED NOT DETECTED Final   Stenotrophomonas maltophilia NOT DETECTED NOT  DETECTED Final   Candida albicans NOT DETECTED NOT DETECTED Final   Candida auris NOT DETECTED NOT DETECTED Final   Candida glabrata NOT DETECTED NOT DETECTED Final   Candida krusei NOT DETECTED NOT DETECTED Final   Candida parapsilosis NOT DETECTED NOT DETECTED Final   Candida tropicalis NOT DETECTED NOT DETECTED Final   Cryptococcus neoformans/gattii NOT DETECTED NOT DETECTED Final   Methicillin resistance mecA/C NOT DETECTED NOT DETECTED Final    Comment: Performed at [facility], [address]  [place], [place] [address]  C Difficile Quick Screen w PCR reflex     Status: None   Collection Time: 10/29/23  5:42 PM   Specimen: STOOL  Result Value Ref Range Status   C Diff antigen NEGATIVE NEGATIVE Final   C Diff toxin NEGATIVE NEGATIVE Final   C Diff interpretation No C. difficile detected.  Final    Comment: Performed at [facility] [address] [place], [place] [address]  Gastrointestinal Panel by PCR , Stool     Status: None   Collection Time: 10/29/23  5:42 PM   Specimen: STOOL  Result Value Ref Range Status   Campylobacter species NOT DETECTED NOT DETECTED Final   Plesimonas shigelloides NOT DETECTED NOT DETECTED Final   Salmonella species NOT DETECTED NOT DETECTED Final   Yersinia enterocolitica NOT DETECTED NOT DETECTED Final   Vibrio species NOT DETECTED NOT DETECTED Final   Vibrio cholerae NOT DETECTED NOT DETECTED Final   Enteroaggregative E coli (EAEC) NOT DETECTED NOT DETECTED Final   Enteropathogenic E coli (EPEC) NOT DETECTED NOT DETECTED Final   Enterotoxigenic E coli (ETEC) NOT DETECTED NOT DETECTED Final   Shiga like toxin producing E coli (STEC) NOT DETECTED NOT DETECTED Final   Shigella/Enteroinvasive E coli (EIEC) NOT DETECTED NOT DETECTED Final   Cryptosporidium NOT DETECTED NOT DETECTED Final   Cyclospora cayetanensis NOT DETECTED NOT DETECTED Final   Entamoeba histolytica NOT DETECTED NOT DETECTED Final   Giardia lamblia NOT DETECTED NOT DETECTED Final   Adenovirus F40/41 NOT DETECTED NOT DETECTED Final   Astrovirus NOT DETECTED NOT DETECTED Final   Norovirus GI/GII NOT DETECTED NOT DETECTED Final   Rotavirus A NOT DETECTED NOT DETECTED Final   Sapovirus (I, II, IV, and V) NOT DETECTED NOT DETECTED Final    Comment: Performed at [facility], [address] [place], [place] [address]  Culture, blood (single) w Reflex to ID Panel     Status: None   Collection Time: 10/29/23  5:55 PM   Specimen: BLOOD  Result Value Ref Range Status    Specimen Description BLOOD LEFT ANTECUBITAL  Final   Special Requests   Final    BOTTLES DRAWN AEROBIC AND ANAEROBIC Blood Culture adequate volume   Culture   Final    NO GROWTH 5 DAYS Performed at [facility], [address] [place], [place] [address]    Report Status 11/03/2023 FINAL  Final  Respiratory (~20 pathogens) panel by PCR     Status: None   Collection Time: 10/31/23  5:50 PM   Specimen: Nasopharyngeal Swab; Respiratory  Result Value Ref Range Status   Adenovirus NOT DETECTED NOT DETECTED Final   Coronavirus 229E NOT DETECTED NOT DETECTED Final    Comment: (NOTE) The Coronavirus on the Respiratory Panel, DOES NOT test for the novel  Coronavirus (2019 nCoV)    Coronavirus HKU1 NOT DETECTED NOT DETECTED Final   Coronavirus NL63 NOT DETECTED NOT DETECTED Final   Coronavirus OC43 NOT  DETECTED NOT DETECTED Final   Metapneumovirus NOT DETECTED NOT DETECTED Final   Rhinovirus / Enterovirus NOT DETECTED NOT DETECTED Final   Influenza A NOT DETECTED NOT DETECTED Final   Influenza B NOT DETECTED NOT DETECTED Final   Parainfluenza Virus 1 NOT DETECTED NOT DETECTED Final   Parainfluenza Virus 2 NOT DETECTED NOT DETECTED Final   Parainfluenza Virus 3 NOT DETECTED NOT DETECTED Final   Parainfluenza Virus 4 NOT DETECTED NOT DETECTED Final   Respiratory Syncytial Virus NOT DETECTED NOT DETECTED Final   Bordetella pertussis NOT DETECTED NOT DETECTED Final   Bordetella Parapertussis NOT DETECTED NOT DETECTED Final   Chlamydophila pneumoniae NOT DETECTED NOT DETECTED Final   Mycoplasma pneumoniae NOT DETECTED NOT DETECTED Final    Comment: Performed at [facility], [address], [place], [place] [address]  Culture, blood (Routine X 2) w Reflex to ID Panel     Status: None (Preliminary result)   Collection Time: 10/31/23  7:44 PM   Specimen: BLOOD RIGHT ARM  Result Value Ref Range Status   Specimen Description BLOOD RIGHT ARM  Final   Special Requests   Final     BOTTLES DRAWN AEROBIC AND ANAEROBIC Blood Culture adequate volume   Culture   Final    NO GROWTH 4 DAYS Performed at [facility], [address], [place], [place] [address]    Report Status PENDING  Incomplete  C Difficile Quick Screen w PCR reflex     Status: None   Collection Time: 11/01/23 12:36 AM   Specimen: STOOL  Result Value Ref Range Status   C Diff antigen NEGATIVE NEGATIVE Final   C Diff toxin NEGATIVE NEGATIVE Final   C Diff interpretation No C. difficile detected.  Final    Comment: Performed at [facility], [address], [place], [place] [address]  Gastrointestinal Panel by PCR , Stool     Status: None   Collection Time: 11/01/23 12:36 AM   Specimen: STOOL  Result Value Ref Range Status   Campylobacter species NOT DETECTED NOT DETECTED Final   Plesimonas shigelloides NOT DETECTED NOT DETECTED Final   Salmonella species NOT DETECTED NOT DETECTED Final   Yersinia enterocolitica NOT DETECTED NOT DETECTED Final   Vibrio species NOT DETECTED NOT DETECTED Final   Vibrio cholerae NOT DETECTED NOT DETECTED Final   Enteroaggregative E coli (EAEC) NOT DETECTED NOT DETECTED Final   Enteropathogenic E coli (EPEC) NOT DETECTED NOT DETECTED Final   Enterotoxigenic E coli (ETEC) NOT DETECTED NOT DETECTED Final   Shiga like toxin producing E coli (STEC) NOT DETECTED NOT DETECTED Final   Shigella/Enteroinvasive E coli (EIEC) NOT DETECTED NOT DETECTED Final   Cryptosporidium NOT DETECTED NOT DETECTED Final   Cyclospora cayetanensis NOT DETECTED NOT DETECTED Final   Entamoeba histolytica NOT DETECTED NOT DETECTED Final   Giardia lamblia NOT DETECTED NOT DETECTED Final   Adenovirus F40/41 NOT DETECTED NOT DETECTED Final   Astrovirus NOT DETECTED NOT DETECTED Final   Norovirus GI/GII NOT DETECTED NOT DETECTED Final   Rotavirus A NOT DETECTED NOT DETECTED Final   Sapovirus (I, II, IV, and V) NOT DETECTED NOT DETECTED Final    Comment: Performed at [facility],  [address] [place], [place] [address]    RADIOLOGY STUDIES/RESULTS: No results found.    LOS: 4 days   Kimberly Penna, MD  [facility]    To contact the attending provider between 7A-7P or the covering provider during after  hours 7P-7A, please log into the web site [email or web address] and access using universal [place] password for that web site. If you do not have the password, please call the hospital operator.  11/04/2023, 11:05 AM

## 2023-11-05 ENCOUNTER — Other Ambulatory Visit (HOSPITAL_COMMUNITY): Payer: Self-pay

## 2023-11-05 ENCOUNTER — Encounter: Payer: Self-pay | Admitting: Oncology

## 2023-11-05 DIAGNOSIS — F1090 Alcohol use, unspecified, uncomplicated: Secondary | ICD-10-CM | POA: Diagnosis not present

## 2023-11-05 DIAGNOSIS — G9341 Metabolic encephalopathy: Secondary | ICD-10-CM | POA: Diagnosis not present

## 2023-11-05 DIAGNOSIS — K7031 Alcoholic cirrhosis of liver with ascites: Secondary | ICD-10-CM | POA: Diagnosis not present

## 2023-11-05 DIAGNOSIS — D649 Anemia, unspecified: Secondary | ICD-10-CM | POA: Diagnosis not present

## 2023-11-05 LAB — CULTURE, BLOOD (ROUTINE X 2)
Culture: NO GROWTH
Special Requests: ADEQUATE

## 2023-11-05 LAB — CBC
HCT: 35.3 % — ABNORMAL LOW (ref 39.0–52.0)
Hemoglobin: 12.3 g/dL — ABNORMAL LOW (ref 13.0–17.0)
MCH: 33.2 pg (ref 26.0–34.0)
MCHC: 34.8 g/dL (ref 30.0–36.0)
MCV: 95.4 fL (ref 80.0–100.0)
Platelets: 169 10*3/uL (ref 150–400)
RBC: 3.7 MIL/uL — ABNORMAL LOW (ref 4.22–5.81)
RDW: 14.6 % (ref 11.5–15.5)
WBC: 8.4 10*3/uL (ref 4.0–10.5)
nRBC: 0 % (ref 0.0–0.2)

## 2023-11-05 LAB — GLUCOSE, CAPILLARY: Glucose-Capillary: 168 mg/dL — ABNORMAL HIGH (ref 70–99)

## 2023-11-05 MED ORDER — LEVETIRACETAM 750 MG PO TABS
1500.0000 mg | ORAL_TABLET | Freq: Two times a day (BID) | ORAL | 1 refills | Status: DC
  Filled 2023-11-05: qty 120, 30d supply, fill #0

## 2023-11-05 MED ORDER — NOVOLOG FLEXPEN 100 UNIT/ML ~~LOC~~ SOPN: PEN_INJECTOR | SUBCUTANEOUS | Status: AC

## 2023-11-05 MED ORDER — ASPIRIN 81 MG PO CHEW
81.0000 mg | CHEWABLE_TABLET | Freq: Every day | ORAL | 1 refills | Status: AC
  Filled 2023-11-05: qty 30, 30d supply, fill #0

## 2023-11-05 MED ORDER — METFORMIN HCL 500 MG PO TABS
500.0000 mg | ORAL_TABLET | Freq: Two times a day (BID) | ORAL | 1 refills | Status: DC
  Filled 2023-11-05: qty 60, 30d supply, fill #0

## 2023-11-05 NOTE — Progress Notes (Signed)
 Spoke to daughter about /c said she would be here around 1130 pt will be sent to d/c lounge TOC meds ready

## 2023-11-05 NOTE — Plan of Care (Signed)
  Problem: Education: Goal: Knowledge of General Education information will improve Description: Including pain rating scale, medication(s)/side effects and non-pharmacologic comfort measures Outcome: Progressing   Problem: Clinical Measurements: Goal: Will remain free from infection Outcome: Progressing Goal: Diagnostic test results will improve Outcome: Progressing   Problem: Activity: Goal: Risk for activity intolerance will decrease Outcome: Progressing   Problem: Nutrition: Goal: Adequate nutrition will be maintained Outcome: Progressing   Problem: Coping: Goal: Level of anxiety will decrease Outcome: Progressing   Problem: Safety: Goal: Ability to remain free from injury will improve Outcome: Progressing

## 2023-11-05 NOTE — Progress Notes (Signed)
   11/05/23 1020  Mobility  Activity Ambulated with assistance in hallway  Level of Assistance Contact guard assist, steadying assist  Assistive Device Front wheel walker  Distance Ambulated (ft) 50 ft  Activity Response Tolerated well  Mobility Referral Yes  Mobility visit 1 Mobility  Mobility Specialist Start Time (ACUTE ONLY) 1020  Mobility Specialist Stop Time (ACUTE ONLY) 1025  Mobility Specialist Time Calculation (min) (ACUTE ONLY) 5 min   Mobility Specialist: Progress Note  During Mobility: HR 93, SpO2 96-97%  Pt agreeable to mobility session - received in straight back chair. Pt was asymptomatic throughout session with no complaints. Returned to straight back chair with all needs met - call bell within reach.  [name], BS Mobility Specialist Please contact via SecureChat or  Rehab office at [phone].

## 2023-11-05 NOTE — TOC Transition Note (Signed)
 Transition of Care [facility]) - Discharge Note   Patient Details  Name: [name] MRN: [id number] Date of Birth: Jan 22, 1951  Transition of [facility]) CM/SW Contact:  Eusebio High, RN Phone Number: 11/05/2023, 9:09 AM   Clinical Narrative:     Patient will DC to home today . Home Health arranged and will be provided by [facility]. AVS updated  Patient has recommended DME at home. Wife will transport. No additional TOC needs      Barriers to Discharge: Continued Medical Work up   Patient Goals and CMS Choice Patient states their goals for this hospitalization and ongoing recovery are:: agreeable to [facility] CMS Medicare.gov Compare Post Acute Care list provided to:: Patient Choice offered to / list presented to : Patient      Discharge Placement                       Discharge Plan and Services Additional resources added to the After Visit Summary for                                       Social Drivers of Health (SDOH) Interventions SDOH Screenings   Food Insecurity: No Food Insecurity (10/31/2023)  Housing: Low Risk  (10/31/2023)  Transportation Needs: No Transportation Needs (10/31/2023)  Utilities: Not At Risk (10/31/2023)  Social Connections: Moderately Integrated (10/31/2023)  Tobacco Use: Medium Risk (10/31/2023)     Readmission Risk Interventions     No data to display

## 2023-11-05 NOTE — Discharge Summary (Signed)
 PATIENT DETAILS Name: [name] Age: 73 y.o. Sex: male Date of Birth: [birth year] MRN: [id number]. Admitting Physician: Johnetta Nab, MD WUJ:[id number], Erlinda Haws, MD  Admit Date: 10/31/2023 Discharge date: 11/05/2023  Recommendations for Outpatient Follow-up:  Follow up with PCP in 1-2 weeks Please obtain CMP/CBC in one week Ensure follow-up with cardiology/neurology.  Admitted From:  Home  Disposition: Home   Discharge Condition: good  CODE STATUS:   Code Status: Full Code   Diet recommendation:  Diet Order             Diet - low sodium heart healthy           Diet Carb Modified           Diet heart healthy/carb modified Room service appropriate? Yes; Fluid consistency: Thin; Fluid restriction: 1800 mL Fluid  Diet effective now                    Brief Summary: Patient is a 73 y.o.  male with history of EtOH use, HTN, DM-2, liver cirrhosis-who was admitted to [facility] for acute metabolic encephalopathy likely in the setting of new onset seizures-upon further evaluation-patient was also found to have aspiration pneumonia-MRI revealed acute CVA.  Patient was transferred to [facility] for LTM EEG.   Significant events: 4/24>> transferred to [facility] from [facility] for LTM EEG.   Significant studies: 4/21>> UDS: Negative 4/21>> A1c: 5.7 4/21>> CT head: No acute intracranial abnormality 4/21>> CT abdomen/pelvis: Patchy airspace opacities of both lungs, severe hepatic steatosis with contour irregularity of the liver suspicious for cirrhosis. 4/22>> MRI brain: Right cerebellar subacute infarction-no hemorrhage (addended report) 4/22>> Spot EEG: Left fronto-central seizure lasting 8 minutes. 4/23>> CT angio head/neck: No LVO or significant stenosis 4/24>> LDL: 15 4/24-4/25>> LTM EEG: No seizures. 4/25>> echo: EF 55-60%, grade 1 diastolic dysfunction. 4/26-4/27>> LTM EEG: Negative   Significant microbiology data: 4/21>> blood culture: Staph epidermidis (contamination) 4/22>>  blood culture: Negative 4/24>> respiratory virus panel: Negative 4/24>> blood culture: No growth 4/25>> stool C. difficile PCR: Negative   Procedures: None   Consults: Neuro  Brief Hospital Course: Acute metabolic encephalopathy Secondary to sepsis physiology from pneumonia and seizure LTM EEG negative  Neuroimaging as above Mental status continues to improve-completely awake/alert this morning.   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 Standard seizure precautions Patient aware of driving restrictions. Outpatient referral to neurology sent via epic.   Acute ischemic CVA No focal deficits Workup as above Etiology thought to be embolic Continue aspirin  Discussed with cardiology regarding outpatient cardiac monitor-epic message sent as well. Outpatient referral to neurology sent via epic. Initially plans were for CIR-however patient has improved significantly-updated recommendations are for home health.   Sepsis secondary to presumed aspiration pneumonia Clinically improved  Repeat cultures negative Was on Unasyn-has been switched to Augmentin -this will be continued for a few more days.   Hypokalemia/hypophosphatemia/hypomagnesemia Related to EtOH use Repleted.   HTN BP currently stable-with the use of any antihypertensives.   DM-2 CBG stable Managed with SSI during this hospitalization  Spoke with spouse on 4/29-apparently on 20 units of Tresiba  at home-he apparently has been having some hypoglycemic episodes at home Discussed with spouse-he has NovoLog  sliding scale regimen which will be continued-I will plan to hold Tresiba  and put him on metformin .  Follow-up with PCP for further optimization.  Liver cirrhosis Thrombocytopenia likely secondary to  hypersplenism Stable Thankfully no further fever-doubt any role for diagnostic paracentesis-does not have any  significant ascites on exam.   Alcoholic hepatitis Significant improvement in transaminitis Follow LFTs periodically   EtOH use Awake/alert No major symptoms of alcohol withdrawal during this hospitalization He was managed with Ativan  per CIWA protocol Extensive counseling done regarding importance of quitting further alcohol use.   GERD PPI   Chronic opiate dependence/chronic pain syndrome Continue as needed oxycodone    Mood disorder Appears stable Zoloft    BMI: Estimated body mass index is 24.59 kg/m as calculated from the following:   Height as of this encounter: 5\' 6"  (1.676 m).   Weight as of this encounter: 69.1 kg.   Discharge Diagnoses:  Principal Problem:   Acute encephalopathy Active Problems:   Aspiration pneumonia (HCC)   Alcoholic cirrhosis of liver with ascites (HCC)   HTN (hypertension)   GERD (gastroesophageal reflux disease)   DM (diabetes mellitus) (HCC)   Anemia   Depression   Obese   Alcohol use   CVA (cerebral vascular accident) [facility])   Discharge Instructions:  Activity:  As tolerated with Full fall precautions use walker/cane & assistance as needed   Discharge Instructions     Ambulatory referral to Neurology   Complete by: As directed    An appointment is requested in approximately: 6 wks   Call MD for:  difficulty breathing, headache or visual disturbances   Complete by: As directed    Call MD for:  persistant nausea and vomiting   Complete by: As directed    Call MD for:  severe uncontrolled pain   Complete by: As directed    Diet - low sodium heart healthy   Complete by: As directed    Diet Carb Modified   Complete by: As directed    Discharge instructions   Complete by: As directed    Follow with Primary MD  Lamon Pillow, MD in 1-2 weeks  Follow-up with neurology clinic-they will give you a call-if you do not hear from them-please give them a call  You will get a call from cardiology to get an outpatient heart  monitor arranged.  No further alcohol use   Please get a complete blood count and chemistry panel checked by your Primary MD at your next visit, and again as instructed by your Primary MD.  Get Medicines reviewed and adjusted: Please take all your medications with you for your next visit with your Primary MD  Laboratory/radiological data: Please request your Primary MD to go over all hospital tests and procedure/radiological results at the follow up, please ask your Primary MD to get all Hospital records sent to his/her office.  In some cases, they will be blood work, cultures and biopsy results pending at the time of your discharge. Please request that your primary care M.D. follows up on these results.  Also Note the following: If you experience worsening of your admission symptoms, develop shortness of breath, life threatening emergency, suicidal or homicidal thoughts you must seek medical attention immediately by calling 911 or calling your MD immediately  if symptoms less severe.  You must read complete instructions/literature along with all the possible adverse reactions/side effects for all the Medicines you take and that have been prescribed to you. Take any new Medicines after you have completely understood and accpet all the possible adverse reactions/side effects.   Do not drive when taking Pain medications or sleeping medications (Benzodaizepines)  Do not take more than prescribed Pain, Sleep  and Anxiety Medications. It is not advisable to combine anxiety,sleep and pain medications without talking with your primary care practitioner  Special Instructions: If you have smoked or chewed Tobacco  in the last 2 yrs please stop smoking, stop any regular Alcohol  and or any Recreational drug use.  Wear Seat belts while driving.  Please note: You were cared for by a hospitalist during your hospital stay. Once you are discharged, your primary care physician will handle any further  medical issues. Please note that NO REFILLS for any discharge medications will be authorized once you are discharged, as it is imperative that you return to your primary care physician (or establish a relationship with a primary care physician if you do not have one) for your post hospital discharge needs so that they can reassess your need for medications and monitor your lab values.     Seizure precautions: Per [place]  DMV statutes, patients with seizures are not allowed to drive until they have been seizure-free for six months and cleared by a physician    Use caution when using heavy equipment or power tools. Avoid working on ladders or at heights. Take showers instead of baths. Ensure the water temperature is not too high on the home water heater. Do not go swimming alone. Do not lock yourself in a room alone (i.e. bathroom). When caring for infants or small children, sit down when holding, feeding, or changing them to minimize risk of injury to the child in the event you have a seizure. Maintain good sleep hygiene. Avoid alcohol.    If patient has another seizure, call 911 and bring them back to the ED if: A.  The seizure lasts longer than 5 minutes.      B.  The patient doesn't wake shortly after the seizure or has new problems such as difficulty seeing, speaking or moving following the seizure C.  The patient was injured during the seizure D.  The patient has a temperature over 102 F (39C) E.  The patient vomited during the seizure and now is having trouble breathing    During the Seizure   - First, ensure adequate ventilation and place patients on the floor on their left side  Loosen clothing around the neck and ensure the airway is patent. If the patient is clenching the teeth, do not force the mouth open with any object as this can cause severe damage - Remove all items from the surrounding that can be hazardous. The patient may be oblivious to what's happening and may not even  know what he or she is doing. If the patient is confused and wandering, either gently guide him/her away and block access to outside areas - Reassure the individual and be comforting - Call 911. In most cases, the seizure ends before EMS arrives. However, there are cases when seizures may last over 3 to 5 minutes. Or the individual may have developed breathing difficulties or severe injuries. If a pregnant patient or a person with diabetes develops a seizure, it is prudent to call an ambulance. - Finally, if the patient does not regain full consciousness, then call EMS. Most patients will remain confused for about 45 to 90 minutes after a seizure, so you must use judgment in calling for help. - Avoid restraints but make sure the patient is in a bed with padded side rails - Place the individual in a lateral position with the neck slightly flexed; this will help the saliva drain from the mouth  and prevent the tongue from falling backward - Remove all nearby furniture and other hazards from the area - Provide verbal assurance as the individual is regaining consciousness - Provide the patient with privacy if possible - Call for help and start treatment as ordered by the caregiver    After the Seizure (Postictal Stage)   After a seizure, most patients experience confusion, fatigue, muscle pain and/or a headache. Thus, one should permit the individual to sleep. For the next few days, reassurance is essential. Being calm and helping reorient the person is also of importance.   Most seizures are painless and end spontaneously. Seizures are not harmful to others but can lead to complications such as stress on the lungs, brain and the heart. Individuals with prior lung problems may develop labored breathing and respiratory distress.    Increase activity slowly   Complete by: As directed       Allergies as of 11/05/2023       Reactions   Hydrochlorothiazide Other (See Comments)   Hyponatremia - High  sodium in the blood        Medication List     STOP taking these medications    oxyCODONE  5 MG immediate release tablet Commonly known as: Oxy IR/ROXICODONE    Tresiba  FlexTouch 100 UNIT/ML FlexTouch Pen Generic drug: insulin  degludec       TAKE these medications    acetaminophen  500 MG tablet Commonly known as: TYLENOL  Take 1,000 mg by mouth every 6 (six) hours as needed for mild pain (pain score 1-3).   allopurinol  100 MG tablet Commonly known as: ZYLOPRIM  Take 100 mg by mouth daily.   aspirin  81 MG chewable tablet Chew 1 tablet (81 mg total) by mouth daily. Start taking on: November 06, 2023   clobetasol 0.05 % external solution Commonly known as: TEMOVATE Apply 1 Application topically 2 (two) times daily as needed.   folic acid  1 MG tablet Commonly known as: FOLVITE  Take 1 tablet (1 mg total) by mouth daily.   levETIRAcetam  750 MG tablet Commonly known as: KEPPRA  Take 2 tablets (1,500 mg total) by mouth 2 (two) times daily.   metFORMIN  500 MG tablet Commonly known as: GLUCOPHAGE  Take 1 tablet (500 mg total) by mouth 2 (two) times daily with a meal.   mupirocin  ointment 2 % Commonly known as: BACTROBAN  Apply 1 Application topically daily.   NovoLOG  FlexPen 100 UNIT/ML FlexPen Generic drug: insulin  aspart 0-9 Units, Subcutaneous, 3 times daily with meals CBG < 70: Implement Hypoglycemia measures CBG 70 - 120: 0 units CBG 121 - 150: 1 unit CBG 151 - 200: 2 units CBG 201 - 250: 3 units CBG 251 - 300: 5 units CBG 301 - 350: 7 units CBG 351 - 400: 9 units CBG > 400: call MD   sertraline  100 MG tablet Commonly known as: ZOLOFT  Take 100 mg by mouth daily.   thiamine  100 MG tablet Commonly known as: Vitamin B-1 Take 1 tablet (100 mg total) by mouth daily.   traMADol  50 MG tablet Commonly known as: ULTRAM  Take 50 mg by mouth every 8 (eight) hours as needed for moderate pain (pain score 4-6).   zinc sulfate (50mg  elemental zinc) 220 (50 Zn) MG capsule Take  220 mg by mouth daily.        Follow-up Information     [facility] Follow up.   Why: Lennart Quitter will contact you within 48 hours of discharge to arrange a home health visit Contact  information: [address] [place] [address] [phone]                Allergies  Allergen Reactions   Hydrochlorothiazide Other (See Comments)    Hyponatremia - High sodium in the blood     Other Procedures/Studies: ECHOCARDIOGRAM COMPLETE Result Date: 11/01/2023    ECHOCARDIOGRAM REPORT   Patient Name:   Inmar Laplume Date of Exam: 11/01/2023 Medical Rec #:  [id number]  Height:       66.0 in Accession #:    [id number] Weight:       140.0 lb Date of Birth:  13-Jan-1951  BSA:          1.718 m Patient Age:    73 years   BP:           105/67 mmHg Patient Gender: M          HR:           85 bpm. Exam Location:  Inpatient Procedure: 2D Echo, Cardiac Doppler, Color Doppler and Intracardiac            Opacification Agent (Both Spectral and Color Flow Doppler were            utilized during procedure). Indications:     Stroke  History:         Patient has no prior history of Echocardiogram examinations.                  Stroke; Risk Factors:Hypertension and Diabetes.  Sonographer:     [name] Shaw Referring Phys:  MCNEILL Annah Barre Diagnosing Phys: Archer Bear  Sonographer Comments: Image acquisition challenging due to patient body habitus and Image acquisition challenging due to respiratory motion. IMPRESSIONS  1. Left ventricular ejection fraction, by estimation, is 55 to 60%. The left ventricle has normal function. Left ventricular endocardial border not optimally defined to evaluate regional wall motion. Left ventricular diastolic parameters are consistent with Grade I diastolic dysfunction (impaired relaxation).  2. Right ventricular systolic function is normal. The right ventricular size is normal.  3. The mitral valve is degenerative. Trivial mitral valve  regurgitation. No evidence of mitral stenosis. The mean mitral valve gradient is 2.0 mmHg. Moderate mitral annular calcification.  4. The aortic valve is tricuspid. There is severe calcifcation of the aortic valve. Aortic valve regurgitation is not visualized. Mild aortic valve stenosis. Aortic valve mean gradient measures 13.0 mmHg.  5. The inferior vena cava is normal in size with greater than 50% respiratory variability, suggesting right atrial pressure of 3 mmHg.  6. Technically difficult study with poor acoustic windows. FINDINGS  Left Ventricle: Left ventricular ejection fraction, by estimation, is 55 to 60%. The left ventricle has normal function. Left ventricular endocardial border not optimally defined to evaluate regional wall motion. The left ventricular internal cavity size was normal in size. There is no left ventricular hypertrophy. Left ventricular diastolic parameters are consistent with Grade I diastolic dysfunction (impaired relaxation). Right Ventricle: The right ventricular size is normal. No increase in right ventricular wall thickness. Right ventricular systolic function is normal. Left Atrium: Left atrial size was normal in size. Right Atrium: Right atrial size was normal in size. Pericardium: There is no evidence of pericardial effusion. Mitral Valve: The mitral valve is degenerative in appearance. There is mild calcification of the mitral valve leaflet(s). Moderate mitral annular calcification. Trivial mitral valve regurgitation. No evidence of mitral valve stenosis. MV peak gradient, 4.8 mmHg. The mean mitral valve gradient is 2.0 mmHg.  Tricuspid Valve: The tricuspid valve is normal in structure. Tricuspid valve regurgitation is trivial. Aortic Valve: The aortic valve is tricuspid. There is severe calcifcation of the aortic valve. Aortic valve regurgitation is not visualized. Mild aortic stenosis is present. Aortic valve mean gradient measures 13.0 mmHg. Aortic valve peak gradient measures  26.0 mmHg. Aortic valve area, by VTI measures 2.41 cm. Pulmonic Valve: The pulmonic valve was normal in structure. Pulmonic valve regurgitation is not visualized. Aorta: The aortic root is normal in size and structure. Venous: The inferior vena cava is normal in size with greater than 50% respiratory variability, suggesting right atrial pressure of 3 mmHg. IAS/Shunts: No atrial level shunt detected by color flow Doppler.  LEFT VENTRICLE PLAX 2D LVIDd:         4.00 cm      Diastology LVIDs:         2.50 cm      LV e' medial:    4.57 cm/s LV PW:         1.00 cm      LV E/e' medial:  15.3 LV IVS:        0.80 cm      LV e' lateral:   4.68 cm/s LVOT diam:     2.10 cm      LV E/e' lateral: 15.0 LV SV:         75 LV SV Index:   44 LVOT Area:     3.46 cm  LV Volumes (MOD) LV vol d, MOD A4C: 106.0 ml LV vol s, MOD A4C: 27.0 ml LV SV MOD A4C:     106.0 ml RIGHT VENTRICLE             IVC RV Basal diam:  3.40 cm     IVC diam: 1.00 cm RV Mid diam:    2.40 cm RV S prime:     13.30 cm/s LEFT ATRIUM           Index        RIGHT ATRIUM          Index LA diam:      4.00 cm 2.33 cm/m   RA Area:     7.43 cm LA Vol (A4C): 37.3 ml 21.71 ml/m  RA Volume:   11.40 ml 6.63 ml/m  AORTIC VALVE                     PULMONIC VALVE AV Area (Vmax):    1.79 cm      PV Vmax:       1.50 m/s AV Area (Vmean):   2.04 cm      PV Peak grad:  8.9 mmHg AV Area (VTI):     2.41 cm AV Vmax:           254.80 cm/s AV Vmean:          151.000 cm/s AV VTI:            0.310 m AV Peak Grad:      26.0 mmHg AV Mean Grad:      13.0 mmHg LVOT Vmax:         132.00 cm/s LVOT Vmean:        88.800 cm/s LVOT VTI:          0.216 m LVOT/AV VTI ratio: 0.70  AORTA Ao Root diam: 3.20 cm Ao Asc diam:  3.60 cm MITRAL VALVE MV Area (PHT): 2.40 cm     SHUNTS MV Area  VTI:   3.87 cm     Systemic VTI:  0.22 m MV Peak grad:  4.8 mmHg     Systemic Diam: 2.10 cm MV Mean grad:  2.0 mmHg MV Vmax:       1.10 m/s MV Vmean:      65.5 cm/s MV Decel Time: 316 msec MV E velocity: 70.00  cm/s MV A velocity: 124.00 cm/s MV E/A ratio:  0.56 Dalton McleanMD Electronically signed by Archer Bear Signature Date/Time: 11/01/2023/9:38:22 AM    Final (Updated)    Overnight EEG with video Result Date: 11/01/2023 Arleene Lack, MD     11/02/2023  4:31 AM Patient Name: Kionte Surman MRN: [id number] Epilepsy Attending: Arleene Lack Referring Physician/Provider: Eleni Griffin, MD Duration: 10/31/2023 2105 to 11/02/2023 0415 Patient history: 73yo M with intermittent confusion. EEG to evaluate for seizure Level of alertness: Awake, asleep AEDs during EEG study: LEV Technical aspects: This EEG study was done with scalp electrodes positioned according to the 10-20 International system of electrode placement. Electrical activity was reviewed with band pass filter of 1-70Hz , sensitivity of 7 uV/mm, display speed of 59mm/sec with a 60Hz  notched filter applied as appropriate. EEG data were recorded continuously and digitally stored.  Video monitoring was available and reviewed as appropriate. Description: The posterior dominant rhythm consists of 9-10 Hz activity of moderate voltage (25-35 uV) seen predominantly in posterior head regions, symmetric and reactive to eye opening and eye closing. There is intermittent generalized 3-5hz  theta-delta slowing admixed with 13 to 15 Hz beta activity distributed symmetrically and diffusely.  Hyperventilation and photic stimulation were not performed.   EEG was not interpretable between 0325 to 1034 due to significant electrode artifact ABNORMALITY - Intermittent slow, generalized IMPRESSION: This study is suggestive of mild diffuse encephalopathy. No seizures or epileptiform discharges were seen throughout the recording. Arleene Lack   DG CHEST PORT 1 VIEW Result Date: 10/31/2023 CLINICAL DATA:  [id number] Dyspnea [id number] EXAM: PORTABLE CHEST 1 VIEW COMPARISON:  Chest x-ray 10/29/2023, CT angio head and neck 10/30/2023 FINDINGS: The heart and mediastinal contours are  unchanged. Slightly worsened interstitial and airspace opacities most prominent along the right upper lung zone. Left base atelectasis. No pleural effusion. No pneumothorax. No acute osseous abnormality. Right upper quadrant surgical clips. IMPRESSION: Slightly worsened interstitial and airspace opacities most prominent along the right upper lung zone. Findings could represent a combination of infectious and/or edema. Electronically Signed   By: Morgane  Naveau M.D.   On: 10/31/2023 19:45   MR BRAIN WO CONTRAST Addendum Date: 10/30/2023 ADDENDUM REPORT: 10/30/2023 14:06 ADDENDUM: Subsequently obtained CTA of the head and neck shows that the area of abnormal diffusion weighted imaging in the right cerebellar hemisphere is more consistent with a subacute infarction. There is no hemorrhage. Electronically Signed   By: Juanetta Nordmann M.D.   On: 10/30/2023 14:06   Result Date: 10/30/2023 CLINICAL DATA:  Seizure disorder EXAM: MRI HEAD WITHOUT CONTRAST TECHNIQUE: Multiplanar, multiecho pulse sequences of the brain and surrounding structures were obtained without intravenous contrast. COMPARISON:  10/28/2023 FINDINGS: Brain: Intraparenchymal hemorrhage within the right cerebellum measuring 3.2 x 1.7 cm, new since the earlier head CT. Normal white matter signal, parenchymal volume and CSF spaces. The midline structures are normal. Vascular: Normal flow voids. Skull and upper cervical spine: Normal calvarium and skull base. Visualized upper cervical spine and soft tissues are normal. Sinuses/Orbits:No paranasal sinus fluid levels or advanced mucosal thickening. No mastoid or middle ear effusion. Normal orbits. IMPRESSION: Intraparenchymal hemorrhage  within the right cerebellum measuring 3.2 x 1.7 cm, new since the earlier head CT. Critical Value/emergent results were called by telephone at the time of interpretation on 10/29/2023 at 11:22 pm to provider [facility], who verbally acknowledged these results.  Electronically Signed: By: Juanetta Nordmann M.D. On: 10/29/2023 23:22   CT ANGIO HEAD NECK W WO CM Result Date: 10/30/2023 CLINICAL DATA:  Acute neurologic deficit EXAM: CT ANGIOGRAPHY HEAD AND NECK WITH AND WITHOUT CONTRAST TECHNIQUE: Multidetector CT imaging of the head and neck was performed using the standard protocol during bolus administration of intravenous contrast. Multiplanar CT image reconstructions and MIPs were obtained to evaluate the vascular anatomy. Carotid stenosis measurements (when applicable) are obtained utilizing NASCET criteria, using the distal internal carotid diameter as the denominator. RADIATION DOSE REDUCTION: This exam was performed according to the departmental dose-optimization program which includes automated exposure control, adjustment of the mA and/or kV according to patient size and/or use of iterative reconstruction technique. CONTRAST:  75mL OMNIPAQUE  IOHEXOL  350 MG/ML SOLN COMPARISON:  Brain MRI 10/29/2023 FINDINGS: CT HEAD FINDINGS Brain: There is actually no hemorrhage within the right cerebellar hemisphere. There is focal hypoattenuation consistent with a subacute infarct. No midline shift or other mass effect. Mild generalized volume loss Vascular: No hyperdense vessel or unexpected vascular calcification. Skull: The visualized skull base, calvarium and extracranial soft tissues are normal. Sinuses/Orbits: No fluid levels or advanced mucosal thickening of the visualized paranasal sinuses. No mastoid or middle ear effusion. Normal orbits. CTA NECK FINDINGS Skeleton: No acute abnormality or high grade bony spinal canal stenosis. Other neck: Normal pharynx, larynx and major salivary glands. No cervical lymphadenopathy. Unremarkable thyroid  gland. Upper chest: Biapical opacities, right greater than left. Aortic arch: There is no calcific atherosclerosis of the aortic arch. Conventional 3 vessel aortic branching pattern. RIGHT carotid system: Normal without aneurysm,  dissection or stenosis. LEFT carotid system: Normal without aneurysm, dissection or stenosis. Vertebral arteries: Codominant configuration. There is no dissection, occlusion or flow-limiting stenosis to the skull base (V1-V3 segments). CTA HEAD FINDINGS POSTERIOR CIRCULATION: Vertebral arteries are normal. No proximal occlusion of the anterior or inferior cerebellar arteries. Basilar artery is normal. Superior cerebellar arteries are normal. Posterior cerebral arteries are normal. ANTERIOR CIRCULATION: Atherosclerotic calcification of the internal carotid arteries at the skull base without hemodynamically significant stenosis. Anterior cerebral arteries are normal. Middle cerebral arteries are normal. Venous sinuses: As permitted by contrast timing, patent. Anatomic variants: None Review of the MIP images confirms the above findings. IMPRESSION: 1. No emergent large vessel occlusion or high-grade stenosis of the intracranial arteries. 2. 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 3. Biapical opacities, right greater than left, which could indicate infection or pulmonary edema. Electronically Signed   By: Juanetta Nordmann M.D.   On: 10/30/2023 02:48   EEG adult Result Date: 10/29/2023 Augustin Leber, MD     10/30/2023  2:44 PM History: 73 yo M preseting with seizure EEG duration: 28 minutes Sedation: None Patient State: Awake and drowsy Technique: This EEG was acquired with electrodes placed according to the International 10-20 electrode system (including Fp1, Fp2, F3, F4, C3, C4, P3, P4, O1, O2, T3, T4, T5, T6, A1, A2, Fz, Cz, Pz). The following electrodes were missing or displaced: none. Background: There is a posterior dominant rhythm seen at times with a frequency of 9 to 10 Hz.  In addition there is generalized irregular slow activity intruding into the background even during the maximal waking state.  During the recording, the EEG technician  noticed an abrupt confusional episode.  At that  time, they began the pattern of sharply contoured periodic discharges with a complex morphology beginning in the left frontocentral region.  This pattern continues and is associated with some rhythmic underlying slow activity which builds in amplitude before terminating.  There is significant movement artifact obscuring parts of the recording during this discharge.  He did have some movements of the left leg most resembling automatism, did not appear to be clonic activity during the discharges. Photic stimulation: Physiologic driving is not performed EEG Abnormalities: 1) left frontocentral seizure lasting approximately 8 minutes 2) generalized irregular slow activity Clinical Interpretation: This EEG recorded a partial seizure arising from the left frontocentral region without definite clinical correlate by video, but with reported confusion by EEG tech. Ann Keto, MD [facility] If 7pm- 7am, please page neurology on call as listed in AMION.  DG Chest 1 View Result Date: 10/29/2023 CLINICAL DATA:  Pneumonia. EXAM: CHEST  1 VIEW COMPARISON:  Radiograph yesterday FINDINGS: Worsening patchy bilateral airspace disease since yesterday. Stable heart size and mediastinal contours. No pleural fluid or pneumothorax. IMPRESSION: Worsening patchy bilateral airspace disease since yesterday, suspicious for worsening pneumonia. Electronically Signed   By: Chadwick Colonel M.D.   On: 10/29/2023 10:07   DG Knee 1-2 Views Left Result Date: 10/28/2023 CLINICAL DATA:  Knee pain. Technologist notes state anterior wound of left knee. EXAM: LEFT KNEE - 1-2 VIEW COMPARISON:  None Available. FINDINGS: The bones are subjectively under mineralized. No fracture. Normal alignment. Minor peripheral spurring. Patellar tendon enthesophyte at the patellar insertion. No erosive change or focal bone abnormality. No joint effusion. Vascular calcifications are seen no soft tissue gas radiopaque foreign body. IMPRESSION: 1.  Mild degenerative change. No acute osseous abnormality. 2. Subjective osteopenia/osteoporosis. Electronically Signed   By: Chadwick Colonel M.D.   On: 10/28/2023 18:17   CT ABDOMEN PELVIS W CONTRAST Result Date: 10/28/2023 CLINICAL DATA:  Sepsis. Altered mental status with elevated liver function studies. EXAM: CT ABDOMEN AND PELVIS WITH CONTRAST TECHNIQUE: Multidetector CT imaging of the abdomen and pelvis was performed using the standard protocol following bolus administration of intravenous contrast. RADIATION DOSE REDUCTION: This exam was performed according to the departmental dose-optimization program which includes automated exposure control, adjustment of the mA and/or kV according to patient size and/or use of iterative reconstruction technique. CONTRAST:  OMNIPAQUE  IOHEXOL  300 MG/ML  SOLN COMPARISON:  None Available. FINDINGS: Technical note: Despite efforts by the technologist and patient, mild motion artifact is present on today's exam and could not be eliminated. This reduces exam sensitivity and specificity. Lower chest: There are patchy airspace opacities at both lung bases, right greater than left, suspicious for aspiration. No significant pleural or pericardial effusion. There are prominent calcifications of the aortic valve with aortic and coronary artery atherosclerosis. Hepatobiliary: Severe hepatic steatosis with contour irregularity of the liver suspicious for cirrhosis. No focally suspicious liver lesions are identified. There are calcifications within the dome of the left hepatic lobe. No significant biliary dilatation status post cholecystectomy. Pancreas: Severe atrophy with scattered parenchymal calcifications suggesting chronic calcific pancreatitis. No significant pancreatic ductal dilatation or surrounding fluid collection identified. Spleen: Borderline splenomegaly.  No focal abnormality. Adrenals/Urinary Tract: Both adrenal glands appear normal. Punctate nonobstructing  calculus in the lower pole of the right kidney. No other evidence of urinary tract calculus, hydronephrosis or perinephric soft tissue stranding. There is no evidence of renal mass. The bladder appears unremarkable for its degree of distention. Stomach/Bowel: No enteric  contrast administered. Possible diffuse gastric wall thickening versus incomplete distension. No evidence of bowel distension, wall thickening or surrounding inflammation. Vascular/Lymphatic: Aortic and branch vessel atherosclerosis without evidence of aneurysm or large vessel occlusion. There are venous collaterals in the mid abdomen, likely due to underlying portal hypertension. No evidence of acute venous thrombosis. No enlarged abdominopelvic lymph nodes are identified. Reproductive: Mild enlargement of the prostate gland. Other: Upper abdominal ascites, primarily around the liver. No organized fluid collection or pneumoperitoneum. The abdominal wall appears intact. Musculoskeletal: No acute or significant osseous findings. Advanced degenerative disc disease with grade 1 anterolisthesis and biforaminal narrowing at L4-5. IMPRESSION: 1. Patchy airspace opacities at both lung bases, right greater than left, suspicious for aspiration. 2. Severe hepatic steatosis with contour irregularity of the liver suspicious for cirrhosis. There are venous collaterals in the mid abdomen and borderline splenomegaly, likely due to underlying portal hypertension. 3. Upper abdominal ascites, primarily around the liver. 4. Possible diffuse gastric wall thickening versus incomplete distension. Correlate clinically for gastritis. 5. Punctate nonobstructing right renal calculus. 6. Prominent calcifications of the aortic valve. Aortic atherosclerosis. Electronically Signed   By: Elmon Hagedorn M.D.   On: 10/28/2023 14:07   DG Chest Port 1 View Result Date: 10/28/2023 CLINICAL DATA:  Questionable sepsis.  Altered mental status. EXAM: PORTABLE CHEST 1 VIEW COMPARISON:   None Available. FINDINGS: 1110 hours. The heart size and mediastinal contours are normal. There are patchy airspace opacities in both lungs, right greater than left. No evidence of pneumothorax or significant pleural effusion. The bones appear unremarkable. Telemetry leads overlie the chest. IMPRESSION: Patchy airspace opacities in both lungs, right greater than left, suspicious for multifocal pneumonia. Electronically Signed   By: Elmon Hagedorn M.D.   On: 10/28/2023 13:56   CT Head Wo Contrast Result Date: 10/28/2023 CLINICAL DATA:  Altered mental status, aphasic, possible history of seizures. EXAM: CT HEAD WITHOUT CONTRAST TECHNIQUE: Contiguous axial images were obtained from the base of the skull through the vertex without intravenous contrast. RADIATION DOSE REDUCTION: This exam was performed according to the departmental dose-optimization program which includes automated exposure control, adjustment of the mA and/or kV according to patient size and/or use of iterative reconstruction technique. COMPARISON:  None Available. FINDINGS: Brain: No acute intracranial hemorrhage. No CT evidence of acute infarct. Nonspecific hypoattenuation in the periventricular and subcortical white matter favored to reflect chronic microvascular ischemic changes. Remote lacunar infarct in the right thalamus. No edema, mass effect, or midline shift. The basilar cisterns are patent. Ventricles: The ventricles are normal. Vascular: Atherosclerotic calcifications of the carotid siphons. No hyperdense vessel. Skull: No acute or aggressive finding. Orbits: Orbits are symmetric. Sinuses: The visualized paranasal sinuses are clear. Other: Mastoid air cells are clear. IMPRESSION: Examination is slightly limited due to motion artifact. No CT evidence of acute intracranial abnormality. Mild chronic microvascular ischemic changes. Remote lacunar infarct in the right thalamus. Electronically Signed   By: Denny Flack M.D.   On: 10/28/2023  13:09     TODAY-DAY OF DISCHARGE:  Subjective:   Eliziah Umberger today has no headache,no chest abdominal pain,no new weakness tingling or numbness, feels much better wants to go home today.   Objective:   Blood pressure 98/74, pulse 75, temperature 99 F (37.2 C), temperature source Oral, resp. rate (!) 22, height 5\' 6"  (1.676 m), weight 67.5 kg, SpO2 100%.  Intake/Output Summary (Last 24 hours) at 11/05/2023 0903 Last data filed at 11/05/2023 0515 Gross per 24 hour  Intake --  Output 400  ml  Net -400 ml   Filed Weights   11/03/23 0341 11/04/23 0432 11/05/23 0500  Weight: 67.5 kg 69.1 kg 67.5 kg    Exam: Awake Alert, Oriented *3, No new F.N deficits, Normal affect [place].AT,PERRAL Supple Neck,No JVD, No cervical lymphadenopathy appriciated.  Symmetrical Chest wall movement, Good air movement bilaterally, CTAB RRR,No Gallops,Rubs or new Murmurs, No Parasternal Heave +ve B.Sounds, Abd Soft, Non tender, No organomegaly appriciated, No rebound -guarding or rigidity. No Cyanosis, Clubbing or edema, No new Rash or bruise   PERTINENT RADIOLOGIC STUDIES: No results found.   PERTINENT LAB RESULTS: CBC: Recent Labs    11/04/23 0355 11/05/23 0351  WBC 8.2 8.4  HGB 12.6* 12.3*  HCT 35.9* 35.3*  PLT 154 169   CMET CMP     Component Value Date/Time   NA 132 (L) 11/04/2023 0355   K 3.9 11/04/2023 0355   CL 103 11/04/2023 0355   CO2 21 (L) 11/04/2023 0355   GLUCOSE 163 (H) 11/04/2023 0355   BUN 6 (L) 11/04/2023 0355   CREATININE 0.51 (L) 11/04/2023 0355   CALCIUM 7.9 (L) 11/04/2023 0355   PROT 4.9 (L) 11/01/2023 0517   ALBUMIN  2.3 (L) 11/01/2023 0517   AST 37 11/01/2023 0517   ALT 51 (H) 11/01/2023 0517   ALKPHOS 99 11/01/2023 0517   BILITOT 1.9 (H) 11/01/2023 0517   GFRNONAA >60 11/04/2023 0355    GFR Estimated Creatinine Clearance: 74.2 mL/min (A) (by C-G formula based on SCr of 0.51 mg/dL (L)). No results for input(s): "LIPASE", "AMYLASE" in the last 72 hours. No  results for input(s): "CKTOTAL", "CKMB", "CKMBINDEX", "TROPONINI" in the last 72 hours. Invalid input(s): "POCBNP" No results for input(s): "DDIMER" in the last 72 hours. No results for input(s): "HGBA1C" in the last 72 hours. No results for input(s): "CHOL", "HDL", "LDLCALC", "TRIG", "CHOLHDL", "LDLDIRECT" in the last 72 hours. No results for input(s): "TSH", "T4TOTAL", "T3FREE", "THYROIDAB" in the last 72 hours.  Invalid input(s): "FREET3" No results for input(s): "VITAMINB12", "FOLATE", "FERRITIN", "TIBC", "IRON", "RETICCTPCT" in the last 72 hours. Coags: No results for input(s): "INR" in the last 72 hours.  Invalid input(s): "PT" Microbiology: Recent Results (from the past 240 hours)  Blood Culture (routine x 2)     Status: None   Collection Time: 10/28/23 11:14 AM   Specimen: BLOOD  Result Value Ref Range Status   Specimen Description BLOOD LEFT ARM  Final   Special Requests   Final    BOTTLES DRAWN AEROBIC AND ANAEROBIC Blood Culture results may not be optimal due to an inadequate volume of blood received in culture bottles   Culture   Final    NO GROWTH 5 DAYS Performed at [facility], [address], [place], [place] [address]    Report Status 11/02/2023 FINAL  Final  Blood Culture (routine x 2)     Status: Abnormal   Collection Time: 10/28/23 11:14 AM   Specimen: BLOOD  Result Value Ref Range Status   Specimen Description   Final    BLOOD RIGHT ARM Performed at [facility], [address], [place], [place] [address]    Special Requests   Final    BOTTLES DRAWN AEROBIC AND ANAEROBIC Blood Culture results may not be optimal due to an inadequate volume of blood received in culture bottles Performed at [facility], [address] [place] [address], [place], [place] [address]    Culture  Setup Time   Final    GRAM POSITIVE COCCI AEROBIC BOTTLE  ONLY CRITICAL RESULT CALLED TO, READ BACK BY AND VERIFIED WITH: Lafrances Pigeon [facility] 1610 10/29/23 HNM     Culture (A)  Final    STAPHYLOCOCCUS EPIDERMIDIS THE SIGNIFICANCE OF ISOLATING THIS ORGANISM FROM A SINGLE SET OF BLOOD CULTURES WHEN MULTIPLE SETS ARE DRAWN IS UNCERTAIN. PLEASE NOTIFY THE MICROBIOLOGY DEPARTMENT WITHIN ONE WEEK IF SPECIATION AND SENSITIVITIES ARE REQUIRED. Performed at [facility], [address], [place], [place] [address]    Report Status 10/31/2023 FINAL  Final  Resp panel by RT-PCR (RSV, Flu A&B, Covid) Urine, Catheterized     Status: None   Collection Time: 10/28/23 11:14 AM   Specimen: Urine, Catheterized; Nasal Swab  Result Value Ref Range Status   SARS Coronavirus 2 by RT PCR NEGATIVE NEGATIVE Final    Comment: (NOTE) SARS-CoV-2 target nucleic acids are NOT DETECTED.  The SARS-CoV-2 RNA is generally detectable in upper respiratory specimens during the acute phase of infection. The lowest concentration of SARS-CoV-2 viral copies this assay can detect is 138 copies/mL. A negative result does not preclude SARS-Cov-2 infection and should not be used as the sole basis for treatment or other patient management decisions. A negative result may occur with  improper specimen collection/handling, submission of specimen other than nasopharyngeal swab, presence of viral mutation(s) within the areas targeted by this assay, and inadequate number of viral copies(<138 copies/mL). A negative result must be combined with clinical observations, patient history, and epidemiological information. The expected result is Negative.  Fact Sheet for Patients:  BloggerCourse.com  Fact Sheet for Healthcare Providers:  SeriousBroker.it  This test is no t yet approved or cleared by the [place]  FDA and  has been authorized for detection and/or diagnosis of SARS-CoV-2 by FDA under an Emergency Use Authorization (EUA). This EUA will remain  in effect (meaning this test can be used) for the duration of the COVID-19 declaration  under Section 564(b)(1) of the Act, 21 U.S.C.section 360bbb-3(b)(1), unless the authorization is terminated  or revoked sooner.       Influenza A by PCR NEGATIVE NEGATIVE Final   Influenza B by PCR NEGATIVE NEGATIVE Final    Comment: (NOTE) The Xpert Xpress SARS-CoV-2/FLU/RSV plus assay is intended as an aid in the diagnosis of influenza from Nasopharyngeal swab specimens and should not be used as a sole basis for treatment. Nasal washings and aspirates are unacceptable for Xpert Xpress SARS-CoV-2/FLU/RSV testing.  Fact Sheet for Patients: BloggerCourse.com  Fact Sheet for Healthcare Providers: SeriousBroker.it  This test is not yet approved or cleared by the [place]  FDA and has been authorized for detection and/or diagnosis of SARS-CoV-2 by FDA under an Emergency Use Authorization (EUA). This EUA will remain in effect (meaning this test can be used) for the duration of the COVID-19 declaration under Section 564(b)(1) of the Act, 21 U.S.C. section 360bbb-3(b)(1), unless the authorization is terminated or revoked.     Resp Syncytial Virus by PCR NEGATIVE NEGATIVE Final    Comment: (NOTE) Fact Sheet for Patients: BloggerCourse.com  Fact Sheet for Healthcare Providers: SeriousBroker.it  This test is not yet approved or cleared by the [place]  FDA and has been authorized for detection and/or diagnosis of SARS-CoV-2 by FDA under an Emergency Use Authorization (EUA). This EUA will remain in effect (meaning this test can be used) for the duration of the COVID-19 declaration under Section 564(b)(1) of the Act, 21 U.S.C. section 360bbb-3(b)(1), unless the authorization is terminated or revoked.  Performed at [facility], [address]  [place], [place] [address]   Blood Culture ID Panel (Reflexed)     Status: Abnormal   Collection Time: 10/28/23 11:14 AM   Result Value Ref Range Status   Enterococcus faecalis NOT DETECTED NOT DETECTED Final   Enterococcus Faecium NOT DETECTED NOT DETECTED Final   Listeria monocytogenes NOT DETECTED NOT DETECTED Final   Staphylococcus species DETECTED (A) NOT DETECTED Final    Comment: CRITICAL RESULT CALLED TO, READ BACK BY AND VERIFIED WITH: Lafrances Pigeon [facility] 6045 10/29/23 HNM    Staphylococcus aureus (BCID) NOT DETECTED NOT DETECTED Final   Staphylococcus epidermidis DETECTED (A) NOT DETECTED Final    Comment: CRITICAL RESULT CALLED TO, READ BACK BY AND VERIFIED WITH: Lafrances Pigeon [facility] 4098 10/29/23 HNM    Staphylococcus lugdunensis NOT DETECTED NOT DETECTED Final   Streptococcus species NOT DETECTED NOT DETECTED Final   Streptococcus agalactiae NOT DETECTED NOT DETECTED Final   Streptococcus pneumoniae NOT DETECTED NOT DETECTED Final   Streptococcus pyogenes NOT DETECTED NOT DETECTED Final   A.calcoaceticus-baumannii NOT DETECTED NOT DETECTED Final   Bacteroides fragilis NOT DETECTED NOT DETECTED Final   Enterobacterales NOT DETECTED NOT DETECTED Final   Enterobacter cloacae complex NOT DETECTED NOT DETECTED Final   Escherichia coli NOT DETECTED NOT DETECTED Final   Klebsiella aerogenes NOT DETECTED NOT DETECTED Final   Klebsiella oxytoca NOT DETECTED NOT DETECTED Final   Klebsiella pneumoniae NOT DETECTED NOT DETECTED Final   Proteus species NOT DETECTED NOT DETECTED Final   Salmonella species NOT DETECTED NOT DETECTED Final   Serratia marcescens NOT DETECTED NOT DETECTED Final   Haemophilus influenzae NOT DETECTED NOT DETECTED Final   Neisseria meningitidis NOT DETECTED NOT DETECTED Final   Pseudomonas aeruginosa NOT DETECTED NOT DETECTED Final   Stenotrophomonas maltophilia NOT DETECTED NOT DETECTED Final   Candida albicans NOT DETECTED NOT DETECTED Final   Candida auris NOT DETECTED NOT DETECTED Final   Candida glabrata NOT DETECTED NOT DETECTED Final   Candida krusei NOT DETECTED NOT  DETECTED Final   Candida parapsilosis NOT DETECTED NOT DETECTED Final   Candida tropicalis NOT DETECTED NOT DETECTED Final   Cryptococcus neoformans/gattii NOT DETECTED NOT DETECTED Final   Methicillin resistance mecA/C NOT DETECTED NOT DETECTED Final    Comment: Performed at [facility], [address] [place], [place] [address]  C Difficile Quick Screen w PCR reflex     Status: None   Collection Time: 10/29/23  5:42 PM   Specimen: STOOL  Result Value Ref Range Status   C Diff antigen NEGATIVE NEGATIVE Final   C Diff toxin NEGATIVE NEGATIVE Final   C Diff interpretation No C. difficile detected.  Final    Comment: Performed at [facility], [address] [place], [place] [address]  Gastrointestinal Panel by PCR , Stool     Status: None   Collection Time: 10/29/23  5:42 PM   Specimen: STOOL  Result Value Ref Range Status   Campylobacter species NOT DETECTED NOT DETECTED Final   Plesimonas shigelloides NOT DETECTED NOT DETECTED Final   Salmonella species NOT DETECTED NOT DETECTED Final   Yersinia enterocolitica NOT DETECTED NOT DETECTED Final   Vibrio species NOT DETECTED NOT DETECTED Final   Vibrio cholerae NOT DETECTED NOT DETECTED Final   Enteroaggregative E coli (EAEC) NOT DETECTED NOT DETECTED Final   Enteropathogenic E coli (EPEC) NOT DETECTED NOT DETECTED Final   Enterotoxigenic E coli (ETEC) NOT DETECTED NOT DETECTED Final   Shiga like toxin producing E coli (STEC) NOT DETECTED  NOT DETECTED Final   Shigella/Enteroinvasive E coli (EIEC) NOT DETECTED NOT DETECTED Final   Cryptosporidium NOT DETECTED NOT DETECTED Final   Cyclospora cayetanensis NOT DETECTED NOT DETECTED Final   Entamoeba histolytica NOT DETECTED NOT DETECTED Final   Giardia lamblia NOT DETECTED NOT DETECTED Final   Adenovirus F40/41 NOT DETECTED NOT DETECTED Final   Astrovirus NOT DETECTED NOT DETECTED Final   Norovirus GI/GII NOT DETECTED NOT DETECTED Final   Rotavirus A NOT DETECTED  NOT DETECTED Final   Sapovirus (I, II, IV, and V) NOT DETECTED NOT DETECTED Final    Comment: Performed at [facility], [address] [place], [place] [address]  Culture, blood (single) w Reflex to ID Panel     Status: None   Collection Time: 10/29/23  5:55 PM   Specimen: BLOOD  Result Value Ref Range Status   Specimen Description BLOOD LEFT ANTECUBITAL  Final   Special Requests   Final    BOTTLES DRAWN AEROBIC AND ANAEROBIC Blood Culture adequate volume   Culture   Final    NO GROWTH 5 DAYS Performed at [facility], [address] [place], [place] [address]    Report Status 11/03/2023 FINAL  Final  Respiratory (~20 pathogens) panel by PCR     Status: None   Collection Time: 10/31/23  5:50 PM   Specimen: Nasopharyngeal Swab; Respiratory  Result Value Ref Range Status   Adenovirus NOT DETECTED NOT DETECTED Final   Coronavirus 229E NOT DETECTED NOT DETECTED Final    Comment: (NOTE) The Coronavirus on the Respiratory Panel, DOES NOT test for the novel  Coronavirus (2019 nCoV)    Coronavirus HKU1 NOT DETECTED NOT DETECTED Final   Coronavirus NL63 NOT DETECTED NOT DETECTED Final   Coronavirus OC43 NOT DETECTED NOT DETECTED Final   Metapneumovirus NOT DETECTED NOT DETECTED Final   Rhinovirus / Enterovirus NOT DETECTED NOT DETECTED Final   Influenza A NOT DETECTED NOT DETECTED Final   Influenza B NOT DETECTED NOT DETECTED Final   Parainfluenza Virus 1 NOT DETECTED NOT DETECTED Final   Parainfluenza Virus 2 NOT DETECTED NOT DETECTED Final   Parainfluenza Virus 3 NOT DETECTED NOT DETECTED Final   Parainfluenza Virus 4 NOT DETECTED NOT DETECTED Final   Respiratory Syncytial Virus NOT DETECTED NOT DETECTED Final   Bordetella pertussis NOT DETECTED NOT DETECTED Final   Bordetella Parapertussis NOT DETECTED NOT DETECTED Final   Chlamydophila pneumoniae NOT DETECTED NOT DETECTED Final   Mycoplasma pneumoniae NOT DETECTED NOT DETECTED Final    Comment: Performed at  [facility], [address] [place], [place] [address]  Culture, blood (Routine X 2) w Reflex to ID Panel     Status: None   Collection Time: 10/31/23  7:44 PM   Specimen: BLOOD RIGHT ARM  Result Value Ref Range Status   Specimen Description BLOOD RIGHT ARM  Final   Special Requests   Final    BOTTLES DRAWN AEROBIC AND ANAEROBIC Blood Culture adequate volume   Culture   Final    NO GROWTH 5 DAYS Performed at [facility], [address] [place], [place] [address]    Report Status 11/05/2023 FINAL  Final  Culture, blood (Routine X 2) w Reflex to ID Panel     Status: None (Preliminary result)   Collection Time: 10/31/23  7:53 PM   Specimen: BLOOD LEFT HAND  Result Value Ref Range Status   Specimen Description BLOOD LEFT HAND  Final   Special Requests  Final    BOTTLES DRAWN AEROBIC AND ANAEROBIC Blood Culture adequate volume   Culture   Final    NO GROWTH 4 DAYS Performed at [facility], [address] [place], [place] [address]    Report Status PENDING  Incomplete  C Difficile Quick Screen w PCR reflex     Status: None   Collection Time: 11/01/23 12:36 AM   Specimen: STOOL  Result Value Ref Range Status   C Diff antigen NEGATIVE NEGATIVE Final   C Diff toxin NEGATIVE NEGATIVE Final   C Diff interpretation No C. difficile detected.  Final    Comment: Performed at [facility], [address] [place], [place] [address]  Gastrointestinal Panel by PCR , Stool     Status: None   Collection Time: 11/01/23 12:36 AM   Specimen: STOOL  Result Value Ref Range Status   Campylobacter species NOT DETECTED NOT DETECTED Final   Plesimonas shigelloides NOT DETECTED NOT DETECTED Final   Salmonella species NOT DETECTED NOT DETECTED Final   Yersinia enterocolitica NOT DETECTED NOT DETECTED Final   Vibrio species NOT DETECTED NOT DETECTED Final   Vibrio cholerae NOT DETECTED NOT DETECTED Final   Enteroaggregative E coli (EAEC) NOT DETECTED NOT DETECTED Final    Enteropathogenic E coli (EPEC) NOT DETECTED NOT DETECTED Final   Enterotoxigenic E coli (ETEC) NOT DETECTED NOT DETECTED Final   Shiga like toxin producing E coli (STEC) NOT DETECTED NOT DETECTED Final   Shigella/Enteroinvasive E coli (EIEC) NOT DETECTED NOT DETECTED Final   Cryptosporidium NOT DETECTED NOT DETECTED Final   Cyclospora cayetanensis NOT DETECTED NOT DETECTED Final   Entamoeba histolytica NOT DETECTED NOT DETECTED Final   Giardia lamblia NOT DETECTED NOT DETECTED Final   Adenovirus F40/41 NOT DETECTED NOT DETECTED Final   Astrovirus NOT DETECTED NOT DETECTED Final   Norovirus GI/GII NOT DETECTED NOT DETECTED Final   Rotavirus A NOT DETECTED NOT DETECTED Final   Sapovirus (I, II, IV, and V) NOT DETECTED NOT DETECTED Final    Comment: Performed at [facility] [address] [place], [place] [address]    FURTHER DISCHARGE INSTRUCTIONS:  Get Medicines reviewed and adjusted: Please take all your medications with you for your next visit with your Primary MD  Laboratory/radiological data: Please request your Primary MD to go over all hospital tests and procedure/radiological results at the follow up, please ask your Primary MD to get all Hospital records sent to his/her office.  In some cases, they will be blood work, cultures and biopsy results pending at the time of your discharge. Please request that your primary care M.D. goes through all the records of your hospital data and follows up on these results.  Also Note the following: If you experience worsening of your admission symptoms, develop shortness of breath, life threatening emergency, suicidal or homicidal thoughts you must seek medical attention immediately by calling 911 or calling your MD immediately  if symptoms less severe.  You must read complete instructions/literature along with all the possible adverse reactions/side effects for all the Medicines you take and that have been prescribed to you. Take  any new Medicines after you have completely understood and accpet all the possible adverse reactions/side effects.   Do not drive when taking Pain medications or sleeping medications (Benzodaizepines)  Do not take more than prescribed Pain, Sleep and Anxiety Medications. It is not advisable to combine anxiety,sleep and pain medications without talking with your primary care practitioner  Special  Instructions: If you have smoked or chewed Tobacco  in the last 2 yrs please stop smoking, stop any regular Alcohol  and or any Recreational drug use.  Wear Seat belts while driving.  Please note: You were cared for by a hospitalist during your hospital stay. Once you are discharged, your primary care physician will handle any further medical issues. Please note that NO REFILLS for any discharge medications will be authorized once you are discharged, as it is imperative that you return to your primary care physician (or establish a relationship with a primary care physician if you do not have one) for your post hospital discharge needs so that they can reassess your need for medications and monitor your lab values.  Total Time spent coordinating discharge including counseling, education and face to face time equals greater than 30 minutes.  SignedKimberly Penna 11/05/2023 9:03 AM

## 2023-11-05 NOTE — Progress Notes (Signed)
 Speech Language Pathology Treatment: Cognitive-Linquistic  Patient Details Name: [name] MRN: [id number] DOB: [birth year] Today's Date: 11/05/2023 Time: 4098-1191 SLP Time Calculation (min) (ACUTE ONLY): 16 min  Assessment / Plan / Recommendation Clinical Impression  Skilled therapy session focused on cognitive goals. Patient independently oriented to self, location, situation and time. SLP facilitated completion of medication management task to encourage use of problem solving, memory and organizational skills.  SLP challenged patient through identification of medication mistakes task. Patient required supervisionA to identify errors in BID pillbox and correct according to directions. SLP educated patient on importance of utilizing pill box at d/c to aid in memory. Patient verbalized understanding. Patient left in chair with call bell in reach. Continue POC.   HPI HPI: Perneurology note, "[name] is a 73 y.o. male with acute cerebellar infarct, as well as seizures.  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  He was managed on phenobarbital at that time.  He does have a history of alcohol abuse, and had recently started drinking again, but doubt that alcohol withdrawal played a significant role in his seizure.     EEG on 4/22 recorded a subtle clinical seizure(confusion as only clinical manifestation), and he was started on Keppra .     I am concerned that he may be having ongoing intermittent subclinical seizures.  Without significant improvement overnight, I think that the next step would be transfer for long-term continuous EEG recording.     His stroke is fairly large and peripheral, and therefore embolic etiology is certainly possible and he will need further workup for this." MRI 4/22: Subsequently obtained CTA of the head and neck shows that the area  of abnormal diffusion weighted imaging in the right cerebellar  hemisphere  is more consistent with a subacute infarction. There is  no hemorrhage.      SLP Plan  Continue with current plan of care      Recommendations for follow up therapy are one component of a multi-disciplinary discharge planning process, led by the attending physician.  Recommendations may be updated based on patient status, additional functional criteria and insurance authorization.    Recommendations               Intermittent Supervision/Assistance Cognitive communication deficit (R41.841)     Continue with current plan of care     Lavida Patch M.A., CCC-SLP 11/05/2023, 10:01 AM

## 2023-11-06 ENCOUNTER — Other Ambulatory Visit: Payer: Self-pay | Admitting: Family Medicine

## 2023-11-06 ENCOUNTER — Other Ambulatory Visit: Payer: Self-pay | Admitting: Student

## 2023-11-06 DIAGNOSIS — K7031 Alcoholic cirrhosis of liver with ascites: Secondary | ICD-10-CM

## 2023-11-06 DIAGNOSIS — I6310 Cerebral infarction due to embolism of unspecified precerebral artery: Secondary | ICD-10-CM

## 2023-11-06 DIAGNOSIS — I639 Cerebral infarction, unspecified: Secondary | ICD-10-CM

## 2023-11-06 LAB — CULTURE, BLOOD (ROUTINE X 2)
Culture: NO GROWTH
Special Requests: ADEQUATE

## 2023-11-06 NOTE — Progress Notes (Signed)
 Cardiac monitor for embolic CVA. Dr Addie Holstein to read

## 2023-11-07 ENCOUNTER — Encounter: Payer: Self-pay | Admitting: Oncology

## 2023-11-12 ENCOUNTER — Telehealth: Payer: Self-pay | Admitting: Family Medicine

## 2023-11-12 NOTE — Telephone Encounter (Signed)
 Copied from CRM [id number]. Topic: Appointments - Scheduling Inquiry for Clinic >> Nov 11, 2023  4:08 PM DeAngela L wrote: Reason for CRM: lisa Nurse case manager has not had any luck trying to contact spouse Giuseppe Omo [phone] [facility] will not be in the office tomorrow and just wanted to inform Dr Pinkie Brigham office that the spouse has not called back yet and doesn't even knows the appt information since she has not been able to contact her since earlier  Please call the spouse to try and inform her of his appt

## 2023-11-12 NOTE — Telephone Encounter (Signed)
 Appt cancelled

## 2023-11-12 NOTE — Telephone Encounter (Signed)
 Appt made with Dr. Shann Darnel for 11/18/2023 at 11am

## 2023-11-13 ENCOUNTER — Inpatient Hospital Stay: Admitting: Family Medicine

## 2023-11-15 DIAGNOSIS — I6310 Cerebral infarction due to embolism of unspecified precerebral artery: Secondary | ICD-10-CM | POA: Diagnosis not present

## 2023-11-15 DIAGNOSIS — I639 Cerebral infarction, unspecified: Secondary | ICD-10-CM | POA: Diagnosis not present

## 2023-11-18 ENCOUNTER — Ambulatory Visit: Admitting: Family Medicine

## 2023-11-18 ENCOUNTER — Encounter: Payer: Self-pay | Admitting: Family Medicine

## 2023-11-18 VITALS — BP 98/71 | HR 67 | Resp 16 | Wt 135.0 lb

## 2023-11-18 DIAGNOSIS — K709 Alcoholic liver disease, unspecified: Secondary | ICD-10-CM

## 2023-11-18 DIAGNOSIS — A419 Sepsis, unspecified organism: Secondary | ICD-10-CM

## 2023-11-18 DIAGNOSIS — K219 Gastro-esophageal reflux disease without esophagitis: Secondary | ICD-10-CM

## 2023-11-18 DIAGNOSIS — M545 Low back pain, unspecified: Secondary | ICD-10-CM

## 2023-11-18 DIAGNOSIS — G8929 Other chronic pain: Secondary | ICD-10-CM

## 2023-11-18 DIAGNOSIS — I6930 Unspecified sequelae of cerebral infarction: Secondary | ICD-10-CM | POA: Diagnosis not present

## 2023-11-18 DIAGNOSIS — R110 Nausea: Secondary | ICD-10-CM

## 2023-11-18 MED ORDER — OMEPRAZOLE 40 MG PO CPDR: DELAYED_RELEASE_CAPSULE | ORAL | 1 refills | Status: DC

## 2023-11-18 MED ORDER — PROMETHAZINE HCL 25 MG PO TABS
25.0000 mg | ORAL_TABLET | Freq: Three times a day (TID) | ORAL | 3 refills | Status: AC | PRN
Start: 2023-11-18 — End: ?

## 2023-11-20 ENCOUNTER — Ambulatory Visit: Payer: Self-pay | Admitting: Family Medicine

## 2023-11-21 LAB — COMPREHENSIVE METABOLIC PANEL WITH GFR
ALT: 18 IU/L (ref 0–44)
AST: 27 IU/L (ref 0–40)
Albumin: 3.5 g/dL — ABNORMAL LOW (ref 3.8–4.8)
Alkaline Phosphatase: 257 IU/L — ABNORMAL HIGH (ref 44–121)
BUN/Creatinine Ratio: 25 — ABNORMAL HIGH (ref 10–24)
BUN: 18 mg/dL (ref 8–27)
Bilirubin Total: 1 mg/dL (ref 0.0–1.2)
CO2: 27 mmol/L (ref 20–29)
Calcium: 8.9 mg/dL (ref 8.6–10.2)
Chloride: 94 mmol/L — ABNORMAL LOW (ref 96–106)
Creatinine, Ser: 0.71 mg/dL — ABNORMAL LOW (ref 0.76–1.27)
Globulin, Total: 2.8 g/dL (ref 1.5–4.5)
Glucose: 254 mg/dL — ABNORMAL HIGH (ref 70–99)
Potassium: 5.1 mmol/L (ref 3.5–5.2)
Sodium: 135 mmol/L (ref 134–144)
Total Protein: 6.3 g/dL (ref 6.0–8.5)
eGFR: 97 mL/min/{1.73_m2} (ref >59)

## 2023-11-21 LAB — CBC WITH DIFFERENTIAL/PLATELET
Basophils Absolute: 0.1 10*3/uL (ref 0.0–0.2)
Basos: 1 %
EOS (ABSOLUTE): 0.2 10*3/uL (ref 0.0–0.4)
Eos: 3 %
Hematocrit: 41.2 % (ref 37.5–51.0)
Hemoglobin: 13.9 g/dL (ref 13.0–17.7)
Immature Grans (Abs): 0 10*3/uL (ref 0.0–0.1)
Immature Granulocytes: 1 %
Lymphocytes Absolute: 1.2 10*3/uL (ref 0.7–3.1)
Lymphs: 16 %
MCH: 33.9 pg — ABNORMAL HIGH (ref 26.6–33.0)
MCHC: 33.7 g/dL (ref 31.5–35.7)
MCV: 101 fL — ABNORMAL HIGH (ref 79–97)
Monocytes Absolute: 1.2 10*3/uL — ABNORMAL HIGH (ref 0.1–0.9)
Monocytes: 16 %
Neutrophils Absolute: 4.7 10*3/uL (ref 1.4–7.0)
Neutrophils: 63 %
Platelets: 248 10*3/uL (ref 150–450)
RBC: 4.1 x10E6/uL — ABNORMAL LOW (ref 4.14–5.80)
RDW: 13.5 % (ref 11.6–15.4)
WBC: 7.4 10*3/uL (ref 3.4–10.8)

## 2023-11-21 LAB — VITAMIN B1: Thiamine: 164.8 nmol/L (ref 66.5–200.0)

## 2023-11-27 ENCOUNTER — Ambulatory Visit: Payer: Self-pay

## 2023-11-27 NOTE — Telephone Encounter (Signed)
 Patient called in with confusion on taking insulin . This RN ensured patient's wife, Myrla Asp, and patient were both on the line. Patient states since he has had his stroke, his memory has been shot and he has had confusion. Patient was hospitalized and discharged on April 29th. Patient states since April 29th, he has not taken any insulin  for his glucose control, and has not been checking his glucose. Patient's wife states she has been giving patient his prescribed Metformin  with breakfast and dinner. Patient's wife states patient was completely taken off of the Tresiba  insulin . Patient's wife states patient has not taken any insulin  at all for meal coverage since April 29th. This RN had patient take glucose reading while on the phone. Glucose came back as 356. Patient encouraged to take 7 units of Novolog  for appropriate sliding scale. Patient educated thoroughly on following sliding scale for insulin  dosing at meal times. Patient educated to monitor glucose throughout the day, 3 times with meals. Patient educated to call office if glucose is falling out of 70-150 range. Patient's wife has full understanding and states they will call us  with any concerns.     Copied from CRM [id number]. Topic: Clinical - Medication Question >> Nov 27, 2023  9:06 AM Essie A wrote: Reason for CRM: Edwina Gram from [facility] [employer] called because patient is confused regarding taking his insulin , what and how he is supposed to take it.  Also patient does not have much of an appetite but he is drinking plenty of fluids. Patient says sometimes food is getting bigger in his mouth the more he chews so he is not eating a lot.  Someone needs to call him because he can get into his MyChart account.  His phone number is [phone]. Reason for Disposition  Health Information question, no triage required and triager able to answer question  Answer Assessment - Initial Assessment Questions 1. REASON FOR CALL or QUESTION: "What is your reason  for calling today?" or "How can I best help you?" or "What question do you have that I can help answer?"     See notes  Protocols used: Information Only Call - No Triage-A-AH

## 2023-11-28 ENCOUNTER — Ambulatory Visit: Payer: Self-pay | Admitting: *Deleted

## 2023-11-28 NOTE — Telephone Encounter (Signed)
 Copied from CRM [id number]. Topic: Clinical - Red Word Triage >> Nov 28, 2023  3:40 PM Phil Braun wrote: Red Word that prompted transfer to Nurse Triage:   Glucose level this morning was 389  At lunch 367  He said he was in the hosp and they changed up his medication and its not working like it was. Please advise. Reason for Disposition  [1] Blood glucose > 300 mg/dL (56.2 mmol/L) AND [1] two or more times in a row  Answer Assessment - Initial Assessment Questions 1. BLOOD GLUCOSE: "What is your blood glucose level?"      Lunch glucose 367 today.   This morning is was 389.   The hospital sent me home.   I'm to stop the night time insulin .   I'm to take metformin  at night.  It's not working.   They cut back my insulin  I was to take before meals. The hospital changed my medications around.    2. ONSET: "When did you check the blood glucose?"     See above 3. USUAL RANGE: "What is your glucose level usually?" (e.g., usual fasting morning value, usual evening value)     In the past normally 70-130-140. 4. KETONES: "Do you check for ketones (urine or blood test strips)?" If Yes, ask: "What does the test show now?"      Not asked 5. TYPE 1 or 2:  "Do you know what type of diabetes you have?"  (e.g., Type 1, Type 2, Gestational; doesn't know)      Type 2 6. INSULIN : "Do you take insulin ?" "What type of insulin (s) do you use? What is the mode of delivery? (syringe, pen; injection or pump)?"      Yes 7. DIABETES PILLS: "Do you take any pills for your diabetes?" If Yes, ask: "Have you missed taking any pills recently?"     Metformin  8. OTHER SYMPTOMS: "Do you have any symptoms?" (e.g., fever, frequent urination, difficulty breathing, dizziness, weakness, vomiting)     I stay thirsty,  I pee a lot.   No dizziness 9. PREGNANCY: "Is there any chance you are pregnant?" "When was your last menstrual period?"     N/A  Protocols used: Diabetes - High Blood Sugar-A-AH  Chief Complaint: Glucose staying  elevated in the 300s since being in the hospital with a stroke in April 2025. Symptoms: Thirsty a lot, peeing a lot Frequency: since being in the hospital Pertinent Negatives: Patient denies dizziness Disposition: [] ED /[] Urgent Care (no appt availability in office) / [x] Appointment(In office/virtual)/ []  [facility]/ [] Home Care/ [] Refused Recommended Disposition /[] [facility]/ []  Follow-up with PCP Additional Notes: Appt made with Janna Ostwalt, PA-C for 11/29/2023 at 1:20.

## 2023-11-29 ENCOUNTER — Ambulatory Visit (INDEPENDENT_AMBULATORY_CARE_PROVIDER_SITE_OTHER): Admitting: Physician Assistant

## 2023-11-29 VITALS — BP 95/63 | Resp 16 | Ht 65.0 in | Wt 137.0 lb

## 2023-11-29 DIAGNOSIS — E1165 Type 2 diabetes mellitus with hyperglycemia: Secondary | ICD-10-CM | POA: Diagnosis not present

## 2023-11-29 DIAGNOSIS — Z794 Long term (current) use of insulin: Secondary | ICD-10-CM

## 2023-11-29 DIAGNOSIS — I672 Cerebral atherosclerosis: Secondary | ICD-10-CM

## 2023-11-29 DIAGNOSIS — I639 Cerebral infarction, unspecified: Secondary | ICD-10-CM | POA: Diagnosis not present

## 2023-11-29 DIAGNOSIS — Z8673 Personal history of transient ischemic attack (TIA), and cerebral infarction without residual deficits: Secondary | ICD-10-CM | POA: Diagnosis not present

## 2023-11-29 DIAGNOSIS — R569 Unspecified convulsions: Secondary | ICD-10-CM

## 2023-11-29 MED ORDER — LEVETIRACETAM 750 MG PO TABS: 1500.0000 mg | ORAL_TABLET | Freq: Two times a day (BID) | ORAL | 1 refills | Status: DC

## 2023-11-29 MED ORDER — FREESTYLE LIBRE 3 PLUS SENSOR MISC
11 refills | Status: AC
Start: 2023-11-29 — End: ?

## 2023-11-29 NOTE — Patient Instructions (Addendum)
   Increase Tresiba  (insulin  degludec) dose by 2 units every 3 to 5 days until fasting blood glucose is within target range./80-130  Adjust Novolog  (insulin  aspart) dose based on before meal glucose levels, increasing by 10% to 20% if preprandial glucose is above 180 mg/dL.  Monitor for hypoglycemia and reduce doses by 10% to 20% if fasting blood glucose is below 70 mg/dL.  Ensure frequent blood glucose monitoring to guide dose adjustments and prevent severe hypoglycemia

## 2023-11-30 LAB — COMPREHENSIVE METABOLIC PANEL WITH GFR
ALT: 16 IU/L (ref 0–44)
AST: 18 IU/L (ref 0–40)
Albumin: 3.6 g/dL — ABNORMAL LOW (ref 3.8–4.8)
Alkaline Phosphatase: 241 IU/L — ABNORMAL HIGH (ref 44–121)
BUN/Creatinine Ratio: 27 — ABNORMAL HIGH (ref 10–24)
BUN: 24 mg/dL (ref 8–27)
Bilirubin Total: 0.8 mg/dL (ref 0.0–1.2)
CO2: 22 mmol/L (ref 20–29)
Calcium: 8.6 mg/dL (ref 8.6–10.2)
Chloride: 87 mmol/L — ABNORMAL LOW (ref 96–106)
Creatinine, Ser: 0.9 mg/dL (ref 0.76–1.27)
Globulin, Total: 2.7 g/dL (ref 1.5–4.5)
Glucose: 254 mg/dL — ABNORMAL HIGH (ref 70–99)
Potassium: 3.9 mmol/L (ref 3.5–5.2)
Sodium: 129 mmol/L — ABNORMAL LOW (ref 134–144)
Total Protein: 6.3 g/dL (ref 6.0–8.5)
eGFR: 90 mL/min/{1.73_m2} (ref >59)

## 2023-11-30 LAB — CBC WITH DIFFERENTIAL/PLATELET
Basophils Absolute: 0.1 10*3/uL (ref 0.0–0.2)
Basos: 1 %
EOS (ABSOLUTE): 0.2 10*3/uL (ref 0.0–0.4)
Eos: 2 %
Hematocrit: 26.6 % — ABNORMAL LOW (ref 37.5–51.0)
Hemoglobin: 8.8 g/dL — ABNORMAL LOW (ref 13.0–17.7)
Immature Grans (Abs): 0 10*3/uL (ref 0.0–0.1)
Immature Granulocytes: 1 %
Lymphocytes Absolute: 1 10*3/uL (ref 0.7–3.1)
Lymphs: 11 %
MCH: 32.8 pg (ref 26.6–33.0)
MCHC: 33.1 g/dL (ref 31.5–35.7)
MCV: 99 fL — ABNORMAL HIGH (ref 79–97)
Monocytes Absolute: 1.2 10*3/uL — ABNORMAL HIGH (ref 0.1–0.9)
Monocytes: 14 %
Neutrophils Absolute: 6.2 10*3/uL (ref 1.4–7.0)
Neutrophils: 71 %
Platelets: 310 10*3/uL (ref 150–450)
RBC: 2.68 x10E6/uL — CL (ref 4.14–5.80)
RDW: 15.4 % (ref 11.6–15.4)
WBC: 8.6 10*3/uL (ref 3.4–10.8)

## 2023-12-01 ENCOUNTER — Encounter: Payer: Self-pay | Admitting: Physician Assistant

## 2023-12-01 DIAGNOSIS — R569 Unspecified convulsions: Secondary | ICD-10-CM

## 2023-12-01 HISTORY — DX: Unspecified convulsions: R56.9

## 2023-12-01 NOTE — Progress Notes (Signed)
 Established patient visit  Patient: [name]   DOB: January 29, 1951   73 y.o. Male  MRN: [id number] Visit Date: 11/29/2023  Today's healthcare provider: Blane Bunting, PA-C   Chief Complaint  Patient presents with  . Diabetes    High BS for 2 wks since hospital discharge. Pt wants a refill I seizures meds.   Subjective      Discussed the use of AI scribe software for clinical note transcription with the patient, who gave verbal consent to proceed.  History of Present Illness [name] is a 73 year old male with diabetes who presents with elevated blood sugar levels following recent hospitalization.  Since discharge, he experiences elevated blood sugar levels, often reaching 300 mg/dL or more, particularly in the mornings and throughout the day. His nighttime insulin  was discontinued and replaced with metformin , 250 mg twice daily. He has reduced his Novolog  intake to a third of the pre-hospitalization dose. Prior to hospitalization, his blood sugar was well-controlled with Tresiba  at bedtime, often around 80 mg/dL in the mornings. Recent readings include 389 mg/dL two days ago and 409 mg/dL at the next meal, despite following his insulin  chart. He has not used the continuous glucose monitor provided.  He has a history of stroke, seizures, and encephalopathy, currently managed with Keppra , with three days of medication remaining and no refills. He has not seen a neurologist since discharge. He denies recent chest pain, shortness of breath, or rapid heart rate but reports lack of stamina and occasional shortness of breath.       11/29/2023    2:10 PM 04/19/2023    1:15 PM 03/19/2023    1:42 PM  Depression screen PHQ 2/9  Decreased Interest 0 0 0  Down, Depressed, Hopeless 0 0 0  PHQ - 2 Score 0 0 0  Altered sleeping 0 0   Tired, decreased energy 1 1   Change in appetite 1 0   Feeling bad or failure about yourself  0 0   Trouble concentrating 0 0   Moving slowly or  fidgety/restless 0 0   Suicidal thoughts 0 0   PHQ-9 Score 2 1   Difficult doing work/chores Not difficult at all Not difficult at all       11/29/2023    2:11 PM 04/19/2023    1:15 PM  GAD 7 : Generalized Anxiety Score  Nervous, Anxious, on Edge 0 0  Control/stop worrying 0 0  Worry too much - different things 0 0  Trouble relaxing 0 0  Restless 0 0  Easily annoyed or irritable 0 0  Afraid - awful might happen 0 0  Total GAD 7 Score 0 0  Anxiety Difficulty Not difficult at all Not difficult at all    Medications: Outpatient Medications Prior to Visit  Medication Sig  . Accu-Chek FastClix Lancets MISC USE TO CHECK GLUCOSE BEFORE MEALS AND AS NEEDED  . acetaminophen  (TYLENOL ) 500 MG tablet Take 1,000 mg by mouth every 6 (six) hours as needed.  . acetaminophen  (TYLENOL ) 500 MG tablet Take 1,000 mg by mouth every 6 (six) hours as needed for mild pain (pain score 1-3).  . allopurinol  (ZYLOPRIM ) 100 MG tablet TAKE 1 TABLET BY MOUTH EVERY DAY  . allopurinol  (ZYLOPRIM ) 100 MG tablet Take 100 mg by mouth daily.  . aspirin  81 MG chewable tablet Chew 1 tablet (81 mg total) by mouth daily.  . B-D UF III MINI PEN NEEDLES 31G X 5 MM MISC USE 1 TO CHECK  BLOOD SUGAR 3 TIMES DAILY  . carvedilol  (COREG ) 3.125 MG tablet Take 1 tablet (3.125 mg total) by mouth 2 (two) times daily.  . clobetasol (TEMOVATE) 0.05 % external solution Apply 1 Application topically 2 (two) times daily as needed.  . Continuous Blood Gluc Receiver (FREESTYLE LIBRE 14 DAY READER) DEVI 1 Device by Does not apply route every 14 (fourteen) days. for type insulin  dependent type 2 diabetes  . Continuous Blood Gluc Sensor (FREESTYLE LIBRE 14 DAY SENSOR) MISC Place 1 Device onto the skin every 14 (fourteen) days. for insulin  dependent type 2 diabetes  . eplerenone  (INSPRA ) 25 MG tablet TAKE 1 TABLET BY MOUTH TWICE A DAY  . folic acid  (FOLVITE ) 1 MG tablet Take 1 tablet (1 mg total) by mouth daily.  . furosemide  (LASIX ) 20 MG tablet  TAKE 2 TABLETS BY MOUTH 2 TIMES DAILY.  . insulin  aspart (NOVOLOG  FLEXPEN) 100 UNIT/ML FlexPen 0-9 Units, Subcutaneous, 3 times daily with meals CBG < 70: Implement Hypoglycemia measures CBG 70 - 120: 0 units CBG 121 - 150: 1 unit CBG 151 - 200: 2 units CBG 201 - 250: 3 units CBG 251 - 300: 5 units CBG 301 - 350: 7 units CBG 351 - 400: 9 units CBG > 400: call MD  . insulin  degludec (TRESIBA  FLEXTOUCH) 100 UNIT/ML FlexTouch Pen INJECT UP TO 20 UNITS UNDER THE SKIN EVERY DAY AT BEDTIME. NEED APPT FOR REFILLS.  . lisinopril  (ZESTRIL ) 5 MG tablet Take 5 mg by mouth daily.  . magnesium  gluconate (MAGONATE) 500 MG tablet Take 500 mg by mouth daily.   . metFORMIN  (GLUCOPHAGE ) 500 MG tablet Take 1 tablet (500 mg total) by mouth 2 (two) times daily with a meal.  . Multiple Vitamin (MULTIVITAMIN WITH MINERALS) TABS tablet Take 1 tablet by mouth daily.  . mupirocin  ointment (BACTROBAN ) 2 % Apply 1 Application topically daily. qd to open sores on scalp  . mupirocin  ointment (BACTROBAN ) 2 % Apply 1 Application topically daily.  . niacin 500 MG tablet Take 500 mg by mouth at bedtime.  . NOVOLOG  FLEXPEN 100 UNIT/ML FlexPen INJECT 20 UNITS UP TO THREE TIMES A DAY BEFORE MEALS. (Patient taking differently: 5-20 Units 3 (three) times daily with meals. Inject 20 units up to three times a day before meals. SS)  . omeprazole  (PRILOSEC) 40 MG capsule TAKE 1 CAPSULE BY MOUTH EVERY DAY  . ONETOUCH VERIO test strip USE TO CHECK SUGAR THREE TIMES DAILY FOR INSULIN  DEPENDENT TYPE 2 DIABETES  . oxyCODONE  (OXY IR/ROXICODONE ) 5 MG immediate release tablet Take 1 tablet (5 mg total) by mouth every 6 (six) hours as needed for severe pain.  . promethazine  (PHENERGAN ) 25 MG tablet Take 1 tablet (25 mg total) by mouth every 8 (eight) hours as needed.  . pyridOXINE (VITAMIN B-6) 50 MG tablet Take 50 mg by mouth daily.  . sertraline  (ZOLOFT ) 100 MG tablet TAKE 1 TABLET BY MOUTH EVERY DAY  . sertraline  (ZOLOFT ) 100 MG tablet Take 100  mg by mouth daily.  . sodium zirconium cyclosilicate  (LOKELMA ) 5 g packet Take 5 g by mouth daily.  . spironolactone  (ALDACTONE ) 100 MG tablet Take 100 mg by mouth daily.  . thiamine  (VITAMIN B-1) 100 MG tablet Take 1 tablet (100 mg total) by mouth daily.  . traMADol  (ULTRAM ) 50 MG tablet TAKE 1 TABLET BY MOUTH EVERY 8 HOURS AS NEEDED.  . traMADol  (ULTRAM ) 50 MG tablet Take 50 mg by mouth every 8 (eight) hours as needed for moderate pain (pain  score 4-6).  Aaron Aas zinc gluconate 50 MG tablet Take 50 mg by mouth daily.  Aaron Aas zinc sulfate, 50mg  elemental zinc, 220 (50 Zn) MG capsule Take 220 mg by mouth daily.  . [DISCONTINUED] levETIRAcetam  (KEPPRA ) 750 MG tablet Take 2 tablets (1,500 mg total) by mouth 2 (two) times daily.   No facility-administered medications prior to visit.    Review of Systems All negative Except see HPI   {Insert previous labs (optional):23779} {See past labs  Heme  Chem  Endocrine  Serology  Results Review (optional):1}   Objective    BP 95/63 (BP Location: Right Arm, Patient Position: Sitting)   Resp 16   Ht 5\' 5"  (1.651 m)   Wt 137 lb (62.1 kg)   SpO2 95%   BMI 22.80 kg/m  {Insert last BP/Wt (optional):23777}{See vitals history (optional):1}   Physical Exam Vitals reviewed.  Constitutional:      General: He is not in acute distress.    Appearance: Normal appearance. He is not diaphoretic.  HENT:     Head: Normocephalic and atraumatic.  Eyes:     General: No scleral icterus.    Conjunctiva/sclera: Conjunctivae normal.  Cardiovascular:     Rate and Rhythm: Normal rate and regular rhythm.     Pulses: Normal pulses.     Heart sounds: Normal heart sounds. No murmur heard. Pulmonary:     Effort: Pulmonary effort is normal. No respiratory distress.     Breath sounds: Normal breath sounds. No wheezing or rhonchi.  Musculoskeletal:     Cervical back: Neck supple.     Right lower leg: No edema.     Left lower leg: No edema.  Lymphadenopathy:      Cervical: No cervical adenopathy.  Skin:    General: Skin is warm and dry.     Findings: No rash.  Neurological:     Mental Status: He is alert and oriented to person, place, and time. Mental status is at baseline.  Psychiatric:        Mood and Affect: Mood normal.        Behavior: Behavior normal.     Results for orders placed or performed in visit on 11/29/23  CBC w/Diff/Platelet  Result Value Ref Range   WBC 8.6 3.4 - 10.8 x10E3/uL   RBC 2.68 (LL) 4.14 - 5.80 x10E6/uL   Hemoglobin 8.8 (L) 13.0 - 17.7 g/dL   Hematocrit 64.4 (L) 03.4 - 51.0 %   MCV 99 (H) 79 - 97 fL   MCH 32.8 26.6 - 33.0 pg   MCHC 33.1 31.5 - 35.7 g/dL   RDW 74.2 59.5 - 63.8 %   Platelets 310 150 - 450 x10E3/uL   Neutrophils 71 Not Estab. %   Lymphs 11 Not Estab. %   Monocytes 14 Not Estab. %   Eos 2 Not Estab. %   Basos 1 Not Estab. %   Neutrophils Absolute 6.2 1.4 - 7.0 x10E3/uL   Lymphocytes Absolute 1.0 0.7 - 3.1 x10E3/uL   Monocytes Absolute 1.2 (H) 0.1 - 0.9 x10E3/uL   EOS (ABSOLUTE) 0.2 0.0 - 0.4 x10E3/uL   Basophils Absolute 0.1 0.0 - 0.2 x10E3/uL   Immature Granulocytes 1 Not Estab. %   Immature Grans (Abs) 0.0 0.0 - 0.1 x10E3/uL  Comprehensive Metabolic Panel (CMET)  Result Value Ref Range   Glucose 254 (H) 70 - 99 mg/dL   BUN 24 8 - 27 mg/dL   Creatinine, Ser 7.56 0.76 - 1.27 mg/dL   eGFR 90 >43  mL/min/1.73   BUN/Creatinine Ratio 27 (H) 10 - 24   Sodium 129 (L) 134 - 144 mmol/L   Potassium 3.9 3.5 - 5.2 mmol/L   Chloride 87 (L) 96 - 106 mmol/L   CO2 22 20 - 29 mmol/L   Calcium 8.6 8.6 - 10.2 mg/dL   Total Protein 6.3 6.0 - 8.5 g/dL   Albumin  3.6 (L) 3.8 - 4.8 g/dL   Globulin, Total 2.7 1.5 - 4.5 g/dL   Bilirubin Total 0.8 0.0 - 1.2 mg/dL   Alkaline Phosphatase 241 (H) 44 - 121 IU/L   AST 18 0 - 40 IU/L   ALT 16 0 - 44 IU/L        Assessment & Plan Type 2 diabetes mellitus with hyperglycemia Elevated glucose levels post-discharge, fasting >200 mg/dL, postprandial up to 409  mg/dL, W1X 9.1%. Previously on Tresiba , held due to hypoglycemia, now on Novolog  and metformin . Plan to reintroduce Tresiba  for fasting glucose control. Continuous glucose monitoring advised. - Reintroduce Tresiba  20 units at bedtime. - Continue metformin  250 mg twice daily. - Continue Novolog  per sliding scale, adjust based on preprandial glucose. - Initiate continuous glucose monitoring with Libre sensor. - Adjust Tresiba  by 2 units every 3-5 days until fasting glucose 80-130 mg/dL. - Adjust Novolog  by 10-20% if preprandial glucose >180 mg/dL. - Monitor for hypoglycemia, reduce doses if fasting glucose <70 mg/dL. - Provide glucose monitor sample and prescribe Libre sensor.  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 No neurology follow-up post-discharge, per confused pt.   - Schedule neurology appointment for stroke follow-up.  Seizures Seizures during recent hospitalization, on Keppra  with three days remaining, no refills. Neurology follow-up needed for seizure management and medication adjustment. - Prescribe Keppra  with refills. - Schedule neurology appointment for seizure management.  Cerebrovascular accident (CVA), unspecified mechanism (HCC) (Primary) - levETIRAcetam  (KEPPRA ) 750 MG tablet; Take 2 tablets (1,500 mg total) by mouth 2 (two) times daily.  Dispense: 120 tablet; Refill: 1 - CBC w/Diff/Platelet - Comprehensive Metabolic Panel (CMET)  Type 2 diabetes mellitus with hyperglycemia, with long-term current use of insulin  (HCC) - Continuous Glucose Sensor (FREESTYLE LIBRE 3 PLUS SENSOR) MISC; Change sensor every 15 days.  Dispense: 2 each; Refill: 11 - CBC w/Diff/Platelet - Comprehensive Metabolic Panel (CMET)   Orders Placed This Encounter  Procedures  . CBC w/Diff/Platelet  . Comprehensive Metabolic Panel (CMET)    Return for chronic disease f/u in 2-4 weeks with PCP.   The patient was advised to call back or seek an in-person evaluation if the  symptoms worsen or if the condition fails to improve as anticipated.  I discussed the assessment and treatment plan with the patient. The patient was provided an opportunity to ask questions and all were answered. The patient agreed with the plan and demonstrated an understanding of the instructions.  I, Camora Tremain, PA-C have reviewed all documentation for this visit. The documentation on 11/29/2023  for the exam, diagnosis, procedures, and orders are all accurate and complete.  Blane Bunting, [facility], MMS [facility] [phone] (phone) [phone] (fax)  [facility]

## 2023-12-02 MED ORDER — LIDOCAINE 5 % EX PTCH: 1.0000 | MEDICATED_PATCH | CUTANEOUS | 0 refills | Status: DC

## 2023-12-02 NOTE — Progress Notes (Signed)
 Established patient visit   Patient: [name]   DOB: June 03, 1951   72 y.o. Male  MRN: [id number] Visit Date: 11/18/2023  Today's healthcare provider: Jeralene Mom, MD   Chief Complaint  Patient presents with   Hospitalization Follow-up   Subjective    Discussed the use of AI scribe software for clinical note transcription with the patient, who gave verbal consent to proceed.  History of Present Illness   [name] is a 73 year old male  who presents for follow-up after hospitalization.  He was hospitalized for nine days due to double pneumonia, which subsequently led to strokes and seizures. He was discharged from the hospital six days ago and has been recovering at home. Since returning home, the stroke has affected his mobility and equilibrium, necessitating the use of a cane.  He has a history of degenerative disc disease and severe arthritis in his lower back. He inquires about a topical cream to manage the pain, preferring topical treatment over oral pain medications.  He is currently taking Keppra  for seizure management, which was initiated during his hospital stay. He also uses Phenergan  for nausea and requests a prescription with refills.  He has been prescribed metformin  500 mg twice a day for diabetes management. However, he has not been checking his blood sugar levels since returning home and has not experienced any symptoms of hypoglycemia.  No recent coughing since his pneumonia has improved and no swelling in his hands or feet.       Medications: Outpatient Medications Prior to Visit  Medication Sig   Accu-Chek FastClix Lancets MISC USE TO CHECK GLUCOSE BEFORE MEALS AND AS NEEDED   acetaminophen  (TYLENOL ) 500 MG tablet Take 1,000 mg by mouth every 6 (six) hours as needed.   allopurinol  (ZYLOPRIM ) 100 MG tablet TAKE 1 TABLET BY MOUTH EVERY DAY   aspirin  81 MG chewable tablet Chew 1 tablet (81 mg total) by mouth daily.   B-D UF III MINI PEN NEEDLES 31G  X 5 MM MISC USE 1 TO CHECK BLOOD SUGAR 3 TIMES DAILY   carvedilol  (COREG ) 3.125 MG tablet Take 1 tablet (3.125 mg total) by mouth 2 (two) times daily.   eplerenone  (INSPRA ) 25 MG tablet TAKE 1 TABLET BY MOUTH TWICE A DAY   furosemide  (LASIX ) 20 MG tablet TAKE 2 TABLETS BY MOUTH 2 TIMES DAILY.   insulin  aspart (NOVOLOG  FLEXPEN) 100 UNIT/ML FlexPen 0-9 Units, Subcutaneous, 3 times daily with meals CBG < 70: Implement Hypoglycemia measures CBG 70 - 120: 0 units CBG 121 - 150: 1 unit CBG 151 - 200: 2 units CBG 201 - 250: 3 units CBG 251 - 300: 5 units CBG 301 - 350: 7 units CBG 351 - 400: 9 units CBG > 400: call MD   insulin  degludec (TRESIBA  FLEXTOUCH) 100 UNIT/ML FlexTouch Pen INJECT UP TO 20 UNITS UNDER THE SKIN EVERY DAY AT BEDTIME. NEED APPT FOR REFILLS.   lisinopril  (ZESTRIL ) 5 MG tablet Take 5 mg by mouth daily.   metFORMIN  (GLUCOPHAGE ) 500 MG tablet Take 1 tablet (500 mg total) by mouth 2 (two) times daily with a meal.   Multiple Vitamin (MULTIVITAMIN WITH MINERALS) TABS tablet Take 1 tablet by mouth daily.   NOVOLOG  FLEXPEN 100 UNIT/ML FlexPen INJECT 20 UNITS UP TO THREE TIMES A DAY BEFORE MEALS. (Patient taking differently: 5-20 Units 3 (three) times daily with meals. Inject 20 units up to three times a day before meals. SS)   pyridOXINE (VITAMIN  B-6) 50 MG tablet Take 50 mg by mouth daily.   spironolactone  (ALDACTONE ) 100 MG tablet Take 100 mg by mouth daily.   zinc sulfate, 50mg  elemental zinc, 220 (50 Zn) MG capsule Take 220 mg by mouth daily.   [DISCONTINUED] levETIRAcetam  (KEPPRA ) 750 MG tablet Take 2 tablets (1,500 mg total) by mouth 2 (two) times daily.   [DISCONTINUED] omeprazole  (PRILOSEC) 40 MG capsule TAKE 1 CAPSULE BY MOUTH EVERY DAY (Patient taking differently: 40 mg every morning. TAKE 1 CAPSULE BY MOUTH EVERY DAY)   [DISCONTINUED] promethazine  (PHENERGAN ) 25 MG tablet TAKE 1 TABLET BY MOUTH EVERY 8 HOURS AS NEEDED FOR NAUSEA OR VOMITING.   acetaminophen  (TYLENOL ) 500 MG tablet  Take 1,000 mg by mouth every 6 (six) hours as needed for mild pain (pain score 1-3).   allopurinol  (ZYLOPRIM ) 100 MG tablet Take 100 mg by mouth daily.   clobetasol (TEMOVATE) 0.05 % external solution Apply 1 Application topically 2 (two) times daily as needed.   Continuous Blood Gluc Receiver (FREESTYLE LIBRE 14 DAY READER) DEVI 1 Device by Does not apply route every 14 (fourteen) days. for type insulin  dependent type 2 diabetes   Continuous Blood Gluc Sensor (FREESTYLE LIBRE 14 DAY SENSOR) MISC Place 1 Device onto the skin every 14 (fourteen) days. for insulin  dependent type 2 diabetes   folic acid  (FOLVITE ) 1 MG tablet Take 1 tablet (1 mg total) by mouth daily.   magnesium  gluconate (MAGONATE) 500 MG tablet Take 500 mg by mouth daily.    mupirocin  ointment (BACTROBAN ) 2 % Apply 1 Application topically daily. qd to open sores on scalp   mupirocin  ointment (BACTROBAN ) 2 % Apply 1 Application topically daily.   niacin 500 MG tablet Take 500 mg by mouth at bedtime.   ONETOUCH VERIO test strip USE TO CHECK SUGAR THREE TIMES DAILY FOR INSULIN  DEPENDENT TYPE 2 DIABETES   oxyCODONE  (OXY IR/ROXICODONE ) 5 MG immediate release tablet Take 1 tablet (5 mg total) by mouth every 6 (six) hours as needed for severe pain.   sertraline  (ZOLOFT ) 100 MG tablet TAKE 1 TABLET BY MOUTH EVERY DAY   sertraline  (ZOLOFT ) 100 MG tablet Take 100 mg by mouth daily.   sodium zirconium cyclosilicate  (LOKELMA ) 5 g packet Take 5 g by mouth daily.   thiamine  (VITAMIN B-1) 100 MG tablet Take 1 tablet (100 mg total) by mouth daily.   traMADol  (ULTRAM ) 50 MG tablet TAKE 1 TABLET BY MOUTH EVERY 8 HOURS AS NEEDED.   traMADol  (ULTRAM ) 50 MG tablet Take 50 mg by mouth every 8 (eight) hours as needed for moderate pain (pain score 4-6).   zinc gluconate 50 MG tablet Take 50 mg by mouth daily.   No facility-administered medications prior to visit.   Review of Systems     Objective    BP 98/71 (BP Location: Left Arm, Patient  Position: Sitting, Cuff Size: Small)   Pulse 67   Resp 16   Wt 135 lb (61.2 kg)   SpO2 99%   BMI 21.79 kg/m   Physical Exam   General appearance: Well developed, well nourished male, cooperative and in no acute distress Head: Normocephalic, without obvious abnormality, atraumatic Respiratory: Respirations even and unlabored, normal respiratory rate Extremities: All extremities are intact.  Skin: Skin color, texture, turgor normal. No rashes seen  Psych: Appropriate mood and affect. Neurologic: Mental status: Alert, oriented to person, place, and time, thought content appropriate.   Assessment & Plan      Follow up CVA Recent stroke  affecting mobility and balance. Uses cane. No physical therapy initiated. - Refer to [facility] for outpatient therapy.  Type 2 Diabetes Mellitus Managed with metformin . No recent glucose monitoring or hypoglycemia. Not checking glucose since discharge. - Continue metformin  500 mg twice daily. - Encourage regular blood glucose monitoring.  Degenerative disc disease and severe arthritis Chronic lower back pain. Prefers topical treatment. - Prescribe lidocaine  patches.  Seizures Seizure disorder managed with Keppra . No recent seizures. - Continue Keppra  as prescribed.  General Health Maintenance Blood work needed post-hospitalization. - Order blood work for blood counts and electrolytes.      Jeralene Mom, MD  [facility] [phone] (phone) [phone] (fax)  [facility]

## 2023-12-03 ENCOUNTER — Encounter: Payer: Self-pay | Admitting: Oncology

## 2023-12-03 ENCOUNTER — Encounter: Payer: Self-pay | Admitting: Physician Assistant

## 2023-12-03 ENCOUNTER — Telehealth: Payer: Self-pay

## 2023-12-03 ENCOUNTER — Other Ambulatory Visit (HOSPITAL_COMMUNITY): Payer: Self-pay

## 2023-12-03 ENCOUNTER — Ambulatory Visit: Payer: Self-pay | Admitting: Physician Assistant

## 2023-12-03 NOTE — Telephone Encounter (Signed)
 Pharmacy Patient Advocate Encounter   Received notification from Onbase that prior authorization for Lidocaine  5% patches is required/requested.   Insurance verification completed.   The patient is insured through [facility] ADVANT[employer]/RX ADVANCE .   Per test claim: Only covered if treating post-herpetic neuralgia. Patient will have to pay out of pocket using good rx.

## 2023-12-03 NOTE — Progress Notes (Signed)
 Please, contact pt and let him know that his hemoglobin/red blood cells are low. He needs to proceed to ER for evaluation and prompt management.

## 2023-12-04 ENCOUNTER — Ambulatory Visit: Attending: Student

## 2023-12-04 DIAGNOSIS — I639 Cerebral infarction, unspecified: Secondary | ICD-10-CM

## 2023-12-04 DIAGNOSIS — I6310 Cerebral infarction due to embolism of unspecified precerebral artery: Secondary | ICD-10-CM

## 2023-12-05 ENCOUNTER — Other Ambulatory Visit: Payer: Self-pay

## 2023-12-05 ENCOUNTER — Emergency Department
Admission: EM | Admit: 2023-12-05 | Discharge: 2023-12-05 | Disposition: A | Discharge status: Home / Self Care | Attending: Emergency Medicine | Admitting: Emergency Medicine

## 2023-12-05 DIAGNOSIS — D649 Anemia, unspecified: Secondary | ICD-10-CM | POA: Diagnosis not present

## 2023-12-05 DIAGNOSIS — I10 Essential (primary) hypertension: Secondary | ICD-10-CM | POA: Diagnosis not present

## 2023-12-05 DIAGNOSIS — R531 Weakness: Secondary | ICD-10-CM | POA: Diagnosis present

## 2023-12-05 LAB — CBC
HCT: 27.4 % — ABNORMAL LOW (ref 39.0–52.0)
Hemoglobin: 9 g/dL — ABNORMAL LOW (ref 13.0–17.0)
MCH: 29.9 pg (ref 26.0–34.0)
MCHC: 32.8 g/dL (ref 30.0–36.0)
MCV: 91 fL (ref 80.0–100.0)
Platelets: 306 10*3/uL (ref 150–400)
RBC: 3.01 MIL/uL — ABNORMAL LOW (ref 4.22–5.81)
RDW: 16.4 % — ABNORMAL HIGH (ref 11.5–15.5)
WBC: 6.8 10*3/uL (ref 4.0–10.5)
nRBC: 0 % (ref 0.0–0.2)

## 2023-12-05 LAB — COMPREHENSIVE METABOLIC PANEL WITH GFR
ALT: 17 U/L (ref 0–44)
AST: 26 U/L (ref 15–41)
Albumin: 3.2 g/dL — ABNORMAL LOW (ref 3.5–5.0)
Alkaline Phosphatase: 169 U/L — ABNORMAL HIGH (ref 38–126)
Anion gap: 10 (ref 5–15)
BUN: 16 mg/dL (ref 8–23)
CO2: 28 mmol/L (ref 22–32)
Calcium: 8.6 mg/dL — ABNORMAL LOW (ref 8.9–10.3)
Chloride: 95 mmol/L — ABNORMAL LOW (ref 98–111)
Creatinine, Ser: 0.81 mg/dL (ref 0.61–1.24)
GFR, Estimated: >60 mL/min (ref >60)
Glucose, Bld: 165 mg/dL — ABNORMAL HIGH (ref 70–99)
Potassium: 4.3 mmol/L (ref 3.5–5.1)
Sodium: 133 mmol/L — ABNORMAL LOW (ref 135–145)
Total Bilirubin: 1 mg/dL (ref 0.0–1.2)
Total Protein: 6.5 g/dL (ref 6.5–8.1)

## 2023-12-05 LAB — URINALYSIS, ROUTINE W REFLEX MICROSCOPIC
Bilirubin Urine: NEGATIVE
Glucose, UA: NEGATIVE mg/dL
Hgb urine dipstick: NEGATIVE
Ketones, ur: NEGATIVE mg/dL
Leukocytes,Ua: NEGATIVE
Nitrite: NEGATIVE
Protein, ur: NEGATIVE mg/dL
Specific Gravity, Urine: 1.014 (ref 1.005–1.030)
pH: 5 (ref 5.0–8.0)

## 2023-12-05 NOTE — ED Triage Notes (Addendum)
 Patient states he was sent over for abnormal labs which include low RBC's and low hgb; patient denies blood in stool but reports feeling weak and short of breath. 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

## 2023-12-05 NOTE — ED Provider Notes (Signed)
 [facility] Provider Note   Event Date/Time   First MD Initiated Contact with Patient 12/05/23 1338     (approximate) History  Weakness  HPI [name] is a 73 y.o. male with a past medical history of cirrhosis, clotting disorder, hypertension, and obesity who presents at the request of his primary care physician due to abnormal labs.  Patient was found to be anemic on last blood draw to 8.8.  Patient states that he had a few weeks of dark tarry stools that have resolved at this point.  Patient states that after those weeks of dark tarry stools he has had worsening generalized weakness and fatigue however denies any dyspnea on exertion, orthostatic lightheadedness, or palpitations.  Patient denies any hematemesis, hematochezia, or melena ROS: Patient currently denies any vision changes, tinnitus, difficulty speaking, facial droop, sore throat, chest pain, shortness of breath, abdominal pain, nausea/vomiting/diarrhea, dysuria, or weakness/numbness/paresthesias in any extremity   Physical Exam  Triage Vital Signs: ED Triage Vitals  Encounter Vitals Group     BP 12/05/23 1211 117/84     Systolic BP Percentile --      Diastolic BP Percentile --      Pulse Rate 12/05/23 1211 69     Resp 12/05/23 1211 18     Temp 12/05/23 1211 97.7 F (36.5 C)     Temp Source 12/05/23 1211 Oral     SpO2 12/05/23 1211 100 %     Weight 12/05/23 1216 136 lb (61.7 kg)     Height 12/05/23 1216 5\' 5"  (1.651 m)     Head Circumference --      Peak Flow --      Pain Score 12/05/23 1216 0     Pain Loc --      Pain Education --      Exclude from Growth Chart --    Most recent vital signs: Vitals:   12/05/23 1211  BP: 117/84  Pulse: 69  Resp: 18  Temp: 97.7 F (36.5 C)  SpO2: 100%   General: Awake, oriented x4. CV:  Good peripheral perfusion. Resp:  Normal effort. Abd:  No distention. Other:  Elderly overweight Caucasian male resting comfortably in no acute distress ED  Results / Procedures / Treatments  Labs (all labs ordered are listed, but only abnormal results are displayed) Labs Reviewed  COMPREHENSIVE METABOLIC PANEL WITH GFR - Abnormal; Notable for the following components:      Result Value   Sodium 133 (*)    Chloride 95 (*)    Glucose, Bld 165 (*)    Calcium 8.6 (*)    Albumin  3.2 (*)    Alkaline Phosphatase 169 (*)    All other components within normal limits  CBC - Abnormal; Notable for the following components:   RBC 3.01 (*)    Hemoglobin 9.0 (*)    HCT 27.4 (*)    RDW 16.4 (*)    All other components within normal limits  URINALYSIS, ROUTINE W REFLEX MICROSCOPIC - Abnormal; Notable for the following components:   Color, Urine YELLOW (*)    APPearance HAZY (*)    All other components within normal limits  CBG MONITORING, ED   EKG ED ECG REPORT I, Charleen Conn, the attending physician, personally viewed and interpreted this ECG. Date: 12/05/2023 EKG Time: 1222 Rate: 66 Rhythm: normal sinus rhythm QRS Axis: normal Intervals: normal ST/T Wave abnormalities: normal Narrative Interpretation: no evidence of acute ischemia PROCEDURES: Critical Care performed: No  Procedures MEDICATIONS ORDERED IN ED: Medications - No data to display IMPRESSION / MDM / ASSESSMENT AND PLAN / ED COURSE  I reviewed the triage vital signs and the nursing notes.                             The patient is on the cardiac monitor to evaluate for evidence of arrhythmia and/or significant heart rate changes. Patient's presentation is most consistent with acute presentation with potential threat to life or bodily function. Patient presents for symptomatic anemia secondary to likely resolved upper GI bleeding. Patient with follow up scheduled.  Patient is not candidate for blood transfusion at this time as well as is a Je[employer]'s [employer] and refused blood products.  I discussed with patient at length the consideration for admission given what sounds like  upper GI bleeding previously.  Patient states that he prefers to be discharged at this time with GI follow-up as he has not had any continued dark tarry stools. Patient feels well on discharge with plan to follow up with GI/PMD.   FINAL CLINICAL IMPRESSION(S) / ED DIAGNOSES   Final diagnoses:  Anemia, unspecified type   Rx / DC Orders   ED Discharge Orders     None      Note:  This document was prepared using Dragon voice recognition software and may include unintentional dictation errors.   Charleen Conn, MD 12/05/23 [phone]

## 2023-12-07 ENCOUNTER — Other Ambulatory Visit: Payer: Self-pay | Admitting: Gastroenterology

## 2023-12-13 ENCOUNTER — Encounter: Payer: Self-pay | Admitting: Family Medicine

## 2023-12-13 ENCOUNTER — Ambulatory Visit (INDEPENDENT_AMBULATORY_CARE_PROVIDER_SITE_OTHER): Admitting: Family Medicine

## 2023-12-13 VITALS — BP 114/85 | HR 78 | Ht 65.0 in | Wt 143.8 lb

## 2023-12-13 DIAGNOSIS — E871 Hypo-osmolality and hyponatremia: Secondary | ICD-10-CM | POA: Diagnosis not present

## 2023-12-13 DIAGNOSIS — G9340 Encephalopathy, unspecified: Secondary | ICD-10-CM | POA: Diagnosis not present

## 2023-12-13 DIAGNOSIS — E1165 Type 2 diabetes mellitus with hyperglycemia: Secondary | ICD-10-CM

## 2023-12-13 DIAGNOSIS — Z794 Long term (current) use of insulin: Secondary | ICD-10-CM

## 2023-12-13 DIAGNOSIS — Z8673 Personal history of transient ischemic attack (TIA), and cerebral infarction without residual deficits: Secondary | ICD-10-CM

## 2023-12-13 DIAGNOSIS — F39 Unspecified mood [affective] disorder: Secondary | ICD-10-CM

## 2023-12-13 DIAGNOSIS — K7031 Alcoholic cirrhosis of liver with ascites: Secondary | ICD-10-CM | POA: Diagnosis not present

## 2023-12-13 DIAGNOSIS — R569 Unspecified convulsions: Secondary | ICD-10-CM | POA: Diagnosis not present

## 2023-12-13 DIAGNOSIS — J690 Pneumonitis due to inhalation of food and vomit: Secondary | ICD-10-CM | POA: Diagnosis not present

## 2023-12-13 DIAGNOSIS — E875 Hyperkalemia: Secondary | ICD-10-CM | POA: Diagnosis not present

## 2023-12-13 MED ORDER — IRON (FERROUS SULFATE) 325 (65 FE) MG PO TABS: 325.0000 mg | ORAL_TABLET | Freq: Every day | ORAL | Status: AC

## 2023-12-13 NOTE — Progress Notes (Signed)
 Established patient visit   Patient: [name]   DOB: 1950/09/03   73 y.o. Male  MRN: [id number] Visit Date: 12/13/2023  Today's healthcare provider: Jeralene Mom, MD   Chief Complaint  Patient presents with   Medical Management of Chronic Issues   Diabetes   Subjective    Discussed the use of AI scribe software for clinical note transcription with the patient, who gave verbal consent to proceed.  History of Present Illness   [name] is a 73 year old male with type 2 diabetes, liver cirrhosis, and recent CVS who presents for medication management and follow-up on abdominal swelling.  He requires a refill for his acid reflux medication, Prilosec, as he has run out and has no refills available. He is uncertain about his current iron supplementation for anemia and is not taking any iron supplements at present.  His wife told him to ask about increasing his sertraline  dosage. He describes feeling 'short-tempered' and acknowledges snapping at [facility], which he regrets. He is worried about his mood and is open to adjusting his medication if necessary.  He had a CVS stroke in April and has a follow-up appointment with a neurologist scheduled in July . He states he was discharged with order for a heart monitor that he tried to use but did not work properly due to poor skin connection, despite being tightly secured. He still has it and is going to send it back in anyway  He is experiencing abdominal swelling and weight gain, particularly in his stomach, which feels 'tight' and 'hard'. He has gained about ten pounds, all seemingly in his stomach. He states he is supposed to have an endoscopy due to concerns about possible bleeding in his esophagus. He is currently taking furosemide  (Lasix ) and spironolactone .     Lab Results  Component Value Date   NA 133 (L) 12/05/2023   K 4.3 12/05/2023   CREATININE 0.81 12/05/2023   GFRNONAA >60 12/05/2023   GLUCOSE 165 (H) 12/05/2023   Lab  Results  Component Value Date   CHOL 39 11/02/2023   HDL 17 (L) 11/02/2023   LDLCALC 15 11/02/2023   TRIG 37 11/02/2023   CHOLHDL 2.3 11/02/2023   Lab Results  Component Value Date   WBC 6.8 12/05/2023   HGB 9.0 (L) 12/05/2023   HCT 27.4 (L) 12/05/2023   MCV 91.0 12/05/2023   PLT 306 12/05/2023   Lab Results  Component Value Date   IRON 144 10/02/2023   TIBC 358 10/02/2023   FERRITIN 99 10/02/2023     Medications: Outpatient Medications Prior to Visit  Medication Sig   Accu-Chek FastClix Lancets MISC USE TO CHECK GLUCOSE BEFORE MEALS AND AS NEEDED   acetaminophen  (TYLENOL ) 500 MG tablet Take 1,000 mg by mouth every 6 (six) hours as needed.   allopurinol  (ZYLOPRIM ) 100 MG tablet TAKE 1 TABLET BY MOUTH EVERY DAY   aspirin  81 MG chewable tablet Chew 1 tablet (81 mg total) by mouth daily.   B-D UF III MINI PEN NEEDLES 31G X 5 MM MISC USE 1 TO CHECK BLOOD SUGAR 3 TIMES DAILY   carvedilol  (COREG ) 3.125 MG tablet TAKE 1 TABLET BY MOUTH 2 TIMES DAILY.   clobetasol (TEMOVATE) 0.05 % external solution Apply 1 Application topically 2 (two) times daily as needed.   Continuous Blood Gluc Receiver (FREESTYLE LIBRE 14 DAY READER) DEVI 1 Device by Does not apply route every 14 (fourteen) days. for type insulin  dependent type  2 diabetes   Continuous Glucose Sensor (FREESTYLE LIBRE 3 PLUS SENSOR) MISC Change sensor every 15 days.   eplerenone  (INSPRA ) 25 MG tablet TAKE 1 TABLET BY MOUTH TWICE A DAY   furosemide  (LASIX ) 20 MG tablet TAKE 2 TABLETS BY MOUTH 2 TIMES DAILY.   insulin  aspart (NOVOLOG  FLEXPEN) 100 UNIT/ML FlexPen 0-9 Units, Subcutaneous, 3 times daily with meals CBG < 70: Implement Hypoglycemia measures CBG 70 - 120: 0 units CBG 121 - 150: 1 unit CBG 151 - 200: 2 units CBG 201 - 250: 3 units CBG 251 - 300: 5 units CBG 301 - 350: 7 units CBG 351 - 400: 9 units CBG > 400: call MD   insulin  degludec (TRESIBA  FLEXTOUCH) 100 UNIT/ML FlexTouch Pen INJECT UP TO 20 UNITS UNDER THE SKIN EVERY  DAY AT BEDTIME. NEED APPT FOR REFILLS.   levETIRAcetam  (KEPPRA ) 750 MG tablet Take 2 tablets (1,500 mg total) by mouth 2 (two) times daily.   lidocaine  (LIDODERM ) 5 % Place 1 patch onto the skin daily. Remove & Discard patch within 12 hours or as directed by MD   lisinopril  (ZESTRIL ) 5 MG tablet Take 5 mg by mouth daily.   magnesium  gluconate (MAGONATE) 500 MG tablet Take 500 mg by mouth daily.    Multiple Vitamin (MULTIVITAMIN WITH MINERALS) TABS tablet Take 1 tablet by mouth daily.   niacin 500 MG tablet Take 500 mg by mouth at bedtime.   NOVOLOG  FLEXPEN 100 UNIT/ML FlexPen INJECT 20 UNITS UP TO THREE TIMES A DAY BEFORE MEALS. (Patient taking differently: 5-20 Units 3 (three) times daily with meals. Inject 20 units up to three times a day before meals. SS)   omeprazole  (PRILOSEC) 40 MG capsule TAKE 1 CAPSULE BY MOUTH EVERY DAY   ONETOUCH VERIO test strip USE TO CHECK SUGAR THREE TIMES DAILY FOR INSULIN  DEPENDENT TYPE 2 DIABETES   promethazine  (PHENERGAN ) 25 MG tablet Take 1 tablet (25 mg total) by mouth every 8 (eight) hours as needed.   pyridOXINE (VITAMIN B-6) 50 MG tablet Take 50 mg by mouth daily.   sertraline  (ZOLOFT ) 100 MG tablet TAKE 1 TABLET BY MOUTH EVERY DAY   sodium zirconium cyclosilicate  (LOKELMA ) 5 g packet Take 5 g by mouth daily. (Does not appear to be taking)   thiamine  (VITAMIN B-1) 100 MG tablet Take 1 tablet (100 mg total) by mouth daily.   traMADol  (ULTRAM ) 50 MG tablet TAKE 1 TABLET BY MOUTH EVERY 8 HOURS AS NEEDED.   zinc gluconate 50 MG tablet Take 50 mg by mouth daily.   zinc sulfate, 50mg  elemental zinc, 220 (50 Zn) MG capsule Take 220 mg by mouth daily.   folic acid  (FOLVITE ) 1 MG tablet Take 1 tablet (1 mg total) by mouth daily.   metFORMIN  (GLUCOPHAGE ) 500 MG tablet Take 1 tablet (500 mg total) by mouth 2 (two) times daily with a meal.   No facility-administered medications prior to visit.   Review of Systems  Constitutional:  Negative for appetite change,  chills and fever.  Respiratory:  Negative for chest tightness, shortness of breath and wheezing.   Cardiovascular:  Negative for chest pain and palpitations.  Gastrointestinal:  Negative for abdominal pain, nausea and vomiting.       Objective    BP 114/85 (BP Location: Left Arm, Patient Position: Sitting, Cuff Size: Normal)   Pulse 78   Ht 5\' 5"  (1.651 m)   Wt 143 lb 12.8 oz (65.2 kg)   SpO2 100%   BMI 23.93 kg/m  Physical Exam   General: Appearance:    Well developed, well nourished male in no acute distress  Eyes:    PERRL, conjunctiva/corneas clear, EOM's intact       Lungs:     Clear to auscultation bilaterally, respirations unlabored  Heart:    Normal heart rate. Normal rhythm. No murmurs, rubs, or gallops.    Abd:   Moderately distended abdomen with ascites, no tenderness  Neurologic:   Awake, alert, oriented x 3. No apparent focal neurological defect.         Assessment & Plan     1. History of cerebrovascular accident (CVA) in adulthood (Primary) Subacute cerebellar infarct on imaging in April. Neuro follow up scheduled in July. No apparent sequela today.   2. Seizures (HCC) Continue Keppra  and follow up neurology as scheduled.   3. Acute encephalopathy Resolved, in setting of aspiration pneumonia in April.   4. Type 2 diabetes mellitus with hyperglycemia, with long-term current use of insulin  (HCC) Continue current medications - Urine Albumin /Creatinine with ratio (send out) [LAB689] - Renal function panel  5. Alcoholic cirrhosis of liver with ascites (HCC) Tolerating current dose of eplernone and furosemide , but with mild hyponatremia as below. Continue current medications.    6. Aspiration pneumonia of both lower lobes, unspecified aspiration pneumonia type (HCC) resolved  7. Mood disorder [facility]) He reports his wife concerned about him easily getting upset and angry and want to increase sertraline . Advised that SSRI sertraline  dosing may be limited by  low sodium which we are checking today.   8. Hyperkalemia Was corrected with Lokelma , but seems to be out of medication at this time.   9. Hyponatremia Checking renal panel oday  10. Iron deficiency anemia  Iron, Ferrous Sulfate, 325 (65 Fe) MG TABS; Take 325 mg by mouth daily.   Addressed extensive list of chronic and acute medical problems today requiring 55 minutes reviewing his medical record, counseling patient regarding his conditions and coordination of care.     Jeralene Mom, MD  [facility] [phone] (phone) [phone] (fax)  [facility]

## 2023-12-13 NOTE — Patient Instructions (Signed)
 Marland Kitchen  Please review the attached list of medications and notify my office if there are any errors.   . Please bring all of your medications to every appointment so we can make sure that our medication list is the same as yours.

## 2023-12-14 ENCOUNTER — Other Ambulatory Visit: Payer: Self-pay | Admitting: Family Medicine

## 2023-12-14 DIAGNOSIS — E1165 Type 2 diabetes mellitus with hyperglycemia: Secondary | ICD-10-CM

## 2023-12-14 LAB — RENAL FUNCTION PANEL
Albumin: 3.3 g/dL — ABNORMAL LOW (ref 3.8–4.8)
BUN/Creatinine Ratio: 13 (ref 10–24)
BUN: 9 mg/dL (ref 8–27)
CO2: 27 mmol/L (ref 20–29)
Calcium: 8.2 mg/dL — ABNORMAL LOW (ref 8.6–10.2)
Chloride: 92 mmol/L — ABNORMAL LOW (ref 96–106)
Creatinine, Ser: 0.72 mg/dL — ABNORMAL LOW (ref 0.76–1.27)
Glucose: 210 mg/dL — ABNORMAL HIGH (ref 70–99)
Phosphorus: 3.5 mg/dL (ref 2.8–4.1)
Potassium: 3.6 mmol/L (ref 3.5–5.2)
Sodium: 133 mmol/L — ABNORMAL LOW (ref 134–144)
eGFR: 96 mL/min/{1.73_m2} (ref >59)

## 2023-12-14 LAB — MICROALBUMIN / CREATININE URINE RATIO
Creatinine, Urine: 255.2 mg/dL
Microalb/Creat Ratio: 6 mg/g{creat} (ref 0–29)
Microalbumin, Urine: 16.5 ug/mL

## 2023-12-22 ENCOUNTER — Other Ambulatory Visit: Payer: Self-pay | Admitting: Family Medicine

## 2023-12-22 DIAGNOSIS — E1165 Type 2 diabetes mellitus with hyperglycemia: Secondary | ICD-10-CM

## 2023-12-23 ENCOUNTER — Ambulatory Visit: Payer: Self-pay

## 2023-12-23 ENCOUNTER — Other Ambulatory Visit: Payer: Self-pay

## 2023-12-23 ENCOUNTER — Emergency Department

## 2023-12-23 ENCOUNTER — Emergency Department
Admission: EM | Admit: 2023-12-23 | Discharge: 2023-12-23 | Disposition: A | Discharge status: Home / Self Care | Attending: Emergency Medicine | Admitting: Emergency Medicine

## 2023-12-23 DIAGNOSIS — R188 Other ascites: Secondary | ICD-10-CM | POA: Diagnosis not present

## 2023-12-23 DIAGNOSIS — R0602 Shortness of breath: Secondary | ICD-10-CM | POA: Diagnosis not present

## 2023-12-23 DIAGNOSIS — R0989 Other specified symptoms and signs involving the circulatory and respiratory systems: Secondary | ICD-10-CM | POA: Diagnosis not present

## 2023-12-23 DIAGNOSIS — R918 Other nonspecific abnormal finding of lung field: Secondary | ICD-10-CM | POA: Diagnosis not present

## 2023-12-23 LAB — CBC
HCT: 28.1 % — ABNORMAL LOW (ref 39.0–52.0)
Hemoglobin: 8.8 g/dL — ABNORMAL LOW (ref 13.0–17.0)
MCH: 27.2 pg (ref 26.0–34.0)
MCHC: 31.3 g/dL (ref 30.0–36.0)
MCV: 86.7 fL (ref 80.0–100.0)
Platelets: 205 10*3/uL (ref 150–400)
RBC: 3.24 MIL/uL — ABNORMAL LOW (ref 4.22–5.81)
RDW: 18 % — ABNORMAL HIGH (ref 11.5–15.5)
WBC: 4.6 10*3/uL (ref 4.0–10.5)
nRBC: 0 % (ref 0.0–0.2)

## 2023-12-23 LAB — BASIC METABOLIC PANEL WITH GFR
Anion gap: 9 (ref 5–15)
BUN: 10 mg/dL (ref 8–23)
CO2: 28 mmol/L (ref 22–32)
Calcium: 8.4 mg/dL — ABNORMAL LOW (ref 8.9–10.3)
Chloride: 98 mmol/L (ref 98–111)
Creatinine, Ser: 0.68 mg/dL (ref 0.61–1.24)
GFR, Estimated: >60 mL/min (ref >60)
Glucose, Bld: 142 mg/dL — ABNORMAL HIGH (ref 70–99)
Potassium: 3.5 mmol/L (ref 3.5–5.1)
Sodium: 135 mmol/L (ref 135–145)

## 2023-12-23 LAB — BRAIN NATRIURETIC PEPTIDE: B Natriuretic Peptide: 126.3 pg/mL — ABNORMAL HIGH (ref 0.0–100.0)

## 2023-12-23 LAB — TROPONIN I (HIGH SENSITIVITY): Troponin I (High Sensitivity): 5 ng/L (ref <18)

## 2023-12-23 NOTE — Discharge Instructions (Signed)
 Please call your doctors office tomorrow so they can help order and schedule a paracentesis (the procedure that drains fluid from your abdomen). Please seek medical attention for any severe pain, fevers, persistent vomiting or any other new or concerning symptoms.

## 2023-12-23 NOTE — Telephone Encounter (Signed)
 Lost connection during call. Able to reconnect with Pt and he stated ill get some one to take me to ED today. RN advised after ED visit to call back and make PCP f/u appt. Pt verbalized understanding,

## 2023-12-23 NOTE — ED Provider Notes (Signed)
 [facility] Provider Note    Event Date/Time   First MD Initiated Contact with Patient 12/23/23 1807     (approximate)   History   Abdominal distention   HPI  [name] is a 73 y.o. male who presents to the urgency department today because of concerns for abdominal distention.  He states he has been getting worse for the past few weeks.  He has gained roughly 20 pounds.  He would like the fluid drained.  He has had fluid drained from his abdomen in the past.  That he says this is secondary to alcohol use.  Did use alcohol again although stopped about 5 weeks ago.  Patient states he does have some shortness of breath with exertion but feels like could be due to his current anemia.  He denies any recent black or tarry stools.  He denies any significant pain with the abdominal distention although states he has noticed some discomfort when he bends over.  He had called his primary care doctor's office to sent him to the emergency department.     Physical Exam   Triage Vital Signs: ED Triage Vitals  Encounter Vitals Group     BP 12/23/23 1142 115/67     Girls Systolic BP Percentile --      Girls Diastolic BP Percentile --      Boys Systolic BP Percentile --      Boys Diastolic BP Percentile --      Pulse Rate 12/23/23 1142 67     Resp 12/23/23 1142 18     Temp 12/23/23 1142 98 F (36.7 C)     Temp Source 12/23/23 1535 Oral     SpO2 12/23/23 1142 100 %     Weight 12/23/23 1140 145 lb (65.8 kg)     Height 12/23/23 1140 5' 5 (1.651 m)     Head Circumference --      Peak Flow --      Pain Score 12/23/23 1140 4     Pain Loc --      Pain Education --      Exclude from Growth Chart --     Most recent vital signs: Vitals:   12/23/23 1142 12/23/23 1535  BP: 115/67 123/85  Pulse: 67 60  Resp: 18 18  Temp: 98 F (36.7 C) 97.9 F (36.6 C)  SpO2: 100% 100%   General: Awake, alert, oriented. CV:  Good peripheral perfusion. Regular rate and  rhythm. Resp:  Normal effort. Lungs clear. Abd:  Distended, non tender.  Ext:  No lower extremity edema.   ED Results / Procedures / Treatments   Labs (all labs ordered are listed, but only abnormal results are displayed) Labs Reviewed  BASIC METABOLIC PANEL WITH GFR - Abnormal; Notable for the following components:      Result Value   Glucose, Bld 142 (*)    Calcium 8.4 (*)    All other components within normal limits  CBC - Abnormal; Notable for the following components:   RBC 3.24 (*)    Hemoglobin 8.8 (*)    HCT 28.1 (*)    RDW 18.0 (*)    All other components within normal limits  BRAIN NATRIURETIC PEPTIDE  TROPONIN I (HIGH SENSITIVITY)     EKG  I, Marylynn Soho, attending physician, personally viewed and interpreted this EKG  EKG Time: 1152 Rate: 60 Rhythm: sinus rhythm Axis: left axis deviation Intervals: qtc 448 QRS: narrow, q waves III, aVF  ST changes: no st elevation Impression: abnormal ekg   RADIOLOGY I independently interpreted and visualized the CXR. My interpretation: No pneumonia Radiology interpretation:  IMPRESSION:  Low lung volumes with bronchovascular crowding. Bibasilar linear and  patchy opacities, left-greater-than-right, likely atelectasis.      PROCEDURES:  Critical Care performed: No    MEDICATIONS ORDERED IN ED: Medications - No data to display   IMPRESSION / MDM / ASSESSMENT AND PLAN / ED COURSE  I reviewed the triage vital signs and the nursing notes.                              Differential diagnosis includes, but is not limited to, ascites, CHF  Patient's presentation is most consistent with acute presentation with potential threat to life or bodily function.   Patient presented to the emergency department today because of concerns for abdominal distention and fluid.  On exam his abdomen is distended however it is nontender.  No lower extremity edema.  Blood work with a very slight BNP elevation.  At this time I  think likely ascites given the patient's clinical history of the same.  No significant shortness of breath.  At this time do not feel emergent paracentesis is necessary, nor is there any fever, pain or leukocytosis to suggest SBP. Did discuss with patient that he should contact his primary care tomorrow to help order outpatient paracentesis.     FINAL CLINICAL IMPRESSION(S) / ED DIAGNOSES   Final diagnoses:  Other ascites      Note:  This document was prepared using Dragon voice recognition software and may include unintentional dictation errors.    Marylynn Soho, MD 12/23/23 [phone]

## 2023-12-23 NOTE — ED Notes (Signed)
 Full rainbow sent to lab.

## 2023-12-23 NOTE — Telephone Encounter (Signed)
 FYI Only or Action Required?: Action required by provider  Patient was last seen in primary care on 12/13/2023 by Lamon Pillow, MD. Called Nurse Triage reporting Bloated. Symptoms began several weeks ago. Interventions attempted: Nothing. Symptoms are: gradually worsening.  Triage Disposition: Go to ED Now (Notify PCP)  Patient/caregiver understands and will follow disposition?: YesCopied from CRM [id number]. Topic: Clinical - Red Word Triage >> Dec 23, 2023 10:25 AM Emylou G wrote: [facility] that prompted transfer to Nurse Triage: Swelling in his stomach.. up almost 20 pounds.. his body is hurting - retaining fluid and has hernia on his right side.. Reason for Disposition  [1] MODERATE-SEVERE SWELLING of abdomen (e.g., looks very distended or swollen) AND [2] NEW-onset or much worse AND [3] vomiting  Answer Assessment - Initial Assessment Questions 1. SYMPTOM: What's the main symptom you're concerned about? (e.g., abdomen bloating, swelling)     swelling 2. ONSET: When did swelling   start?     2-3 weesk ago  3. SEVERITY: How bad is the bloating or swelling?    - BLOATING: Feels gassy or bloated. No visible swelling.     - MILD SWELLING: Feels gassy or bloated. Abdomen looks mildly distended or swollen.    - MODERATE - SEVERE SWELLING: Abdomen looks very distended or swollen.      severe 4. ABDOMEN PAIN:  Is there any abdomen pain? If Yes, ask: How bad is the pain?  (e.g., Scale 1-10; mild, moderate, or severe)   - NONE (0): No pain.   - MILD (1-3): Doesn't interfere with normal activities, abdomen soft and not tender to touch.    - MODERATE (4-7): Interferes with normal activities or awakens from sleep, abdomen tender to touch.    - SEVERE (8-10): Excruciating pain, doubled over, unable to do any normal activities.       Severe swelling in stomach  5. RELIEVING AND AGGRAVATING FACTORS: What makes it better or worse? (e.g., certain foods, lactose, medicines)     Drinking  alcohol several weeks ago 6. GI HISTORY: Do you have any history of stomach or intestine problems? (e.g., bowel obstruction, cancer, irritable bowel)      Yes, but not sure what it is  7. CAUSE: What do you think is causing the bloating?      Not sure  8. OTHER SYMPTOMS: Do you have any other symptoms? (e.g., belching, blood in stool, breathing difficulty, constipation, diarrhea, fever, passing gas, vomiting, weight loss, white of eyes have turned yellow)     SOB while walking at times. Sclera has a yellow tint.      Pt stated he had 15 pounds of fluid on abd over last 2-3 weeks. Pt stated I look 8 months pregnant. PT can't bend over. Lying flat has no pain. Walking causes pain at times.  Protocols used: Abdomen Bloating and Swelling-A-AH

## 2023-12-23 NOTE — ED Triage Notes (Addendum)
 Pt comes with c/o sob and fluid retention. Pt states he is anemic. Pt states this started two -three weeks ago. Pt states weight increase of lb a day. Pt has distended abdomen and hard. Pt states that is not normal.

## 2023-12-24 ENCOUNTER — Ambulatory Visit: Payer: Self-pay

## 2023-12-24 NOTE — Telephone Encounter (Signed)
 FYI Only or Action Required?: Action required by provider- patient requesting order for outpatient paracentesis per ED visit yesterday  Patient was last seen in primary care on 12/13/2023 by Lamon Pillow, MD. Called Nurse Triage reporting abdominal swelling. Symptoms began several weeks ago. Interventions attempted: OTC medications: gas relief medication, Rest, hydration, or home remedies, and Ice/heat application. Symptoms are: gas, belching, abdominal bloating/swelling, intermittent abdominal pain, bilateral ankle swelling, SOB with exertion, (now resolved, several weeks ago) black tarry stool (on iron ) gradually worsening.  Triage Disposition: See PCP When Office is Open (Within 3 Days) (overriding See HCP Within 4 Hours (Or PCP Triage))  Patient/caregiver understands and will follow disposition?: Yes                Reason for Disposition  [1] MODERATE-SEVERE SWELLING of abdomen (e.g., looks very distended or swollen) AND [2] NEW-onset or much worse  Answer Assessment - Initial Assessment Questions 1. SYMPTOM: What's the main symptom you're concerned about? (e.g., abdomen bloating, swelling)     Abdominal swelling/ascites.  2. ONSET: When did swelling  start?     X 3-4 weeks, gradually worsening. He states he has gained about 18 pounds.  3. SEVERITY: How bad is the bloating or swelling?    - BLOATING: Feels gassy or bloated. No visible swelling.     - MILD SWELLING: Feels gassy or bloated. Abdomen looks mildly distended or swollen.    - MODERATE - SEVERE SWELLING: Abdomen looks very distended or swollen.      He states it looks about 8 months pregnant.  4. ABDOMEN PAIN:  Is there any abdomen pain? If Yes, ask: How bad is the pain?  (e.g., Scale 1-10; mild, moderate, or severe)   - NONE (0): No pain.   - MILD (1-3): Doesn't interfere with normal activities, abdomen soft and not tender to touch.    - MODERATE (4-7): Interferes with normal activities or awakens  from sleep, abdomen tender to touch.    - SEVERE (8-10): Excruciating pain, doubled over, unable to do any normal activities.      0/10 at rest. He states his abdomen hurts if he gets up and walks around/moves 5-6/10. Intermittent.  5. RELIEVING AND AGGRAVATING FACTORS: What makes it better or worse? (e.g., certain foods, lactose, medicines)     Heat helps the pain.  6. GI HISTORY: Do you have any history of stomach or intestine problems? (e.g., bowel obstruction, cancer, irritable bowel)      Esophageal varices, cirrhosis, esophagitis, gerd, portal hypertension.  7. CAUSE: What do you think is causing the bloating?      Ascites.  8. OTHER SYMPTOMS: Do you have any other symptoms? (e.g., belching, blood in stool, breathing difficulty, constipation, diarrhea, fever, passing gas, vomiting, weight loss, white of eyes have turned yellow)     Bilateral ankle swelling, right lower groin hernia, passing gas, belching, SOB with exertion, black tarry stool several weeks ago  9. PREGNANCY: Is there any chance you are pregnant? When was your last menstrual period?     N/A.  Patient denies chest pain, vomiting, diarrhea, fever, blood in stool.  Protocols used: Abdomen Bloating and Swelling-A-AH

## 2023-12-25 ENCOUNTER — Ambulatory Visit: Payer: PPO | Admitting: Dermatology

## 2023-12-26 ENCOUNTER — Ambulatory Visit: Admitting: Physician Assistant

## 2023-12-26 ENCOUNTER — Encounter: Payer: Self-pay | Admitting: Physician Assistant

## 2023-12-26 VITALS — BP 116/76 | HR 77 | Temp 98.0°F | Wt 150.7 lb

## 2023-12-26 DIAGNOSIS — R011 Cardiac murmur, unspecified: Secondary | ICD-10-CM

## 2023-12-26 DIAGNOSIS — R0602 Shortness of breath: Secondary | ICD-10-CM

## 2023-12-26 DIAGNOSIS — F1021 Alcohol dependence, in remission: Secondary | ICD-10-CM | POA: Diagnosis not present

## 2023-12-26 DIAGNOSIS — Z794 Long term (current) use of insulin: Secondary | ICD-10-CM | POA: Diagnosis not present

## 2023-12-26 DIAGNOSIS — E1165 Type 2 diabetes mellitus with hyperglycemia: Secondary | ICD-10-CM | POA: Diagnosis not present

## 2023-12-26 DIAGNOSIS — K7030 Alcoholic cirrhosis of liver without ascites: Secondary | ICD-10-CM | POA: Diagnosis not present

## 2023-12-26 DIAGNOSIS — K7031 Alcoholic cirrhosis of liver with ascites: Secondary | ICD-10-CM

## 2023-12-26 NOTE — Progress Notes (Signed)
 Established patient visit  Patient: [name]   DOB: [birth year]   73 y.o. Male  MRN: [id number] Visit Date: 12/26/2023  Today's healthcare provider: Blane Bunting, PA-C   No chief complaint on file.  Subjective       Discussed the use of AI scribe software for clinical note transcription with the patient, who gave verbal consent to proceed.  History of Present Illness [name] is a 73 year old male who presents with abdominal distention.  He has experienced worsening abdominal distention over the past few weeks. Recent blood work showed a slight BNP elevation. He is taking furosemide  (Lasix ) twice daily.  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 He has abstained from alcohol for the past two weeks.  He experiences shortness of breath with exertion, such as walking 200 feet to his mailbox. There is no chest pain or palpitations at rest, but shortness of breath occurs upon exertion.       11/29/2023    2:10 PM 04/19/2023    1:15 PM 03/19/2023    1:42 PM  Depression screen PHQ 2/9  Decreased Interest 0 0 0  Down, Depressed, Hopeless 0 0 0  PHQ - 2 Score 0 0 0  Altered sleeping 0 0   Tired, decreased energy 1 1   Change in appetite 1 0   Feeling bad or failure about yourself  0 0   Trouble concentrating 0 0   Moving slowly or fidgety/restless 0 0   Suicidal thoughts 0 0   PHQ-9 Score 2 1   Difficult doing work/chores Not difficult at all Not difficult at all       11/29/2023    2:11 PM 04/19/2023    1:15 PM  GAD 7 : Generalized Anxiety Score  Nervous, Anxious, on Edge 0 0  Control/stop worrying 0 0  Worry too much - different things 0 0  Trouble relaxing 0 0  Restless 0 0  Easily annoyed or irritable 0 0  Afraid - awful might happen 0 0  Total GAD 7 Score 0 0  Anxiety Difficulty Not difficult at all Not  difficult at all    Medications: Outpatient Medications Prior to Visit  Medication Sig   Accu-Chek FastClix Lancets MISC USE TO CHECK GLUCOSE BEFORE MEALS AND AS NEEDED   acetaminophen  (TYLENOL ) 500 MG tablet Take 1,000 mg by mouth every 6 (six) hours as needed.   allopurinol  (ZYLOPRIM ) 100 MG tablet TAKE 1 TABLET BY MOUTH EVERY DAY   aspirin  81 MG chewable tablet Chew 1 tablet (81 mg total) by mouth daily.   BD PEN NEEDLE MINI ULTRAFINE 31G X 5 MM MISC USE 1 TO CHECK BLOOD SUGAR 3 TIMES DAILY   carvedilol  (COREG ) 3.125 MG tablet TAKE 1 TABLET BY MOUTH 2 TIMES DAILY.   clobetasol (TEMOVATE) 0.05 % external solution Apply 1 Application topically 2 (two) times daily as needed.   Continuous Blood Gluc Receiver (FREESTYLE LIBRE 14 DAY READER) DEVI 1 Device by Does not apply route every 14 (fourteen) days. for type insulin  dependent type 2 diabetes   Continuous Glucose Sensor (FREESTYLE LIBRE 3 PLUS SENSOR) MISC Change sensor every 15 days.   eplerenone  (INSPRA ) 25 MG tablet TAKE 1 TABLET BY MOUTH TWICE A DAY   folic acid  (FOLVITE ) 1 MG tablet Take 1 tablet (1 mg total) by  mouth daily.   furosemide  (LASIX ) 20 MG tablet TAKE 2 TABLETS BY MOUTH 2 TIMES DAILY.   insulin  aspart (NOVOLOG  FLEXPEN) 100 UNIT/ML FlexPen 0-9 Units, Subcutaneous, 3 times daily with meals CBG < 70: Implement Hypoglycemia measures CBG 70 - 120: 0 units CBG 121 - 150: 1 unit CBG 151 - 200: 2 units CBG 201 - 250: 3 units CBG 251 - 300: 5 units CBG 301 - 350: 7 units CBG 351 - 400: 9 units CBG > 400: call MD   insulin  degludec (TRESIBA  FLEXTOUCH) 100 UNIT/ML FlexTouch Pen INJECT UP TO 20 UNITS UNDER THE SKIN EVERY DAY AT BEDTIME. NEED APPT FOR REFILLS.   Iron , Ferrous Sulfate , 325 (65 Fe) MG TABS Take 325 mg by mouth daily.   levETIRAcetam  (KEPPRA ) 750 MG tablet Take 2 tablets (1,500 mg total) by mouth 2 (two) times daily.   lidocaine  (LIDODERM ) 5 % Place 1 patch onto the skin daily. Remove & Discard patch within 12 hours or as  directed by MD   lisinopril  (ZESTRIL ) 5 MG tablet Take 5 mg by mouth daily.   magnesium  gluconate (MAGONATE) 500 MG tablet Take 500 mg by mouth daily.    Multiple Vitamin (MULTIVITAMIN WITH MINERALS) TABS tablet Take 1 tablet by mouth daily.   niacin 500 MG tablet Take 500 mg by mouth at bedtime.   NOVOLOG  FLEXPEN 100 UNIT/ML FlexPen INJECT 20 UNITS UP TO THREE TIMES A DAY BEFORE MEALS. (Patient taking differently: 5-20 Units 3 (three) times daily with meals. Inject 20 units up to three times a day before meals. SS)   omeprazole  (PRILOSEC) 40 MG capsule TAKE 1 CAPSULE BY MOUTH EVERY DAY   ONETOUCH VERIO test strip USE TO CHECK SUGAR THREE TIMES DAILY FOR INSULIN  DEPENDENT TYPE 2 DIABETES   promethazine  (PHENERGAN ) 25 MG tablet Take 1 tablet (25 mg total) by mouth every 8 (eight) hours as needed.   pyridOXINE (VITAMIN B-6) 50 MG tablet Take 50 mg by mouth daily.   sertraline  (ZOLOFT ) 100 MG tablet TAKE 1 TABLET BY MOUTH EVERY DAY   sodium zirconium cyclosilicate  (LOKELMA ) 5 g packet Take 5 g by mouth daily.   thiamine  (VITAMIN B-1) 100 MG tablet Take 1 tablet (100 mg total) by mouth daily.   traMADol  (ULTRAM ) 50 MG tablet TAKE 1 TABLET BY MOUTH EVERY 8 HOURS AS NEEDED.   zinc gluconate 50 MG tablet Take 50 mg by mouth daily.   zinc sulfate, 50mg  elemental zinc, 220 (50 Zn) MG capsule Take 220 mg by mouth daily.   metFORMIN  (GLUCOPHAGE ) 500 MG tablet Take 1 tablet (500 mg total) by mouth 2 (two) times daily with a meal.   No facility-administered medications prior to visit.    Review of Systems All negative Except see HPI       Objective    BP 116/76 (BP Location: Left Arm, Patient Position: Sitting, Cuff Size: Normal)   Pulse 77   Temp 98 F (36.7 C) (Oral)   Wt 150 lb 11.2 oz (68.4 kg)   SpO2 99%   BMI 25.08 kg/m     Physical Exam Vitals reviewed.  Constitutional:      General: He is not in acute distress.    Appearance: Normal appearance. He is not diaphoretic.  HENT:      Head: Normocephalic and atraumatic.   Eyes:     General: No scleral icterus.    Conjunctiva/sclera: Conjunctivae normal.    Cardiovascular:     Rate and Rhythm: Normal rate and  regular rhythm.     Pulses: Normal pulses.     Heart sounds: Murmur heard.  Pulmonary:     Effort: Pulmonary effort is normal. No respiratory distress.     Breath sounds: Normal breath sounds. No wheezing or rhonchi.   Musculoskeletal:     Cervical back: Neck supple.     Right lower leg: No edema.     Left lower leg: No edema.  Lymphadenopathy:     Cervical: No cervical adenopathy.   Skin:    General: Skin is warm and dry.     Findings: No rash.   Neurological:     Mental Status: He is alert and oriented to person, place, and time. Mental status is at baseline.   Psychiatric:        Mood and Affect: Mood normal.        Behavior: Behavior normal.      No results found for any visits on 12/26/23.      Assessment & Plan Abdominal Distention Ascites Cirrhosis Worsening abdominal distention with exertional dyspnea. Slight BNP elevation, stable vitals, no acute symptoms. - Refer to gastroenterology for evaluation and potential paracentesis. - Advise alcohol abstinence. - Advise salt intake restriction. - Continue furosemide  (Lasix ) twice daily. - Instruct to seek emergency care if symptoms worsen. Consider Rx spironolactone   Alcohol Use Disorder Abstinent for two? weeks. Continued abstinence crucial to prevent complications. - Advise continued alcohol abstinence.  General Health Maintenance Emphasized salt intake monitoring due to hypernatremia. Highlighted importance of medication adherence. - Advise to monitor and restrict salt intake. - Ensure adherence to prescribed medications for hypertension and diabetes.  Ascites due to alcoholic cirrhosis (HCC) (Primary) - Ambulatory referral to Gastroenterology  Alcoholic cirrhosis of liver without ascites (HCC) - Ambulatory referral to  Gastroenterology  Heart murmur Found on PE Consider a referral to cardiology Troponin from 12/23/2023 was normal  SOB Chronic, stable  most recent chest x-ray from 12/23/2023 showed low lung volumes with bronchovascular crowding, bibasilar linear and patchy opacities, likely atelectasis Recent history of aspirational pneumonia With normal vitals including oxygen saturation Will follow-up  Diabetes type 2 Chronic and stable Blood sugar from 12/23/2018 was 142 Continue using continuous blood glucose monitoring, NovoLog  inject 20 units up to 3 times daily before meals, Tresiba  injects up to 20 units under the skin every day at bedtime. Most recent A1c was 5.7 1 month ago Continue low-carb diet and exercise as tolerated Will follow-up  No orders of the defined types were placed in this encounter.   No follow-ups on file.   The patient was advised to call back or seek an in-person evaluation if the symptoms worsen or if the condition fails to improve as anticipated.  I discussed the assessment and treatment plan with the patient. The patient was provided an opportunity to ask questions and all were answered. The patient agreed with the plan and demonstrated an understanding of the instructions.  I, Dakiyah Heinke, PA-C have reviewed all documentation for this visit. The documentation on 12/26/2023  for the exam, diagnosis, procedures, and orders are all accurate and complete.  Blane Bunting, [facility], MMS [facility] [phone] (phone) [phone] (fax)  [facility]

## 2023-12-31 ENCOUNTER — Ambulatory Visit: Payer: Self-pay

## 2023-12-31 NOTE — Telephone Encounter (Signed)
 FYI Only or Action Required?: FYI only for provider.  Patient was last seen in primary care on 12/13/2023 by James Nancyann BRAVO, MD. Called Nurse Triage reporting No chief complaint on file.. Symptoms began several days ago. Interventions attempted: Nothing. Symptoms are: gradually worsening.  Triage Disposition: Go to ED- See full note.     Patient/caregiver understands and will follow disposition?: yes        Copied from CRM [id number]. Topic: Clinical - Red Word Triage >> Dec 31, 2023 10:49 AM Emylou G wrote: [facility] that prompted transfer to Nurse Triage: fluid legs, feet, and he feels 3 months pregnant.. He said he didn't get relief from the ER Reason for Disposition  [1] MODERATE-SEVERE SWELLING of abdomen (e.g., looks very distended or swollen) AND [2] NEW-onset or much worse AND [3] vomiting  Answer Assessment - Initial Assessment Questions 1. SYMPTOM: What's the main symptom you're concerned about? (e.g., abdomen bloating, swelling)     --- Abd swelling, bilateral legs and feet.    2. ONSET: When did *No Answer*  start?     ---Several weeks ago. Was seen in the ED 6/16 and PCP 6/19. S/s have worsened since then.   3. SEVERITY: How bad is the bloating or swelling?    - BLOATING: Feels gassy or bloated. No visible swelling.     - MILD SWELLING: Feels gassy or bloated. Abdomen looks mildly distended or swollen.    - MODERATE - SEVERE SWELLING: Abdomen looks very distended or swollen.      ---- Moderate/severe-  I feel 3 months pregnant    8. OTHER SYMPTOMS: Do you have any other symptoms? (e.g., belching, blood in stool, breathing difficulty, constipation, diarrhea, fever, passing gas, vomiting, weight loss, white of eyes have turned yellow)     ---- Denies     Additional information:  Patient was seen 6//19 and was given a GI referral for a paracentesis. Patient awaiting phone call from GI dept for an appt.     This nurse gave the patient the number on  referral for him to call directly. Encouraged pt to go back to ER, as his symptoms has worsened. Patient very hesitant, but verbalized understanding.    Patient educated on pertinent s/s that would warrant emergent help/call 911. Patient verbalized understanding and agrees with plan No additional questions/concerns noted during the time of the call.  Protocols used: Abdomen Bloating and Swelling-A-AH

## 2024-01-01 ENCOUNTER — Emergency Department

## 2024-01-01 ENCOUNTER — Other Ambulatory Visit: Payer: Self-pay

## 2024-01-01 ENCOUNTER — Emergency Department
Admission: EM | Admit: 2024-01-01 | Discharge: 2024-01-01 | Disposition: A | Discharge status: Home / Self Care | Attending: Emergency Medicine | Admitting: Emergency Medicine

## 2024-01-01 DIAGNOSIS — K7031 Alcoholic cirrhosis of liver with ascites: Secondary | ICD-10-CM | POA: Diagnosis not present

## 2024-01-01 DIAGNOSIS — K7460 Unspecified cirrhosis of liver: Secondary | ICD-10-CM | POA: Diagnosis not present

## 2024-01-01 DIAGNOSIS — E871 Hypo-osmolality and hyponatremia: Secondary | ICD-10-CM | POA: Insufficient documentation

## 2024-01-01 DIAGNOSIS — R188 Other ascites: Secondary | ICD-10-CM | POA: Diagnosis not present

## 2024-01-01 DIAGNOSIS — R140 Abdominal distension (gaseous): Secondary | ICD-10-CM | POA: Diagnosis present

## 2024-01-01 LAB — CBC
HCT: 29 % — ABNORMAL LOW (ref 39.0–52.0)
Hemoglobin: 9 g/dL — ABNORMAL LOW (ref 13.0–17.0)
MCH: 26.8 pg (ref 26.0–34.0)
MCHC: 31 g/dL (ref 30.0–36.0)
MCV: 86.3 fL (ref 80.0–100.0)
Platelets: 225 10*3/uL (ref 150–400)
RBC: 3.36 MIL/uL — ABNORMAL LOW (ref 4.22–5.81)
RDW: 19.4 % — ABNORMAL HIGH (ref 11.5–15.5)
WBC: 5 10*3/uL (ref 4.0–10.5)
nRBC: 0 % (ref 0.0–0.2)

## 2024-01-01 LAB — COMPREHENSIVE METABOLIC PANEL WITH GFR
ALT: 13 U/L (ref 0–44)
AST: 30 U/L (ref 15–41)
Albumin: 2.6 g/dL — ABNORMAL LOW (ref 3.5–5.0)
Alkaline Phosphatase: 156 U/L — ABNORMAL HIGH (ref 38–126)
Anion gap: 8 (ref 5–15)
BUN: 15 mg/dL (ref 8–23)
CO2: 28 mmol/L (ref 22–32)
Calcium: 8 mg/dL — ABNORMAL LOW (ref 8.9–10.3)
Chloride: 96 mmol/L — ABNORMAL LOW (ref 98–111)
Creatinine, Ser: 0.74 mg/dL (ref 0.61–1.24)
GFR, Estimated: >60 mL/min (ref >60)
Glucose, Bld: 234 mg/dL — ABNORMAL HIGH (ref 70–99)
Potassium: 4.2 mmol/L (ref 3.5–5.1)
Sodium: 132 mmol/L — ABNORMAL LOW (ref 135–145)
Total Bilirubin: 0.8 mg/dL (ref 0.0–1.2)
Total Protein: 5.7 g/dL — ABNORMAL LOW (ref 6.5–8.1)

## 2024-01-01 MED ORDER — LIDOCAINE HCL (PF) 1 % IJ SOLN
8.0000 mL | Freq: Once | INTRAMUSCULAR | Status: AC
  Administered 2024-01-01: 8 mL via INTRADERMAL

## 2024-01-01 NOTE — Procedures (Signed)
 PROCEDURE SUMMARY:  Successful image-guided paracentesis from the right abdomen.  Yielded 9.7 liters of clear, straw-colored peritoneal fluid.  No immediate complications.  EBL: zero Patient tolerated well.   Please see imaging section of Epic for full dictation.  Carlin LABOR Toribio Seiber PA-C 01/01/2024 3:09 PM

## 2024-01-01 NOTE — ED Provider Notes (Signed)
 [facility] Provider Note   Event Date/Time   First MD Initiated Contact with Patient 01/01/24 1107     (approximate) History  Fluid Retention   HPI [name] is a 73 y.o. male with a past medical history of alcoholic cirrhosis who presents complaining of increasing ascites with abdominal distention and tightness.  Patient denies any significant abdominal pain.  Patient denies any exertional dyspnea.  Patient states that he has had to have a paracentesis in the past however it has been many many years.  Patient does not have a gastroenterologist that he follows up with regularly.  Patient does endorse mild associated nausea ROS: Patient currently denies any vision changes, tinnitus, difficulty speaking, facial droop, sore throat, chest pain, shortness of breath, nausea/vomiting/diarrhea, dysuria, or weakness/numbness/paresthesias in any extremity   Physical Exam  Triage Vital Signs: ED Triage Vitals  Encounter Vitals Group     BP 01/01/24 1051 123/78     Girls Systolic BP Percentile --      Girls Diastolic BP Percentile --      Boys Systolic BP Percentile --      Boys Diastolic BP Percentile --      Pulse Rate 01/01/24 1051 84     Resp 01/01/24 1051 20     Temp 01/01/24 1051 97.9 F (36.6 C)     Temp Source 01/01/24 1051 Oral     SpO2 01/01/24 1051 100 %     Weight 01/01/24 1052 151 lb 0.2 oz (68.5 kg)     Height 01/01/24 1052 5' 5 (1.651 m)     Head Circumference --      Peak Flow --      Pain Score 01/01/24 1052 6     Pain Loc --      Pain Education --      Exclude from Growth Chart --    Most recent vital signs: Vitals:   01/01/24 1051  BP: 123/78  Pulse: 84  Resp: 20  Temp: 97.9 F (36.6 C)  SpO2: 100%   General: Awake, oriented x4. CV:  Good peripheral perfusion. Resp:  Normal effort. Abd:  Significant tense ascites Other:  Elderly well-developed, well-nourished Caucasian male resting comfortably in no acute distress ED Results /  Procedures / Treatments  Labs (all labs ordered are listed, but only abnormal results are displayed) Labs Reviewed  CBC  COMPREHENSIVE METABOLIC PANEL WITH GFR   PROCEDURES: Critical Care performed: No Procedures MEDICATIONS ORDERED IN ED: Medications - No data to display IMPRESSION / MDM / ASSESSMENT AND PLAN / ED COURSE  I reviewed the triage vital signs and the nursing notes.                             The patient is on the cardiac monitor to evaluate for evidence of arrhythmia and/or significant heart rate changes. Patient's presentation is most consistent with acute presentation with potential threat to life or bodily function. ED Workup: CBC, BMP  Given patient's history, exam there presentation is most consistent with abdominal pain from ascites volume burden. I have a low suspicion for Spontaneous Bacterial Peritonitis or other abdominal emergency at this time. The patient had a significant amount of ascites in the abdominal cavity necessitating paracentesis.  ED Interventions: Paracentesis  Disposition: Discharge. Instructed regarding follow up within 24-48 hours with a primary care practitioner and strict return precautions.   FINAL CLINICAL IMPRESSION(S) / ED DIAGNOSES  Final diagnoses:  Ascites due to alcoholic cirrhosis (HCC)   Rx / DC Orders   ED Discharge Orders     None      Note:  This document was prepared using Dragon voice recognition software and may include unintentional dictation errors.   Benjerman Molinelli K, MD 01/01/24 [phone]

## 2024-01-01 NOTE — ED Triage Notes (Signed)
 Pt here stating he needs to have fluid taken off his ABD. Pt states HX of ascites due to liver cirrhosis. NAD noted. Pt ambulatory to triage.

## 2024-01-07 ENCOUNTER — Encounter: Payer: Self-pay | Admitting: Dermatology

## 2024-01-07 ENCOUNTER — Ambulatory Visit: Admitting: Dermatology

## 2024-01-07 DIAGNOSIS — D689 Coagulation defect, unspecified: Secondary | ICD-10-CM | POA: Diagnosis not present

## 2024-01-07 DIAGNOSIS — L820 Inflamed seborrheic keratosis: Secondary | ICD-10-CM | POA: Diagnosis not present

## 2024-01-07 DIAGNOSIS — L729 Follicular cyst of the skin and subcutaneous tissue, unspecified: Secondary | ICD-10-CM

## 2024-01-07 DIAGNOSIS — D1801 Hemangioma of skin and subcutaneous tissue: Secondary | ICD-10-CM

## 2024-01-07 DIAGNOSIS — L578 Other skin changes due to chronic exposure to nonionizing radiation: Secondary | ICD-10-CM | POA: Diagnosis not present

## 2024-01-07 DIAGNOSIS — K7031 Alcoholic cirrhosis of liver with ascites: Secondary | ICD-10-CM | POA: Diagnosis not present

## 2024-01-07 DIAGNOSIS — E871 Hypo-osmolality and hyponatremia: Secondary | ICD-10-CM | POA: Diagnosis not present

## 2024-01-07 DIAGNOSIS — Z1283 Encounter for screening for malignant neoplasm of skin: Secondary | ICD-10-CM | POA: Diagnosis not present

## 2024-01-07 DIAGNOSIS — L720 Epidermal cyst: Secondary | ICD-10-CM

## 2024-01-07 DIAGNOSIS — D500 Iron deficiency anemia secondary to blood loss (chronic): Secondary | ICD-10-CM | POA: Diagnosis not present

## 2024-01-07 DIAGNOSIS — D692 Other nonthrombocytopenic purpura: Secondary | ICD-10-CM | POA: Diagnosis not present

## 2024-01-07 DIAGNOSIS — W908XXA Exposure to other nonionizing radiation, initial encounter: Secondary | ICD-10-CM

## 2024-01-07 DIAGNOSIS — L814 Other melanin hyperpigmentation: Secondary | ICD-10-CM

## 2024-01-07 DIAGNOSIS — L821 Other seborrheic keratosis: Secondary | ICD-10-CM

## 2024-01-07 DIAGNOSIS — R140 Abdominal distension (gaseous): Secondary | ICD-10-CM | POA: Diagnosis not present

## 2024-01-07 DIAGNOSIS — K4090 Unilateral inguinal hernia, without obstruction or gangrene, not specified as recurrent: Secondary | ICD-10-CM | POA: Diagnosis not present

## 2024-01-07 DIAGNOSIS — F1010 Alcohol abuse, uncomplicated: Secondary | ICD-10-CM | POA: Diagnosis not present

## 2024-01-07 DIAGNOSIS — D229 Melanocytic nevi, unspecified: Secondary | ICD-10-CM

## 2024-01-07 DIAGNOSIS — K219 Gastro-esophageal reflux disease without esophagitis: Secondary | ICD-10-CM | POA: Diagnosis not present

## 2024-01-07 DIAGNOSIS — K860 Alcohol-induced chronic pancreatitis: Secondary | ICD-10-CM | POA: Diagnosis not present

## 2024-01-07 DIAGNOSIS — L570 Actinic keratosis: Secondary | ICD-10-CM | POA: Diagnosis not present

## 2024-01-07 DIAGNOSIS — Z9049 Acquired absence of other specified parts of digestive tract: Secondary | ICD-10-CM | POA: Diagnosis not present

## 2024-01-07 DIAGNOSIS — I864 Gastric varices: Secondary | ICD-10-CM | POA: Diagnosis not present

## 2024-01-07 DIAGNOSIS — K766 Portal hypertension: Secondary | ICD-10-CM | POA: Diagnosis not present

## 2024-01-07 NOTE — Patient Instructions (Signed)
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
## 2024-01-09 ENCOUNTER — Encounter: Payer: Self-pay | Admitting: Gastroenterology

## 2024-01-14 ENCOUNTER — Other Ambulatory Visit: Payer: Self-pay | Admitting: Gastroenterology

## 2024-01-14 DIAGNOSIS — K7031 Alcoholic cirrhosis of liver with ascites: Secondary | ICD-10-CM | POA: Diagnosis not present

## 2024-01-14 DIAGNOSIS — R140 Abdominal distension (gaseous): Secondary | ICD-10-CM | POA: Diagnosis not present

## 2024-01-14 DIAGNOSIS — K219 Gastro-esophageal reflux disease without esophagitis: Secondary | ICD-10-CM | POA: Diagnosis not present

## 2024-01-14 DIAGNOSIS — F1010 Alcohol abuse, uncomplicated: Secondary | ICD-10-CM | POA: Diagnosis not present

## 2024-01-14 DIAGNOSIS — I864 Gastric varices: Secondary | ICD-10-CM | POA: Diagnosis not present

## 2024-01-14 DIAGNOSIS — K860 Alcohol-induced chronic pancreatitis: Secondary | ICD-10-CM | POA: Diagnosis not present

## 2024-01-14 DIAGNOSIS — D500 Iron deficiency anemia secondary to blood loss (chronic): Secondary | ICD-10-CM | POA: Diagnosis not present

## 2024-01-14 DIAGNOSIS — Z9049 Acquired absence of other specified parts of digestive tract: Secondary | ICD-10-CM | POA: Diagnosis not present

## 2024-01-14 DIAGNOSIS — R748 Abnormal levels of other serum enzymes: Secondary | ICD-10-CM | POA: Diagnosis not present

## 2024-01-14 DIAGNOSIS — D689 Coagulation defect, unspecified: Secondary | ICD-10-CM | POA: Diagnosis not present

## 2024-01-14 DIAGNOSIS — E871 Hypo-osmolality and hyponatremia: Secondary | ICD-10-CM | POA: Diagnosis not present

## 2024-01-14 DIAGNOSIS — K766 Portal hypertension: Secondary | ICD-10-CM | POA: Diagnosis not present

## 2024-01-14 MED ORDER — ALBUMIN HUMAN 25 % IV SOLN: 25.0000 g | Freq: Once | INTRAVENOUS | Status: AC

## 2024-01-14 NOTE — Progress Notes (Signed)
 Clinic Follow-up Note   Patient ID: [name] is a 73 y.o. 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   Patient has seen several GI providers since 2000.  He was seen in [place] initially and later followed with [facility], [facility] and [facility] as recently as 2024.  There appear to be gaps in his care due to patient lost to follow-up.   Most recently he was seen by his PCP on June 19 with worsening ascites and abdominal distention.  Urgent consult was sent to the office.  He did undergo paracentesis in the emergency department on June 25 where 9 L were removed.  At that time he was only on Lasix  40 mg twice a day and was not monitoring any sodium intake.  He reports quitting alcohol 8 weeks ago although he reports stopping in early June to his PCP per therapy notation.  Longstanding history of drinking since he was a teenager.  He reports drinking 1/5 of vodka every 2 days.  He denies any other beer use.  Per history review was previously drinking wine prior to 15 years ago when he switched to hard liquor.  Reports the longest he has stopped for is 2 to 3 years at a time without any withdrawal symptoms.  Denies any IV drug use.  No tobacco use.   He reports some dark stools a couple months ago that lasted a few days.  This may explain the drop in his hemoglobin from previously normal baseline.  He denies any hematemesis coffee-ground emesis or hematochezia.  No abdominal pain aside from abdominal distention.  He feels that even though he underwent paracentesis less than a week ago, he feels his abdomen is swelling again but not quite as significantly.   Denies jaundice and confusion.  No issues  sleeping.  Reports he is on Coreg  but unsure if he is additionally on nadolol.   Reflux well-controlled on PPI.  No dysphagia or odynophagia   No other diarrhea or constipation.  No fever chills weight loss or appetite changes.  Denies pruritus   Denies NSAIDs, Anti-plt agents, and anticoagulants Father- colon polyps Denies other family history of gastrointestinal disease and malignancy Previous Endoscopies: February 2021-EGD-moderate portal hypertensive gastropathy; grade 1 esophageal varices without stigmata of recent bleeding; type I isolated gastric varices (IGV 1) with no stigmata of recent bleeding.  Normal duodenum   May 2017-colonoscopy internal hemorrhoids and 1 tubular adenoma resected   May 2015-EGD-irregular Z-line negative for Barrett's; positive gastritis VCE and colonoscopy negative in 2015   September 2002-colonoscopy-normal   Interval History since last visit: Pt presented to the office without appt today requesting paracentesis. He is accompanied by his daughter who was not with him last time.  Daughter feels like patient's abdomen is tense and he is requesting paracentesis. Denies pain, but feels abdomen is tense. No fever chills or confusion. No n/v. He has gained weight since his last visit- now up to 143 lbs from 139  He did not pick up his increased aldactone  dose as he said [facility] did not have it. He did not inform the clinic of this. Pt still not clear on what medication she is taking and did not bring medications or list in today.  I did  review much of our last appointment with the patient's daughter and answered her questions.  No melena or hematochezia. He reports he is urinating well. Now reporting >3 BM per day, but not using lactulose  He remains sober (over 2 months now). He is consuming high sodium foods such as country ham.  Past Medical History:  Diagnosis Date  . Adenomatous polyps   . Elevated LFTs   . Gastritis   . Pancreatitis (HHS-HCC)       Past Surgical History:  Procedure Laterality Date  . COLONOSCOPY  01/25/2005   04/07/2001; FH Colon Polyps (Father)  . COLONOSCOPY  09/23/2012   Tubulovillous Adenoma, FH Colon Polyps (Father): [facility] 09/2015; Recall Ltr mailed 07/29/2015 (dw)  . EGD  11/06/2013   09/09/2012, 05/09/2001  . COLONOSCOPY  11/21/2015   Adenomatous Polyp, FH Colon Polyps (Father): [facility] 11/2020  . CHOLECYSTECTOMY      Allergies  Allergen Reactions  . Hydrochlorothiazide Other (See Comments)    Reaction:  Hyponatremia    Family History  Problem Relation Name Age of Onset  . Colon polyps Father      Father colon polyps Patient denies other family history of GI Disease or malignancy, inflammatory bowel disease, or solid organ transplantation  Social History   Tobacco Use  . Smoking status: Never  . Smokeless tobacco: Never  Substance Use Topics  . Alcohol use: No    Alcohol/week: 0.0 standard drinks of alcohol    Comment: history of prior heavy alcohol use  . Drug use: No     Pertinent GI related history and allergies were reviewed with the patient  Review of Systems  Constitutional:  Positive for unexpected weight change. Negative for activity change, appetite change, chills, diaphoresis, fatigue and fever.  HENT:  Negative for trouble swallowing and voice change.   Respiratory:  Negative for shortness of breath and wheezing.   Cardiovascular:  Negative for chest pain and palpitations.  Gastrointestinal:  Positive for abdominal distention. Negative for abdominal pain, anal bleeding, blood in stool, constipation, diarrhea, nausea, rectal pain and vomiting.  Genitourinary:  Negative for difficulty urinating and dysuria.  Musculoskeletal:  Negative for arthralgias and myalgias.  Skin:  Negative for color change and pallor.  Neurological:  Negative for dizziness, syncope, weakness and light-headedness.  Hematological:  Bruises/bleeds easily.  Psychiatric/Behavioral:  Negative for agitation and  confusion.   All other systems reviewed and are negative.    Medications Current Outpatient Medications on File Prior to Visit  Medication Sig Dispense Refill  . acetaminophen  (TYLENOL ) 500 MG tablet Take 1,000 mg by mouth every 6 (six) hours as needed    . allopurinol  (ZYLOPRIM ) 100 MG tablet Take 1 tablet by mouth once daily.    . aspirin  81 MG chewable tablet Take 81 mg by mouth once daily. Reported on 10/14/2015     . BD ULTRA-FINE MINI PEN NEEDLE 31 gauge x 3/16 needle USE 1 TO CHECK BLOOD SUGAR 3 TIMES DAILY    . carvediloL  (COREG ) 3.125 MG tablet Take 3.125 mg by mouth 2 (two) times daily    . clotrimazole-betamethasone  (LOTRISONE) 1-0.05 % lotion Apply topically    . cyclobenzaprine  (FLEXERIL ) 10 MG tablet Take 10 mg by mouth 3 (three) times daily. Reported on 10/14/2015     . ferrous sulfate  325 (65 FE) MG tablet Take 325 mg by mouth once daily    . FUROsemide  (LASIX ) 20 MG tablet Take 2 tablets by mouth 2 (two) times daily    . insulin   ASPART (NOVOLOG ) 100 unit/mL injection Inject 10 Units subcutaneously 3 (three) times daily before meals.    . insulin  GLARGINE (LANTUS ) 100 unit/mL injection Inject 25 Units subcutaneously nightly.    . levETIRAcetam  (KEPPRA ) 750 MG tablet Take 1,500 mg by mouth 2 (two) times daily    . lisinopril  (PRINIVIL ,ZESTRIL ) 10 MG tablet Take 10 mg by mouth once daily.    . magnesium  citrate solution Take 296 mLs by mouth once.    . niacin 500 MG CR capsule Take 500 mg by mouth once daily    . omeprazole  (PRILOSEC) 40 MG DR capsule Take 1 capsule by mouth once daily    . ONETOUCH ULTRA TEST test strip     . pyridoxine (VITAMIN B-6) 25 MG tablet Take 25 mg by mouth once daily.    . sertraline  (ZOLOFT ) 100 MG tablet Take 150 mg by mouth once daily.    . spironolactone  (ALDACTONE ) 100 MG tablet Take 1 tablet (100 mg total) by mouth once daily 30 tablet 11  . sucralfate (CARAFATE) 1 gram tablet TAKE 1 TABLET BY MOUTH 4 TIMES A DAY WITH MEALS AND AT BEDTIME 360  tablet 0  . traMADoL  (ULTRAM ) 50 mg tablet Take One tab PO Q6 hours PRN pain    . TRESIBA  FLEXTOUCH U-100 pen injector (concentration 100 units/mL) INJECT UP TO 20 UNITS UNDER THE SKIN EVERY DAY AT BEDTIME. NEED APPT FOR REFILLS.    SABRA zinc citrate-phytase (ZYTAZE) 25-500 mg capsule Take by mouth.    . multivitamin tablet Take 1 tablet by mouth once daily. (Patient not taking: Reported on 01/14/2024)    . nadolol (CORGARD) 20 MG tablet TAKE 1 TABLET BY MOUTH EVERY DAY AT 5 PM (Patient not taking: Reported on 01/14/2024) 30 tablet 0  . potassium chloride  (K-DUR, KLOR-CON ) 10 mEq ER tablet Take 10 mEq by mouth once daily. (Patient not taking: Reported on 01/14/2024)     No current facility-administered medications on file prior to visit.    Pertinent GI related medications were reviewed with the patient  Objective   Vitals:   01/14/24 1111  BP: 122/78  Pulse: 81  Temp: 36.2 C (97.1 F)  TempSrc: Oral  Weight: 65.2 kg (143 lb 12.8 oz)  Height: 165.1 cm (5' 5)    Wt Readings from Last 3 Encounters:  01/14/24 65.2 kg (143 lb 12.8 oz)  01/07/24 63.4 kg (139 lb 12.8 oz)  10/14/15 78.2 kg (172 lb 6.4 oz)    Body mass index is 23.93 kg/m.  Physical Exam Vitals and nursing note reviewed.  Constitutional:      General: He is not in acute distress.    Appearance: He is well-developed. He is ill-appearing (chronically). He is not toxic-appearing or diaphoretic.  HENT:     Head: Normocephalic and atraumatic.     Nose: Nose normal.     Mouth/Throat:     Mouth: Mucous membranes are moist.     Pharynx: Oropharynx is clear.   Eyes:     General: No scleral icterus.    Extraocular Movements: Extraocular movements intact.    Cardiovascular:     Rate and Rhythm: Normal rate and regular rhythm.     Heart sounds: Murmur heard.     No friction rub. No gallop.  Pulmonary:     Effort: Pulmonary effort is normal. No respiratory distress.     Breath sounds: Normal breath sounds. No wheezing,  rhonchi or rales.  Abdominal:     General: Bowel sounds are  normal. There is distension.     Palpations: Abdomen is soft.     Tenderness: There is no abdominal tenderness. There is no guarding or rebound.     Comments: no rigidity, non peritoneal  Significant ascites present, however, abdomen is not much different from last week upon palpation  Genitourinary:    Comments: Rectal exam deferred  Musculoskeletal:     Cervical back: Neck supple.     Right lower leg: No edema.     Left lower leg: No edema.   Skin:    General: Skin is warm and dry.     Coloration: Skin is not jaundiced or pale.   Neurological:     General: No focal deficit present.     Mental Status: He is alert and oriented to person, place, and time. Mental status is at baseline.   Psychiatric:        Mood and Affect: Mood normal.        Behavior: Behavior normal.        Thought Content: Thought content normal.        Judgment: Judgment normal.     Laboratory Data July labs Reviewed by me with patient and his daughter Negative for celiac disease Immune to Hepatitis A No Hepatitis B or C infection Iron  panel revealing deficiency- already scheduled for upper endoscopy and colonoscopy to further evaluate Alkaline phosphatase is elevated. INR stable MELD is 7; Child Pugh B Hemoglobin improving from recent labs Folate and vitamin b12 normal Negative for autoimmune hepatitis and PBC Negative for a1at disease Negative for Wilsons disease  Imaging: No recent pertinent imaging  Assessment:   1. 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 Abdominal distension - ascites 2/2 above - 9L removed on 01/01/24   3. Portal hypertension with esophageal varices (CMS/HHS-HCC) - on egd in 2021   4. Gastric  varix - on egd in 2021   5. Anemia due to chronic blood loss - recent drop in hgb suspect 2/2 slow gib - improved on recent check   6. Alcohol-induced chronic pancreatitis (CMS/HHS-HCC) -Calcifications and atrophy noted on April 2025 CT   7. Portal hypertensive gastropathy  (CMS/HHS-HCC) -- on egd in 2021   8. GERD without esophagitis - on egd in 2021 - controlled on ppi   9. Coagulopathy (CMS/HHS-HCC) -Secondary to cirrhosis   10. Hyponatremia -Secondary to cirrhosis   11. S/P cholecystectomy   12.  Alcohol abuse  13. Elevated Alk Phos   14. Internal hemorrhoid   15. Personal history of adenomatous and serrated colon polyps   16. Family history of polyps in the colon   17. Heart murmur   18. Type 2 diabetes mellitus with other specified complication, with long-term current use of insulin  (CMS/HHS-HCC) - A1c in the past elevated.  This is improving on insulin   Plan:   Patient presented to clinic with daughter today requesting paracentesis I reviewed his labs with him and his daughter Counseled on cirrhosis - etiology, course of disease, expectations, etc. Daughter provided with medication list to review and cross compare with what her father has at home so we can accurately and appropriately treat as he is still unclear on his medications Provided lactulose in case confusion occurs- pt now reports he has having 3+ Bm  per day- no longer with melena or hematochezia.  Have ordered CT liver given elevated alk phos Pt has not started increased spironolactone  dose- he reports [facility] not [facility] even though it was sent at the same time as his csy bowel prep and he was able to get this. Daughter will verify and call us  back if needed  Will request paracentesis with studies and administration of albumin   Continue plan for egd and colonoscopy at [facility]  Counseled on sodium and protein intake Counseled on nsaid avoidance and tylenol  limits  Esophagogastroduodenoscopy  and colonoscopy with possible biopsy, control of bleeding, polypectomy, and interventions as necessary has been discussed with the patient/patient representative. Informed consent was obtained from the patient/patient representative after explaining the indication, nature, and risks of the procedure including but not limited to death, bleeding, perforation, missed neoplasm/lesions, cardiorespiratory compromise, and reaction to medications. Opportunity for questions was given and appropriate answers were provided. Patient/patient representative has verbalized understanding is amenable to undergoing the procedure.  37 minutes were spent in this encounter including pre-visit review of records (including pertinent notes, labs, imaging), in office visit, and post visit documentation Thank you for allowing us  to participate in this patient's care. Please do not hesitate to call if any questions or concerns arise.    Attestation Statement:   I personally performed the service. (TP)  STEVEN MICHAEL RUSSO, DO   [facility]   Portions of the record may have been created with voice recognition software. Occasional wrong-word or 'sound-a-like' substitutions may have occurred due to the inherent limitations of voice recognition software.  Read the chart carefully and recognize, using context, where substitutions may have occurred.

## 2024-01-15 ENCOUNTER — Other Ambulatory Visit: Payer: Self-pay | Admitting: Gastroenterology

## 2024-01-15 ENCOUNTER — Ambulatory Visit
Admission: RE | Admit: 2024-01-15 | Discharge: 2024-01-15 | Disposition: A | Discharge status: Home / Self Care | Source: Ambulatory Visit | Attending: Gastroenterology | Admitting: Gastroenterology

## 2024-01-15 DIAGNOSIS — R188 Other ascites: Secondary | ICD-10-CM | POA: Diagnosis not present

## 2024-01-15 DIAGNOSIS — K7031 Alcoholic cirrhosis of liver with ascites: Secondary | ICD-10-CM | POA: Insufficient documentation

## 2024-01-15 LAB — ALBUMIN, PLEURAL OR PERITONEAL FLUID: Albumin, Fluid: <1.5 g/dL

## 2024-01-15 LAB — BODY FLUID CELL COUNT WITH DIFFERENTIAL
Eos, Fluid: 0 %
Lymphs, Fluid: 21 %
Monocyte-Macrophage-Serous Fluid: 73 %
Neutrophil Count, Fluid: 6 %
Total Nucleated Cell Count, Fluid: 105 uL

## 2024-01-15 LAB — PROTEIN, PLEURAL OR PERITONEAL FLUID: Total protein, fluid: <3 g/dL

## 2024-01-15 MED ORDER — LIDOCAINE HCL (PF) 1 % IJ SOLN
10.0000 mL | Freq: Once | INTRAMUSCULAR | Status: AC
  Administered 2024-01-15: 10 mL via INTRADERMAL
  Filled 2024-01-15: qty 10

## 2024-01-15 MED ORDER — ALBUMIN HUMAN 25 % IV SOLN
25.0000 g | Freq: Once | INTRAVENOUS | Status: AC
  Administered 2024-01-15: 25 g via INTRAVENOUS

## 2024-01-15 MED ORDER — ALBUMIN HUMAN 25 % IV SOLN
INTRAVENOUS | Status: AC
  Filled 2024-01-15: qty 100

## 2024-01-15 NOTE — Procedures (Signed)
 PROCEDURE SUMMARY:  Successful ultrasound guided paracentesis from the right lower quadrant.  Yielded 6.6 L of clear yellow fluid.  No immediate complications.  The patient tolerated the procedure well.   Specimen sent for labs.  EBL < 2 mL  The patient has required >/=2 paracenteses in a 30 day period and a screening evaluation by the [facility] has been arranged.  Warren Dais, AGACNP-BC 01/15/2024, 10:03 AM

## 2024-01-16 ENCOUNTER — Other Ambulatory Visit: Payer: Self-pay | Admitting: Gastroenterology

## 2024-01-16 DIAGNOSIS — K7031 Alcoholic cirrhosis of liver with ascites: Secondary | ICD-10-CM

## 2024-01-16 DIAGNOSIS — R748 Abnormal levels of other serum enzymes: Secondary | ICD-10-CM

## 2024-01-16 LAB — ACID FAST SMEAR (AFB, MYCOBACTERIA): Acid Fast Smear: NEGATIVE

## 2024-01-16 LAB — CYTOLOGY - NON PAP

## 2024-01-18 LAB — BODY FLUID CULTURE W GRAM STAIN
Culture: NO GROWTH
Gram Stain: NONE SEEN

## 2024-01-21 ENCOUNTER — Ambulatory Visit: Attending: Cardiology | Admitting: Cardiology

## 2024-01-21 ENCOUNTER — Encounter: Payer: Self-pay | Admitting: Cardiology

## 2024-01-21 ENCOUNTER — Other Ambulatory Visit: Payer: Self-pay | Admitting: Cardiology

## 2024-01-21 ENCOUNTER — Telehealth (HOSPITAL_COMMUNITY): Payer: Self-pay | Admitting: *Deleted

## 2024-01-21 VITALS — BP 92/60 | HR 68 | Ht 65.0 in | Wt 130.4 lb

## 2024-01-21 DIAGNOSIS — F1021 Alcohol dependence, in remission: Secondary | ICD-10-CM | POA: Diagnosis not present

## 2024-01-21 DIAGNOSIS — K7031 Alcoholic cirrhosis of liver with ascites: Secondary | ICD-10-CM | POA: Diagnosis not present

## 2024-01-21 DIAGNOSIS — Z01810 Encounter for preprocedural cardiovascular examination: Secondary | ICD-10-CM | POA: Diagnosis not present

## 2024-01-21 DIAGNOSIS — R0609 Other forms of dyspnea: Secondary | ICD-10-CM

## 2024-01-21 DIAGNOSIS — I350 Nonrheumatic aortic (valve) stenosis: Secondary | ICD-10-CM | POA: Insufficient documentation

## 2024-01-21 DIAGNOSIS — I8510 Secondary esophageal varices without bleeding: Secondary | ICD-10-CM | POA: Diagnosis not present

## 2024-01-21 NOTE — Patient Instructions (Signed)
Medication Instructions:  Your physician recommends that you continue on your current medications as directed. Please refer to the Current Medication list given to you today.  *If you need a refill on your cardiac medications before your next appointment, please call your pharmacy*   Lab Work: None ordered If you have labs (blood work) drawn today and your tests are completely normal, you will receive your results only by: MyChart Message (if you have MyChart) OR A paper copy in the mail If you have any lab test that is abnormal or we need to change your treatment, we will call you to review the results.   Testing/Procedures: You are scheduled for a Myocardial Perfusion Imaging Study.  Please arrive 15 minutes prior to your appointment time for registration and insurance purposes.  The test will take approximately 3 to 4 hours to complete; you may bring reading material.  If someone comes with you to your appointment, they will need to remain in the main lobby due to limited space in the testing area.   How to prepare for your Myocardial Perfusion Test: Do not eat or drink 3 hours prior to your test, except you may have water. Do not consume products containing caffeine (regular or decaffeinated) 12 hours prior to your test. (ex: coffee, chocolate, sodas, tea). Do bring a list of your current medications with you.  If not listed below, you may take your medications as normal. Do wear comfortable clothes (no dresses or overalls) and walking shoes, tennis shoes preferred (No heels or open toe shoes are allowed). Do NOT wear cologne, perfume, aftershave, or lotions (deodorant is allowed). If these instructions are not followed, your test will have to be rescheduled.  If you cannot keep your appointment, please provide 24 hours notification to the Nuclear Lab, to avoid a possible $50 charge to your account.  Follow-Up: At [facility], you and your health needs are our priority.   As part of our continuing mission to provide you with exceptional heart care, we have created designated Provider Care Teams.  These Care Teams include your primary Cardiologist (physician) and Advanced Practice Providers (APPs -  Physician Assistants and Nurse Practitioners) who all work together to provide you with the care you need, when you need it.  We recommend signing up for the patient portal called "MyChart".  Sign up information is provided on this After Visit Summary.  MyChart is used to connect with patients for Virtual Visits (Telemedicine).  Patients are able to view lab/test results, encounter notes, upcoming appointments, etc.  Non-urgent messages can be sent to your provider as well.   To learn more about what you can do with MyChart, go to [email or web address].    Your next appointment:   As needed   Provider:   Belva Crome, MD   Other Instructions  Cardiac Nuclear Scan A cardiac nuclear scan is a test that is done to check the flow of blood to your heart. It is done when you are resting and when you are exercising. The test looks for problems such as: Not enough blood reaching a portion of the heart. The heart muscle not working as it should. You may need this test if you have: Heart disease. Lab results that are not normal. Had heart surgery or a balloon procedure to open up blocked arteries (angioplasty) or a small mesh tube (stent). Chest pain. Shortness of breath. Had a heart attack. In this test, a special dye (tracer) is put into your bloodstream.  The tracer will travel to your heart. A camera will then take pictures of your heart to see how the tracer moves through your heart. This test is usually done at a hospital and takes 2-4 hours. Tell a doctor about: Any allergies you have. All medicines you are taking, including vitamins, herbs, eye drops, creams, and over-the-counter medicines. Any bleeding problems you have. Any surgeries you have had. Any  medical conditions you have. Whether you are pregnant or may be pregnant. Any history of asthma or long-term (chronic) lung disease. Any history of heart rhythm disorders or heart valve conditions. What are the risks? Your doctor will talk with you about risks. These may include: Serious chest pain and heart attack. This is only a risk if the stress portion of the test is done. Fast or uneven heartbeats (palpitations). A feeling of warmth in your chest. This feeling usually does not last long. Allergic reaction to the tracer. Shortness of breath or trouble breathing. What happens before the test? Ask your doctor about changing or stopping your normal medicines. Follow instructions from your doctor about what you cannot eat or drink. Remove your jewelry on the day of the test. Ask your doctor if you need to avoid nicotine or caffeine. What happens during the test? An IV tube will be inserted into one of your veins. Your doctor will give you a small amount of tracer through the IV tube. You will wait for 20-40 minutes while the tracer moves through your bloodstream. Your heart will be monitored with an electrocardiogram (ECG). You will lie down on an exam table. Pictures of your heart will be taken for about 15-20 minutes. You may also have a stress test. For this test, one of these things may be done: You will be asked to exercise on a treadmill or a stationary bike. You will be given medicines that will make your heart work harder. This is done if you are unable to exercise. When blood flow to your heart has peaked, a tracer will again be given through the IV tube. After 20-40 minutes, you will get back on the exam table. More pictures will be taken of your heart. Depending on the tracer that is used, more pictures may need to be taken 3-4 hours later. Your IV tube will be removed when the test is over. The test may vary among doctors and hospitals. What happens after the test? Ask  your doctor: Whether you can return to your normal schedule, including diet, activities, travel, and medicines. Whether you should drink more fluids. This will help to remove the tracer from your body. Ask your doctor, or the department that is doing the test: When will my results be ready? How will I get my results? What are my treatment options? What other tests do I need? What are my next steps? This information is not intended to replace advice given to you by your health care provider. Make sure you discuss any questions you have with your health care provider. Document Revised: 11/21/2021 Document Reviewed: 11/21/2021 Elsevier Patient Education  2023 [employer].

## 2024-01-21 NOTE — Progress Notes (Signed)
 Cardiology Office Note:    Date:  01/21/2024   ID:  [name], DOB 1950-07-23, MRN [id number]  PCP:  Gasper Nancyann BRAVO, MD  Cardiologist:  Jennifer JONELLE Crape, MD   Referring MD: Gasper Nancyann BRAVO, MD    ASSESSMENT:    1. DOE (dyspnea on exertion)   2. Preop cardiovascular exam   3. Secondary esophageal varices without bleeding (HCC)   4. Alcoholic cirrhosis of liver with ascites (HCC)   5. Alcohol dependence in remission (HCC)   6. Mild aortic stenosis    PLAN:    In order of problems listed above:  Primary prevention stressed with the patient.  Importance of compliance with diet medication stressed and patient verbalized standing. Mild aortic stenosis: Stable and unremarkable.  Will continue to monitor. Preop cardiovascular assessment: He has multiple risk factors for coronary artery disease so we will do a Lexiscan  sestamibi.  If this is negative then he is not at high risk for coronary events during the aforementioned surgery.  Medical hemodynamic monitoring will further reduce the risk of coronary events. History of alcohol abuse and alcoholic cirrhosis: He was advised not to go back to alcohol. He will be seen in follow-up appointment on a as needed basis.   Medication Adjustments/Labs and Tests Ordered: Current medicines are reviewed at length with the patient today.  Concerns regarding medicines are outlined above.  Orders Placed This Encounter  Procedures   Cardiac Stress Test: Informed Consent Details: Physician/Practitioner Attestation; Transcribe to consent form and obtain patient signature   MYOCARDIAL PERFUSION IMAGING   No orders of the defined types were placed in this encounter.    History of Present Illness:    [name] is a 73 y.o. male who is being seen today for the evaluation of preop cardiovascular evaluation and dyspnea on exertion at the request of Fisher, Nancyann BRAVO, MD. patient is a pleasant 73 year old male.  He has past medical history of mild  aortic stenosis, alcoholic cirrhosis.  Overall he leads a sedentary lifestyle.  He mentions to me that he is not had alcohol for the past 3 to 4 months.  He is planning to undergo upper and lower endoscopy and he is sent here for preop assessment.  He denies any chest pain orthopnea or PND.  At this time he does not smoke.  At the time of my evaluation, the patient is alert awake oriented and in no distress.  Past Medical History:  Diagnosis Date   Alcohol abuse    Alcoholism (HCC)    Anxiety    Aortic atherosclerosis (HCC)    Arthritis    Ascites    Aspiration pneumonia (HCC) 10/28/2023   Cancer (HCC)    Chronic pancreatitis (HCC)    Cirrhosis with alcoholism (HCC)    Colon polyp    COVID-19    DDD (degenerative disc disease), cervical    Depression    DM (diabetes mellitus) (HCC) 10/31/2023   DM (diabetes mellitus), type 2 (HCC)    Dyspnea    ED (erectile dysfunction)    Elevated liver enzymes    Esophageal varices (HCC)    Esophagitis    Gastric catarrh    Gastric varices    Gastritis    GERD (gastroesophageal reflux disease)    Gout    H. pylori infection    H/O gynecomastia    Hearing loss in right ear    Hernia, inguinal, left 03/20/2015   History of kidney stones  History of portal hypertension    HTN (hypertension) 10/31/2023   Hyperkalemia    Hyperlipidemia    Hypertension    Hyponatremia    IDA (iron  deficiency anemia)    Lipodystrophy    Obesity    Odynophagia    Pancreatitis    Refusal of blood transfusions as patient is Jehovah's Witness    Right inguinal hernia    Seizure (HCC)    as a child-age 61-none since-happened from fall as a child   Seizures (HCC)    Seizures (HCC) 12/01/2023   Severe sepsis (HCC) 10/28/2023   Splenic vein thrombosis    Splenomegaly    Ventral hernia    Vitamin D  deficiency     Past Surgical History:  Procedure Laterality Date   CHOLECYSTECTOMY  2006   COLONOSCOPY WITH ESOPHAGOGASTRODUODENOSCOPY (EGD)      COLONOSCOPY WITH PROPOFOL  N/A 11/21/2015   Procedure: COLONOSCOPY WITH PROPOFOL ;  Surgeon: Lamar ONEIDA Holmes, MD;  Location: [facility] ENDOSCOPY;  Service: Endoscopy;  Laterality: N/A;   ESOPHAGOGASTRODUODENOSCOPY (EGD) WITH PROPOFOL  N/A 08/21/2019   Procedure: ESOPHAGOGASTRODUODENOSCOPY (EGD) WITH PROPOFOL ;  Surgeon: Jinny Carmine, MD;  Location: [facility] ENDOSCOPY;  Service: Endoscopy;  Laterality: N/A;   HERNIA REPAIR     Inguinal Hernia Repai. Left: 07/28/2013 Dr. Eluterio; 2nd repair done 04/30/2014   INSERTION OF MESH N/A 04/10/2023   Procedure: INSERTION OF MESH;  Surgeon: Lane Shope, MD;  Location: [facility] ORS;  Service: General;  Laterality: N/A;   TONSILLECTOMY     XI ROBOTIC ASSISTED VENTRAL HERNIA N/A 04/10/2023   Procedure: XI ROBOTIC ASSISTED VENTRAL HERNIA;  Surgeon: Lane Shope, MD;  Location: [facility] ORS;  Service: General;  Laterality: N/A;    Current Medications: Current Meds  Medication Sig   Accu-Chek FastClix Lancets MISC USE TO CHECK GLUCOSE BEFORE MEALS AND AS NEEDED   acetaminophen  (TYLENOL ) 500 MG tablet Take 1,000 mg by mouth every 6 (six) hours as needed.   allopurinol  (ZYLOPRIM ) 100 MG tablet TAKE 1 TABLET BY MOUTH EVERY DAY   aspirin  81 MG chewable tablet Chew 1 tablet (81 mg total) by mouth daily.   BD PEN NEEDLE MINI ULTRAFINE 31G X 5 MM MISC USE 1 TO CHECK BLOOD SUGAR 3 TIMES DAILY   carvedilol  (COREG ) 3.125 MG tablet TAKE 1 TABLET BY MOUTH 2 TIMES DAILY.   clobetasol (TEMOVATE) 0.05 % external solution Apply 1 Application topically 2 (two) times daily as needed.   Continuous Blood Gluc Receiver (FREESTYLE LIBRE 14 DAY READER) DEVI 1 Device by Does not apply route every 14 (fourteen) days. for type insulin  dependent type 2 diabetes   Continuous Glucose Sensor (FREESTYLE LIBRE 3 PLUS SENSOR) MISC Change sensor every 15 days.   eplerenone  (INSPRA ) 25 MG tablet TAKE 1 TABLET BY MOUTH TWICE A DAY   folic acid  (FOLVITE ) 1 MG tablet Take 1 tablet (1 mg total) by mouth daily.    furosemide  (LASIX ) 20 MG tablet TAKE 2 TABLETS BY MOUTH 2 TIMES DAILY.   insulin  aspart (NOVOLOG  FLEXPEN) 100 UNIT/ML FlexPen 0-9 Units, Subcutaneous, 3 times daily with meals CBG < 70: Implement Hypoglycemia measures CBG 70 - 120: 0 units CBG 121 - 150: 1 unit CBG 151 - 200: 2 units CBG 201 - 250: 3 units CBG 251 - 300: 5 units CBG 301 - 350: 7 units CBG 351 - 400: 9 units CBG > 400: call MD   insulin  degludec (TRESIBA  FLEXTOUCH) 100 UNIT/ML FlexTouch Pen INJECT UP TO 20 UNITS UNDER THE SKIN EVERY DAY AT  BEDTIME. NEED APPT FOR REFILLS.   Iron , Ferrous Sulfate , 325 (65 Fe) MG TABS Take 325 mg by mouth daily.   levETIRAcetam  (KEPPRA ) 750 MG tablet Take 2 tablets (1,500 mg total) by mouth 2 (two) times daily.   lidocaine  (LIDODERM ) 5 % Place 1 patch onto the skin daily. Remove & Discard patch within 12 hours or as directed by MD   lisinopril  (ZESTRIL ) 5 MG tablet Take 5 mg by mouth daily.   magnesium  gluconate (MAGONATE) 500 MG tablet Take 500 mg by mouth daily.    Multiple Vitamin (MULTIVITAMIN WITH MINERALS) TABS tablet Take 1 tablet by mouth daily.   niacin 500 MG tablet Take 500 mg by mouth at bedtime.   NOVOLOG  FLEXPEN 100 UNIT/ML FlexPen INJECT 20 UNITS UP TO THREE TIMES A DAY BEFORE MEALS. (Patient taking differently: 5-20 Units 3 (three) times daily with meals. Inject 20 units up to three times a day before meals. SS)   omeprazole  (PRILOSEC) 40 MG capsule TAKE 1 CAPSULE BY MOUTH EVERY DAY   ONETOUCH VERIO test strip USE TO CHECK SUGAR THREE TIMES DAILY FOR INSULIN  DEPENDENT TYPE 2 DIABETES   promethazine  (PHENERGAN ) 25 MG tablet Take 1 tablet (25 mg total) by mouth every 8 (eight) hours as needed.   pyridOXINE (VITAMIN B-6) 50 MG tablet Take 50 mg by mouth daily.   sertraline  (ZOLOFT ) 100 MG tablet TAKE 1 TABLET BY MOUTH EVERY DAY   sodium zirconium cyclosilicate  (LOKELMA ) 5 g packet Take 5 g by mouth daily.   spironolactone  (ALDACTONE ) 100 MG tablet Take 100 mg by mouth daily.   thiamine   (VITAMIN B-1) 100 MG tablet Take 1 tablet (100 mg total) by mouth daily.   traMADol  (ULTRAM ) 50 MG tablet TAKE 1 TABLET BY MOUTH EVERY 8 HOURS AS NEEDED.   zinc gluconate 50 MG tablet Take 50 mg by mouth daily.   zinc sulfate, 50mg  elemental zinc, 220 (50 Zn) MG capsule Take 220 mg by mouth daily.   Current Facility-Administered Medications for the 01/21/24 encounter (Office Visit) with Kathalene Sporer, Jennifer SAUNDERS, MD  Medication   albumin  human 25 % solution 25 g     Allergies:   Hydrochlorothiazide and Hydrochlorothiazide   Social History   Socioeconomic History   Marital status: Married    Spouse name: Candy   Number of children: 3   Years of education: Not on file   Highest education level: Some college, no degree  Occupational History   Occupation: retired  Tobacco Use   Smoking status: Former    Types: Cigarettes   Smokeless tobacco: Never  Vaping Use   Vaping status: Never Used  Substance and Sexual Activity   Alcohol use: Yes    Comment: drank few weeks ago   Drug use: Never   Sexual activity: Yes  Other Topics Concern   Not on file  Social History Narrative   ** Merged History Encounter **       Social Drivers of Health   Financial Resource Strain: Low Risk  (01/07/2024)   Received from [facility]   Overall Financial Resource Strain (CARDIA)    Difficulty of Paying Living Expenses: Not hard at all  Food Insecurity: No Food Insecurity (01/07/2024)   Received from [facility]   Hunger Vital Sign    Within the past 12 months, you worried that your food would run out before you got the money to buy more.: Never true    Within the past 12 months, the  food you bought just didn't last and you didn't have money to get more.: Never true  Transportation Needs: No Transportation Needs (01/07/2024)   Received from [facility] - Transportation    In the past 12 months, has lack of transportation kept you from medical  appointments or from getting medications?: No    Lack of Transportation (Non-Medical): No  Physical Activity: Insufficiently Active (07/30/2023)   Exercise Vital Sign    Days of Exercise per Week: 3 days    Minutes of Exercise per Session: 10 min  Stress: No Stress Concern Present (07/30/2023)   [employer] of Occupational Health - Occupational Stress Questionnaire    Feeling of Stress : Only a little  Social Connections: Moderately Integrated (10/31/2023)   Social Connection and Isolation Panel    Frequency of Communication with Friends and Family: More than three times a week    Frequency of Social Gatherings with Friends and Family: Three times a week    Attends Religious Services: More than 4 times per year    Active Member of Clubs or Organizations: No    Attends Banker Meetings: Never    Marital Status: Married     Family History: The patient's family history includes Colon polyps in his father; Congestive Heart Failure in his brother; Dementia in his mother; Diabetes in his brother; Heart Problems in his brother; Hypertension in his brother, brother, and father; Lung cancer in his father.  ROS:   Please see the history of present illness.    All other systems reviewed and are negative.  EKGs/Labs/Other Studies Reviewed:    The following studies were reviewed today:  I reviewed EKG from the records.       Recent Labs: 11/02/2023: TSH 4.762 11/04/2023: Magnesium  1.7 12/23/2023: B Natriuretic Peptide 126.3 01/01/2024: ALT 13; BUN 15; Creatinine, Ser 0.74; Hemoglobin 9.0; Platelets 225; Potassium 4.2; Sodium 132  Recent Lipid Panel    Component Value Date/Time   CHOL 39 11/02/2023 0333   CHOL 88 (L) 03/13/2023 1537   CHOL 92 09/07/2012 0406   TRIG 37 11/02/2023 0333   TRIG 116 09/07/2012 0406   HDL 17 (L) 11/02/2023 0333   HDL 43 03/13/2023 1537   HDL 21 (L) 09/07/2012 0406   CHOLHDL 2.3 11/02/2023 0333   VLDL 7 11/02/2023 0333   VLDL 23 09/07/2012  0406   LDLCALC 15 11/02/2023 0333   LDLCALC 32 03/13/2023 1537   LDLCALC 67 06/10/2017 1110   LDLCALC 48 09/07/2012 0406    Physical Exam:    VS:  BP 92/60   Pulse 68   Ht 5' 5 (1.651 m)   Wt 130 lb 6.4 oz (59.1 kg)   SpO2 99%   BMI 21.70 kg/m     Wt Readings from Last 3 Encounters:  01/21/24 130 lb 6.4 oz (59.1 kg)  01/01/24 151 lb 0.2 oz (68.5 kg)  12/26/23 150 lb 11.2 oz (68.4 kg)     GEN: Patient is in no acute distress HEENT: Normal NECK: No JVD; No carotid bruits LYMPHATICS: No lymphadenopathy CARDIAC: S1 S2 regular, 2/6 systolic murmur at the apex. RESPIRATORY:  Clear to auscultation without rales, wheezing or rhonchi  ABDOMEN: Soft, non-tender, non-distended MUSCULOSKELETAL:  No edema; No deformity  SKIN: Warm and dry NEUROLOGIC:  Alert and oriented x 3 PSYCHIATRIC:  Normal affect    Signed, Jennifer JONELLE Crape, MD  01/21/2024 11:21 AM    [facility]

## 2024-01-21 NOTE — Telephone Encounter (Signed)
 Left message on voicemail per DPR in reference to upcoming appointment scheduled on  01/23/24 with detailed instructions given per Myocardial Perfusion Study Information Sheet for the test. LM to arrive 15 minutes early, and that it is imperative to arrive on time for appointment to keep from having the test rescheduled. If you need to cancel or reschedule your appointment, please call the office within 24 hours of your appointment. Failure to do so may result in a cancellation of your appointment, and a $50 no show fee. Phone number given for call back for any questions.   Claudene Ronal Quale, RN

## 2024-01-23 ENCOUNTER — Ambulatory Visit: Payer: Self-pay | Admitting: Cardiology

## 2024-01-23 ENCOUNTER — Ambulatory Visit (HOSPITAL_COMMUNITY)
Admission: RE | Admit: 2024-01-23 | Discharge: 2024-01-23 | Disposition: A | Discharge status: Home / Self Care | Source: Ambulatory Visit | Attending: Cardiology | Admitting: Cardiology

## 2024-01-23 DIAGNOSIS — Z01810 Encounter for preprocedural cardiovascular examination: Secondary | ICD-10-CM | POA: Insufficient documentation

## 2024-01-23 DIAGNOSIS — R0609 Other forms of dyspnea: Secondary | ICD-10-CM | POA: Insufficient documentation

## 2024-01-23 LAB — MYOCARDIAL PERFUSION IMAGING
LV dias vol: 55 mL (ref 62–150)
LV sys vol: 11 mL (ref 4.2–5.8)
Nuc Stress EF: 80 %
Peak HR: 73 {beats}/min
Rest HR: 58 {beats}/min
Rest Nuclear Isotope Dose: 10.2 mCi
SDS: 0
SRS: 15
SSS: 8
ST Depression (mm): 0 mm
Stress Nuclear Isotope Dose: 31.5 mCi
TID: 0.89

## 2024-01-23 MED ORDER — TECHNETIUM TC 99M TETROFOSMIN IV KIT
31.5000 | PACK | Freq: Once | INTRAVENOUS | Status: AC | PRN
  Administered 2024-01-23: 31.5 via INTRAVENOUS

## 2024-01-23 MED ORDER — TECHNETIUM TC 99M TETROFOSMIN IV KIT
10.2000 | PACK | Freq: Once | INTRAVENOUS | Status: AC | PRN
Start: 2024-01-23 — End: 2024-01-23
  Administered 2024-01-23: 10.2 via INTRAVENOUS

## 2024-01-23 MED ORDER — REGADENOSON 0.4 MG/5ML IV SOLN
0.4000 mg | Freq: Once | INTRAVENOUS | Status: AC
  Administered 2024-01-23: 0.4 mg via INTRAVENOUS

## 2024-01-23 MED ORDER — REGADENOSON 0.4 MG/5ML IV SOLN
INTRAVENOUS | Status: AC
  Filled 2024-01-23: qty 5

## 2024-01-26 ENCOUNTER — Other Ambulatory Visit: Payer: Self-pay | Admitting: Physician Assistant

## 2024-01-26 DIAGNOSIS — I672 Cerebral atherosclerosis: Secondary | ICD-10-CM

## 2024-01-27 ENCOUNTER — Encounter: Payer: Self-pay | Admitting: Neurology

## 2024-01-27 ENCOUNTER — Ambulatory Visit: Admitting: Neurology

## 2024-01-27 VITALS — BP 124/76 | HR 60 | Ht 65.0 in | Wt 133.6 lb

## 2024-01-27 DIAGNOSIS — R569 Unspecified convulsions: Secondary | ICD-10-CM | POA: Diagnosis not present

## 2024-01-27 DIAGNOSIS — I63532 Cerebral infarction due to unspecified occlusion or stenosis of left posterior cerebral artery: Secondary | ICD-10-CM

## 2024-01-27 DIAGNOSIS — R269 Unspecified abnormalities of gait and mobility: Secondary | ICD-10-CM | POA: Diagnosis not present

## 2024-01-27 MED ORDER — LEVETIRACETAM 750 MG PO TABS
1500.0000 mg | ORAL_TABLET | Freq: Two times a day (BID) | ORAL | 11 refills | Status: AC
Start: 2024-01-27 — End: ?

## 2024-01-27 NOTE — Progress Notes (Signed)
 [facility]  PATIENT: [name] DOB: [birth year]  REQUESTING CLINICIAN: Matthews Elida HERO, MD HISTORY FROM: Patient/Daughter  REASON FOR VISIT: Seizures/Stroke    HISTORICAL  CHIEF COMPLAINT:  Chief Complaint  Patient presents with   New Patient (Initial Visit)    Pt in room 12. Daughter in room. NP internal referral for CVA and Seizure. Patient reports doing well, taking keppra . No recent seizures.     HISTORY OF PRESENT ILLNESS:  This is 73 year old gentleman past medical history of alcohol abuse, in remission since April of this year, chronic pain syndrome, hypertension, hyperlipidemia, diabetes, heart disease who is presenting after being admitted to the hospital for altered mental status, pneumonia, seizures and new stroke on MRI.  Per chart review, patient was found unresponsive in the bathtub, with no water, unable to get out.  EMS was called.  In route to the hospital he did have a seizure.  In the hospital diagnosed with multifocal pneumonia, metabolic encephalopathy and new subacute stroke.  His EEG captured subclinical seizures, patient was started on Keppra .    Today he tells me that he had a history of seizures since the age of 19 after having a head trauma.  He was put on phenobarbital until the age of 25 and went on to not have any additional seizures and being off medication.   For his stroke, he is on aspirin , his LDL was15, therefore statin not started.  Since leaving the hospital, he tells me that he has been doing well.  He has a history of liver disease and recurrent ascites, while in the hospital he report 2 gallons of fluid removed, he has not had any additional paracentesis since leaving the hospital.  He also reports no alcohol consumption since leaving the hospital.    BRIEF HOSPITAL COURSE AND SUMMARY  [name] is a 73 y.o. male with medical history significant of alcohol abuse, chronic pain syndrome on chronic narcotic therapy,  IDDM, HTN, brought in by family member for evaluation of altered mentations. Patient is still confused unable to provide any history, and so all history provided by wife at bedside as well as from chart review.  Patient has been drinking, earlier this year he was seen in the alcohol rehab and remained sober until about 6 weeks ago.  And due to stressful life event patient started to pick up drinking again about 4 weeks ago, last drink was the day before presentation.  Patient was found by the wife in bathtub snoring heavily and not responding.  Wife herself has had severe knee problems and only able to help him to get out of bathtub.  Wife denied seeing any suspicious seizure-like movement.  EMS was subsequently called.  EMS reported that the patient had a short course of seizure activity 15-20 seconds, did not need any Ativan  and recovered consciousness symptoms of no tongue biting or loss control bowel movement or urination. ED Course: Febrile temperature 102.6 tachycardia borderline hypotensive not hypoxic.  Chest x-ray and CT chest abdomen pelvis showed bilateral lower field infiltrates compatible with aspiration pneumonia.  Liver cirrhosis with focal ascites around the liver, sodium 137 potassium 3.1 BUN 25 creatinine 0.9 lactic acid 3.80 ammonia 26, CK20 355, AST 132 ALT 47 bilirubin 3.7.  No CBD dilatation, no signs to suspect acute cholecystitis.  WBC 19.5, hemoglobin 6.1.  Hospital course.  Patient was subsequently admitted for severe sepsis due to multifocal pneumonia, possibly aspiration, complicated by acute metabolic encephalopathy, lactic acidosis and is  being monitored closely for alcohol withdrawal given recent relapse of drinking for the past several weeks.  Patient underwent EEG on 10/29/2023 and was found to have had some concerns of subclinical seizure and patient was subsequently initiated on Keppra  by neurologist.  Since patient's encephalopathy have not improved, neurologist is concerned  that patient is having intermittent subclinical seizures and the next step is to obtain long-term continuous EEG in order to determine if patient would need more aggressive antiepileptic drugs.  Neurologist have therefore recommended transfer for continuous EEG.  MRI of the brain initially was reported as having intraparenchymal hemorrhage within the right cerebellum however comparing it with the CTA showed that this area of abnormal diffusion weighted imaging in the right cerebellar hemisphere is more consistent with a subacute infarct and there is no hemorrhage.  Radiologist have subsequently addended the MRI report.  Neurologist on-call Dr. Elida Ross is aware of patient and needs to be consulted upon arrival.   Assessment and Plan:   Severe sepsis due to multifocal pneumonia, possible aspiration Positive blood culture - 1/4 bottles +Staph epi, suspect contaminant Sepsis with acute endorgan damage of acute encephalopathy and lactic acidosis, as evidenced by fever, leukocytosis, tachycardia --Continue Rocephin , Flagyl  --Added Doxycycline  for atypical coverage --Continue Mucinex  --Monitor O2 sats and supplement O2 if < 90% on room air --Continue aspiration precautions     Acute metabolic encephalopathy Seizure, clinically suspect alcohol withdrawal seizure by history provided on admission --Seizure precautions --CIWA protocol with as needed benzos --Delirium precautions --EEG showed subclinical seizure --Neurology is consulted and Keppra  initiated Neurologist as recommended the patient be transferred to [name] for continuous EEG Dr. Elida Ross the neurologist on-call at [facility] aware of patient and should be consulted on arrival.  OTHER MEDICAL CONDITIONS:  alcohol abuse, in remission since April of this year, chronic pain syndrome, hypertension, hyperlipidemia, diabetes, heart disease   REVIEW OF SYSTEMS: Full 14 system review of systems performed and negative with exception  of: As noted in the HPI   ALLERGIES: Allergies  Allergen Reactions   Hydrochlorothiazide Other (See Comments)    Reaction:  Hyponatremia   Hydrochlorothiazide Other (See Comments)    Hyponatremia - High sodium in the blood    HOME MEDICATIONS: Outpatient Medications Prior to Visit  Medication Sig Dispense Refill   Accu-Chek FastClix Lancets MISC USE TO CHECK GLUCOSE BEFORE MEALS AND AS NEEDED 102 each 12   acetaminophen  (TYLENOL ) 500 MG tablet Take 1,000 mg by mouth every 6 (six) hours as needed.     allopurinol  (ZYLOPRIM ) 100 MG tablet TAKE 1 TABLET BY MOUTH EVERY DAY 90 tablet 4   aspirin  81 MG chewable tablet Chew 1 tablet (81 mg total) by mouth daily. 30 tablet 1   BD PEN NEEDLE MINI ULTRAFINE 31G X 5 MM MISC USE 1 TO CHECK BLOOD SUGAR 3 TIMES DAILY 100 each 4   carvedilol  (COREG ) 3.125 MG tablet TAKE 1 TABLET BY MOUTH 2 TIMES DAILY. 180 tablet 0   clobetasol (TEMOVATE) 0.05 % external solution Apply 1 Application topically 2 (two) times daily as needed.     Continuous Blood Gluc Receiver (FREESTYLE LIBRE 14 DAY READER) DEVI 1 Device by Does not apply route every 14 (fourteen) days. for type insulin  dependent type 2 diabetes 1 each 12   Continuous Glucose Sensor (FREESTYLE LIBRE 3 PLUS SENSOR) MISC Change sensor every 15 days. 2 each 11   eplerenone  (INSPRA ) 25 MG tablet TAKE 1 TABLET BY MOUTH TWICE A DAY 60 tablet  5   folic acid  (FOLVITE ) 1 MG tablet Take 1 tablet (1 mg total) by mouth daily.     furosemide  (LASIX ) 20 MG tablet TAKE 2 TABLETS BY MOUTH 2 TIMES DAILY. 360 tablet 3   insulin  aspart (NOVOLOG  FLEXPEN) 100 UNIT/ML FlexPen 0-9 Units, Subcutaneous, 3 times daily with meals CBG < 70: Implement Hypoglycemia measures CBG 70 - 120: 0 units CBG 121 - 150: 1 unit CBG 151 - 200: 2 units CBG 201 - 250: 3 units CBG 251 - 300: 5 units CBG 301 - 350: 7 units CBG 351 - 400: 9 units CBG > 400: call MD     insulin  degludec (TRESIBA  FLEXTOUCH) 100 UNIT/ML FlexTouch Pen INJECT UP TO 20 UNITS  UNDER THE SKIN EVERY DAY AT BEDTIME. NEED APPT FOR REFILLS. 15 mL 2   Iron , Ferrous Sulfate , 325 (65 Fe) MG TABS Take 325 mg by mouth daily.     lisinopril  (ZESTRIL ) 5 MG tablet Take 5 mg by mouth daily.     magnesium  gluconate (MAGONATE) 500 MG tablet Take 500 mg by mouth daily.      niacin 500 MG tablet Take 500 mg by mouth at bedtime.     NOVOLOG  FLEXPEN 100 UNIT/ML FlexPen INJECT 20 UNITS UP TO THREE TIMES A DAY BEFORE MEALS. (Patient taking differently: 5-20 Units 3 (three) times daily with meals. Inject 20 units up to three times a day before meals. SS) 45 mL 0   omeprazole  (PRILOSEC) 40 MG capsule TAKE 1 CAPSULE BY MOUTH EVERY DAY 90 capsule 1   ONETOUCH VERIO test strip USE TO CHECK SUGAR THREE TIMES DAILY FOR INSULIN  DEPENDENT TYPE 2 DIABETES 100 strip 4   promethazine  (PHENERGAN ) 25 MG tablet Take 1 tablet (25 mg total) by mouth every 8 (eight) hours as needed. 20 tablet 3   pyridOXINE (VITAMIN B-6) 50 MG tablet Take 50 mg by mouth daily.     sertraline  (ZOLOFT ) 100 MG tablet TAKE 1 TABLET BY MOUTH EVERY DAY 90 tablet 4   sodium zirconium cyclosilicate  (LOKELMA ) 5 g packet Take 5 g by mouth daily. 30 packet 5   spironolactone  (ALDACTONE ) 100 MG tablet Take 100 mg by mouth daily.     thiamine  (VITAMIN B-1) 100 MG tablet Take 1 tablet (100 mg total) by mouth daily.     traMADol  (ULTRAM ) 50 MG tablet TAKE 1 TABLET BY MOUTH EVERY 8 HOURS AS NEEDED. 15 tablet 2   zinc gluconate 50 MG tablet Take 50 mg by mouth daily.     zinc sulfate, 50mg  elemental zinc, 220 (50 Zn) MG capsule Take 220 mg by mouth daily.     levETIRAcetam  (KEPPRA ) 750 MG tablet Take 2 tablets (1,500 mg total) by mouth 2 (two) times daily. 120 tablet 1   lidocaine  (LIDODERM ) 5 % Place 1 patch onto the skin daily. Remove & Discard patch within 12 hours or as directed by MD (Patient not taking: Reported on 01/27/2024) 30 patch 0   Multiple Vitamin (MULTIVITAMIN WITH MINERALS) TABS tablet Take 1 tablet by mouth daily. (Patient  not taking: Reported on 01/27/2024)     Facility-Administered Medications Prior to Visit  Medication Dose Route Frequency Provider Last Rate Last Admin   albumin  human 25 % solution 25 g  25 g Intravenous Once         PAST MEDICAL HISTORY: Past Medical History:  Diagnosis Date   Alcohol abuse    Alcoholism (HCC)    Anxiety    Aortic atherosclerosis (HCC)  Arthritis    Ascites    Aspiration pneumonia (HCC) 10/28/2023   Cancer (HCC)    Chronic pancreatitis (HCC)    Cirrhosis with alcoholism (HCC)    Colon polyp    COVID-19    DDD (degenerative disc disease), cervical    Depression    DM (diabetes mellitus) (HCC) 10/31/2023   DM (diabetes mellitus), type 2 (HCC)    Dyspnea    ED (erectile dysfunction)    Elevated liver enzymes    Esophageal varices (HCC)    Esophagitis    Gastric catarrh    Gastric varices    Gastritis    GERD (gastroesophageal reflux disease)    Gout    H. pylori infection    H/O gynecomastia    Hearing loss in right ear    Hernia, inguinal, left 03/20/2015   History of kidney stones    History of portal hypertension    HTN (hypertension) 10/31/2023   Hyperkalemia    Hyperlipidemia    Hypertension    Hyponatremia    IDA (iron  deficiency anemia)    Lipodystrophy    Obesity    Odynophagia    Pancreatitis    Refusal of blood transfusions as patient is Jehovah's Witness    Right inguinal hernia    Seizure (HCC)    as a child-age 7-none since-happened from fall as a child   Seizures (HCC)    Seizures (HCC) 12/01/2023   Severe sepsis (HCC) 10/28/2023   Splenic vein thrombosis    Splenomegaly    Ventral hernia    Vitamin D  deficiency     PAST SURGICAL HISTORY: Past Surgical History:  Procedure Laterality Date   CHOLECYSTECTOMY  2006   COLONOSCOPY WITH ESOPHAGOGASTRODUODENOSCOPY (EGD)     COLONOSCOPY WITH PROPOFOL  N/A 11/21/2015   Procedure: COLONOSCOPY WITH PROPOFOL ;  Surgeon: Lamar ONEIDA Holmes, MD;  Location: [facility] ENDOSCOPY;  Service:  Endoscopy;  Laterality: N/A;   ESOPHAGOGASTRODUODENOSCOPY (EGD) WITH PROPOFOL  N/A 08/21/2019   Procedure: ESOPHAGOGASTRODUODENOSCOPY (EGD) WITH PROPOFOL ;  Surgeon: Jinny Carmine, MD;  Location: [facility] ENDOSCOPY;  Service: Endoscopy;  Laterality: N/A;   HERNIA REPAIR     Inguinal Hernia Repai. Left: 07/28/2013 Dr. Eluterio; 2nd repair done 04/30/2014   INSERTION OF MESH N/A 04/10/2023   Procedure: INSERTION OF MESH;  Surgeon: Lane Shope, MD;  Location: [facility] ORS;  Service: General;  Laterality: N/A;   TONSILLECTOMY     XI ROBOTIC ASSISTED VENTRAL HERNIA N/A 04/10/2023   Procedure: XI ROBOTIC ASSISTED VENTRAL HERNIA;  Surgeon: Lane Shope, MD;  Location: [facility] ORS;  Service: General;  Laterality: N/A;    FAMILY HISTORY: Family History  Problem Relation Age of Onset   Colon polyps Father    Lung cancer Father    Hypertension Father    Dementia Mother    Hypertension Brother    Heart Problems Brother    Hypertension Brother    Diabetes Brother        type 2   Congestive Heart Failure Brother     SOCIAL HISTORY: Social History   Socioeconomic History   Marital status: Married    Spouse name: Candy   Number of children: 3   Years of education: Not on file   Highest education level: Some college, no degree  Occupational History   Occupation: retired  Tobacco Use   Smoking status: Former    Types: Cigarettes   Smokeless tobacco: Never  Vaping Use   Vaping status: Never Used  Substance and Sexual Activity  Alcohol use: Not Currently    Comment: drank few weeks ago   Drug use: Never   Sexual activity: Yes  Other Topics Concern   Not on file  Social History Narrative   ** Merged History Encounter **       Social Drivers of Health   Financial Resource Strain: Low Risk  (01/07/2024)   Received from [facility]   Overall Financial Resource Strain (CARDIA)    Difficulty of Paying Living Expenses: Not hard at all  Food Insecurity: No Food Insecurity  (01/07/2024)   Received from [facility]   Hunger Vital Sign    Within the past 12 months, you worried that your food would run out before you got the money to buy more.: Never true    Within the past 12 months, the food you bought just didn't last and you didn't have money to get more.: Never true  Transportation Needs: No Transportation Needs (01/07/2024)   Received from [facility] - Transportation    In the past 12 months, has lack of transportation kept you from medical appointments or from getting medications?: No    Lack of Transportation (Non-Medical): No  Physical Activity: Insufficiently Active (07/30/2023)   Exercise Vital Sign    Days of Exercise per Week: 3 days    Minutes of Exercise per Session: 10 min  Stress: No Stress Concern Present (07/30/2023)   [employer] of Occupational [employer] - Occupational Stress Questionnaire    Feeling of Stress : Only a little  Social Connections: Moderately Integrated (10/31/2023)   Social Connection and Isolation Panel    Frequency of Communication with Friends and Family: More than three times a week    Frequency of Social Gatherings with Friends and Family: Three times a week    Attends Religious Services: More than 4 times per year    Active Member of Clubs or Organizations: No    Attends Banker Meetings: Never    Marital Status: Married  Catering manager Violence: Not At Risk (10/31/2023)   Humiliation, Afraid, Rape, and Kick questionnaire    Fear of Current or Ex-Partner: No    Emotionally Abused: No    Physically Abused: No    Sexually Abused: No    PHYSICAL EXAM  GENERAL EXAM/CONSTITUTIONAL: Vitals:  Vitals:   01/27/24 1507  BP: 124/76  Pulse: 60  Weight: 133 lb 9.6 oz (60.6 kg)  Height: 5' 5 (1.651 m)   Body mass index is 22.23 kg/m. Wt Readings from Last 3 Encounters:  01/27/24 133 lb 9.6 oz (60.6 kg)  01/21/24 130 lb 6.4 oz (59.1 kg)  01/01/24 151 lb  0.2 oz (68.5 kg)   Patient is in no distress; well developed, nourished and groomed; neck is supple  MUSCULOSKELETAL: Gait, strength, tone, movements noted in Neurologic exam below  NEUROLOGIC: MENTAL STATUS:      No data to display         awake, alert, oriented to person, place and time recent and remote memory intact normal attention and concentration language fluent, comprehension intact, naming intact fund of knowledge appropriate  CRANIAL NERVE:  2nd, 3rd, 4th, 6th - Visual fields full to confrontation, extraocular muscles intact, no nystagmus 5th - facial sensation symmetric 7th - facial strength symmetric 8th - hearing intact 9th - palate elevates symmetrically, uvula midline 11th - shoulder shrug symmetric 12th - tongue protrusion midline  MOTOR:  normal bulk and tone, full strength  in the BUE, BLE  SENSORY:  normal and symmetric to light touch  COORDINATION:  finger-nose-finger, fine finger movements normal  GAIT/STATION:  Wide base, unsteady, unable to tandem walk    DIAGNOSTIC DATA (LABS, IMAGING, TESTING) - I reviewed patient records, labs, notes, testing and imaging myself where available.  Lab Results  Component Value Date   WBC 5.0 01/01/2024   HGB 9.0 (L) 01/01/2024   HCT 29.0 (L) 01/01/2024   MCV 86.3 01/01/2024   PLT 225 01/01/2024      Component Value Date/Time   NA 132 (L) 01/01/2024 1209   NA 133 (L) 12/13/2023 1341   NA 136 04/27/2014 1452   K 4.2 01/01/2024 1209   K 4.5 04/27/2014 1452   CL 96 (L) 01/01/2024 1209   CL 97 (L) 04/27/2014 1452   CO2 28 01/01/2024 1209   CO2 30 04/27/2014 1452   GLUCOSE 234 (H) 01/01/2024 1209   GLUCOSE 264 (H) 04/27/2014 1452   BUN 15 01/01/2024 1209   BUN 9 12/13/2023 1341   BUN 9 04/27/2014 1452   CREATININE 0.74 01/01/2024 1209   CREATININE 0.81 06/10/2017 1110   CALCIUM 8.0 (L) 01/01/2024 1209   CALCIUM 8.7 04/27/2014 1452   PROT 5.7 (L) 01/01/2024 1209   PROT 6.3 11/29/2023 1433    PROT 7.2 02/24/2014 1443   ALBUMIN  2.6 (L) 01/01/2024 1209   ALBUMIN  3.3 (L) 12/13/2023 1341   ALBUMIN  3.3 (L) 02/24/2014 1443   AST 30 01/01/2024 1209   AST 32 02/24/2014 1443   ALT 13 01/01/2024 1209   ALT 17 02/24/2014 1443   ALKPHOS 156 (H) 01/01/2024 1209   ALKPHOS 142 (H) 02/24/2014 1443   BILITOT 0.8 01/01/2024 1209   BILITOT 0.8 11/29/2023 1433   BILITOT 1.3 (H) 02/24/2014 1443   GFRNONAA >60 01/01/2024 1209   GFRNONAA 93 06/10/2017 1110   GFRAA 111 03/03/2020 1343   GFRAA 107 06/10/2017 1110   Lab Results  Component Value Date   CHOL 39 11/02/2023   HDL 17 (L) 11/02/2023   LDLCALC 15 11/02/2023   TRIG 37 11/02/2023   CHOLHDL 2.3 11/02/2023   Lab Results  Component Value Date   HGBA1C 5.7 (H) 10/28/2023   Lab Results  Component Value Date   VITAMINB12 1,331 (H) 11/02/2023   Lab Results  Component Value Date   TSH 4.762 (H) 11/02/2023    MRI Brain 10/30/2023 Intraparenchymal hemorrhage within the right cerebellum measuring 3.2 x 1.7 cm, new since the earlier head CT.  ADDENDUM: Subsequently obtained CTA of the head and neck shows that the area of abnormal diffusion weighted imaging in the right cerebellar hemisphere is more consistent with a subacute infarction. There is no hemorrhage.   CTA Head and Neck 10/30/2023 1. No emergent large vessel occlusion or high-grade stenosis of the intracranial arteries. 2. 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 3. Biapical opacities, right greater than left, which could indicate infection or pulmonary edema   Echo 11/01/2023 Left ventricular ejection fraction, by estimation, is 55 to 60%. The  left ventricle has normal function. Left ventricular endocardial border  not optimally defined to evaluate regional wall motion. Left ventricular  diastolic parameters are consistent  with Grade I diastolic dysfunction (impaired relaxation).    ASSESSMENT AND PLAN  73 y.o. year old male with history of alcohol  abuse, in remission since April of this year, chronic pain syndrome, hypertension, hyperlipidemia, diabetes, heart disease who is presenting after being admitted to the hospital for altered mental  status, pneumonia, seizures and new stroke on MRI.   Patient stroke etiology likely large vessel disease but cannot rule out cardioembolic.  He has completed cardiac monitor and no evidence of atrial fibrillation.  He is currently on aspirin , no statin since LDL is 15.  Plan for patient is to continue aspirin  daily.   For his seizures, he is doing well on Keppra  1500 mg twice daily, denies any side effect from the medication.  Plan will be to continue with Keppra  1500 mg twice daily and at next visit in a year, if he remains seizure-free, will decrease it to 1000 mg twice daily.  We also discussed driving restriction for a total of 6 months.  He voiced understanding.  Plan for now is for patient to continue current medications, continue to follow-up with his doctors, will send him to physical therapy for his gait abnormality and I will see him in 1 year for follow-up or sooner if worse.   1. Seizures (HCC)   2. 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 Abnormal gait      Patient Instructions  Continue current medications including levetiracetam  1500 mg twice daily Will check Keppra  level Continue daily aspirin  Continue to follow-up with your doctors Follow-up in 1 year or sooner if worse.  At next visit if no seizures, we will decrease levetiracetam  to 1000 mg twice daily Discussed driving restriction for total of 24-months.  Both patient and daughter voiced understanding.  Orders Placed This Encounter  Procedures   Levetiracetam  level   Ambulatory referral to Physical Therapy    Meds ordered this encounter  Medications   levETIRAcetam  (KEPPRA ) 750 MG tablet    Sig: Take 2 tablets (1,500 mg total) by mouth 2 (two) times daily.    Dispense:  120  tablet    Refill:  11    Return in about 1 year (around 01/26/2025).    Pastor Falling, MD 01/27/2024, 4:20 PM  [facility] [address], [address] [place], [place] [address] [phone]

## 2024-01-27 NOTE — Patient Instructions (Signed)
 Continue current medications including levetiracetam  1500 mg twice daily Will check Keppra  level Continue daily aspirin  Continue to follow-up with your doctors Follow-up in 1 year or sooner if worse.  At next visit if no seizures, we will decrease levetiracetam  to 1000 mg twice daily Discussed driving restriction for total of 78-months.  Both patient and daughter voiced understanding.

## 2024-01-28 ENCOUNTER — Ambulatory Visit: Payer: Self-pay | Admitting: Neurology

## 2024-01-28 ENCOUNTER — Other Ambulatory Visit: Payer: Self-pay | Admitting: Family Medicine

## 2024-01-28 ENCOUNTER — Ambulatory Visit: Payer: Self-pay | Admitting: Cardiology

## 2024-01-28 DIAGNOSIS — M4722 Other spondylosis with radiculopathy, cervical region: Secondary | ICD-10-CM

## 2024-01-28 DIAGNOSIS — M479 Spondylosis, unspecified: Secondary | ICD-10-CM

## 2024-01-28 LAB — LEVETIRACETAM LEVEL: Levetiracetam Lvl: 84.7 ug/mL — ABNORMAL HIGH (ref 10.0–40.0)

## 2024-01-30 ENCOUNTER — Other Ambulatory Visit: Payer: Self-pay

## 2024-01-30 ENCOUNTER — Ambulatory Visit: Admitting: Anesthesiology

## 2024-01-30 ENCOUNTER — Encounter: Payer: Self-pay | Admitting: Gastroenterology

## 2024-01-30 ENCOUNTER — Ambulatory Visit
Admission: RE | Admit: 2024-01-30 | Discharge: 2024-01-30 | Disposition: A | Discharge status: Home / Self Care | Attending: Gastroenterology | Admitting: Gastroenterology

## 2024-01-30 ENCOUNTER — Encounter
Admission: RE | Disposition: A | Discharge status: Home / Self Care | Payer: Self-pay | Source: Home / Self Care | Attending: Gastroenterology

## 2024-01-30 DIAGNOSIS — M109 Gout, unspecified: Secondary | ICD-10-CM | POA: Insufficient documentation

## 2024-01-30 DIAGNOSIS — E119 Type 2 diabetes mellitus without complications: Secondary | ICD-10-CM | POA: Diagnosis not present

## 2024-01-30 DIAGNOSIS — K766 Portal hypertension: Secondary | ICD-10-CM | POA: Diagnosis not present

## 2024-01-30 DIAGNOSIS — K648 Other hemorrhoids: Secondary | ICD-10-CM | POA: Insufficient documentation

## 2024-01-30 DIAGNOSIS — I10 Essential (primary) hypertension: Secondary | ICD-10-CM | POA: Diagnosis not present

## 2024-01-30 DIAGNOSIS — F418 Other specified anxiety disorders: Secondary | ICD-10-CM | POA: Insufficient documentation

## 2024-01-30 DIAGNOSIS — Z1211 Encounter for screening for malignant neoplasm of colon: Secondary | ICD-10-CM | POA: Diagnosis not present

## 2024-01-30 DIAGNOSIS — I350 Nonrheumatic aortic (valve) stenosis: Secondary | ICD-10-CM | POA: Diagnosis not present

## 2024-01-30 DIAGNOSIS — D649 Anemia, unspecified: Secondary | ICD-10-CM | POA: Insufficient documentation

## 2024-01-30 DIAGNOSIS — K219 Gastro-esophageal reflux disease without esophagitis: Secondary | ICD-10-CM | POA: Insufficient documentation

## 2024-01-30 DIAGNOSIS — D122 Benign neoplasm of ascending colon: Secondary | ICD-10-CM | POA: Diagnosis not present

## 2024-01-30 DIAGNOSIS — I868 Varicose veins of other specified sites: Secondary | ICD-10-CM | POA: Diagnosis not present

## 2024-01-30 DIAGNOSIS — I8500 Esophageal varices without bleeding: Secondary | ICD-10-CM | POA: Diagnosis not present

## 2024-01-30 DIAGNOSIS — R011 Cardiac murmur, unspecified: Secondary | ICD-10-CM | POA: Diagnosis not present

## 2024-01-30 DIAGNOSIS — K3189 Other diseases of stomach and duodenum: Secondary | ICD-10-CM | POA: Insufficient documentation

## 2024-01-30 DIAGNOSIS — M1990 Unspecified osteoarthritis, unspecified site: Secondary | ICD-10-CM | POA: Insufficient documentation

## 2024-01-30 DIAGNOSIS — K6389 Other specified diseases of intestine: Secondary | ICD-10-CM | POA: Diagnosis not present

## 2024-01-30 DIAGNOSIS — K649 Unspecified hemorrhoids: Secondary | ICD-10-CM | POA: Diagnosis not present

## 2024-01-30 DIAGNOSIS — K2940 Chronic atrophic gastritis without bleeding: Secondary | ICD-10-CM | POA: Diagnosis not present

## 2024-01-30 DIAGNOSIS — I8510 Secondary esophageal varices without bleeding: Secondary | ICD-10-CM | POA: Insufficient documentation

## 2024-01-30 DIAGNOSIS — K635 Polyp of colon: Secondary | ICD-10-CM | POA: Insufficient documentation

## 2024-01-30 DIAGNOSIS — K7030 Alcoholic cirrhosis of liver without ascites: Secondary | ICD-10-CM | POA: Diagnosis not present

## 2024-01-30 DIAGNOSIS — Z794 Long term (current) use of insulin: Secondary | ICD-10-CM | POA: Diagnosis not present

## 2024-01-30 DIAGNOSIS — Z87891 Personal history of nicotine dependence: Secondary | ICD-10-CM | POA: Insufficient documentation

## 2024-01-30 DIAGNOSIS — Z860101 Personal history of adenomatous and serrated colon polyps: Secondary | ICD-10-CM | POA: Diagnosis not present

## 2024-01-30 DIAGNOSIS — Z8673 Personal history of transient ischemic attack (TIA), and cerebral infarction without residual deficits: Secondary | ICD-10-CM | POA: Diagnosis not present

## 2024-01-30 HISTORY — PX: ESOPHAGOGASTRODUODENOSCOPY: SHX5428

## 2024-01-30 HISTORY — PX: POLYPECTOMY: SHX149

## 2024-01-30 HISTORY — PX: COLONOSCOPY: SHX5424

## 2024-01-30 LAB — GLUCOSE, CAPILLARY
Glucose-Capillary: 44 mg/dL — CL (ref 70–99)
Glucose-Capillary: 144 mg/dL — ABNORMAL HIGH (ref 70–99)

## 2024-01-30 SURGERY — COLONOSCOPY
Anesthesia: General

## 2024-01-30 MED ORDER — LIDOCAINE HCL (CARDIAC) PF 100 MG/5ML IV SOSY
PREFILLED_SYRINGE | INTRAVENOUS | Status: DC | PRN
  Administered 2024-01-30: 60 mg via INTRAVENOUS

## 2024-01-30 MED ORDER — EPHEDRINE SULFATE (PRESSORS) 50 MG/ML IJ SOLN
INTRAMUSCULAR | Status: DC | PRN
  Administered 2024-01-30 (×2): 5 mg via INTRAVENOUS

## 2024-01-30 MED ORDER — SODIUM CHLORIDE 0.9 % IV SOLN: INTRAVENOUS | Status: DC

## 2024-01-30 MED ORDER — PHENYLEPHRINE HCL (PRESSORS) 10 MG/ML IV SOLN
INTRAVENOUS | Status: DC | PRN
  Administered 2024-01-30 (×2): 80 ug via INTRAVENOUS

## 2024-01-30 MED ORDER — PROPOFOL 500 MG/50ML IV EMUL
INTRAVENOUS | Status: DC | PRN
  Administered 2024-01-30: 140 ug/kg/min via INTRAVENOUS

## 2024-01-30 MED ORDER — PROPOFOL 10 MG/ML IV BOLUS
INTRAVENOUS | Status: DC | PRN
  Administered 2024-01-30: 60 mg via INTRAVENOUS

## 2024-01-30 MED ORDER — DEXMEDETOMIDINE HCL IN NACL 80 MCG/20ML IV SOLN
INTRAVENOUS | Status: DC | PRN
  Administered 2024-01-30: 8 ug via INTRAVENOUS

## 2024-01-30 MED ORDER — DEXTROSE 50 % IV SOLN
1.0000 | Freq: Once | INTRAVENOUS | Status: AC
  Administered 2024-01-30: 50 mL via INTRAVENOUS

## 2024-01-30 MED ORDER — DEXTROSE 50 % IV SOLN
INTRAVENOUS | Status: AC
  Filled 2024-01-30: qty 50

## 2024-01-30 NOTE — Anesthesia Preprocedure Evaluation (Addendum)
 Anesthesia Evaluation  Patient identified by MRN, date of birth, ID band Patient awake    Reviewed: Allergy & Precautions, NPO status , Patient's Chart, lab work & pertinent test results  History of Anesthesia Complications Negative for: history of anesthetic complications  Airway Mallampati: I   Neck ROM: Full    Dental  (+) Edentulous Lower, Edentulous Upper   Pulmonary former smoker   Pulmonary exam normal breath sounds clear to auscultation       Cardiovascular hypertension, Normal cardiovascular exam+ Valvular Problems/Murmurs (mild AS)  Rhythm:Regular Rate:Normal  ECG 12/23/23: NSR; no STEMI  Myocardial perfusion 01/23/24:  Primary prevention stressed with the patient.  Importance of compliance with diet medication stressed and patient verbalized standing. Mild aortic stenosis: Stable and unremarkable.  Will continue to monitor. Preop cardiovascular assessment: He has multiple risk factors for coronary artery disease so we will do a Lexiscan  sestamibi.  If this is negative then he is not at high risk for coronary events during the aforementioned surgery.  Medical hemodynamic monitoring will further reduce the risk of coronary events. History of alcohol abuse and alcoholic cirrhosis: He was advised not to go back to alcohol. He will be seen in follow-up appointment on a as needed basis.    Neuro/Psych Seizures -,  PSYCHIATRIC DISORDERS Anxiety Depression    Alcohol use disorder, none in 4 months CVA (new 01/2024), No Residual Symptoms    GI/Hepatic ,GERD  ,,(+) Cirrhosis   Esophageal Varices      Endo/Other  diabetes, Type 2, Insulin  Dependent    Renal/GU Renal disease (nephrolithiasis)     Musculoskeletal  (+) Arthritis ,  Gout    Abdominal   Peds  Hematology  (+) Blood dyscrasia, anemia , REFUSES BLOOD PRODUCTS (albumin  acceptable), JEHOVAH'S WITNESS  Anesthesia Other Findings Cardiology note 01/21/24:    Primary prevention stressed with the patient.  Importance of compliance with diet medication stressed and patient verbalized standing. Mild aortic stenosis: Stable and unremarkable.  Will continue to monitor. Preop cardiovascular assessment: He has multiple risk factors for coronary artery disease so we will do a Lexiscan  sestamibi.  If this is negative then he is not at high risk for coronary events during the aforementioned surgery.  Medical hemodynamic monitoring will further reduce the risk of coronary events. History of alcohol abuse and alcoholic cirrhosis: He was advised not to go back to alcohol. He will be seen in follow-up appointment on a as needed basis.   Reproductive/Obstetrics                              Anesthesia Physical Anesthesia Plan  ASA: 4  Anesthesia Plan: General   Post-op Pain Management:    Induction: Intravenous  PONV Risk Score and Plan: 2 and Propofol  infusion, TIVA and Treatment may vary due to age or medical condition  Airway Management Planned: Natural Airway  Additional Equipment:   Intra-op Plan:   Post-operative Plan:   Informed Consent: I have reviewed the patients History and Physical, chart, labs and discussed the procedure including the risks, benefits and alternatives for the proposed anesthesia with the patient or authorized representative who has indicated his/her understanding and acceptance.       Plan Discussed with: CRNA  Anesthesia Plan Comments: (LMA/GETA backup discussed.  Patient consented for risks of anesthesia including but not limited to:  - adverse reactions to medications - damage to eyes, teeth, lips or other oral mucosa - nerve  damage due to positioning  - sore throat or hoarseness - damage to heart, brain, nerves, lungs, other parts of body or loss of life  Informed patient about role of CRNA in peri- and intra-operative care.  Patient voiced understanding.)         Anesthesia  Quick Evaluation

## 2024-01-30 NOTE — Interval H&P Note (Signed)
 History and Physical Interval Note: Preprocedure H&P from 01/30/24  was reviewed and there was no interval change after seeing and examining the patient.  Written consent was obtained from the patient after discussion of risks, benefits, and alternatives. 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  The various methods of treatment have been discussed with the patient and family. After consideration of risks, benefits and other options for treatment, the patient has consented to  Procedure(s) with comments: COLONOSCOPY (N/A) - IDDM EGD (ESOPHAGOGASTRODUODENOSCOPY) (N/A) as a surgical intervention.  The patient's history has been reviewed, patient examined, no change in status, stable for surgery.  I have reviewed the patient's chart and labs.  Questions were answered to the patient's satisfaction.     [name]

## 2024-01-30 NOTE — Op Note (Signed)
 [facility] Gastroenterology Patient Name: [name] Procedure Date: 01/30/2024 8:25 AM MRN: [id number] Account #: [id number] Date of Birth: 1950/08/06 Admit Type: Outpatient Age: 73 Room: [facility] ENDO ROOM 1 Gender: Male Note Status: Finalized Instrument Name: Colonoscope [id number] Procedure:             Colonoscopy Indications:           High risk colon cancer surveillance: Personal history                         of adenoma with villous component Providers:             [name] DO, DO Medicines:             Monitored Anesthesia Care Complications:         No immediate complications. Estimated blood loss:                         Minimal. Procedure:             Pre-Anesthesia Assessment:                        - Prior to the procedure, a History and Physical was                         performed, and patient medications and allergies were                         reviewed. The patient is competent. The risks and                         benefits of the procedure and the sedation options and                         risks were discussed with the patient. All questions                         were answered and informed consent was obtained.                         Patient identification and proposed procedure were                         verified by the physician, the nurse, the anesthetist                         and the technician in the endoscopy suite. Mental                         Status Examination: alert and oriented. Airway                         Examination: normal oropharyngeal airway and neck                         mobility. Respiratory Examination: poor air movement.                         CV Examination: systolic murmur. Prophylactic  Antibiotics: The patient does not require prophylactic                         antibiotics. Prior Anticoagulants: The patient has                         taken no anticoagulant or  antiplatelet agents. ASA                         Grade Assessment: IV - A patient with severe systemic                         disease that is a constant threat to life. After                         reviewing the risks and benefits, the patient was                         deemed in satisfactory condition to undergo the                         procedure. The anesthesia plan was to use monitored                         anesthesia care (MAC). Immediately prior to                         administration of medications, the patient was                         re-assessed for adequacy to receive sedatives. The                         heart rate, respiratory rate, oxygen saturations,                         blood pressure, adequacy of pulmonary ventilation, and                         response to care were monitored throughout the                         procedure. The physical status of the patient was                         re-assessed after the procedure.                        After obtaining informed consent, the colonoscope was                         passed under direct vision. Throughout the procedure,                         the patient's blood pressure, pulse, and oxygen                         saturations were monitored continuously. The  Colonoscope was introduced through the anus and                         advanced to the the terminal ileum, with                         identification of the appendiceal orifice and IC                         valve. The colonoscopy was performed without                         difficulty. The patient tolerated the procedure well.                         The quality of the bowel preparation was evaluated                         using the BBPS [facility] Bowel Preparation Scale) with                         scores of: Right Colon = 2 (minor amount of residual                         staining, small fragments of stool and/or opaque                          liquid, but mucosa seen well), Transverse Colon = 2                         (minor amount of residual staining, small fragments of                         stool and/or opaque liquid, but mucosa seen well) and                         Left Colon = 2 (minor amount of residual staining,                         small fragments of stool and/or opaque liquid, but                         mucosa seen well). The total BBPS score equals 6. Fair                         Prep. The terminal ileum, ileocecal valve, appendiceal                         orifice, and rectum were photographed. Findings:      Hemorrhoids were found on perianal exam.      The digital rectal exam was normal. Pertinent negatives include normal       sphincter tone.      The terminal ileum appeared normal. Estimated blood loss: none.      Retroflexion in the right colon was performed.      Two sessile polyps were found in the sigmoid colon and ascending colon.  The polyps were 1 to 2 mm in size. These polyps were removed with a       jumbo cold forceps. Resection and retrieval were complete. Estimated       blood loss was minimal.      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 Estimated blood loss:       none.      Mucosa would bleed easily- as seen with polyp resection and when mucosa       was suctioned. Estimated blood loss was minimal.      The exam was otherwise without abnormality on direct and retroflexion       views. Impression:            - Hemorrhoids found on perianal exam.                        - The examined portion of the ileum was normal.                        - Two 1 to 2 mm polyps in the sigmoid colon and in the                         ascending colon, removed with a jumbo cold forceps.                         Resected and retrieved.                        - Rectal varices.                        - The examination was otherwise normal on direct and                          retroflexion views. Recommendation:        - Patient has a contact number available for                         emergencies. The signs and symptoms of potential                         delayed complications were discussed with the patient.                         Return to normal activities tomorrow. Written                         discharge instructions were provided to the patient.                        - Discharge patient to home.                        - Resume previous diet.                        - Continue present medications.                        - No ibuprofen, naproxen, or other non-steroidal  anti-inflammatory drugs for 5 days after polyp removal.                        - Await pathology results.                        - Repeat colonoscopy for surveillance based on                         pathology results.                        - Return to GI office as previously scheduled.                        - The findings and recommendations were discussed with                         the patient.                        - The findings and recommendations were discussed with                         the patient's family. Procedure Code(s):     --- Professional ---                        [id number], Colonoscopy, flexible; with biopsy, single or                         multiple Diagnosis Code(s):     --- Professional ---                        Z86.010, Personal history of colonic polyps                        K64.9, Unspecified hemorrhoids                        D12.5, Benign neoplasm of sigmoid colon                        D12.2, Benign neoplasm of ascending colon                        K64.8, Other hemorrhoids CPT copyright 2022 [employer]. All rights reserved. The codes documented in this report are preliminary and upon coder review may  be revised to meet current compliance requirements. Attending Participation:      I personally performed the  entire procedure. Elspeth Jungling, DO [name] DO, DO 01/30/2024 9:12:38 AM This report has been signed electronically. Number of Addenda: 0 Note Initiated On: 01/30/2024 8:25 AM Scope Withdrawal Time: 0 hours 12 minutes 8 seconds  Total Procedure Duration: 0 hours 14 minutes 55 seconds  Estimated Blood Loss:  Estimated blood loss was minimal.      [facility]

## 2024-01-30 NOTE — Anesthesia Postprocedure Evaluation (Signed)
 Anesthesia Post Note  Patient: [name]  Procedure(s) Performed: COLONOSCOPY EGD (ESOPHAGOGASTRODUODENOSCOPY) POLYPECTOMY, INTESTINE  Patient location during evaluation: PACU Anesthesia Type: General Level of consciousness: awake and alert, oriented and patient cooperative Pain management: pain level controlled Vital Signs Assessment: post-procedure vital signs reviewed and stable Respiratory status: spontaneous breathing, nonlabored ventilation and respiratory function stable Cardiovascular status: blood pressure returned to baseline and stable Postop Assessment: adequate PO intake Anesthetic complications: no   No notable events documented.   Last Vitals:  Vitals:   01/30/24 0930 01/30/24 0941  BP: 96/75 111/66  Pulse: 62 (!) 58  Resp: 16 16  Temp:    SpO2: 100% 100%    Last Pain:  Vitals:   01/30/24 0900  TempSrc: Temporal  PainSc:                  Alfonso Ruths

## 2024-01-30 NOTE — H&P (Signed)
 Pre-Procedure H&P   Patient ID: [name] is a 73 y.o. male.  Gastroenterology Provider: [name], DO  PCP: Gasper Nancyann BRAVO, MD  Date: 01/30/2024  HPI [name] is a 73 y.o. male who presents today for Esophagogastroduodenoscopy and Colonoscopy for Esophageal varices screening, cirrhosis, personal history of colon polyps .  Patient's blood sugar was in the 40s this morning causing him delay. He denies any further dark stools.  Denies hematochezia hematemesis and coffee-ground emesis.  Unfortunately despite multiple requests the patient has not informed us  of which medications he is taking at home nor brought in a list.  Last EGD in 2021 with portal hypertensive gastropathy esophageal varices and reported gastric varix  Last colonoscopy in May 2017 with 1 adenomatous polyp and internal hemorrhoids  History of tubulovillous adenoma  Hemoglobin 10.5 MCV 87 platelets 295,000 creatinine 0.7 INR 1.1    Past Medical History:  Diagnosis Date   Alcohol abuse    Alcoholism (HCC)    Anxiety    Aortic atherosclerosis (HCC)    Arthritis    Ascites    Aspiration pneumonia (HCC) 10/28/2023   Cancer (HCC)    Chronic pancreatitis (HCC)    Cirrhosis with alcoholism (HCC)    Colon polyp    COVID-19    DDD (degenerative disc disease), cervical    Depression    DM (diabetes mellitus) (HCC) 10/31/2023   DM (diabetes mellitus), type 2 (HCC)    Dyspnea    ED (erectile dysfunction)    Elevated liver enzymes    Esophageal varices (HCC)    Esophagitis    Gastric catarrh    Gastric varices    Gastritis    GERD (gastroesophageal reflux disease)    Gout    H. pylori infection    H/O gynecomastia    Hearing loss in right ear    Hernia, inguinal, left 03/20/2015   History of kidney stones    History of portal hypertension    HTN (hypertension) 10/31/2023   Hyperkalemia    Hyperlipidemia    Hypertension    Hyponatremia    IDA (iron  deficiency anemia)     Lipodystrophy    Obesity    Odynophagia    Pancreatitis    Refusal of blood transfusions as patient is Jehovah's Witness    Right inguinal hernia    Seizure (HCC)    as a child-age 48-none since-happened from fall as a child   Seizures (HCC)    Seizures (HCC) 12/01/2023   Severe sepsis (HCC) 10/28/2023   Splenic vein thrombosis    Splenomegaly    Ventral hernia    Vitamin D  deficiency     Past Surgical History:  Procedure Laterality Date   CHOLECYSTECTOMY  2006   COLONOSCOPY WITH ESOPHAGOGASTRODUODENOSCOPY (EGD)     COLONOSCOPY WITH PROPOFOL  N/A 11/21/2015   Procedure: COLONOSCOPY WITH PROPOFOL ;  Surgeon: Lamar ONEIDA Holmes, MD;  Location: [facility] ENDOSCOPY;  Service: Endoscopy;  Laterality: N/A;   ESOPHAGOGASTRODUODENOSCOPY (EGD) WITH PROPOFOL  N/A 08/21/2019   Procedure: ESOPHAGOGASTRODUODENOSCOPY (EGD) WITH PROPOFOL ;  Surgeon: Jinny Carmine, MD;  Location: [facility] ENDOSCOPY;  Service: Endoscopy;  Laterality: N/A;   HERNIA REPAIR     Inguinal Hernia Repai. Left: 07/28/2013 Dr. Eluterio; 2nd repair done 04/30/2014   INSERTION OF MESH N/A 04/10/2023   Procedure: INSERTION OF MESH;  Surgeon: Lane Shope, MD;  Location: [facility] ORS;  Service: General;  Laterality: N/A;   TONSILLECTOMY     XI ROBOTIC ASSISTED VENTRAL HERNIA  N/A 04/10/2023   Procedure: XI ROBOTIC ASSISTED VENTRAL HERNIA;  Surgeon: Lane Shope, MD;  Location: [facility] ORS;  Service: General;  Laterality: N/A;    Family History Father- colon polyps No h/o GI disease or malignancy  Review of Systems  Constitutional:  Negative for activity change, appetite change, chills, diaphoresis, fatigue, fever and unexpected weight change.  HENT:  Negative for trouble swallowing and voice change.   Respiratory:  Negative for shortness of breath and wheezing.   Cardiovascular:  Negative for chest pain, palpitations and leg swelling.  Gastrointestinal:  Negative for abdominal distention, abdominal pain, anal bleeding, blood in stool,  constipation, diarrhea, nausea and vomiting.  Musculoskeletal:  Negative for arthralgias and myalgias.  Skin:  Negative for color change and pallor.  Neurological:  Negative for dizziness, syncope and weakness.  Psychiatric/Behavioral:  Negative for confusion. The patient is not nervous/anxious.   All other systems reviewed and are negative.    Medications No current facility-administered medications on file prior to encounter.   Current Outpatient Medications on File Prior to Encounter  Medication Sig Dispense Refill   acetaminophen  (TYLENOL ) 500 MG tablet Take 1,000 mg by mouth every 6 (six) hours as needed.     allopurinol  (ZYLOPRIM ) 100 MG tablet TAKE 1 TABLET BY MOUTH EVERY DAY 90 tablet 4   aspirin  81 MG chewable tablet Chew 1 tablet (81 mg total) by mouth daily. 30 tablet 1   BD PEN NEEDLE MINI ULTRAFINE 31G X 5 MM MISC USE 1 TO CHECK BLOOD SUGAR 3 TIMES DAILY 100 each 4   carvedilol  (COREG ) 3.125 MG tablet TAKE 1 TABLET BY MOUTH 2 TIMES DAILY. 180 tablet 0   clobetasol (TEMOVATE) 0.05 % external solution Apply 1 Application topically 2 (two) times daily as needed.     eplerenone  (INSPRA ) 25 MG tablet TAKE 1 TABLET BY MOUTH TWICE A DAY 60 tablet 5   furosemide  (LASIX ) 20 MG tablet TAKE 2 TABLETS BY MOUTH 2 TIMES DAILY. 360 tablet 3   lisinopril  (ZESTRIL ) 5 MG tablet Take 5 mg by mouth daily.     niacin 500 MG tablet Take 500 mg by mouth at bedtime.     omeprazole  (PRILOSEC) 40 MG capsule TAKE 1 CAPSULE BY MOUTH EVERY DAY 90 capsule 1   sertraline  (ZOLOFT ) 100 MG tablet TAKE 1 TABLET BY MOUTH EVERY DAY 90 tablet 4   sodium zirconium cyclosilicate  (LOKELMA ) 5 g packet Take 5 g by mouth daily. 30 packet 5   thiamine  (VITAMIN B-1) 100 MG tablet Take 1 tablet (100 mg total) by mouth daily.     Accu-Chek FastClix Lancets MISC USE TO CHECK GLUCOSE BEFORE MEALS AND AS NEEDED 102 each 12   Continuous Blood Gluc Receiver (FREESTYLE LIBRE 14 DAY READER) DEVI 1 Device by Does not apply route  every 14 (fourteen) days. for type insulin  dependent type 2 diabetes 1 each 12   Continuous Glucose Sensor (FREESTYLE LIBRE 3 PLUS SENSOR) MISC Change sensor every 15 days. 2 each 11   folic acid  (FOLVITE ) 1 MG tablet Take 1 tablet (1 mg total) by mouth daily.     insulin  aspart (NOVOLOG  FLEXPEN) 100 UNIT/ML FlexPen 0-9 Units, Subcutaneous, 3 times daily with meals CBG < 70: Implement Hypoglycemia measures CBG 70 - 120: 0 units CBG 121 - 150: 1 unit CBG 151 - 200: 2 units CBG 201 - 250: 3 units CBG 251 - 300: 5 units CBG 301 - 350: 7 units CBG 351 - 400: 9 units CBG > 400:  call MD     insulin  degludec (TRESIBA  FLEXTOUCH) 100 UNIT/ML FlexTouch Pen INJECT UP TO 20 UNITS UNDER THE SKIN EVERY DAY AT BEDTIME. NEED APPT FOR REFILLS. 15 mL 2   Iron , Ferrous Sulfate , 325 (65 Fe) MG TABS Take 325 mg by mouth daily.     lidocaine  (LIDODERM ) 5 % Place 1 patch onto the skin daily. Remove & Discard patch within 12 hours or as directed by MD (Patient not taking: Reported on 01/27/2024) 30 patch 0   magnesium  gluconate (MAGONATE) 500 MG tablet Take 500 mg by mouth daily.      Multiple Vitamin (MULTIVITAMIN WITH MINERALS) TABS tablet Take 1 tablet by mouth daily. (Patient not taking: Reported on 01/27/2024)     NOVOLOG  FLEXPEN 100 UNIT/ML FlexPen INJECT 20 UNITS UP TO THREE TIMES A DAY BEFORE MEALS. (Patient taking differently: 5-20 Units 3 (three) times daily with meals. Inject 20 units up to three times a day before meals. SS) 45 mL 0   ONETOUCH VERIO test strip USE TO CHECK SUGAR THREE TIMES DAILY FOR INSULIN  DEPENDENT TYPE 2 DIABETES 100 strip 4   promethazine  (PHENERGAN ) 25 MG tablet Take 1 tablet (25 mg total) by mouth every 8 (eight) hours as needed. 20 tablet 3   pyridOXINE (VITAMIN B-6) 50 MG tablet Take 50 mg by mouth daily.     traMADol  (ULTRAM ) 50 MG tablet TAKE 1 TABLET BY MOUTH EVERY 8 HOURS AS NEEDED. 15 tablet 2   zinc gluconate 50 MG tablet Take 50 mg by mouth daily.     zinc sulfate, 50mg  elemental  zinc, 220 (50 Zn) MG capsule Take 220 mg by mouth daily.      Pertinent medications related to GI and procedure were reviewed by me with the patient prior to the procedure   Current Facility-Administered Medications:    0.9 %  sodium chloride  infusion, , Intravenous, Continuous, Onita Elspeth Sharper, DO, Last Rate: 20 mL/hr at 01/30/24 0726, New Bag at 01/30/24 0726  sodium chloride  20 mL/hr at 01/30/24 9273       Allergies  Allergen Reactions   Hydrochlorothiazide Other (See Comments)    Reaction:  Hyponatremia   Hydrochlorothiazide Other (See Comments)    Hyponatremia - High sodium in the blood   Allergies were reviewed by me prior to the procedure  Objective   Body mass index is 22.23 kg/m. Vitals:   01/30/24 0717  BP: 127/78  Pulse: (!) 52  Resp: 17  Temp: (!) 96.8 F (36 C)  TempSrc: Temporal  SpO2: 100%  Weight: 60.6 kg  Height: 5' 5 (1.651 m)     Physical Exam Vitals and nursing note reviewed.  Constitutional:      General: He is not in acute distress.    Appearance: He is ill-appearing (chronically). He is not toxic-appearing or diaphoretic.  HENT:     Head: Normocephalic and atraumatic.     Nose: Nose normal.     Mouth/Throat:     Mouth: Mucous membranes are moist.     Pharynx: Oropharynx is clear.  Eyes:     General: No scleral icterus.    Extraocular Movements: Extraocular movements intact.  Cardiovascular:     Rate and Rhythm: Regular rhythm. Bradycardia present.     Heart sounds: Murmur heard.  Pulmonary:     Effort: Pulmonary effort is normal. No respiratory distress.     Breath sounds: Normal breath sounds. No wheezing, rhonchi or rales.  Abdominal:     General: Bowel sounds  are normal.     Palpations: Abdomen is soft.     Tenderness: There is no abdominal tenderness. There is no guarding or rebound.  Musculoskeletal:     Cervical back: Neck supple.     Right lower leg: No edema.     Left lower leg: No edema.  Skin:    General: Skin  is warm and dry.     Coloration: Skin is not jaundiced or pale.  Neurological:     General: No focal deficit present.     Mental Status: He is alert and oriented to person, place, and time. Mental status is at baseline.  Psychiatric:        Mood and Affect: Mood normal.        Behavior: Behavior normal.        Thought Content: Thought content normal.        Judgment: Judgment normal.      Assessment:  [name] is a 73 y.o. male  who presents today for Esophagogastroduodenoscopy and Colonoscopy for Esophageal varices screening, cirrhosis, personal history of colon polyps .  Plan:  Esophagogastroduodenoscopy and Colonoscopy with possible intervention today  Esophagogastroduodenoscopy and Colonoscopy with possible biopsy, control of bleeding, polypectomy, and interventions as necessary has been discussed with the patient/patient representative. Informed consent was obtained from the patient/patient representative after explaining the indication, nature, and risks of the procedure including but not limited to death, bleeding, perforation, missed neoplasm/lesions, cardiorespiratory compromise, and reaction to medications. Opportunity for questions was given and appropriate answers were provided. Patient/patient representative has verbalized understanding is amenable to undergoing the procedure.   [name], DO  [facility] Gastroenterology  Portions of the record may have been created with voice recognition software. Occasional wrong-word or 'sound-a-like' substitutions may have occurred due to the inherent limitations of voice recognition software.  Read the chart carefully and recognize, using context, where substitutions may have occurred.

## 2024-01-30 NOTE — Op Note (Signed)
 [facility] Gastroenterology Patient Name: [name] Procedure Date: 01/30/2024 8:27 AM MRN: [id number] Account #: [id number] Date of Birth: May 03, 1951 Admit Type: Outpatient Age: 73 Room: [facility] ENDO ROOM 1 Gender: Male Note Status: Finalized Instrument Name: Upper Endoscope [id number] Procedure:             Upper GI endoscopy Indications:           Follow-up of esophageal varices Providers:             [name] DO, DO Medicines:             Monitored Anesthesia Care Complications:         No immediate complications. Estimated blood loss: None. Procedure:             Pre-Anesthesia Assessment:                        - Prior to the procedure, a History and Physical was                         performed, and patient medications and allergies were                         reviewed. The patient is competent. The risks and                         benefits of the procedure and the sedation options and                         risks were discussed with the patient. All questions                         were answered and informed consent was obtained.                         Patient identification and proposed procedure were                         verified by the physician, the nurse, the anesthetist                         and the technician in the endoscopy suite. Mental                         Status Examination: alert and oriented. Airway                         Examination: normal oropharyngeal airway and neck                         mobility. Respiratory Examination: clear to                         auscultation. CV Examination: RRR, no murmurs, no S3                         or S4. Prophylactic Antibiotics: The patient does not  require prophylactic antibiotics. Prior                         Anticoagulants: The patient has taken no anticoagulant                         or antiplatelet agents. ASA Grade Assessment: IV - A                          patient with severe systemic disease that is a                         constant threat to life. After reviewing the risks and                         benefits, the patient was deemed in satisfactory                         condition to undergo the procedure. The anesthesia                         plan was to use monitored anesthesia care (MAC).                         Immediately prior to administration of medications,                         the patient was re-assessed for adequacy to receive                         sedatives. The heart rate, respiratory rate, oxygen                         saturations, blood pressure, adequacy of pulmonary                         ventilation, and response to care were monitored                         throughout the procedure. The physical status of the                         patient was re-assessed after the procedure.                        After obtaining informed consent, the endoscope was                         passed under direct vision. Throughout the procedure,                         the patient's blood pressure, pulse, and oxygen                         saturations were monitored continuously. The Endoscope                         was introduced through the mouth, and advanced to the  third part of duodenum. The upper GI endoscopy was                         accomplished without difficulty. The patient tolerated                         the procedure well. Findings:      The duodenal bulb, first portion of the duodenum and second portion of       the duodenum were normal. Estimated blood loss: none.      The Z-line was regular. Estimated blood loss: none.      Esophagogastric landmarks were identified: the gastroesophageal junction       was found at 40 cm from the incisors.      Grade I varices were found in the lower third of the esophagus.       Estimated blood loss: none.      The exam of the esophagus was  otherwise normal.      Diffuse atrophic mucosa was found in the entire examined stomach.       Estimated blood loss: none.      Mild portal hypertensive gastropathy was found in the gastric body.       Estimated blood loss: none.      The gastroesophageal flap valve was visualized endoscopically and       classified as Hill Grade II (fold present, opens with respiration).       Estimated blood loss: none.      No signs of GAVE or gastric varices, Estimated blood loss: none. Impression:            - Normal duodenal bulb, first portion of the duodenum                         and second portion of the duodenum.                        - Z-line regular.                        - Esophagogastric landmarks identified.                        - Grade I esophageal varices.                        - Gastric mucosal atrophy.                        - Portal hypertensive gastropathy.                        - Gastroesophageal flap valve classified as Hill Grade                         II (fold present, opens with respiration).                        - No specimens collected. Recommendation:        - Patient has a contact number available for                         emergencies. The signs  and symptoms of potential                         delayed complications were discussed with the patient.                         Return to normal activities tomorrow. Written                         discharge instructions were provided to the patient.                        - Discharge patient to home.                        - Resume previous diet.                        - Continue present medications.                        - Repeat upper endoscopy in 1 year for surveillance.                        - Return to GI office as previously scheduled.                        - The findings and recommendations were discussed with                         the patient.                        - The findings and recommendations  were discussed with                         the patient's family. Procedure Code(s):     --- Professional ---                        [phone], Esophagogastroduodenoscopy, flexible,                         transoral; diagnostic, including collection of                         specimen(s) by brushing or washing, when performed                         (separate procedure) Diagnosis Code(s):     --- Professional ---                        I85.00, Esophageal varices without bleeding                        K31.89, Other diseases of stomach and duodenum                        K76.6, Portal hypertension CPT copyright 2022 American [employer] Association. All rights reserved. The codes documented in this report are preliminary and upon coder review may  be revised to meet current compliance  requirements. Attending Participation:      I personally performed the entire procedure. Elspeth Jungling, DO [name] DO, DO 01/30/2024 8:48:03 AM This report has been signed electronically. Number of Addenda: 0 Note Initiated On: 01/30/2024 8:27 AM Estimated Blood Loss:  Estimated blood loss: none.      [facility]

## 2024-01-30 NOTE — Transfer of Care (Signed)
 Immediate Anesthesia Transfer of Care Note  Patient: [name]  Procedure(s) Performed: COLONOSCOPY EGD (ESOPHAGOGASTRODUODENOSCOPY) POLYPECTOMY, INTESTINE  Patient Location: PACU  Anesthesia Type:General  Level of Consciousness: awake, alert , and oriented  Airway & Oxygen Therapy: Patient Spontanous Breathing  Post-op Assessment: Report given to RN and Post -op Vital signs reviewed and stable  Post vital signs: reviewed and stable  Last Vitals:  Vitals Value Taken Time  BP 98/72 01/30/24 09:10  Temp    Pulse 70 01/30/24 09:11  Resp 17 01/30/24 09:13  SpO2 100 % 01/30/24 09:12  Vitals shown include unfiled device data.  Last Pain:  Vitals:   01/30/24 0900  TempSrc: Temporal  PainSc:          Complications: No notable events documented.

## 2024-01-31 ENCOUNTER — Encounter: Payer: Self-pay | Admitting: Gastroenterology

## 2024-01-31 ENCOUNTER — Ambulatory Visit
Admission: RE | Admit: 2024-01-31 | Discharge: 2024-01-31 | Disposition: A | Discharge status: Home / Self Care | Source: Ambulatory Visit | Attending: Gastroenterology | Admitting: Gastroenterology

## 2024-01-31 DIAGNOSIS — K7031 Alcoholic cirrhosis of liver with ascites: Secondary | ICD-10-CM | POA: Diagnosis not present

## 2024-01-31 DIAGNOSIS — K760 Fatty (change of) liver, not elsewhere classified: Secondary | ICD-10-CM | POA: Diagnosis not present

## 2024-01-31 DIAGNOSIS — R188 Other ascites: Secondary | ICD-10-CM | POA: Diagnosis not present

## 2024-01-31 DIAGNOSIS — K7689 Other specified diseases of liver: Secondary | ICD-10-CM | POA: Diagnosis not present

## 2024-01-31 DIAGNOSIS — K7460 Unspecified cirrhosis of liver: Secondary | ICD-10-CM | POA: Diagnosis not present

## 2024-01-31 LAB — SURGICAL PATHOLOGY

## 2024-02-01 ENCOUNTER — Other Ambulatory Visit: Payer: Self-pay | Admitting: Family Medicine

## 2024-02-01 DIAGNOSIS — K219 Gastro-esophageal reflux disease without esophagitis: Secondary | ICD-10-CM

## 2024-02-02 DIAGNOSIS — I6310 Cerebral infarction due to embolism of unspecified precerebral artery: Secondary | ICD-10-CM | POA: Diagnosis not present

## 2024-02-02 DIAGNOSIS — I639 Cerebral infarction, unspecified: Secondary | ICD-10-CM | POA: Diagnosis not present

## 2024-02-04 ENCOUNTER — Ambulatory Visit: Payer: Self-pay | Admitting: Student

## 2024-02-05 ENCOUNTER — Ambulatory Visit: Admitting: Physician Assistant

## 2024-02-07 ENCOUNTER — Ambulatory Visit: Admitting: Cardiology

## 2024-02-07 ENCOUNTER — Ambulatory Visit (INDEPENDENT_AMBULATORY_CARE_PROVIDER_SITE_OTHER): Admitting: Family Medicine

## 2024-02-07 ENCOUNTER — Encounter: Payer: Self-pay | Admitting: Family Medicine

## 2024-02-07 VITALS — BP 109/74 | HR 60 | Temp 98.0°F | Ht 65.0 in | Wt 138.1 lb

## 2024-02-07 DIAGNOSIS — K7031 Alcoholic cirrhosis of liver with ascites: Secondary | ICD-10-CM | POA: Diagnosis not present

## 2024-02-07 DIAGNOSIS — I10 Essential (primary) hypertension: Secondary | ICD-10-CM

## 2024-02-07 DIAGNOSIS — E1165 Type 2 diabetes mellitus with hyperglycemia: Secondary | ICD-10-CM

## 2024-02-07 DIAGNOSIS — Z794 Long term (current) use of insulin: Secondary | ICD-10-CM | POA: Diagnosis not present

## 2024-02-07 DIAGNOSIS — Z125 Encounter for screening for malignant neoplasm of prostate: Secondary | ICD-10-CM

## 2024-02-07 NOTE — Patient Instructions (Signed)
 Marland Kitchen  Please review the attached list of medications and notify my office if there are any errors.   . Please bring all of your medications to every appointment so we can make sure that our medication list is the same as yours.

## 2024-02-07 NOTE — Progress Notes (Signed)
 Established patient visit   Patient: [name]   DOB: 06/16/1951   73 y.o. Male  MRN: [id number] Visit Date: 02/07/2024  Today's healthcare provider: Nancyann Perry, MD   Chief Complaint  Patient presents with   Medical Management of Chronic Issues    Patient reports feeling okay, stomach has been upset. Has had some gas- taking gasx for this and seems to help.    Subjective    Discussed the use of AI scribe software for clinical note transcription with the patient, who gave verbal consent to proceed.  History of Present Illness   [name] is a 73 year old male with alcoholic liver cirrhosis and chronic ascites who presents for a follow-up visit.  He recently underwent both an endoscopy and a colonoscopy. He was told he has cirrhosis, but otherwise the results were normal according to his understanding. He understands that the results were otherwise normal, and they should be available in his chart.  He continues to experience fluid retention, noting a reduction in the amount of fluid accumulation compared to before. Previously, six and a half liters of fluid were removed, reducing his weight to 129 pounds. Currently, he weighs 133 pounds and estimates that about three pounds of this is fluid in his abdomen. He does not find the fluid accumulation uncomfortable at this time and is not considering further fluid removal yet.  He manages his diabetes with insulin , using Tresiba  at 10 to 12 units at night and Novolog  before meals, with doses ranging from six to ten units depending on his blood sugar levels. His blood sugars have been fairly stable, though he has experienced some low readings. He uses a finger-pricking device to check his blood sugar levels, but it is broken. He has a continuous glucose monitor that he has not yet used and plans to seek assistance from his daughter to set it up.  No significant discomfort from the fluid accumulation in his abdomen. His blood sugars  have been fairly stable, with occasional low readings.     Lab Results  Component Value Date   HGBA1C 5.7 (H) 10/28/2023   HGBA1C 6.6 (H) 08/02/2023   HGBA1C 6.6 (H) 03/13/2023   Last metabolic panel Lab Results  Component Value Date   GLUCOSE 234 (H) 01/01/2024   NA 132 (L) 01/01/2024   K 4.2 01/01/2024   CL 96 (L) 01/01/2024   CO2 28 01/01/2024   BUN 15 01/01/2024   CREATININE 0.74 01/01/2024   GFRNONAA >60 01/01/2024   CALCIUM 8.0 (L) 01/01/2024   PHOS 3.5 12/13/2023   PROT 5.7 (L) 01/01/2024   ALBUMIN  2.6 (L) 01/01/2024   LABGLOB 2.7 11/29/2023   AGRATIO 1.4 03/28/2022   BILITOT 0.8 01/01/2024   ALKPHOS 156 (H) 01/01/2024   AST 30 01/01/2024   ALT 13 01/01/2024   ANIONGAP 8 01/01/2024     Medications: Outpatient Medications Prior to Visit  Medication Sig   Accu-Chek FastClix Lancets MISC USE TO CHECK GLUCOSE BEFORE MEALS AND AS NEEDED   acetaminophen  (TYLENOL ) 500 MG tablet Take 1,000 mg by mouth every 6 (six) hours as needed.   allopurinol  (ZYLOPRIM ) 100 MG tablet TAKE 1 TABLET BY MOUTH EVERY DAY   aspirin  81 MG chewable tablet Chew 1 tablet (81 mg total) by mouth daily.   BD PEN NEEDLE MINI ULTRAFINE 31G X 5 MM MISC USE 1 TO CHECK BLOOD SUGAR 3 TIMES DAILY   carvedilol  (COREG ) 3.125 MG tablet TAKE 1  TABLET BY MOUTH 2 TIMES DAILY.   clobetasol (TEMOVATE) 0.05 % external solution Apply 1 Application topically 2 (two) times daily as needed.   Continuous Blood Gluc Receiver (FREESTYLE LIBRE 14 DAY READER) DEVI 1 Device by Does not apply route every 14 (fourteen) days. for type insulin  dependent type 2 diabetes   Continuous Glucose Sensor (FREESTYLE LIBRE 3 PLUS SENSOR) MISC Change sensor every 15 days.   eplerenone  (INSPRA ) 25 MG tablet TAKE 1 TABLET BY MOUTH TWICE A DAY   folic acid  (FOLVITE ) 1 MG tablet Take 1 tablet (1 mg total) by mouth daily.   furosemide  (LASIX ) 20 MG tablet TAKE 2 TABLETS BY MOUTH 2 TIMES DAILY.   insulin  aspart (NOVOLOG  FLEXPEN) 100 UNIT/ML  FlexPen 0-9 Units, Subcutaneous, 3 times daily with meals CBG < 70: Implement Hypoglycemia measures CBG 70 - 120: 0 units CBG 121 - 150: 1 unit CBG 151 - 200: 2 units CBG 201 - 250: 3 units CBG 251 - 300: 5 units CBG 301 - 350: 7 units CBG 351 - 400: 9 units CBG > 400: call MD   insulin  degludec (TRESIBA  FLEXTOUCH) 100 UNIT/ML FlexTouch Pen INJECT UP TO 20 UNITS UNDER THE SKIN EVERY DAY AT BEDTIME. NEED APPT FOR REFILLS. (Patient taking differently: 10-12 Units daily. INJECT UP TO 20 UNITS UNDER THE SKIN EVERY DAY AT BEDTIME. NEED APPT FOR REFILLS.)   Iron , Ferrous Sulfate , 325 (65 Fe) MG TABS Take 325 mg by mouth daily.   levETIRAcetam  (KEPPRA ) 750 MG tablet Take 2 tablets (1,500 mg total) by mouth 2 (two) times daily.   lisinopril  (ZESTRIL ) 5 MG tablet Take 5 mg by mouth daily.   magnesium  gluconate (MAGONATE) 500 MG tablet Take 500 mg by mouth daily.    niacin 500 MG tablet Take 500 mg by mouth at bedtime.   omeprazole  (PRILOSEC) 40 MG capsule TAKE 1 CAPSULE BY MOUTH EVERY DAY   ONETOUCH VERIO test strip USE TO CHECK SUGAR THREE TIMES DAILY FOR INSULIN  DEPENDENT TYPE 2 DIABETES   promethazine  (PHENERGAN ) 25 MG tablet Take 1 tablet (25 mg total) by mouth every 8 (eight) hours as needed.   pyridOXINE (VITAMIN B-6) 50 MG tablet Take 50 mg by mouth daily.   sertraline  (ZOLOFT ) 100 MG tablet TAKE 1 TABLET BY MOUTH EVERY DAY   spironolactone  (ALDACTONE ) 100 MG tablet Take 100 mg by mouth daily.   thiamine  (VITAMIN B-1) 100 MG tablet Take 1 tablet (100 mg total) by mouth daily.   traMADol  (ULTRAM ) 50 MG tablet TAKE 1 TABLET BY MOUTH EVERY 8 HOURS AS NEEDED   zinc gluconate 50 MG tablet Take 50 mg by mouth daily.   zinc sulfate, 50mg  elemental zinc, 220 (50 Zn) MG capsule Take 220 mg by mouth daily.   lidocaine  (LIDODERM ) 5 % Place 1 patch onto the skin daily. Remove & Discard patch within 12 hours or as directed by MD (Patient not taking: Reported on 02/07/2024)   sodium zirconium cyclosilicate  (LOKELMA )  5 g packet Take 5 g by mouth daily. (Patient not taking: Reported on 02/07/2024)   [DISCONTINUED] Multiple Vitamin (MULTIVITAMIN WITH MINERALS) TABS tablet Take 1 tablet by mouth daily. (Patient not taking: Reported on 01/27/2024)   [DISCONTINUED] NOVOLOG  FLEXPEN 100 UNIT/ML FlexPen INJECT 20 UNITS UP TO THREE TIMES A DAY BEFORE MEALS. (Patient taking differently: 5-20 Units 3 (three) times daily with meals. Inject 20 units up to three times a day before meals. SS)   Facility-Administered Medications Prior to Visit  Medication Dose Route  Frequency Provider   albumin  human 25 % solution 25 g  25 g Intravenous Once    Review of Systems  Constitutional:  Negative for appetite change, chills and fever.  Respiratory:  Negative for chest tightness, shortness of breath and wheezing.   Cardiovascular:  Negative for chest pain and palpitations.  Gastrointestinal:  Negative for abdominal pain, nausea and vomiting.       Objective    BP 109/74 (BP Location: Left Arm, Patient Position: Sitting, Cuff Size: Normal)   Pulse 60   Temp 98 F (36.7 C) (Oral)   Ht 5' 5 (1.651 m)   Wt 138 lb 1.6 oz (62.6 kg)   SpO2 98%   BMI 22.98 kg/m   Physical Exam    General: Appearance:    Well developed, well nourished male in no acute distress  Eyes:    PERRL, conjunctiva/corneas clear, EOM's intact       Lungs:     Clear to auscultation bilaterally, respirations unlabored  Heart:    Normal heart rate. Normal rhythm.  2/6 harsh, crescendo-decrescendo, systolic murmur at right upper sternal border   MS:   All extremities are intact.    Neurologic:   Awake, alert, oriented x 3. No apparent focal neurological defect.        Assessment & Plan    1. Type 2 diabetes mellitus with hyperglycemia, with long-term current use of insulin  (HCC) (Primary) Doing well on current regiment. Consider reducing basal insulin  if A1c is under 6.0 - Hemoglobin A1c  2. Alcoholic cirrhosis of liver with ascites (HCC) Stable  on current regiment. Reports abstinence from alcohol consumption. Now being followed by GI - Renal function panel  3. Primary hypertension Well controlled.  Continue current medications.    4. Prostate cancer screening  - PSA Total (Reflex To Free)  Return in about 4 months (around 06/08/2024).     Nancyann Perry, MD  [facility] [phone] (phone) [phone] (fax)  [facility]

## 2024-02-08 LAB — RENAL FUNCTION PANEL
Albumin: 3.4 g/dL — ABNORMAL LOW (ref 3.8–4.8)
BUN/Creatinine Ratio: 11 (ref 10–24)
BUN: 8 mg/dL (ref 8–27)
CO2: 23 mmol/L (ref 20–29)
Calcium: 8.5 mg/dL — ABNORMAL LOW (ref 8.6–10.2)
Chloride: 96 mmol/L (ref 96–106)
Creatinine, Ser: 0.75 mg/dL — ABNORMAL LOW (ref 0.76–1.27)
Glucose: 212 mg/dL — ABNORMAL HIGH (ref 70–99)
Phosphorus: 3.7 mg/dL (ref 2.8–4.1)
Potassium: 4 mmol/L (ref 3.5–5.2)
Sodium: 133 mmol/L — ABNORMAL LOW (ref 134–144)
eGFR: 95 mL/min/1.73 (ref >59)

## 2024-02-08 LAB — PSA TOTAL (REFLEX TO FREE): Prostate Specific Ag, Serum: 0.2 ng/mL (ref 0.0–4.0)

## 2024-02-08 LAB — HEMOGLOBIN A1C
Est. average glucose Bld gHb Est-mCnc: 146 mg/dL
Hgb A1c MFr Bld: 6.7 % — ABNORMAL HIGH (ref 4.8–5.6)

## 2024-02-09 ENCOUNTER — Ambulatory Visit: Payer: Self-pay | Admitting: Family Medicine

## 2024-02-22 ENCOUNTER — Other Ambulatory Visit: Payer: Self-pay | Admitting: Family Medicine

## 2024-02-22 DIAGNOSIS — K7031 Alcoholic cirrhosis of liver with ascites: Secondary | ICD-10-CM

## 2024-02-25 ENCOUNTER — Ambulatory Visit: Payer: PPO

## 2024-02-25 ENCOUNTER — Telehealth: Payer: Self-pay | Admitting: Family Medicine

## 2024-02-25 DIAGNOSIS — Z0000 Encounter for general adult medical examination without abnormal findings: Secondary | ICD-10-CM

## 2024-02-25 DIAGNOSIS — K4090 Unilateral inguinal hernia, without obstruction or gangrene, not specified as recurrent: Secondary | ICD-10-CM

## 2024-02-25 NOTE — Telephone Encounter (Signed)
 Copied from CRM [id number]. Topic: Referral - Request for Referral >> Feb 25, 2024  3:54 PM Wess RAMAN wrote: Did the patient discuss referral with their provider in the last year? Yes (If No - schedule appointment) (If Yes - send message)  Appointment offered? No  Type of order/referral and detailed reason for visit: Referral for hernia  Preference of office, provider, location: [place]  If referral order, have you been seen by this specialty before? No (If Yes, this issue or another issue? When? Where?  Can we respond through MyChart? Yes

## 2024-02-25 NOTE — Progress Notes (Signed)
 Subjective:   [name] is a 73 y.o. who presents for a Medicare Wellness preventive visit.  As a reminder, Annual Wellness Visits don't include a physical exam, and some assessments may be limited, especially if this visit is performed virtually. We may recommend an in-person follow-up visit with your provider if needed.  Visit Complete: Virtual I connected with  [name] on 02/25/24 by a audio enabled telemedicine application and verified that I am speaking with the correct person using two identifiers.  Patient Location: Home  Provider Location: Office/Clinic  I discussed the limitations of evaluation and management by telemedicine. The patient expressed understanding and agreed to proceed.  Vital Signs: Because this visit was a virtual/telehealth visit, some criteria may be missing or patient reported. Any vitals not documented were not able to be obtained and vitals that have been documented are patient reported.  VideoDeclined- This patient declined Librarian, academic. Therefore the visit was completed with audio only.  Persons Participating in Visit: Patient.  AWV Questionnaire: No: Patient Medicare AWV questionnaire was not completed prior to this visit.  Cardiac Risk Factors include: advanced age (>21men, >77 women);hypertension;diabetes mellitus;male gender     Objective:    Today's Vitals   02/25/24 1306  PainSc: 3    There is no height or weight on file to calculate BMI.     02/25/2024    1:15 PM 01/30/2024    7:19 AM 12/23/2023   11:42 AM 12/05/2023   12:14 PM 10/31/2023    6:37 PM 10/28/2023    3:11 PM 10/28/2023   11:01 AM  Advanced Directives  Does Patient Have a Medical Advance Directive? Yes Yes Yes Yes No Yes Unable to assess, patient is non-responsive or altered mental status  Type of Public librarian Power of [place];Living will Living will Healthcare Power of [place];Living will Healthcare Power of  [place];Living will  Healthcare Power of [place];Living will   Does patient want to make changes to medical advance directive? No - Patient declined   No - Patient declined No - Patient declined No - Patient declined   Copy of Healthcare Power of Attorney in Chart? Yes - validated most recent copy scanned in chart (See row information)   No - copy requested  No - copy requested   Would patient like information on creating a medical advance directive?     No - Patient declined      Current Medications (verified) Outpatient Encounter Medications as of 02/25/2024  Medication Sig   Accu-Chek FastClix Lancets MISC USE TO CHECK GLUCOSE BEFORE MEALS AND AS NEEDED   acetaminophen  (TYLENOL ) 500 MG tablet Take 1,000 mg by mouth every 6 (six) hours as needed.   allopurinol  (ZYLOPRIM ) 100 MG tablet TAKE 1 TABLET BY MOUTH EVERY DAY   aspirin  81 MG chewable tablet Chew 1 tablet (81 mg total) by mouth daily.   BD PEN NEEDLE MINI ULTRAFINE 31G X 5 MM MISC USE 1 TO CHECK BLOOD SUGAR 3 TIMES DAILY   carvedilol  (COREG ) 3.125 MG tablet TAKE 1 TABLET BY MOUTH 2 TIMES DAILY.   clobetasol (TEMOVATE) 0.05 % external solution Apply 1 Application topically 2 (two) times daily as needed.   Continuous Blood Gluc Receiver (FREESTYLE LIBRE 14 DAY READER) DEVI 1 Device by Does not apply route every 14 (fourteen) days. for type insulin  dependent type 2 diabetes   Continuous Glucose Sensor (FREESTYLE LIBRE 3 PLUS SENSOR) MISC Change sensor every 15 days.   eplerenone  (INSPRA )  25 MG tablet TAKE 1 TABLET BY MOUTH TWICE A DAY   folic acid  (FOLVITE ) 1 MG tablet Take 1 tablet (1 mg total) by mouth daily.   furosemide  (LASIX ) 20 MG tablet TAKE 2 TABLETS BY MOUTH 2 TIMES DAILY.   insulin  aspart (NOVOLOG  FLEXPEN) 100 UNIT/ML FlexPen 0-9 Units, Subcutaneous, 3 times daily with meals CBG < 70: Implement Hypoglycemia measures CBG 70 - 120: 0 units CBG 121 - 150: 1 unit CBG 151 - 200: 2 units CBG 201 - 250: 3 units CBG 251 - 300: 5 units  CBG 301 - 350: 7 units CBG 351 - 400: 9 units CBG > 400: call MD   insulin  degludec (TRESIBA  FLEXTOUCH) 100 UNIT/ML FlexTouch Pen INJECT UP TO 20 UNITS UNDER THE SKIN EVERY DAY AT BEDTIME. NEED APPT FOR REFILLS.   Iron , Ferrous Sulfate , 325 (65 Fe) MG TABS Take 325 mg by mouth daily.   levETIRAcetam  (KEPPRA ) 750 MG tablet Take 2 tablets (1,500 mg total) by mouth 2 (two) times daily.   lidocaine  (LIDODERM ) 5 % Place 1 patch onto the skin daily. Remove & Discard patch within 12 hours or as directed by MD   lisinopril  (ZESTRIL ) 5 MG tablet Take 5 mg by mouth daily.   omeprazole  (PRILOSEC) 40 MG capsule TAKE 1 CAPSULE BY MOUTH EVERY DAY   ONETOUCH VERIO test strip USE TO CHECK SUGAR THREE TIMES DAILY FOR INSULIN  DEPENDENT TYPE 2 DIABETES   promethazine  (PHENERGAN ) 25 MG tablet Take 1 tablet (25 mg total) by mouth every 8 (eight) hours as needed.   pyridOXINE (VITAMIN B-6) 50 MG tablet Take 50 mg by mouth daily.   sertraline  (ZOLOFT ) 100 MG tablet TAKE 1 TABLET BY MOUTH EVERY DAY   spironolactone  (ALDACTONE ) 100 MG tablet Take 100 mg by mouth daily.   thiamine  (VITAMIN B-1) 100 MG tablet Take 1 tablet (100 mg total) by mouth daily.   traMADol  (ULTRAM ) 50 MG tablet TAKE 1 TABLET BY MOUTH EVERY 8 HOURS AS NEEDED   magnesium  gluconate (MAGONATE) 500 MG tablet Take 500 mg by mouth daily.  (Patient not taking: Reported on 02/25/2024)   niacin 500 MG tablet Take 500 mg by mouth at bedtime. (Patient not taking: Reported on 02/25/2024)   sodium zirconium cyclosilicate  (LOKELMA ) 5 g packet Take 5 g by mouth daily. (Patient not taking: Reported on 02/25/2024)   zinc gluconate 50 MG tablet Take 50 mg by mouth daily. (Patient not taking: Reported on 02/25/2024)   zinc sulfate, 50mg  elemental zinc, 220 (50 Zn) MG capsule Take 220 mg by mouth daily. (Patient not taking: Reported on 02/25/2024)   Facility-Administered Encounter Medications as of 02/25/2024  Medication   albumin  human 25 % solution 25 g    Allergies  (verified) Hydrochlorothiazide and Hydrochlorothiazide   History: Past Medical History:  Diagnosis Date   Alcohol abuse    Alcoholism (HCC)    Anxiety    Aortic atherosclerosis (HCC)    Arthritis    Ascites    Aspiration pneumonia (HCC) 10/28/2023   Cancer (HCC)    Chronic pancreatitis (HCC)    Cirrhosis with alcoholism (HCC)    Colon polyp    COVID-19    DDD (degenerative disc disease), cervical    Depression    DM (diabetes mellitus) (HCC) 10/31/2023   DM (diabetes mellitus), type 2 (HCC)    Dyspnea    ED (erectile dysfunction)    Elevated liver enzymes    Esophageal varices (HCC)    Esophagitis  Gastric catarrh    Gastric varices    Gastritis    GERD (gastroesophageal reflux disease)    Gout    H. pylori infection    H/O gynecomastia    Hearing loss in right ear    Hernia, inguinal, left 03/20/2015   History of kidney stones    History of portal hypertension    HTN (hypertension) 10/31/2023   Hyperkalemia    Hyperlipidemia    Hypertension    Hyponatremia    IDA (iron  deficiency anemia)    Lipodystrophy    Obesity    Odynophagia    Pancreatitis    Refusal of blood transfusions as patient is Jehovah's Witness    Right inguinal hernia    Seizure (HCC)    as a child-age 48-none since-happened from fall as a child   Seizures (HCC)    Seizures (HCC) 12/01/2023   Severe sepsis (HCC) 10/28/2023   Splenic vein thrombosis    Splenomegaly    Ventral hernia    Vitamin D  deficiency    Past Surgical History:  Procedure Laterality Date   CHOLECYSTECTOMY  2006   COLONOSCOPY N/A 01/30/2024   Procedure: COLONOSCOPY;  Surgeon: Onita Elspeth Sharper, DO;  Location: [facility] ENDOSCOPY;  Service: Gastroenterology;  Laterality: N/A;  IDDM   COLONOSCOPY WITH ESOPHAGOGASTRODUODENOSCOPY (EGD)     COLONOSCOPY WITH PROPOFOL  N/A 11/21/2015   Procedure: COLONOSCOPY WITH PROPOFOL ;  Surgeon: Lamar ONEIDA Holmes, MD;  Location: [facility] ENDOSCOPY;  Service: Endoscopy;  Laterality: N/A;    ESOPHAGOGASTRODUODENOSCOPY N/A 01/30/2024   Procedure: EGD (ESOPHAGOGASTRODUODENOSCOPY);  Surgeon: Onita Elspeth Sharper, DO;  Location: [facility] ENDOSCOPY;  Service: Gastroenterology;  Laterality: N/A;   ESOPHAGOGASTRODUODENOSCOPY (EGD) WITH PROPOFOL  N/A 08/21/2019   Procedure: ESOPHAGOGASTRODUODENOSCOPY (EGD) WITH PROPOFOL ;  Surgeon: Jinny Carmine, MD;  Location: [facility] ENDOSCOPY;  Service: Endoscopy;  Laterality: N/A;   HERNIA REPAIR     Inguinal Hernia Repai. Left: 07/28/2013 Dr. Eluterio; 2nd repair done 04/30/2014   INSERTION OF MESH N/A 04/10/2023   Procedure: INSERTION OF MESH;  Surgeon: Lane Shope, MD;  Location: [facility] ORS;  Service: General;  Laterality: N/A;   POLYPECTOMY  01/30/2024   Procedure: POLYPECTOMY, INTESTINE;  Surgeon: Onita Elspeth Sharper, DO;  Location: [facility] ENDOSCOPY;  Service: Gastroenterology;;   TONSILLECTOMY     XI ROBOTIC ASSISTED VENTRAL HERNIA N/A 04/10/2023   Procedure: XI ROBOTIC ASSISTED VENTRAL HERNIA;  Surgeon: Lane Shope, MD;  Location: [facility] ORS;  Service: General;  Laterality: N/A;   Family History  Problem Relation Age of Onset   Colon polyps Father    Lung cancer Father    Hypertension Father    Dementia Mother    Hypertension Brother    Heart Problems Brother    Hypertension Brother    Diabetes Brother        type 2   Congestive Heart Failure Brother    Social History   Socioeconomic History   Marital status: Married    Spouse name: [name]   Number of children: 3   Years of education: Not on file   Highest education level: Some college, no degree  Occupational History   Occupation: retired  Tobacco Use   Smoking status: Former    Types: Cigarettes   Smokeless tobacco: Never  Vaping Use   Vaping status: Never Used  Substance and Sexual Activity   Alcohol use: Not Currently    Comment: drank few weeks ago   Drug use: Never   Sexual activity: Yes  Other Topics Concern   Not  on file  Social History Narrative   ** Merged History  Encounter **       Social Drivers of Health   Financial Resource Strain: Low Risk  (02/25/2024)   Overall Financial Resource Strain (CARDIA)    Difficulty of Paying Living Expenses: Not hard at all  Food Insecurity: No Food Insecurity (02/25/2024)   Hunger Vital Sign    Worried About Running Out of Food in the Last Year: Never true    Ran Out of Food in the Last Year: Never true  Transportation Needs: No Transportation Needs (02/25/2024)   PRAPARE - Administrator, Civil Service (Medical): No    Lack of Transportation (Non-Medical): No  Physical Activity: Insufficiently Active (02/25/2024)   Exercise Vital Sign    Days of Exercise per Week: 7 days    Minutes of Exercise per Session: 20 min  Stress: No Stress Concern Present (02/25/2024)   [employer] of Occupational Health - Occupational Stress Questionnaire    Feeling of Stress: Not at all  Social Connections: Moderately Isolated (02/25/2024)   Social Connection and Isolation Panel    Frequency of Communication with Friends and Family: More than three times a week    Frequency of Social Gatherings with Friends and Family: More than three times a week    Attends Religious Services: Never    Database administrator or Organizations: No    Attends Engineer, structural: Never    Marital Status: Married    Tobacco Counseling Counseling given: Not Answered    Clinical Intake:  Pre-visit preparation completed: Yes  Pain : 0-10 Pain Score: 3  Pain Type: Chronic pain Pain Location: Back Pain Orientation: Lower Pain Descriptors / Indicators: Aching, Discomfort, Constant Pain Onset: More than a month ago Pain Frequency: Constant Pain Relieving Factors: heating pad  Pain Relieving Factors: heating pad  Nutritional Risks: None Diabetes: Yes CBG done?: No Did pt. bring in CBG monitor from home?: No  Lab Results  Component Value Date   HGBA1C 6.7 (H) 02/07/2024   HGBA1C 5.7 (H) 10/28/2023   HGBA1C  6.6 (H) 08/02/2023     How often do you need to have someone help you when you read instructions, pamphlets, or other written materials from your doctor or pharmacy?: 1 - Never  Interpreter Needed?: No  Information entered by :: James DAS, [name]   Activities of Daily Living    02/25/2024    1:18 PM 02/23/2024   11:16 AM  In your present state of health, do you have any difficulty performing the following activities:  Hearing? 0 0  Vision? 0 0  Difficulty concentrating or making decisions? 0 0  Walking or climbing stairs? 1 1  Dressing or bathing? 0 0  Doing errands, shopping? 0 0  Preparing Food and eating ? N N  Using the Toilet? N N  In the past six months, have you accidently leaked urine? Y Y  Do you have problems with loss of bowel control? N N  Managing your Medications? N N  Managing your Finances? N N  Housekeeping or managing your Housekeeping? N N    Patient Care Team: Gasper Nancyann BRAVO, MD as PCP - General (Family Medicine) Jinny Carmine, MD as Consulting Physician (Gastroenterology) Malvina Eck, DC as Referring Physician (Chiropractic Medicine) Pcp, No Pa, [facility] (Optometry) Melanee Annah BROCKS, MD as Consulting Physician (Oncology) Gasper Nancyann BRAVO, MD (Family Medicine)  I have updated your Care Teams any recent Medical  Services you may have received from other providers in the past year.     Assessment:   This is a routine wellness examination for [name].  Hearing/Vision screen Hearing Screening - Comments:: NO AIDS Vision Screening - Comments:: WEARS GLASSES ALL DAY- [facility]- HAS APPT COMING UP    Goals Addressed             This Visit's Progress    DIET - EAT MORE FRUITS AND VEGETABLES         Depression Screen     02/25/2024    1:13 PM 02/07/2024    1:25 PM 11/29/2023    2:10 PM 05/13/2023    1:31 PM 04/19/2023    1:15 PM 03/19/2023    1:42 PM 02/25/2023   11:41 AM  PHQ 2/9 Scores  PHQ - 2 Score 0 0 0  0 0 0  PHQ- 9 Score 0  1 2  1     Exception Documentation    Patient refusal       Fall Risk     02/23/2024   11:16 AM 02/07/2024    1:24 PM 04/19/2023    1:15 PM 02/25/2023   11:40 AM 02/20/2023    1:15 PM  Fall Risk   Falls in the past year? 1 1 1 1 1   Number falls in past yr: 1 1 1 1  0  Injury with Fall? 0 0 1 1 1   Risk for fall due to : History of fall(s);Impaired mobility;Impaired balance/gait Impaired balance/gait  History of fall(s) Impaired balance/gait  Follow up Falls evaluation completed;Falls prevention discussed Falls evaluation completed  Falls evaluation completed Falls prevention discussed;Education provided    MEDICARE RISK AT HOME:  Medicare Risk at Home Any stairs in or around the home?: Yes If so, are there any without handrails?: No Home free of loose throw rugs in walkways, pet beds, electrical cords, etc?: Yes Adequate lighting in your home to reduce risk of falls?: Yes Life alert?: No Use of a cane, walker or w/c?: Yes (CANE SOMETIMES) Grab bars in the bathroom?: Yes Shower chair or bench in shower?: Yes Elevated toilet seat or a handicapped toilet?: Yes  TIMED UP AND GO:  Was the test performed?  No  Cognitive Function: 6CIT completed        02/25/2024    1:20 PM 05/10/2023    2:04 PM 02/20/2023    1:25 PM 05/18/2020    3:06 PM  6CIT Screen  What Year? 0 points 0 points 0 points 0 points  What month? 0 points 0 points 0 points 0 points  What time? 0 points 0 points 0 points 0 points  Count back from 20 0 points 0 points 0 points 0 points  Months in reverse 0 points 0 points 0 points 0 points  Repeat phrase 0 points 0 points 0 points 0 points  Total Score 0 points 0 points 0 points 0 points    Immunizations Immunization History  Administered Date(s) Administered    sv, Bivalent, Protein Subunit Rsvpref,pf (Abrysvo) 06/03/2023   Fluad Quad(high Dose 65+) 03/12/2022   Hep A / Hep B 03/28/2022, 12/14/2022   Influenza Split 05/12/2007, 05/24/2009, 06/05/2011,  05/12/2012   Influenza, High Dose Seasonal PF 04/03/2017, 04/14/2018, 04/03/2021, 03/06/2023   Influenza,inj,Quad PF,6+ Mos 05/26/2013, 05/20/2014, 04/11/2015   Influenza-Unspecified 03/09/2017, 05/01/2019, 04/22/2020   Moderna Sars-Covid-2 Vaccination 10/26/2019, 11/23/2019, 05/25/2020, 10/06/2020   Pfizer(Comirnaty)Fall Seasonal Vaccine 12 years and older 03/28/2022   Pneumococcal Conjugate-13 10/02/2016, 04/14/2018  Pneumococcal Polysaccharide-23 08/06/2011, 10/03/2017   Td 07/09/2006   Tdap 06/05/2011, 09/15/2020   Zoster Recombinant(Shingrix ) 09/15/2020, 08/23/2021   Zoster, Live 01/04/2011    Screening Tests Health Maintenance  Topic Date Due   COVID-19 Vaccine (6 - 2024-25 season) 03/10/2023   OPHTHALMOLOGY EXAM  11/07/2023   INFLUENZA VACCINE  02/07/2024   HEMOGLOBIN A1C  08/09/2024   Diabetic kidney evaluation - Urine ACR  12/12/2024   Diabetic kidney evaluation - eGFR measurement  02/06/2025   Medicare Annual Wellness (AWV)  02/24/2025   DTaP/Tdap/Td (4 - Td or Tdap) 09/16/2030   Colonoscopy  01/30/2031   Pneumococcal Vaccine: 50+ Years  Completed   Hepatitis C Screening  Completed   Zoster Vaccines- Shingrix   Completed   HPV VACCINES  Aged Out   Meningococcal B Vaccine  Aged Out   Pneumococcal Vaccine  Discontinued   Hepatitis B Vaccines 19-59 Average Risk  Discontinued    Health Maintenance  Health Maintenance Due  Topic Date Due   COVID-19 Vaccine (6 - 2024-25 season) 03/10/2023   OPHTHALMOLOGY EXAM  11/07/2023   INFLUENZA VACCINE  02/07/2024   Health Maintenance Items Addressed: UP TO DATE ON COLONOSCOPY; NEEDS PNA, COVID & FLU;UP TO DATE ON TDAP & SHINGRIX   Additional Screening:  Vision Screening: Recommended annual ophthalmology exams for early detection of glaucoma and other disorders of the eye. Would you like a referral to an eye doctor? No    Dental Screening: Recommended annual dental exams for proper oral hygiene  Community Resource Referral  / Chronic Care Management: CRR required this visit?  No   CCM required this visit?  No   Plan:    I have personally reviewed and noted the following in the patient's chart:   Medical and social history Use of alcohol, tobacco or illicit drugs  Current medications and supplements including opioid prescriptions. Patient is not currently taking opioid prescriptions. Functional ability and status Nutritional status Physical activity Advanced directives List of other physicians Hospitalizations, surgeries, and ER visits in previous 12 months Vitals Screenings to include cognitive, depression, and falls Referrals and appointments  In addition, I have reviewed and discussed with patient certain preventive protocols, quality metrics, and best practice recommendations. A written personalized care plan for preventive services as well as general preventive health recommendations were provided to patient.   James GORMAN Das, [name]   [birth year]   After Visit Summary: (MyChart) Due to this being a telephonic visit, the after visit summary with patients personalized plan was offered to patient via MyChart   Notes: Nothing significant to report at this time.

## 2024-02-25 NOTE — Patient Instructions (Addendum)
 [name] , Thank you for taking time out of your busy schedule to complete your Annual Wellness Visit with me. I enjoyed our conversation and look forward to speaking with you again next year. I, as well as your care team,  appreciate your ongoing commitment to your health goals. Please review the following plan we discussed and let me know if I can assist you in the future.   Follow up Visits: 03/02/25 @ 12:30 PM BY PHONE We will see or speak with you next year for your Next Medicare AWV with our clinical staff Have you seen your provider in the last 6 months (3 months if uncontrolled diabetes)? Yes  Clinician Recommendations:  Aim for 30 minutes of exercise or brisk walking, 6-8 glasses of water, and 5 servings of fruits and vegetables each day. TAKE CARE!      This is a list of the screenings recommended for you:  Health Maintenance  Topic Date Due   COVID-19 Vaccine (6 - 2024-25 season) 03/10/2023   Eye exam for diabetics  11/07/2023   Flu Shot  02/07/2024   Hemoglobin A1C  08/09/2024   Yearly kidney health urinalysis for diabetes  12/12/2024   Yearly kidney function blood test for diabetes  02/06/2025   Medicare Annual Wellness Visit  02/24/2025   DTaP/Tdap/Td vaccine (4 - Td or Tdap) 09/16/2030   Colon Cancer Screening  01/30/2031   Pneumococcal Vaccine for age over 33  Completed   Hepatitis C Screening  Completed   Zoster (Shingles) Vaccine  Completed   HPV Vaccine  Aged Out   Meningitis B Vaccine  Aged Out   Pneumococcal Vaccine  Discontinued   Hepatitis B Vaccine  Discontinued    Advanced directives: (In Chart) A copy of your advanced directives are scanned into your chart should your provider ever need it. Advance Care Planning is important because it:  [x]  Makes sure you receive the medical care that is consistent with your values, goals, and preferences  [x]  It provides guidance to your family and loved ones and reduces their decisional burden about whether or not they  are making the right decisions based on your wishes.  Follow the link provided in your after visit summary or read over the paperwork we have mailed to you to help you started getting your Advance Directives in place. If you need assistance in completing these, please reach out to us  so that we can help you!

## 2024-02-26 ENCOUNTER — Other Ambulatory Visit: Payer: Self-pay | Admitting: Family Medicine

## 2024-02-26 MED ORDER — LIDOCAINE 5 % EX PTCH: 1.0000 | MEDICATED_PATCH | CUTANEOUS | 5 refills | Status: AC

## 2024-02-26 NOTE — Progress Notes (Signed)
 Gwenn Jhonnie RAMAN, LPN  Gasper Nancyann BRAVO, MD Hi Dr.Ramia Sidney, this gentleman was wondering if you could prescribe the Lidocaine  5% patches for him? Thanks, [employer]

## 2024-02-27 ENCOUNTER — Telehealth: Payer: Self-pay

## 2024-02-27 NOTE — Telephone Encounter (Signed)
 Copied from CRM #[id number]. Topic: Clinical - Medical Advice >> Feb 27, 2024  2:44 PM Antwanette L wrote: Reason for CRM: Patient is having some discomfort w/ a hernia on his right side. According to the patient, it feels like his skin is crawling and it hurts sometimes whenever he moves forward. Patient is requesting a callback at [phone]

## 2024-02-28 ENCOUNTER — Telehealth: Payer: Self-pay

## 2024-02-28 DIAGNOSIS — K4090 Unilateral inguinal hernia, without obstruction or gangrene, not specified as recurrent: Secondary | ICD-10-CM

## 2024-02-28 LAB — ACID FAST CULTURE WITH REFLEXED SENSITIVITIES (MYCOBACTERIA): Acid Fast Culture: NEGATIVE

## 2024-02-28 NOTE — Telephone Encounter (Signed)
 Copied from CRM [id number]. Topic: Referral - Request for Referral >> Feb 28, 2024  3:58 PM Charlet HERO wrote: Did the patient discuss referral with their provider in the last year? Yes (If No - schedule appointment) (If Yes - send message)  Appointment offered? No  Type of order/referral and detailed reason for visit: For Hernia removal  Preference of office, provider, location: N/A  If referral order, have you been seen by this specialty before? No   Can we respond through MyChart? No Patient would like to be called back when the referral is sent. Patient is calling back about the hernia that he was told to go to urgent care by [facility] for it and he is stating that they do not do anything for hernias would i send him back to [facility] he wants Dr to give him a referral to get it removed

## 2024-02-28 NOTE — Telephone Encounter (Signed)
 Referral was already placed. He should be getting a call to schedule appointment in the next few days.

## 2024-03-02 ENCOUNTER — Other Ambulatory Visit (HOSPITAL_COMMUNITY): Payer: Self-pay

## 2024-03-02 ENCOUNTER — Telehealth: Payer: Self-pay | Admitting: Family Medicine

## 2024-03-02 DIAGNOSIS — K7031 Alcoholic cirrhosis of liver with ascites: Secondary | ICD-10-CM

## 2024-03-02 NOTE — Telephone Encounter (Signed)
 Advised

## 2024-03-03 NOTE — Telephone Encounter (Signed)
 Please advise patient, it looks like Dr. Onita started him on spironolactone . You're not supposed to take eplerenone  AND spironolactone . He needs to straighten this out when he sees Dr. Onita next week.

## 2024-03-03 NOTE — Telephone Encounter (Signed)
 Patient advised. Verbalized understanding

## 2024-03-04 NOTE — Progress Notes (Unsigned)
 Patient ID: [name], male   DOB: 03/30/1951, 73 y.o.   MRN: [id number]  Chief Complaint: Follow-up ventral hernia repair.  History of Present Illness [name] is a 73 y.o. male with history of robotic assisted repair of umbilical fascial defect of 2 cm with IPOM plus technique, completed April 10, 2023.    Prior left-sided inguinal hernia repairs x 2 in 2015.  He reports he was unaware of the inguinal hernia until the last 6 months, where it has progressively increased in size and discomfort.  He reports it reduces some.  He denies any abdominal distention, obstructive symptoms, or constipated issues.  He denies any recent ascites, or worsening of his liver disease.  Past Medical History Past Medical History:  Diagnosis Date   Alcohol abuse    Alcoholism (HCC)    Anxiety    Aortic atherosclerosis (HCC)    Arthritis    Ascites    Aspiration pneumonia (HCC) 10/28/2023   Cancer (HCC)    Chronic pancreatitis (HCC)    Cirrhosis with alcoholism (HCC)    Colon polyp    COVID-19    DDD (degenerative disc disease), cervical    Depression    DM (diabetes mellitus) (HCC) 10/31/2023   DM (diabetes mellitus), type 2 (HCC)    Dyspnea    ED (erectile dysfunction)    Elevated liver enzymes    Esophageal varices (HCC)    Esophagitis    Gastric catarrh    Gastric varices    Gastritis    GERD (gastroesophageal reflux disease)    Gout    H. pylori infection    H/O gynecomastia    Hearing loss in right ear    Hernia, inguinal, left 03/20/2015   History of kidney stones    History of portal hypertension    HTN (hypertension) 10/31/2023   Hyperkalemia    Hyperlipidemia    Hypertension    Hyponatremia    IDA (iron  deficiency anemia)    Lipodystrophy    Obesity    Odynophagia    Pancreatitis    Refusal of blood transfusions as patient is Jehovah's Witness    Right inguinal hernia    Seizure (HCC)    as a child-age 30-none since-happened from fall as a child   Seizures (HCC)     Seizures (HCC) 12/01/2023   Severe sepsis (HCC) 10/28/2023   Splenic vein thrombosis    Splenomegaly    Ventral hernia    Vitamin D  deficiency       Past Surgical History:  Procedure Laterality Date   CHOLECYSTECTOMY  07/09/2004   COLONOSCOPY N/A 01/30/2024   Procedure: COLONOSCOPY;  Surgeon: Onita Elspeth Sharper, DO;  Location: [facility] ENDOSCOPY;  Service: Gastroenterology;  Laterality: N/A;  IDDM   COLONOSCOPY WITH ESOPHAGOGASTRODUODENOSCOPY (EGD)     COLONOSCOPY WITH PROPOFOL  N/A 11/21/2015   Procedure: COLONOSCOPY WITH PROPOFOL ;  Surgeon: Lamar ONEIDA Holmes, MD;  Location: [facility] ENDOSCOPY;  Service: Endoscopy;  Laterality: N/A;   ESOPHAGOGASTRODUODENOSCOPY N/A 01/30/2024   Procedure: EGD (ESOPHAGOGASTRODUODENOSCOPY);  Surgeon: Onita Elspeth Sharper, DO;  Location: [facility] ENDOSCOPY;  Service: Gastroenterology;  Laterality: N/A;   ESOPHAGOGASTRODUODENOSCOPY (EGD) WITH PROPOFOL  N/A 08/21/2019   Procedure: ESOPHAGOGASTRODUODENOSCOPY (EGD) WITH PROPOFOL ;  Surgeon: Jinny Carmine, MD;  Location: [facility] ENDOSCOPY;  Service: Endoscopy;  Laterality: N/A;   HERNIA REPAIR  07/28/2013   Inguinal Hernia Repai. Left: 07/28/2013 Dr. Eluterio; 2nd repair done 04/30/2014   INSERTION OF MESH N/A 04/10/2023   Procedure: INSERTION OF MESH;  Surgeon: Lane,  Honor, MD;  Location: [facility] ORS;  Service: General;  Laterality: N/A;   POLYPECTOMY  01/30/2024   Procedure: POLYPECTOMY, INTESTINE;  Surgeon: Onita Elspeth Sharper, DO;  Location: [facility] ENDOSCOPY;  Service: Gastroenterology;;   TONSILLECTOMY     XI ROBOTIC ASSISTED VENTRAL HERNIA N/A 04/10/2023   Procedure: XI ROBOTIC ASSISTED VENTRAL HERNIA;  Surgeon: Lane Honor, MD;  Location: [facility] ORS;  Service: General;  Laterality: N/A;    Allergies  Allergen Reactions   Hydrochlorothiazide Other (See Comments)    Reaction:  Hyponatremia   Hydrochlorothiazide Other (See Comments)    Hyponatremia - High sodium in the blood    Current Outpatient Medications   Medication Sig Dispense Refill   Accu-Chek FastClix Lancets MISC USE TO CHECK GLUCOSE BEFORE MEALS AND AS NEEDED 102 each 12   acetaminophen  (TYLENOL ) 500 MG tablet Take 1,000 mg by mouth every 6 (six) hours as needed.     allopurinol  (ZYLOPRIM ) 100 MG tablet TAKE 1 TABLET BY MOUTH EVERY DAY 90 tablet 4   aspirin  81 MG chewable tablet Chew 1 tablet (81 mg total) by mouth daily. 30 tablet 1   BD PEN NEEDLE MINI ULTRAFINE 31G X 5 MM MISC USE 1 TO CHECK BLOOD SUGAR 3 TIMES DAILY 100 each 4   carvedilol  (COREG ) 3.125 MG tablet TAKE 1 TABLET BY MOUTH 2 TIMES DAILY. 180 tablet 0   Continuous Blood Gluc Receiver (FREESTYLE LIBRE 14 DAY READER) DEVI 1 Device by Does not apply route every 14 (fourteen) days. for type insulin  dependent type 2 diabetes 1 each 12   Continuous Glucose Sensor (FREESTYLE LIBRE 3 PLUS SENSOR) MISC Change sensor every 15 days. 2 each 11   eplerenone  (INSPRA ) 25 MG tablet TAKE 1 TABLET BY MOUTH TWICE A DAY 60 tablet 5   folic acid  (FOLVITE ) 1 MG tablet Take 1 tablet (1 mg total) by mouth daily.     furosemide  (LASIX ) 20 MG tablet TAKE 2 TABLETS BY MOUTH 2 TIMES DAILY. 360 tablet 3   insulin  aspart (NOVOLOG  FLEXPEN) 100 UNIT/ML FlexPen 0-9 Units, Subcutaneous, 3 times daily with meals CBG < 70: Implement Hypoglycemia measures CBG 70 - 120: 0 units CBG 121 - 150: 1 unit CBG 151 - 200: 2 units CBG 201 - 250: 3 units CBG 251 - 300: 5 units CBG 301 - 350: 7 units CBG 351 - 400: 9 units CBG > 400: call MD     insulin  degludec (TRESIBA  FLEXTOUCH) 100 UNIT/ML FlexTouch Pen INJECT UP TO 20 UNITS UNDER THE SKIN EVERY DAY AT BEDTIME. NEED APPT FOR REFILLS. 15 mL 2   Iron , Ferrous Sulfate , 325 (65 Fe) MG TABS Take 325 mg by mouth daily.     levETIRAcetam  (KEPPRA ) 750 MG tablet Take 2 tablets (1,500 mg total) by mouth 2 (two) times daily. 120 tablet 11   lidocaine  (LIDODERM ) 5 % Place 1 patch onto the skin daily. Remove & Discard patch within 12 hours or as directed by MD 30 patch 5    lisinopril  (ZESTRIL ) 5 MG tablet Take 5 mg by mouth daily.     omeprazole  (PRILOSEC) 40 MG capsule TAKE 1 CAPSULE BY MOUTH EVERY DAY 90 capsule 3   ONETOUCH VERIO test strip USE TO CHECK SUGAR THREE TIMES DAILY FOR INSULIN  DEPENDENT TYPE 2 DIABETES 100 strip 4   promethazine  (PHENERGAN ) 25 MG tablet Take 1 tablet (25 mg total) by mouth every 8 (eight) hours as needed. 20 tablet 3   pyridOXINE (VITAMIN B-6) 50 MG tablet Take 50  mg by mouth daily.     sertraline  (ZOLOFT ) 100 MG tablet TAKE 1 TABLET BY MOUTH EVERY DAY 90 tablet 4   spironolactone  (ALDACTONE ) 100 MG tablet Take 100 mg by mouth daily.     thiamine  (VITAMIN B-1) 100 MG tablet Take 1 tablet (100 mg total) by mouth daily.     traMADol  (ULTRAM ) 50 MG tablet TAKE 1 TABLET BY MOUTH EVERY 8 HOURS AS NEEDED 15 tablet 3   Current Facility-Administered Medications  Medication Dose Route Frequency Provider Last Rate Last Admin   albumin  human 25 % solution 25 g  25 g Intravenous Once         Family History Family History  Problem Relation Age of Onset   Colon polyps Father    Lung cancer Father    Hypertension Father    Dementia Mother    Hypertension Brother    Heart Problems Brother    Hypertension Brother    Diabetes Brother        type 2   Congestive Heart Failure Brother       Social History Social History   Tobacco Use   Smoking status: Former    Types: Cigarettes    Passive exposure: Past   Smokeless tobacco: Never  Vaping Use   Vaping status: Never Used  Substance Use Topics   Alcohol use: Not Currently    Comment: drank few weeks ago   Drug use: Never        Review of Systems  All other systems reviewed and are negative.    Physical Exam Blood pressure 110/73, pulse (!) 51, height 5' 5 (1.651 m), weight 136 lb (61.7 kg), SpO2 100%. Last Weight  Most recent update: 03/05/2024 11:05 AM    Weight  61.7 kg (136 lb)             CONSTITUTIONAL: Well developed, thin, elderly male, appropriately  responsive and aware without distress.   EYES: Sclera non-icteric.   EARS, NOSE, MOUTH AND THROAT: The oropharynx is clear. Oral mucosa is pink and moist.    Hearing is intact to voice.  NECK: Trachea is midline, and there is no jugular venous distension.  LYMPH NODES:  Lymph nodes in the neck are not appreciated. RESPIRATORY:  Lungs are clear, and breath sounds are equal bilaterally.  Normal respiratory effort without pathologic use of accessory muscles. CARDIOVASCULAR: Heart is regular in rate and rhythm.  Well perfused.  GI: The abdomen is  soft, nontender, but firm with mild distention. There were no palpable masses.  I did not appreciate hepatosplenomegaly.  GU: Large right inguinal hernia, readily reducible.  MUSCULOSKELETAL:  Symmetrical muscle tone appreciated in all four extremities.    SKIN: Skin turgor is normal. No pathologic skin lesions appreciated.  NEUROLOGIC:  Motor and sensation appear grossly normal.  Cranial nerves are grossly without defect. PSYCH:  Alert and oriented to person, place and time. Affect is appropriate for situation.  Data Reviewed I have personally reviewed what is currently available of the patient's imaging, recent labs and medical records.   Labs:     Latest Ref Rng & Units 01/01/2024   12:09 PM 12/23/2023   11:48 AM 12/05/2023   12:17 PM  CBC  WBC 4.0 - 10.5 K/uL 5.0  4.6  6.8   Hemoglobin 13.0 - 17.0 g/dL 9.0  8.8  9.0   Hematocrit 39.0 - 52.0 % 29.0  28.1  27.4   Platelets 150 - 400 K/uL 225  205  306       Latest Ref Rng & Units 02/07/2024    2:13 PM 01/01/2024   12:09 PM 12/23/2023   11:48 AM  CMP  Glucose 70 - 99 mg/dL 787  765  857   BUN 8 - 27 mg/dL 8  15  10    Creatinine 0.76 - 1.27 mg/dL 9.24  9.25  9.31   Sodium 134 - 144 mmol/L 133  132  135   Potassium 3.5 - 5.2 mmol/L 4.0  4.2  3.5   Chloride 96 - 106 mmol/L 96  96  98   CO2 20 - 29 mmol/L 23  28  28    Calcium 8.6 - 10.2 mg/dL 8.5  8.0  8.4   Total Protein 6.5 - 8.1 g/dL  5.7     Total Bilirubin 0.0 - 1.2 mg/dL  0.8    Alkaline Phos 38 - 126 U/L  156    AST 15 - 41 U/L  30    ALT 0 - 44 U/L  13      Imaging: Radiological images personally reviewed:  CLINICAL DATA:  cirrhosis   EXAM: ULTRASOUND ABDOMEN LIMITED RIGHT UPPER QUADRANT   COMPARISON:  02/21/2021.   FINDINGS: Gallbladder:   Status post cholecystectomy.   Common bile duct:   Diameter: 0.1 cm.   Liver:   Cirrhotic appearance of the liver which is small and nodular. Liver is hyperechoic consistent with fatty infiltration. No focal hepatic lesions identified. No intrahepatic ductal dilatation.   Large amount of ascites noted.   IMPRESSION: Cirrhotic liver with fatty infiltration. Ascites.     Electronically Signed   By: Fonda Field M.D.   On: 02/06/2024 23:42 Within last 24 hrs: No results found.  Assessment    Right inguinal hernia, with significant development over the last 6 months, likely exacerbated from worsening ascites?  Significant ascites as noted on last ultrasound. Patient Active Problem List   Diagnosis Date Noted   Mild aortic stenosis 01/21/2024   Seizures (HCC) 12/01/2023   Anemia 10/31/2023   Acute encephalopathy 10/31/2023   History of cerebrovascular accident (CVA) in adulthood 10/31/2023   Alcoholic cirrhosis of liver with ascites (HCC) 10/28/2023   Iron  deficiency anemia 07/02/2023   Ventral hernia without obstruction or gangrene 03/13/2023   Incisional hernia, without obstruction or gangrene 10/23/2022   Alcohol dependence in remission (HCC) 08/15/2021   Closed fracture of heel bone 05/11/2021   Primary localized osteoarthrosis of ankle and foot 05/11/2021   Gynecomastia, male 02/19/2020   Gastric varices    Hyperuricemia 10/02/2016   Dermatitis, eczematoid 03/20/2015   GERD (gastroesophageal reflux disease) 03/20/2015   History of Helicobacter pylori infection 03/20/2015   Hearing loss of right ear 03/20/2015   Strain of supraspinatus muscle  or tendon 03/20/2015   Cirrhosis (HCC) 03/20/2015   Splenic vein thrombosis    Portal hypertension (HCC)    Blood clotting disorder (HCC) 02/11/2014   Abnormal liver enzymes 12/08/2013   Esophagitis 12/08/2013   H/O adenomatous polyp of colon 09/23/2012   Esophageal varices (HCC) 09/09/2012   DDD (degenerative disc disease), cervical 08/17/2011   Vitamin D  deficiency 08/19/2009   Type 2 diabetes mellitus with hyperglycemia, with long-term current use of insulin  (HCC) 10/01/2007   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   Depression 05/13/2007   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   Obesity 07/09/2005   Hypertension 07/09/1998    Plan    Control of ascites.  Has  follow-up with Dr. Onita next week.  Scheduling elective robotic right inguinal hernia repair for September 10.  Will be glad to defer elective surgery should there be opportunity to improve the management of his ascites and his ascites preoperatively.    I discussed possibility of incarceration, strangulation, enlargement in size over time, and the need for emergency surgery in the face of these.  Also reviewed the techniques of reduction should incarceration occur, and when unsuccessful to present to the ED.  Also discussed that surgery risks include recurrence which can be up to 30% in the case of complex hernias, use of prosthetic materials (mesh) and the increased risk of infection and the possible need for re-operation and removal of mesh, possibility of post-op SBO or ileus, and the risks of general anesthetic including heart attack, stroke, sudden death or some reaction to anesthetic medications. The patient, and those present, appear to understand the risks, any and all questions were answered to the patient's satisfaction.  No guarantees were ever expressed or implied.     I personally spent a total of 45 minutes in the care of the patient today including preparing to see the  patient, getting/reviewing separately obtained history, performing a medically appropriate exam/evaluation, placing orders, referring and communicating with other health care professionals, and documenting clinical information in the EHR.   These notes generated with voice recognition software. I apologize for typographical errors.  Honor Leghorn M.D., FACS 03/05/2024, 11:35 AM

## 2024-03-05 ENCOUNTER — Encounter: Payer: Self-pay | Admitting: Surgery

## 2024-03-05 ENCOUNTER — Other Ambulatory Visit: Payer: Self-pay | Admitting: Gastroenterology

## 2024-03-05 ENCOUNTER — Ambulatory Visit: Payer: Self-pay | Admitting: Surgery

## 2024-03-05 ENCOUNTER — Ambulatory Visit: Admitting: Surgery

## 2024-03-05 VITALS — BP 110/73 | HR 51 | Ht 65.0 in | Wt 136.0 lb

## 2024-03-05 DIAGNOSIS — K4090 Unilateral inguinal hernia, without obstruction or gangrene, not specified as recurrent: Secondary | ICD-10-CM | POA: Diagnosis not present

## 2024-03-05 DIAGNOSIS — K7460 Unspecified cirrhosis of liver: Secondary | ICD-10-CM

## 2024-03-05 DIAGNOSIS — R188 Other ascites: Secondary | ICD-10-CM | POA: Diagnosis not present

## 2024-03-05 NOTE — Patient Instructions (Signed)
 You have chose to have your hernia repaired. This will be done by Dr. Lane at [facility].  Stop your Aspirin  for 5 days prior to surgery.   Please see your (blue) Pre-care information that you have been given today. Our surgery scheduler will call you to verify surgery date and to go over information.   You will need to arrange to be out of work for approximately 1-2 weeks and then you may return with a lifting restriction for 4 more weeks. If you have FMLA or Disability paperwork that needs to be filled out, please have your company fax your paperwork to [phone] or you may drop this by either office. This paperwork will be filled out within 3 days after your surgery has been completed.  You may have a bruise in your groin and also swelling and brusing in your testicle area. You may use ice 4-5 times daily for 15-20 minutes each time. Make sure that you place a barrier between you and the ice pack. To decrease the swelling, you may roll up a bath towel and place it vertically in between your thighs with your testicles resting on the towel. You will want to keep this area elevated as much as possible for several days following surgery.    Inguinal Hernia, Adult Muscles help keep everything in the body in its proper place. But if a weak spot in the muscles develops, something can poke through. That is called a hernia. When this happens in the lower part of the belly (abdomen), it is called an inguinal hernia. (It takes its name from a part of the body in this region called the inguinal canal.) A weak spot in the wall of muscles lets some fat or part of the small intestine bulge through. An inguinal hernia can develop at any age. Men get them more often than women. CAUSES  In adults, an inguinal hernia develops over time. It can be triggered by: Suddenly straining the muscles of the lower abdomen. Lifting heavy objects. Straining to have a bowel movement. Difficult bowel movements  (constipation) can lead to this. Constant coughing. This may be caused by smoking or lung disease. Being overweight. Being pregnant. Working at a job that requires long periods of standing or heavy lifting. Having had an inguinal hernia before. One type can be an emergency situation. It is called a strangulated inguinal hernia. It develops if part of the small intestine slips through the weak spot and cannot get back into the abdomen. The blood supply can be cut off. If that happens, part of the intestine may die. This situation requires emergency surgery. SYMPTOMS  Often, a small inguinal hernia has no symptoms. It is found when a healthcare provider does a physical exam. Larger hernias usually have symptoms.  In adults, symptoms may include: A lump in the groin. This is easier to see when the person is standing. It might disappear when lying down. In men, a lump in the scrotum. Pain or burning in the groin. This occurs especially when lifting, straining or coughing. A dull ache or feeling of pressure in the groin. Signs of a strangulated hernia can include: A bulge in the groin that becomes very painful and tender to the touch. A bulge that turns red or purple. Fever, nausea and vomiting. Inability to have a bowel movement or to pass gas. DIAGNOSIS  To decide if you have an inguinal hernia, a healthcare provider will probably do a physical examination. This will include asking  questions about any symptoms you have noticed. The healthcare provider might feel the groin area and ask you to cough. If an inguinal hernia is felt, the healthcare provider may try to slide it back into the abdomen. Usually no other tests are needed. TREATMENT  Treatments can vary. The size of the hernia makes a difference. Options include: Watchful waiting. This is often suggested if the hernia is small and you have had no symptoms. No medical procedure will be done unless symptoms develop. You will need to  watch closely for symptoms. If any occur, contact your healthcare provider right away. Surgery. This is used if the hernia is larger or you have symptoms. Open surgery. This is usually an outpatient procedure (you will not stay overnight in a hospital). An cut (incision) is made through the skin in the groin. The hernia is put back inside the abdomen. The weak area in the muscles is then repaired by herniorrhaphy or hernioplasty. Herniorrhaphy: in this type of surgery, the weak muscles are sewn back together. Hernioplasty: a patch or mesh is used to close the weak area in the abdominal wall. Laparoscopy. In this procedure, a surgeon makes small incisions. A thin tube with a tiny video camera (called a laparoscope) is put into the abdomen. The surgeon repairs the hernia with mesh by looking with the video camera and using two long instruments. HOME CARE INSTRUCTIONS  After surgery to repair an inguinal hernia: You will need to take pain medicine prescribed by your healthcare provider. Follow all directions carefully. You will need to take care of the wound from the incision. Your activity will be restricted for awhile. This will probably include no heavy lifting for several weeks. You also should not do anything too active for a few weeks. When you can return to work will depend on the type of job that you have. During watchful waiting periods, you should: Maintain a healthy weight. Eat a diet high in fiber (fruits, vegetables and whole grains). Drink plenty of fluids to avoid constipation. This means drinking enough water and other liquids to keep your urine clear or pale yellow. Do not lift heavy objects. Do not stand for long periods of time. Quit smoking. This should keep you from developing a frequent cough. SEEK MEDICAL CARE IF:  A bulge develops in your groin area. You feel pain, a burning sensation or pressure in the groin. This might be worse if you are lifting or straining. You develop  a fever of more than 100.5 F (38.1 C). SEEK IMMEDIATE MEDICAL CARE IF:  Pain in the groin increases suddenly. A bulge in the groin gets bigger suddenly and does not go down. For men, there is sudden pain in the scrotum. Or, the size of the scrotum increases. A bulge in the groin area becomes red or purple and is painful to touch. You have nausea or vomiting that does not go away. You feel your heart beating much faster than normal. You cannot have a bowel movement or pass gas. You develop a fever of more than 102.0 F (38.9 C).   This information is not intended to replace advice given to you by your health care provider. Make sure you discuss any questions you have with your health care provider.   Document Released: 11/11/2008 Document Revised: 09/17/2011 Document Reviewed: 12/27/2014 Elsevier Interactive Patient Education [employer]

## 2024-03-10 DIAGNOSIS — K7031 Alcoholic cirrhosis of liver with ascites: Secondary | ICD-10-CM | POA: Diagnosis not present

## 2024-03-10 DIAGNOSIS — R140 Abdominal distension (gaseous): Secondary | ICD-10-CM | POA: Diagnosis not present

## 2024-03-10 DIAGNOSIS — Z860101 Personal history of adenomatous and serrated colon polyps: Secondary | ICD-10-CM | POA: Diagnosis not present

## 2024-03-10 DIAGNOSIS — D689 Coagulation defect, unspecified: Secondary | ICD-10-CM | POA: Diagnosis not present

## 2024-03-10 DIAGNOSIS — E1169 Type 2 diabetes mellitus with other specified complication: Secondary | ICD-10-CM | POA: Diagnosis not present

## 2024-03-10 DIAGNOSIS — D500 Iron deficiency anemia secondary to blood loss (chronic): Secondary | ICD-10-CM | POA: Diagnosis not present

## 2024-03-10 DIAGNOSIS — K648 Other hemorrhoids: Secondary | ICD-10-CM | POA: Diagnosis not present

## 2024-03-10 DIAGNOSIS — Z83719 Family history of colon polyps, unspecified: Secondary | ICD-10-CM | POA: Diagnosis not present

## 2024-03-10 DIAGNOSIS — R011 Cardiac murmur, unspecified: Secondary | ICD-10-CM | POA: Diagnosis not present

## 2024-03-10 DIAGNOSIS — K766 Portal hypertension: Secondary | ICD-10-CM | POA: Diagnosis not present

## 2024-03-10 DIAGNOSIS — K219 Gastro-esophageal reflux disease without esophagitis: Secondary | ICD-10-CM | POA: Diagnosis not present

## 2024-03-10 DIAGNOSIS — Z9049 Acquired absence of other specified parts of digestive tract: Secondary | ICD-10-CM | POA: Diagnosis not present

## 2024-03-11 DIAGNOSIS — H2513 Age-related nuclear cataract, bilateral: Secondary | ICD-10-CM | POA: Diagnosis not present

## 2024-03-11 DIAGNOSIS — E119 Type 2 diabetes mellitus without complications: Secondary | ICD-10-CM | POA: Diagnosis not present

## 2024-03-11 DIAGNOSIS — H35362 Drusen (degenerative) of macula, left eye: Secondary | ICD-10-CM | POA: Diagnosis not present

## 2024-03-11 LAB — HM DIABETES EYE EXAM

## 2024-03-18 ENCOUNTER — Ambulatory Visit: Admit: 2024-03-18 | Admitting: Surgery

## 2024-03-18 SURGERY — HERNIORRHAPHY, INGUINAL, ROBOT-ASSISTED, LAPAROSCOPIC
Anesthesia: General | Laterality: Right

## 2024-04-14 ENCOUNTER — Telehealth: Payer: Self-pay | Admitting: Family Medicine

## 2024-04-14 ENCOUNTER — Other Ambulatory Visit: Payer: Self-pay

## 2024-04-14 MED ORDER — ONETOUCH DELICA PLUS LANCET30G MISC: 0 refills | Status: DC

## 2024-04-14 NOTE — Telephone Encounter (Signed)
 Sent in

## 2024-04-14 NOTE — Telephone Encounter (Signed)
 [facility] is asking for a new rx for one touch delica lancets.

## 2024-04-18 ENCOUNTER — Other Ambulatory Visit: Payer: Self-pay | Admitting: Family Medicine

## 2024-05-11 ENCOUNTER — Other Ambulatory Visit: Payer: Self-pay | Admitting: Gastroenterology

## 2024-05-11 DIAGNOSIS — K7031 Alcoholic cirrhosis of liver with ascites: Secondary | ICD-10-CM

## 2024-05-12 ENCOUNTER — Ambulatory Visit
Admission: RE | Admit: 2024-05-12 | Discharge: 2024-05-12 | Disposition: A | Discharge status: Home / Self Care | Source: Ambulatory Visit | Attending: Gastroenterology | Admitting: Gastroenterology

## 2024-05-12 DIAGNOSIS — K7031 Alcoholic cirrhosis of liver with ascites: Secondary | ICD-10-CM | POA: Insufficient documentation

## 2024-05-12 MED ORDER — LIDOCAINE HCL (PF) 1 % IJ SOLN
10.0000 mL | Freq: Once | INTRAMUSCULAR | Status: AC
  Administered 2024-05-12: 10 mL via INTRADERMAL
  Filled 2024-05-12: qty 10

## 2024-05-12 NOTE — Procedures (Signed)
 PROCEDURE SUMMARY:  Successful image-guided paracentesis from the right abdomen.  Yielded 4.3 liters of clear yellow fluid.  No immediate complications.  EBL: zero Patient tolerated well.   Specimen not sent for labs.  Please see imaging section of Epic for full dictation.  Ruhama Lehew B Warren Lindahl NP 05/12/2024 9:48 AM

## 2024-05-21 DIAGNOSIS — K7031 Alcoholic cirrhosis of liver with ascites: Secondary | ICD-10-CM | POA: Diagnosis not present

## 2024-06-06 ENCOUNTER — Other Ambulatory Visit: Payer: Self-pay | Admitting: Family Medicine

## 2024-06-06 DIAGNOSIS — Z794 Long term (current) use of insulin: Secondary | ICD-10-CM

## 2024-06-08 ENCOUNTER — Encounter: Payer: Self-pay | Admitting: Family Medicine

## 2024-06-08 ENCOUNTER — Other Ambulatory Visit: Payer: Self-pay | Admitting: Family Medicine

## 2024-06-08 ENCOUNTER — Ambulatory Visit: Admitting: Family Medicine

## 2024-06-08 VITALS — BP 118/81 | HR 85 | Ht 65.0 in | Wt 144.0 lb

## 2024-06-08 DIAGNOSIS — Z794 Long term (current) use of insulin: Secondary | ICD-10-CM

## 2024-06-08 DIAGNOSIS — E1165 Type 2 diabetes mellitus with hyperglycemia: Secondary | ICD-10-CM

## 2024-06-08 DIAGNOSIS — H6092 Unspecified otitis externa, left ear: Secondary | ICD-10-CM

## 2024-06-08 DIAGNOSIS — K7031 Alcoholic cirrhosis of liver with ascites: Secondary | ICD-10-CM | POA: Diagnosis not present

## 2024-06-08 DIAGNOSIS — N6321 Unspecified lump in the left breast, upper outer quadrant: Secondary | ICD-10-CM

## 2024-06-08 LAB — POCT GLYCOSYLATED HEMOGLOBIN (HGB A1C)
Est. average glucose Bld gHb Est-mCnc: 123
Hemoglobin A1C: 5.9 % — AB (ref 4.0–5.6)

## 2024-06-08 MED ORDER — NEOMYCIN-POLYMYXIN-HC 3.5-10000-1 OT SOLN: 3.0000 [drp] | Freq: Four times a day (QID) | OTIC | 0 refills | Status: AC

## 2024-06-08 NOTE — Progress Notes (Signed)
 Established patient visit   Patient: [name]   DOB: 1950/10/24   73 y.o. Male  MRN: [id number] Visit Date: 06/08/2024  Today's healthcare provider: Nancyann Perry, MD   Chief Complaint  Patient presents with   Medical Management of Chronic Issues   Mass    Chest lump, left side- notice it 3 weeks ago   Subjective    Discussed the use of AI scribe software for clinical note transcription with the patient, who gave verbal consent to proceed.  History of Present Illness   [name] is a 73 year old male who presents with a sore lump on his chest.  He has a sore lump on the left side of his chest that has been present for about three weeks. A similar occurrence happened about a year ago, which was attributed to spironolactone  use. The lump is described as sore but improving.  He has a history of fluid retention and mentions having approximately two gallons of fluid in his abdomen, which exacerbates his hernia. He is scheduled to have fluid drawn soon and is currently taking spironolactone , which was previously discontinued but restarted due to worsening swelling.  He manages his diabetes with insulin  and uses test strips for monitoring his blood sugar, although he was prescribed a continuous glucose monitor that he has not used. He needs more test strips. His A1c is reported to be 5.9, and he has experienced hypoglycemic episodes due to excessive insulin  use.  He has intermittent issues with his left ear, feeling as though there is water in it. He has a sore inside the ear canal, which has been bothering him off and on for quite a while.  No alcohol consumption.     Lab Results  Component Value Date   HGBA1C 5.9 (A) 06/08/2024   HGBA1C 6.7 (H) 02/07/2024   HGBA1C 5.7 (H) 10/28/2023   Lab Results  Component Value Date   NA 133 (L) 02/07/2024   K 4.0 02/07/2024   CREATININE 0.75 (L) 02/07/2024   EGFR 95 02/07/2024   GLUCOSE 212 (H) 02/07/2024      Medications: Outpatient Medications Prior to Visit  Medication Sig   Accu-Chek FastClix Lancets MISC USE TO CHECK GLUCOSE BEFORE MEALS AND AS NEEDED   acetaminophen  (TYLENOL ) 500 MG tablet Take 1,000 mg by mouth every 6 (six) hours as needed.   allopurinol  (ZYLOPRIM ) 100 MG tablet TAKE 1 TABLET BY MOUTH EVERY DAY   aspirin  81 MG chewable tablet Chew 1 tablet (81 mg total) by mouth daily.   BD PEN NEEDLE MINI ULTRAFINE 31G X 5 MM MISC USE 1 TO CHECK BLOOD SUGAR 3 TIMES DAILY   carvedilol  (COREG ) 3.125 MG tablet TAKE 1 TABLET BY MOUTH 2 TIMES DAILY.   Continuous Blood Gluc Receiver (FREESTYLE LIBRE 14 DAY READER) DEVI 1 Device by Does not apply route every 14 (fourteen) days. for type insulin  dependent type 2 diabetes   Continuous Glucose Sensor (FREESTYLE LIBRE 3 PLUS SENSOR) MISC Change sensor every 15 days.   eplerenone  (INSPRA ) 25 MG tablet TAKE 1 TABLET BY MOUTH TWICE A DAY   folic acid  (FOLVITE ) 1 MG tablet Take 1 tablet (1 mg total) by mouth daily.   furosemide  (LASIX ) 20 MG tablet TAKE 2 TABLETS BY MOUTH 2 TIMES DAILY.   insulin  aspart (NOVOLOG  FLEXPEN) 100 UNIT/ML FlexPen 0-9 Units, Subcutaneous, 3 times daily with meals CBG < 70: Implement Hypoglycemia measures CBG 70 - 120: 0 units CBG 121 -  150: 1 unit CBG 151 - 200: 2 units CBG 201 - 250: 3 units CBG 251 - 300: 5 units CBG 301 - 350: 7 units CBG 351 - 400: 9 units CBG > 400: call MD   insulin  degludec (TRESIBA  FLEXTOUCH) 100 UNIT/ML FlexTouch Pen INJECT UP TO 20 UNITS UNDER THE SKIN EVERY DAY AT BEDTIME. NEED APPT FOR REFILLS.   Iron , Ferrous Sulfate , 325 (65 Fe) MG TABS Take 325 mg by mouth daily.   Lancets (ONETOUCH DELICA PLUS LANCET30G) MISC USE TO CHECK GLUCOSE BEFORE MEALS AND AS NEEDED   levETIRAcetam  (KEPPRA ) 750 MG tablet Take 2 tablets (1,500 mg total) by mouth 2 (two) times daily.   lidocaine  (LIDODERM ) 5 % Place 1 patch onto the skin daily. Remove & Discard patch within 12 hours or as directed by MD   lisinopril   (ZESTRIL ) 5 MG tablet Take 5 mg by mouth daily.   omeprazole  (PRILOSEC) 40 MG capsule TAKE 1 CAPSULE BY MOUTH EVERY DAY   ONETOUCH VERIO test strip USE TO CHECK SUGAR THREE TIMES DAILY FOR INSULIN  DEPENDENT TYPE 2 DIABETES   promethazine  (PHENERGAN ) 25 MG tablet Take 1 tablet (25 mg total) by mouth every 8 (eight) hours as needed.   pyridOXINE (VITAMIN B-6) 50 MG tablet Take 50 mg by mouth daily.   sertraline  (ZOLOFT ) 100 MG tablet TAKE 1 TABLET BY MOUTH EVERY DAY   spironolactone  (ALDACTONE ) 100 MG tablet Take 100 mg by mouth daily.   thiamine  (VITAMIN B-1) 100 MG tablet Take 1 tablet (100 mg total) by mouth daily.   traMADol  (ULTRAM ) 50 MG tablet TAKE 1 TABLET BY MOUTH EVERY 8 HOURS AS NEEDED   Facility-Administered Medications Prior to Visit  Medication Dose Route Frequency Provider   albumin  human 25 % solution 25 g  25 g Intravenous Once    Review of Systems     Objective    BP 118/81 (BP Location: Left Arm, Patient Position: Sitting, Cuff Size: Normal)   Pulse 85   Ht 5' 5 (1.651 m)   Wt 144 lb (65.3 kg)   SpO2 99%   BMI 23.96 kg/m   Physical Exam   General: Appearance:    Well developed, well nourished male in no acute distress  Ears:   Slightly inflamed left ear canal.   Eyes:    PERRL, conjunctiva/corneas clear, EOM's intact       Lungs:     Clear to auscultation bilaterally, respirations unlabored  Heart:    Normal heart rate. Normal rhythm.  2/6 harsh, crescendo-decrescendo, systolic murmur at right upper sternal border   MS:   All extremities are intact.    Breast:   Grape sized firm slightly tender nodule left outer breast.         Results for orders placed or performed in visit on 06/08/24  POCT glycosylated hemoglobin (Hb A1C)  Result Value Ref Range   Hemoglobin A1C 5.9 (A) 4.0 - 5.6 %   Est. average glucose Bld gHb Est-mCnc 123      Assessment & Plan        Lump in left breast, upper outer quadrant Lump present for three weeks, sore but improving.  Previous similar lump evaluated with mammography and ultrasound in November 2023 was c/with gynecomastia thought secondary to spironolactone . Improved when spironolactone  was changed to eplerenone , but has since been restarted on spironolactone  by GI due to worsening ascites.  Differential diagnosis favors medication side effect. - Ordered ultrasound of left breast. - Sent ultrasound results  to Dr. Onita.  Type 2 diabetes mellitus A1c well-controlled at 5.9. No recent hypoglycemia or hyperglycemia. Continues insulin  and glucose monitoring with test strips. Not using prescribed arm device. - Checked A1c level.  Irritation of left external ear canal Intermittent irritation with possible wax buildup. No infection signs. - Prescribed cortisporing ear drops.     Return in about 4 months (around 10/07/2024) for Diabetes.     Nancyann Perry, MD  [facility] [phone] (phone) [phone] (fax)  [facility]

## 2024-06-08 NOTE — Patient Instructions (Signed)
 Please review the attached list of medications and notify my office if there are any errors.   Ler Dr. Onita know that you have swelling in your left breast called gynecomastia which is a side effect of spironolactone 

## 2024-06-09 ENCOUNTER — Other Ambulatory Visit: Payer: Self-pay | Admitting: Gastroenterology

## 2024-06-09 DIAGNOSIS — K7031 Alcoholic cirrhosis of liver with ascites: Secondary | ICD-10-CM

## 2024-06-10 ENCOUNTER — Ambulatory Visit
Admission: RE | Admit: 2024-06-10 | Discharge: 2024-06-10 | Disposition: A | Source: Ambulatory Visit | Attending: Gastroenterology

## 2024-06-10 ENCOUNTER — Encounter: Payer: Self-pay | Admitting: Gastroenterology

## 2024-06-10 ENCOUNTER — Other Ambulatory Visit: Payer: Self-pay | Admitting: Gastroenterology

## 2024-06-10 ENCOUNTER — Ambulatory Visit: Admission: RE | Admit: 2024-06-10 | Discharge: 2024-06-10 | Attending: Gastroenterology

## 2024-06-10 DIAGNOSIS — Z9049 Acquired absence of other specified parts of digestive tract: Secondary | ICD-10-CM | POA: Insufficient documentation

## 2024-06-10 DIAGNOSIS — R161 Splenomegaly, not elsewhere classified: Secondary | ICD-10-CM | POA: Diagnosis not present

## 2024-06-10 DIAGNOSIS — K7031 Alcoholic cirrhosis of liver with ascites: Secondary | ICD-10-CM | POA: Diagnosis not present

## 2024-06-10 DIAGNOSIS — K4090 Unilateral inguinal hernia, without obstruction or gangrene, not specified as recurrent: Secondary | ICD-10-CM | POA: Insufficient documentation

## 2024-06-10 DIAGNOSIS — R188 Other ascites: Secondary | ICD-10-CM | POA: Diagnosis not present

## 2024-06-10 DIAGNOSIS — K7460 Unspecified cirrhosis of liver: Secondary | ICD-10-CM | POA: Diagnosis not present

## 2024-06-10 LAB — PROTEIN, PLEURAL OR PERITONEAL FLUID: Total protein, fluid: <3 g/dL

## 2024-06-10 LAB — BODY FLUID CELL COUNT WITH DIFFERENTIAL
Eos, Fluid: 0 %
Lymphs, Fluid: 39 %
Monocyte-Macrophage-Serous Fluid: 60 %
Neutrophil Count, Fluid: 1 %
Total Nucleated Cell Count, Fluid: 249 uL

## 2024-06-10 LAB — LACTATE DEHYDROGENASE, PLEURAL OR PERITONEAL FLUID: LD, Fluid: 66 U/L — ABNORMAL HIGH (ref 3–23)

## 2024-06-10 LAB — ALBUMIN, PLEURAL OR PERITONEAL FLUID: Albumin, Fluid: <1.5 g/dL

## 2024-06-10 LAB — PATHOLOGIST SMEAR REVIEW

## 2024-06-10 MED ORDER — LIDOCAINE 1 % OPTIME INJ - NO CHARGE
20.0000 mL | Freq: Once | INTRAMUSCULAR | Status: AC
  Administered 2024-06-10: 20 mL via INTRADERMAL
  Filled 2024-06-10: qty 20

## 2024-06-10 MED ORDER — IOHEXOL 300 MG/ML  SOLN: 100.0000 mL | Freq: Once | INTRAMUSCULAR | Status: DC | PRN

## 2024-06-10 MED ORDER — IOHEXOL 300 MG/ML  SOLN
80.0000 mL | Freq: Once | INTRAMUSCULAR | Status: AC | PRN
  Administered 2024-06-10: 80 mL via INTRAVENOUS

## 2024-06-10 NOTE — Procedures (Signed)
 PROCEDURE SUMMARY:  Successful ultrasound guided paracentesis from the right lower quadrant.  Yielded 3.9 L of dark red fluid.  No immediate complications.  The patient tolerated the procedure well.   Specimen sent for labs.  EBL < 5mL  The patient has required >/=2 paracenteses in a 30 day period and a screening evaluation by the [facility] has been arranged.  ** Dr. Onita aware of fluid color. Additional labs ordered.   Warren Dais, AGACNP-BC 06/10/2024, 12:31 PM

## 2024-06-14 LAB — BODY FLUID CULTURE W GRAM STAIN: Culture: NO GROWTH

## 2024-06-17 ENCOUNTER — Inpatient Hospital Stay: Admission: RE | Admit: 2024-06-17 | Discharge: 2024-06-17 | Attending: Family Medicine | Admitting: Family Medicine

## 2024-06-17 DIAGNOSIS — N6321 Unspecified lump in the left breast, upper outer quadrant: Secondary | ICD-10-CM

## 2024-06-18 ENCOUNTER — Ambulatory Visit: Payer: Self-pay | Admitting: Family Medicine

## 2024-07-24 ENCOUNTER — Other Ambulatory Visit: Payer: Self-pay | Admitting: Family Medicine

## 2024-07-24 DIAGNOSIS — Z794 Long term (current) use of insulin: Secondary | ICD-10-CM

## 2024-10-06 ENCOUNTER — Ambulatory Visit: Admitting: Dermatology

## 2024-10-07 ENCOUNTER — Ambulatory Visit: Admitting: Family Medicine

## 2025-02-01 ENCOUNTER — Ambulatory Visit: Admitting: Neurology

## 2025-03-02 ENCOUNTER — Ambulatory Visit
